# Patient Record
Sex: Female | Born: 1970 | Race: White | Hispanic: No | Marital: Single | Smoking: Current every day smoker
Health system: Southern US, Community
[De-identification: ages and names within clinical notes are randomized; demographics above are authoritative.]

## PROBLEM LIST (undated history)

## (undated) DIAGNOSIS — Z972 Presence of dental prosthetic device (complete) (partial): Secondary | ICD-10-CM

## (undated) DIAGNOSIS — Z9989 Dependence on other enabling machines and devices: Secondary | ICD-10-CM

## (undated) DIAGNOSIS — F329 Major depressive disorder, single episode, unspecified: Secondary | ICD-10-CM

## (undated) DIAGNOSIS — F419 Anxiety disorder, unspecified: Secondary | ICD-10-CM

## (undated) DIAGNOSIS — I10 Essential (primary) hypertension: Secondary | ICD-10-CM

## (undated) DIAGNOSIS — M1990 Unspecified osteoarthritis, unspecified site: Secondary | ICD-10-CM

## (undated) DIAGNOSIS — S82892A Other fracture of left lower leg, initial encounter for closed fracture: Secondary | ICD-10-CM

## (undated) DIAGNOSIS — R519 Headache, unspecified: Secondary | ICD-10-CM

## (undated) DIAGNOSIS — K219 Gastro-esophageal reflux disease without esophagitis: Secondary | ICD-10-CM

## (undated) DIAGNOSIS — J449 Chronic obstructive pulmonary disease, unspecified: Secondary | ICD-10-CM

## (undated) DIAGNOSIS — B182 Chronic viral hepatitis C: Secondary | ICD-10-CM

## (undated) DIAGNOSIS — R0600 Dyspnea, unspecified: Secondary | ICD-10-CM

## (undated) DIAGNOSIS — F32A Depression, unspecified: Secondary | ICD-10-CM

## (undated) DIAGNOSIS — F1910 Other psychoactive substance abuse, uncomplicated: Secondary | ICD-10-CM

## (undated) DIAGNOSIS — M1610 Unilateral primary osteoarthritis, unspecified hip: Secondary | ICD-10-CM

## (undated) DIAGNOSIS — N63 Unspecified lump in unspecified breast: Secondary | ICD-10-CM

## (undated) DIAGNOSIS — F319 Bipolar disorder, unspecified: Secondary | ICD-10-CM

## (undated) DIAGNOSIS — J189 Pneumonia, unspecified organism: Secondary | ICD-10-CM

## (undated) DIAGNOSIS — J45909 Unspecified asthma, uncomplicated: Secondary | ICD-10-CM

## (undated) DIAGNOSIS — H9190 Unspecified hearing loss, unspecified ear: Secondary | ICD-10-CM

## (undated) HISTORY — PX: TUBAL LIGATION: SHX77

---

## 2004-10-16 ENCOUNTER — Emergency Department: Payer: Self-pay | Admitting: Emergency Medicine

## 2005-03-18 ENCOUNTER — Emergency Department: Payer: Self-pay | Admitting: Unknown Physician Specialty

## 2005-10-24 ENCOUNTER — Emergency Department: Payer: Self-pay | Admitting: Emergency Medicine

## 2005-10-24 IMAGING — CR DG ANKLE COMPLETE 3+V*L*
1 series · 5 of 5 positions shown · non-contrast
Comparison: none

REASON FOR EXAM: INJURY
COMMENTS:  LMP: Now

[Series 1: view not recorded · 0.17mm/px · 5 of 5 slices shown]
[im 1/5]
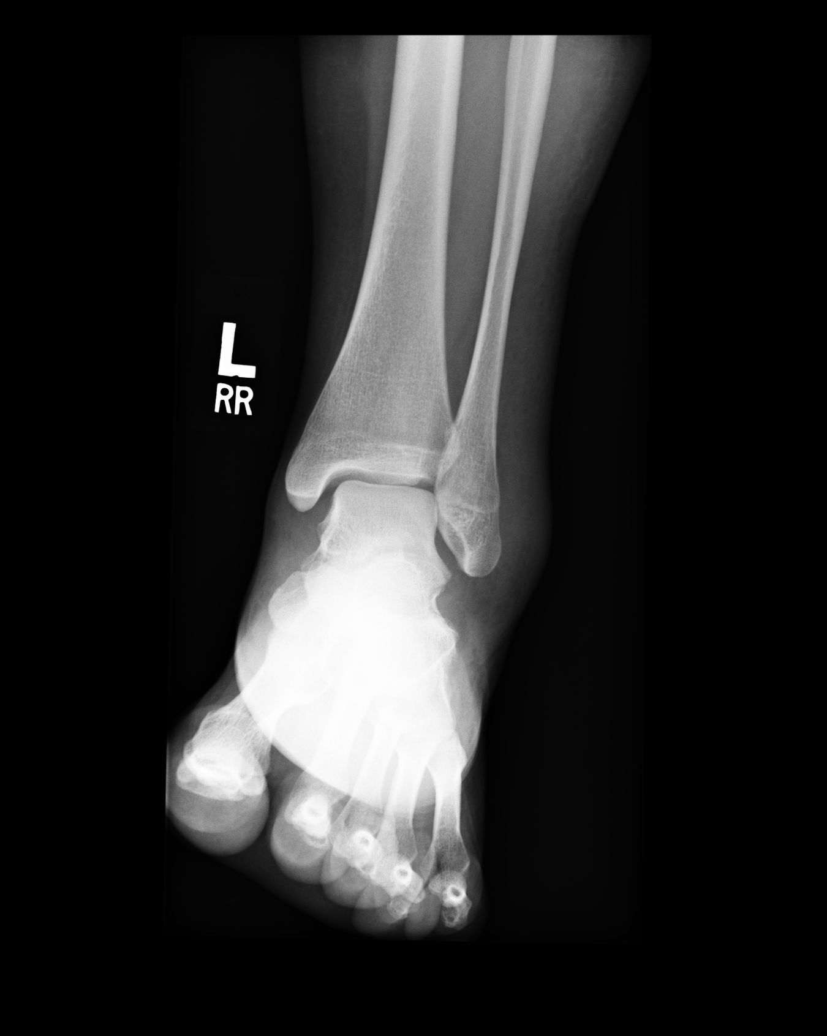
[im 2/5]
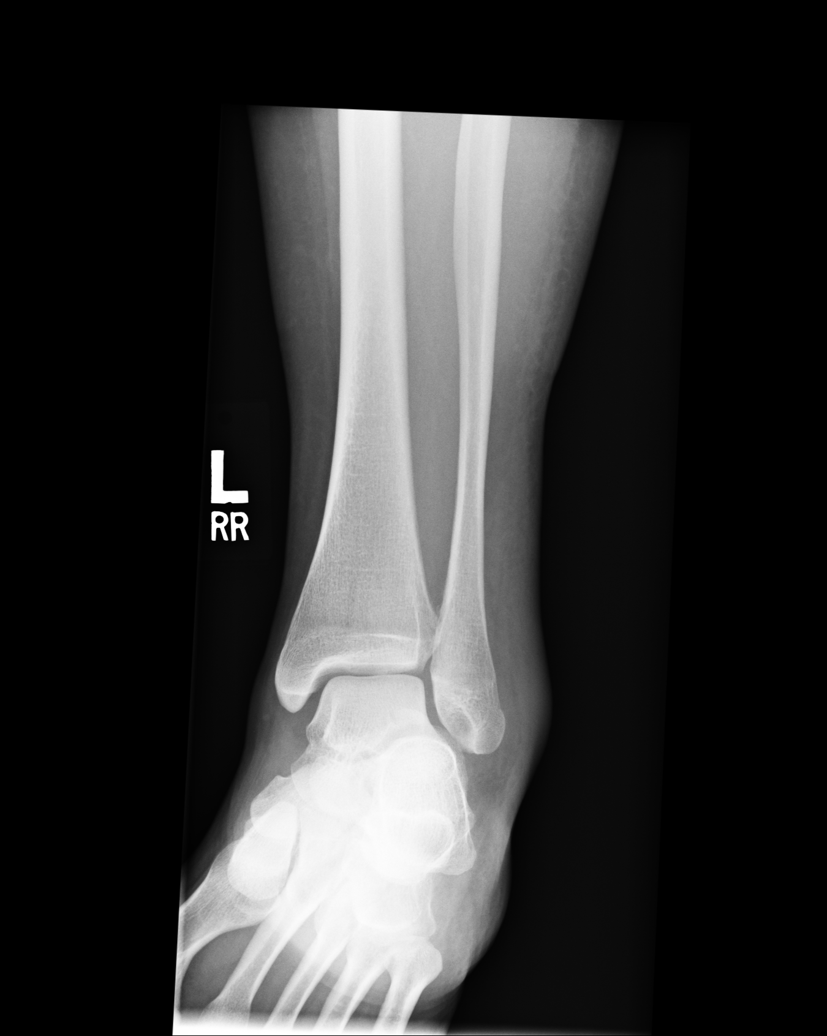
[im 3/5]
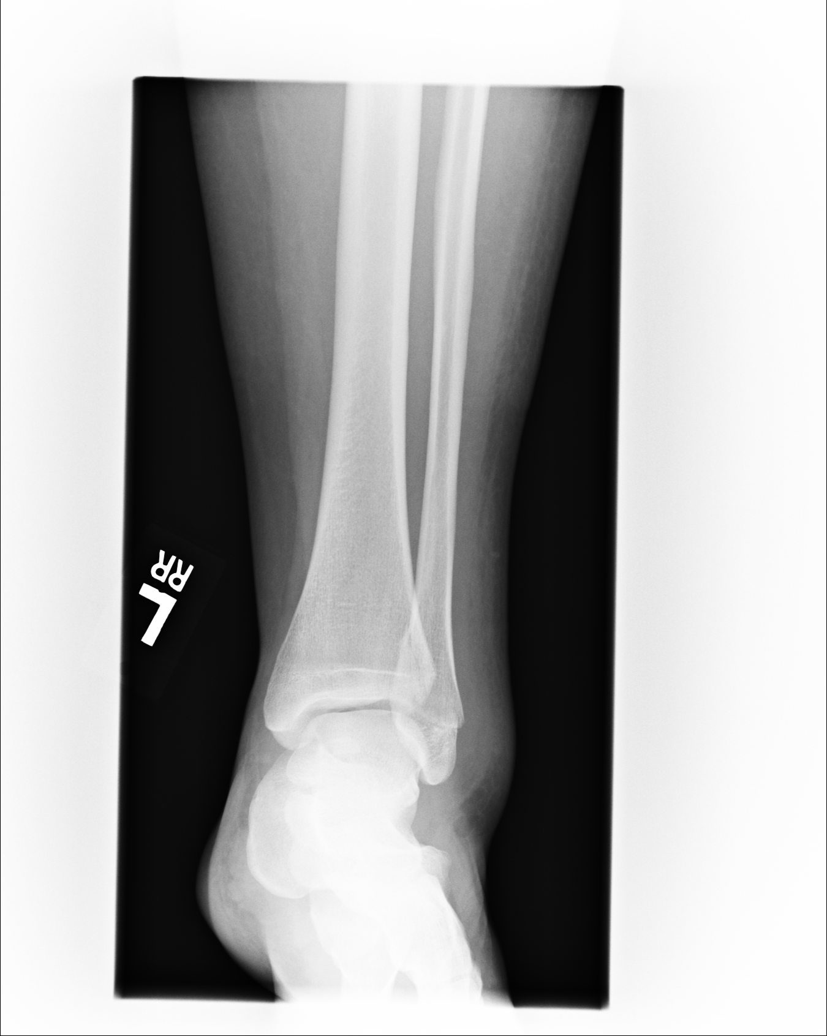
[im 4/5]
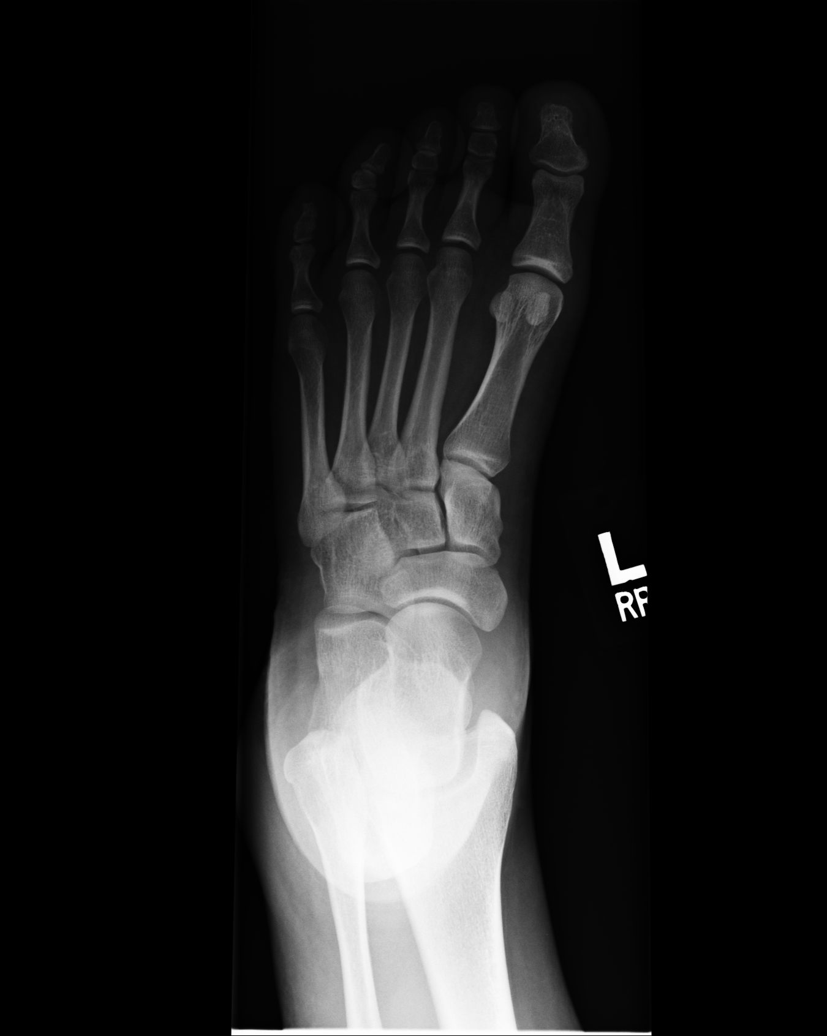
[im 5/5]
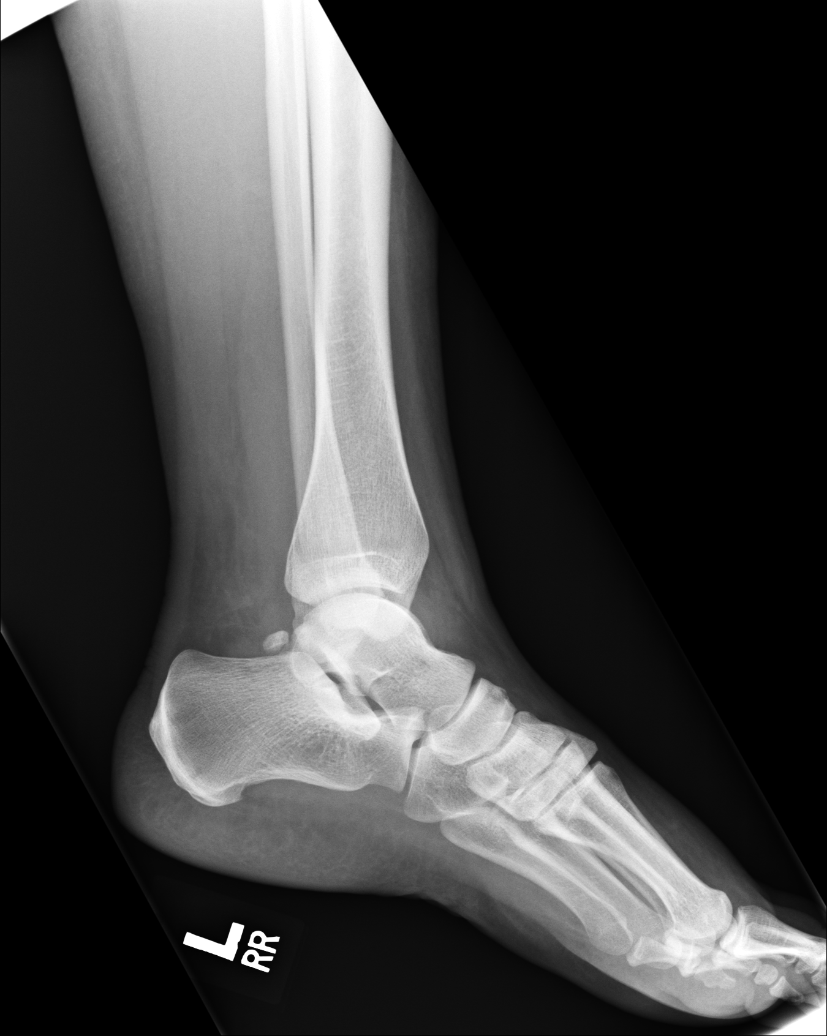

[5 of 5 positions shown; findings below may reference images not displayed]

PROCEDURE:     DXR - DXR ANKLE LEFT COMPLETE  - [DATE]  [DATE]

RESULT:          Three views of the LEFT ankle were obtained.

There is a minimally displaced oblique fracture of the distal LEFT fibula.
No other fractures are seen.  The ankle mortise is well maintained.  There
is prominent soft tissue swelling about the ankle laterally.
IMPRESSION: Fracture of the distal LEFT fibula, minimally
displaced.

## 2007-12-09 ENCOUNTER — Emergency Department: Payer: Self-pay | Admitting: Emergency Medicine

## 2011-11-25 LAB — COMPREHENSIVE METABOLIC PANEL
BUN: 6 mg/dL — ABNORMAL LOW (ref 7–18)
Chloride: 107 mmol/L (ref 98–107)
Co2: 25 mmol/L (ref 21–32)
Creatinine: 0.7 mg/dL (ref 0.60–1.30)
EGFR (African American): >60
EGFR (Non-African Amer.): >60
Potassium: 4 mmol/L (ref 3.5–5.1)
SGOT(AST): 18 U/L (ref 15–37)
SGPT (ALT): 22 U/L (ref 12–78)

## 2011-11-25 LAB — DRUG SCREEN, URINE
Barbiturates, Ur Screen: NEGATIVE (ref ?–200)
Cannabinoid 50 Ng, Ur ~~LOC~~: NEGATIVE (ref ?–50)
Cocaine Metabolite,Ur ~~LOC~~: POSITIVE (ref ?–300)
Methadone, Ur Screen: NEGATIVE (ref ?–300)
Phencyclidine (PCP) Ur S: NEGATIVE (ref ?–25)
Tricyclic, Ur Screen: NEGATIVE (ref ?–1000)

## 2011-11-25 LAB — URINALYSIS, COMPLETE
Bacteria: NONE SEEN
Bilirubin,UR: NEGATIVE
Glucose,UR: NEGATIVE mg/dL (ref 0–75)
Protein: NEGATIVE
Transitional Epi: <1
WBC UR: 32 /HPF (ref 0–5)

## 2011-11-25 LAB — PREGNANCY, URINE: Pregnancy Test, Urine: NEGATIVE m[IU]/mL

## 2011-11-25 LAB — TSH: Thyroid Stimulating Horm: 5.36 u[IU]/mL — ABNORMAL HIGH

## 2011-11-25 LAB — CBC
HCT: 40.4 % (ref 35.0–47.0)
HGB: 13.4 g/dL (ref 12.0–16.0)
MCHC: 33.2 g/dL (ref 32.0–36.0)
Platelet: 333 10*3/uL (ref 150–440)
RBC: 5.25 10*6/uL — ABNORMAL HIGH (ref 3.80–5.20)
RDW: 17 % — ABNORMAL HIGH (ref 11.5–14.5)
WBC: 10.2 10*3/uL (ref 3.6–11.0)

## 2011-11-25 LAB — ETHANOL
Ethanol %: <0.003 % (ref 0.000–0.080)
Ethanol: <3 mg/dL

## 2011-11-25 LAB — SALICYLATE LEVEL: Salicylates, Serum: 3.9 mg/dL — ABNORMAL HIGH

## 2011-11-25 LAB — ACETAMINOPHEN LEVEL: Acetaminophen: <2 ug/mL

## 2011-11-26 ENCOUNTER — Inpatient Hospital Stay: Payer: Self-pay | Admitting: Psychiatry

## 2011-12-08 IMAGING — CR CERVICAL SPINE - 2-3 VIEW
1 series · 4 of 4 positions shown · non-contrast
Comparison: None.

CLINICAL DATA: Pain

EXAM:
CERVICAL SPINE - 2-3 VIEW

[Series 1: w cervical spine lat · 0.14mm/px · 4 of 4 slices shown]
[im 1/4]
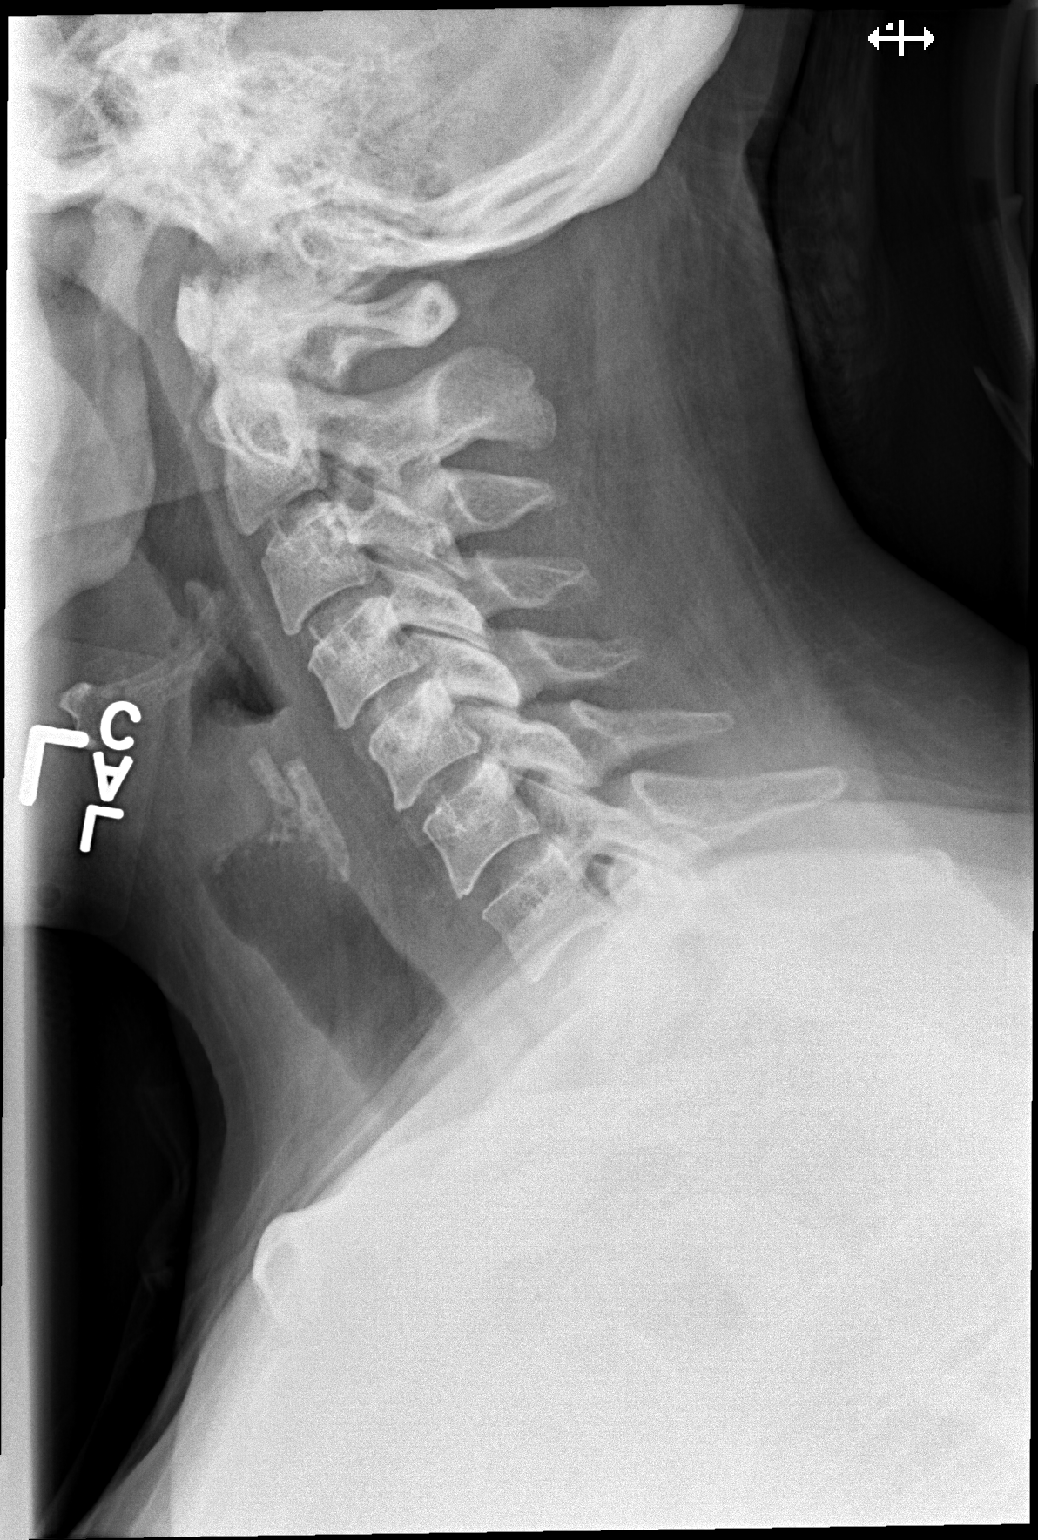
[im 2/4]
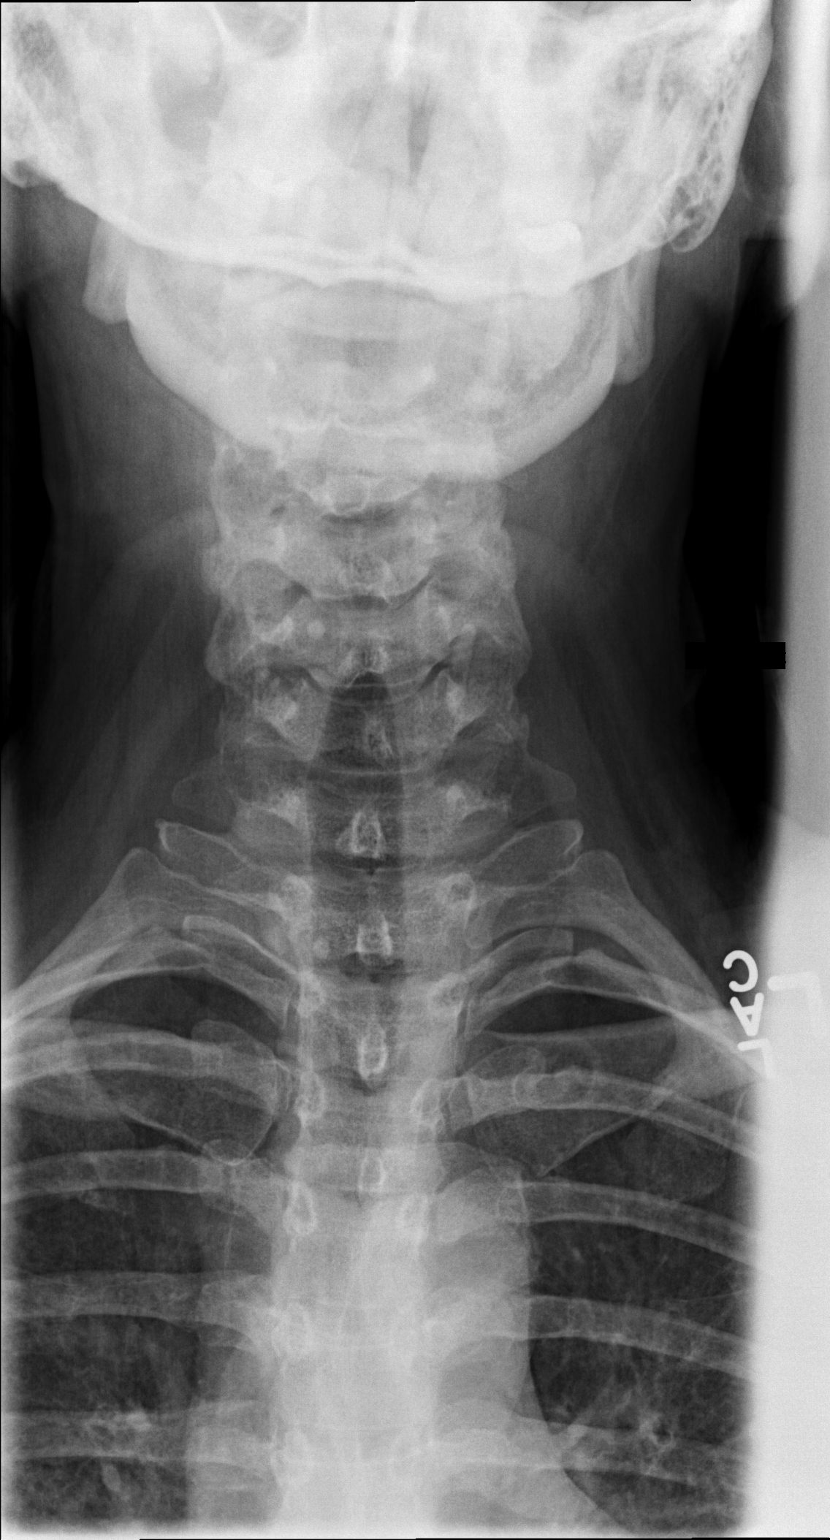
[im 3/4]
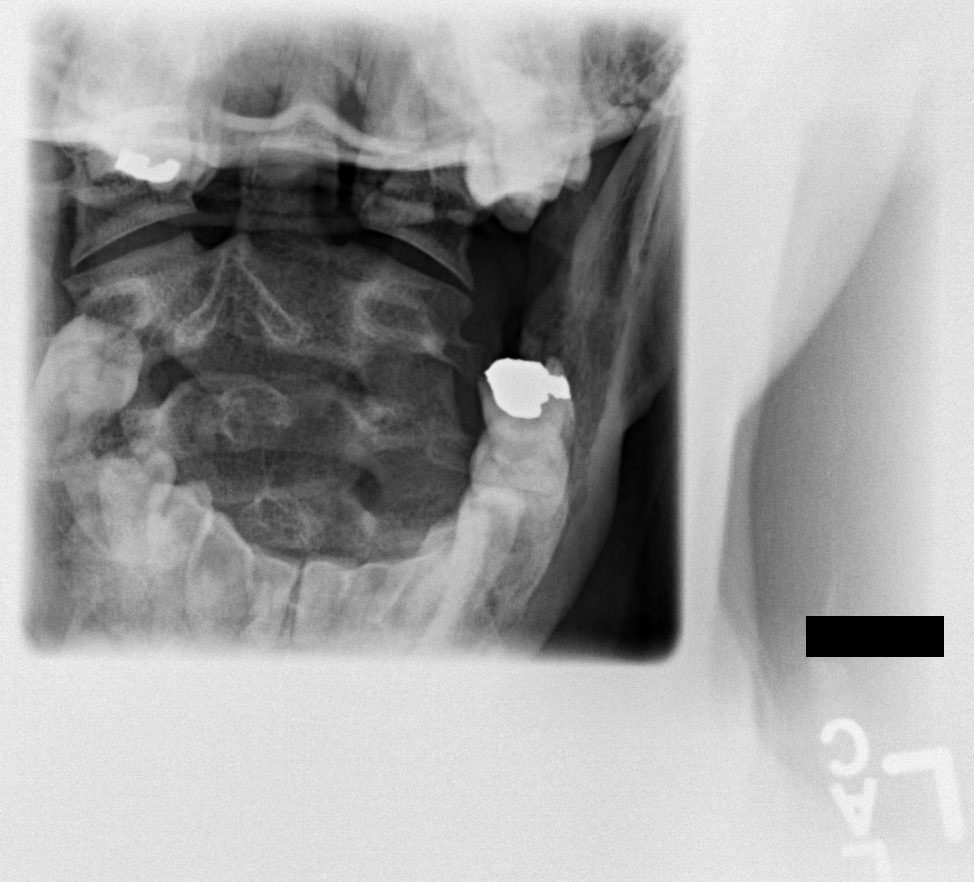
[im 4/4]
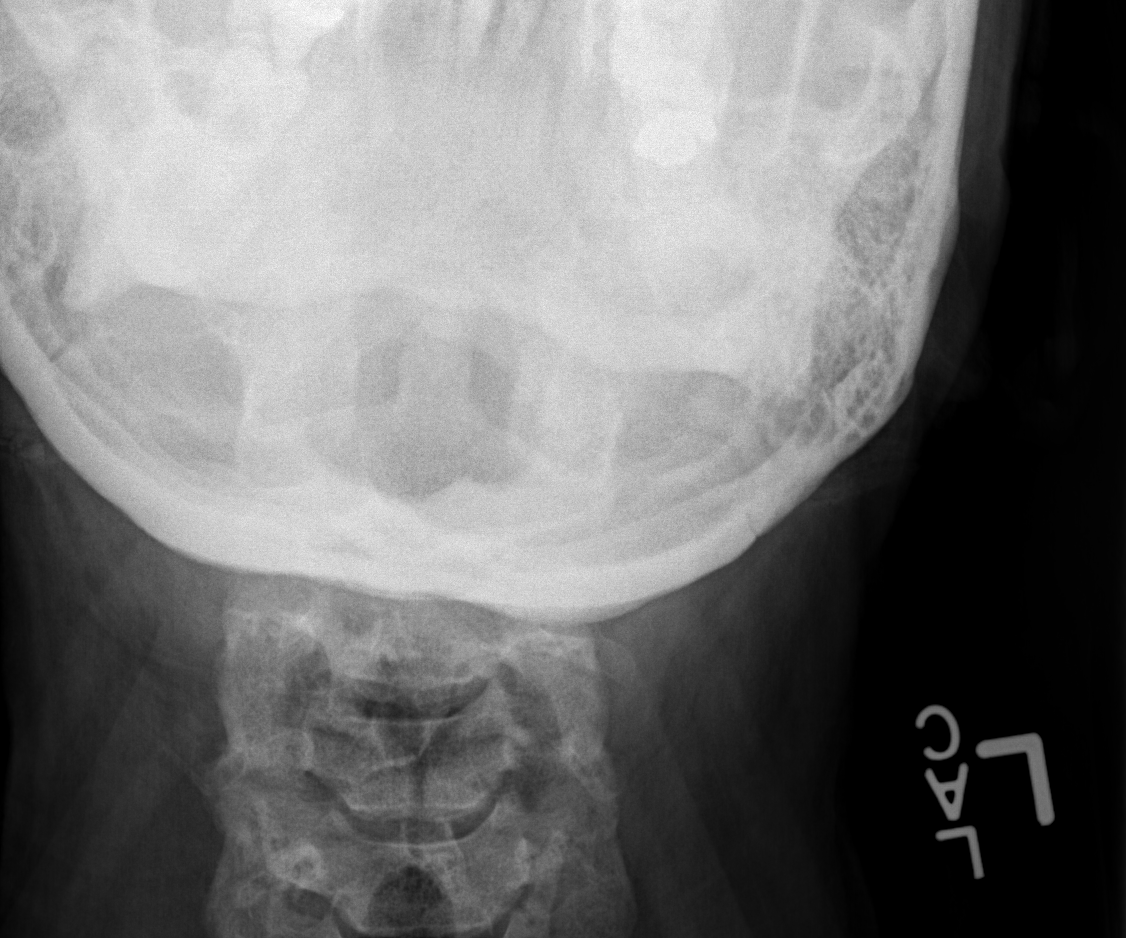

[4 of 4 positions shown; findings below may reference images not displayed]

FINDINGS: No fracture or malalignment. Prevertebral soft tissues are normal.
Disc spaces well maintained. Cervicothoracic junction normal.
IMPRESSION: No acute bony abnormality.

## 2012-10-08 ENCOUNTER — Emergency Department: Payer: Self-pay | Admitting: Emergency Medicine

## 2012-10-08 LAB — COMPREHENSIVE METABOLIC PANEL
Alkaline Phosphatase: 84 U/L (ref 50–136)
Anion Gap: 5 — ABNORMAL LOW (ref 7–16)
BUN: 7 mg/dL (ref 7–18)
Bilirubin,Total: 0.3 mg/dL (ref 0.2–1.0)
Chloride: 108 mmol/L — ABNORMAL HIGH (ref 98–107)
Creatinine: 0.59 mg/dL — ABNORMAL LOW (ref 0.60–1.30)
EGFR (African American): >60
EGFR (Non-African Amer.): >60
Osmolality: 273 (ref 275–301)
SGPT (ALT): 16 U/L (ref 12–78)
Sodium: 138 mmol/L (ref 136–145)

## 2012-10-08 LAB — CBC WITH DIFFERENTIAL/PLATELET
Basophil #: 0.1 10*3/uL (ref 0.0–0.1)
Basophil %: 0.6 %
Eosinophil #: 0.2 10*3/uL (ref 0.0–0.7)
HCT: 37.4 % (ref 35.0–47.0)
MCH: 24.9 pg — ABNORMAL LOW (ref 26.0–34.0)
MCHC: 32.7 g/dL (ref 32.0–36.0)
Monocyte #: 0.8 x10 3/mm (ref 0.2–0.9)
Monocyte %: 7.3 %
Neutrophil #: 8.2 10*3/uL — ABNORMAL HIGH (ref 1.4–6.5)
Neutrophil %: 71.8 %
RDW: 18.4 % — ABNORMAL HIGH (ref 11.5–14.5)
WBC: 11.5 10*3/uL — ABNORMAL HIGH (ref 3.6–11.0)

## 2012-10-08 IMAGING — CR DG LUMBAR SPINE AP/LAT/OBLIQUES W/ FLEX AND EXT
1 series · 5 of 5 positions shown · non-contrast
Comparison: none

REASON FOR EXAM: low back pain gradually wosening
COMMENTS:

PROCEDURE:     DXR - DXR LUMBAR SPINE WITH OBLIQUES  - [DATE]  [DATE]
RESULT:     Lumbar spine series dated [DATE]

[Series 1: t lumbar spine ap · 0.14mm/px · 5 of 5 slices shown]
[im 1/5]
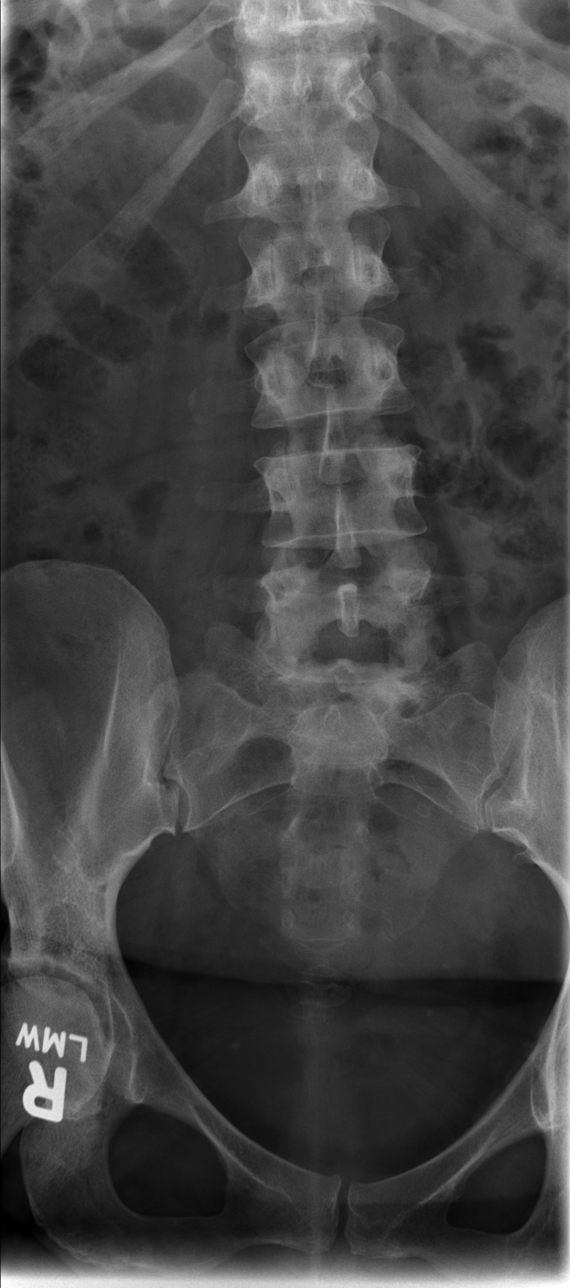
[im 2/5]
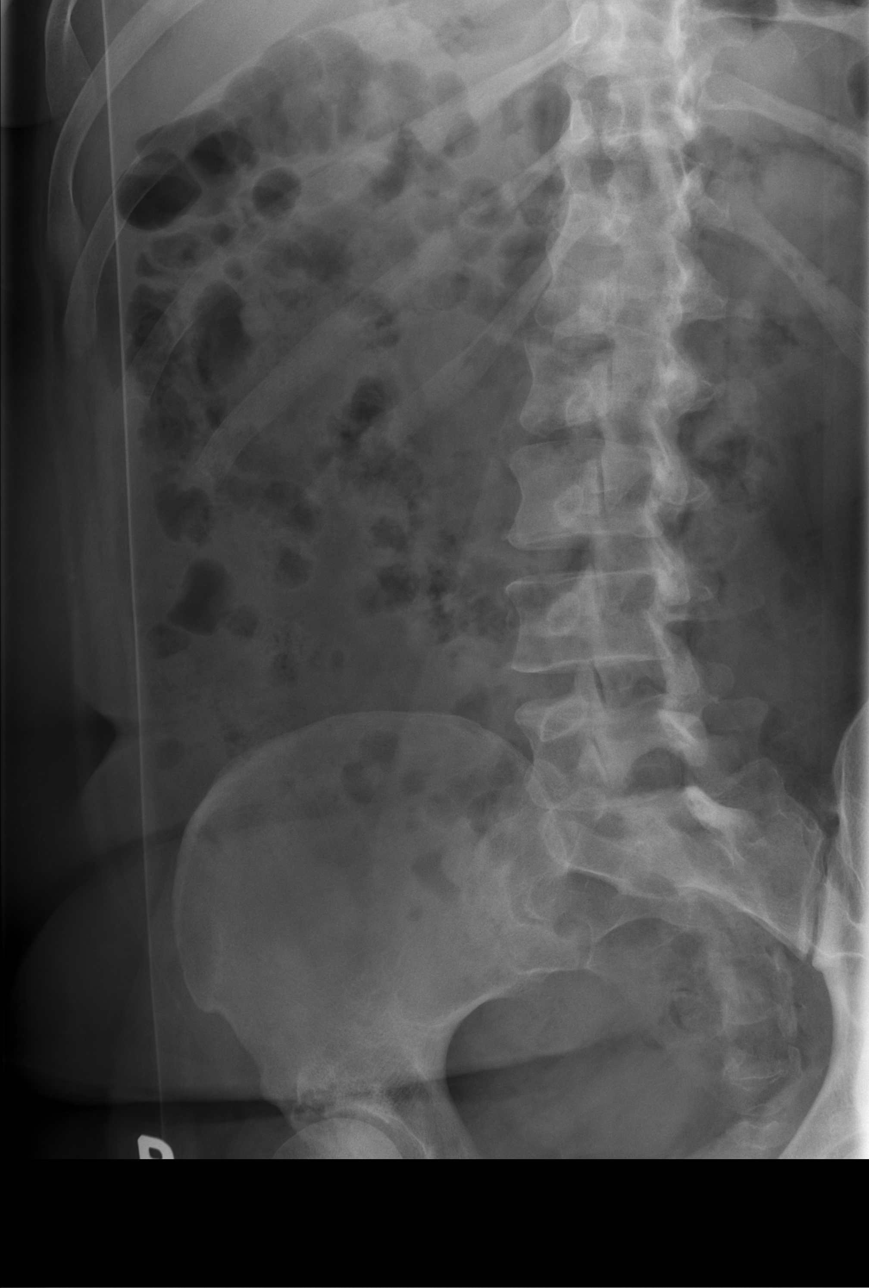
[im 3/5]
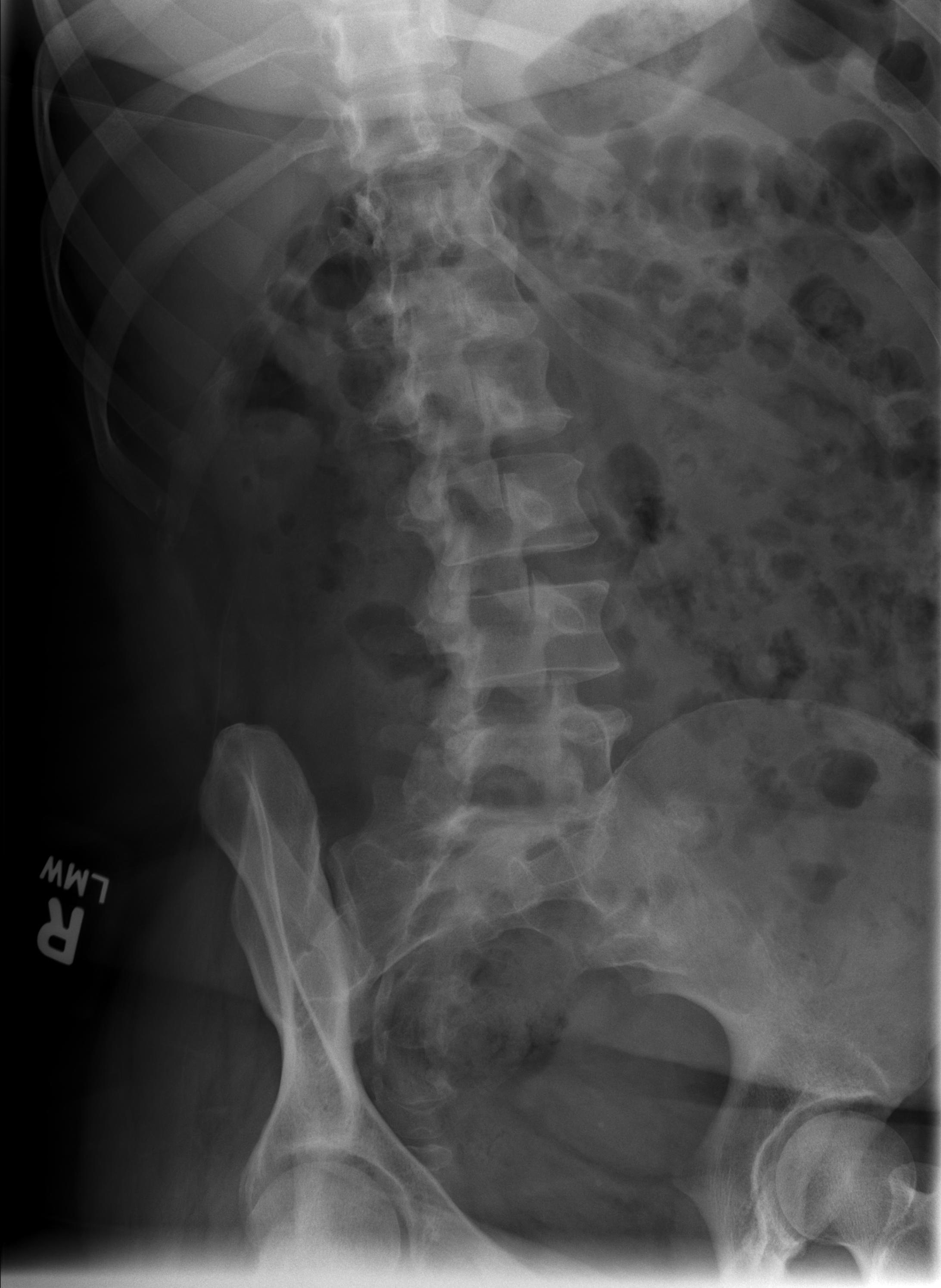
[im 4/5]
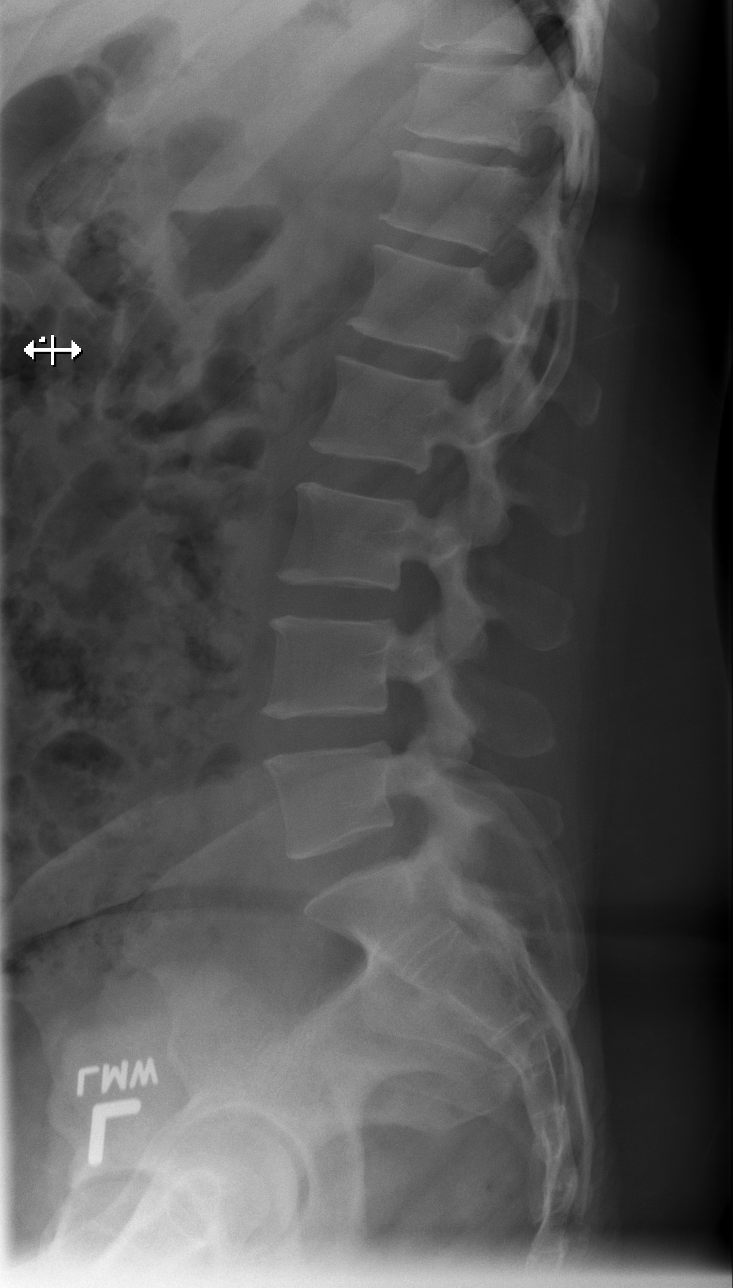
[im 5/5]
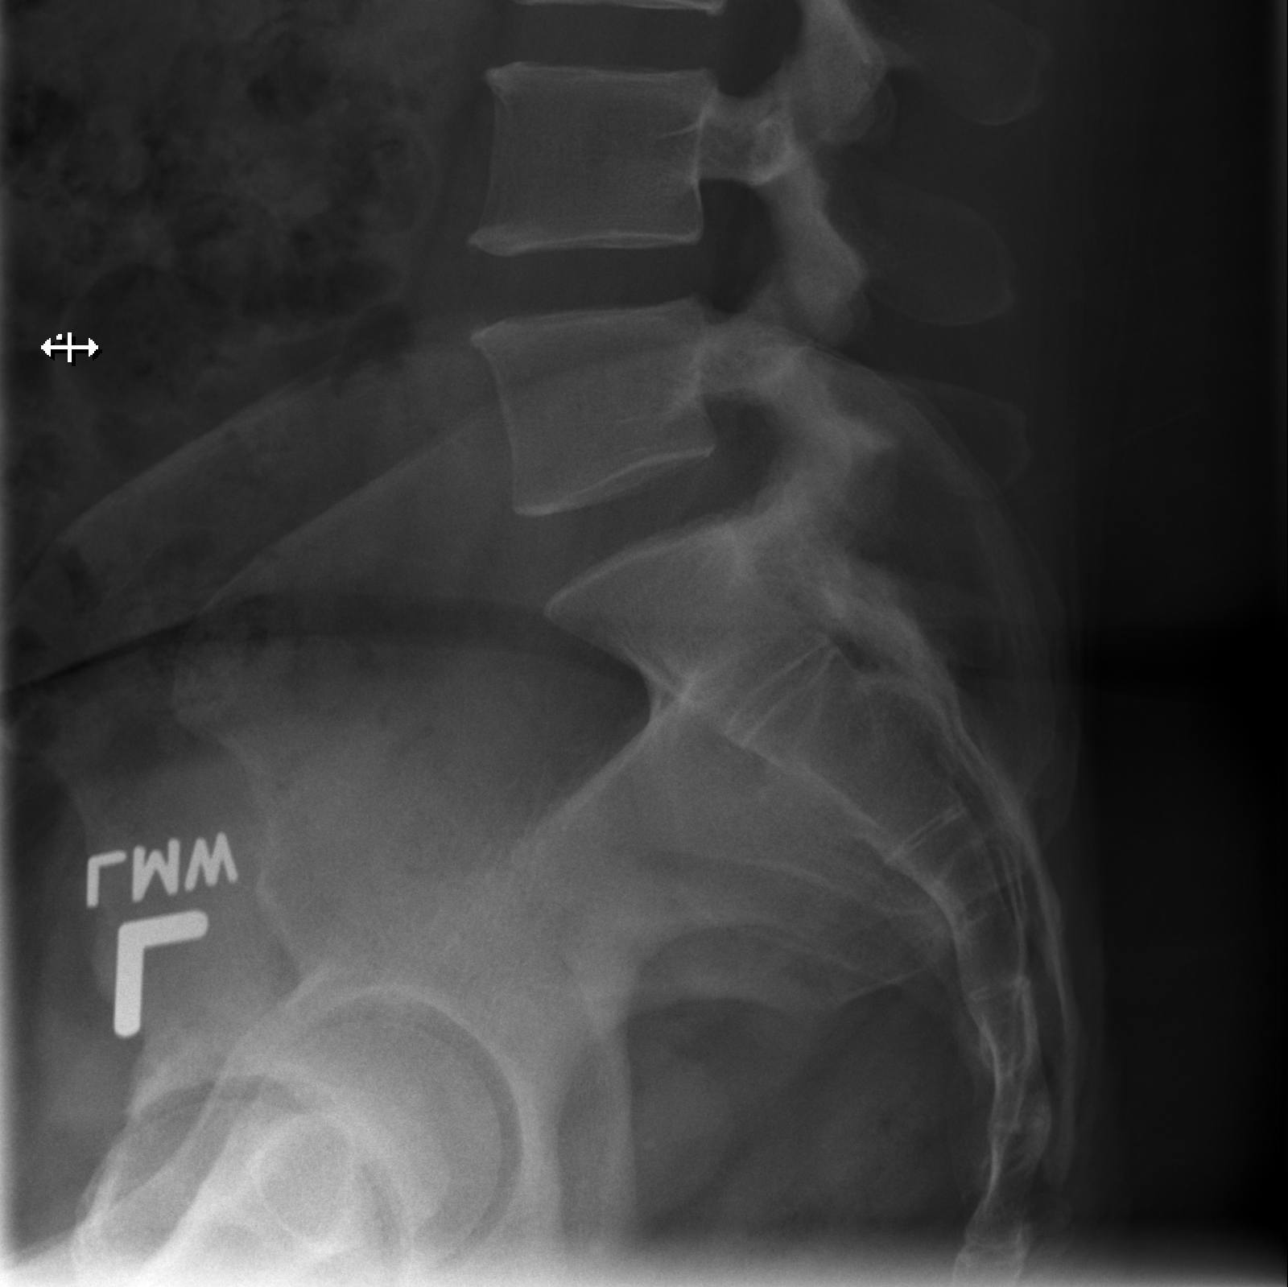

[5 of 5 positions shown; findings below may reference images not displayed]

FINDINGS: There is right lateral tilt of the lumbar spine, minimal, likely
positional. There is otherwise no evidence of acute fracture nor dislocation.
IMPRESSION: No evidence of acute osseous abnormalities.
2. If there is persistent clinical concern repeat evaluation in 7 to 10 days
is recommended.

## 2012-10-08 IMAGING — CR PELVIS - 1-2 VIEW
1 series · 1 of 1 positions shown · non-contrast
Comparison: none

REASON FOR EXAM: low back and bilat hip pain
COMMENTS:

PROCEDURE:     DXR - DXR PELVIS AP ONLY  - [DATE]  [DATE]
RESULT:     There is no evidence of fracture, dislocation, or malalignment.

[t pelvis ap]
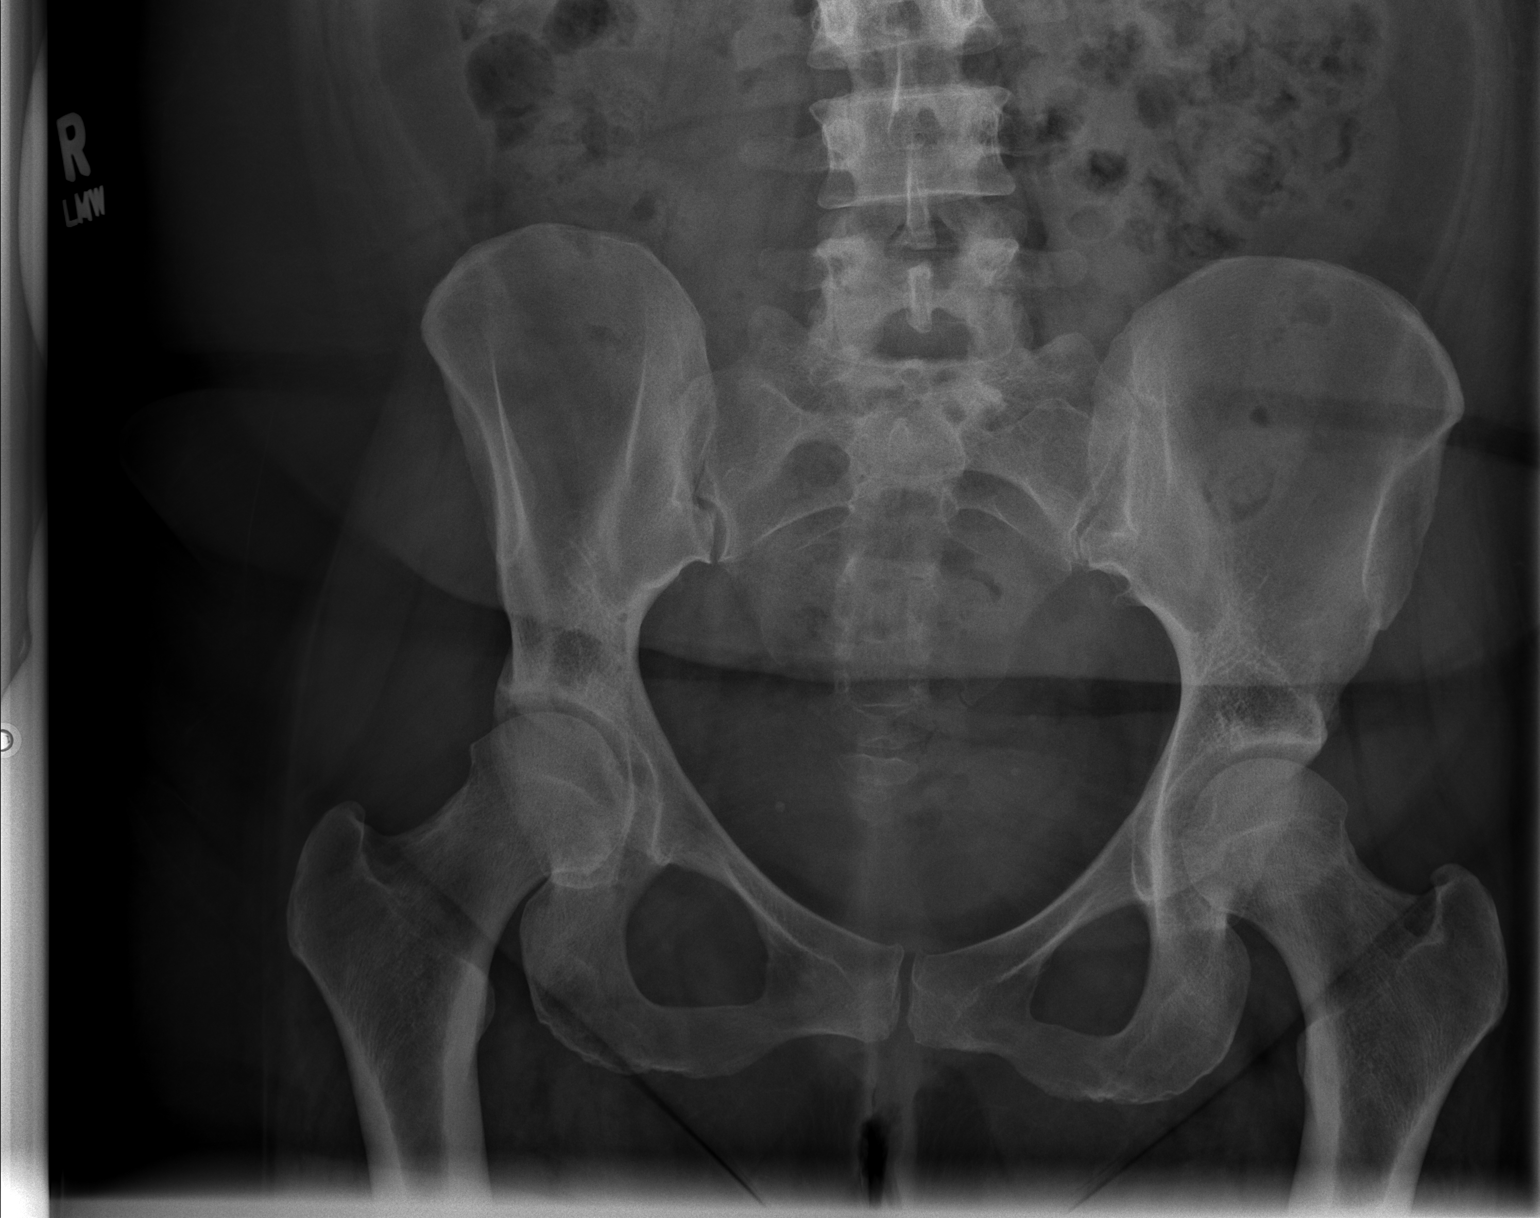

[1 of 1 positions shown; findings below may reference images not displayed]

IMPRESSION: 1. No evidence of acute abnormalities.
2. If there are persistent complaints of pain or persistent clinical
concern, a repeat evaluation in 7-10 days is recommended if clinically
warranted.

## 2013-05-24 ENCOUNTER — Emergency Department: Payer: Self-pay | Admitting: Emergency Medicine

## 2013-05-31 ENCOUNTER — Emergency Department: Payer: Self-pay | Admitting: Emergency Medicine

## 2013-05-31 IMAGING — CR DG ANKLE COMPLETE 3+V*L*
1 series · 3 of 3 positions shown · non-contrast
Comparison: [DATE]

CLINICAL DATA: Ankle twisting.

EXAM:
LEFT ANKLE COMPLETE - 3+ VIEW

[Series 1: x ankle ap left · 0.14mm/px · 3 of 3 slices shown]
[im 1/3]
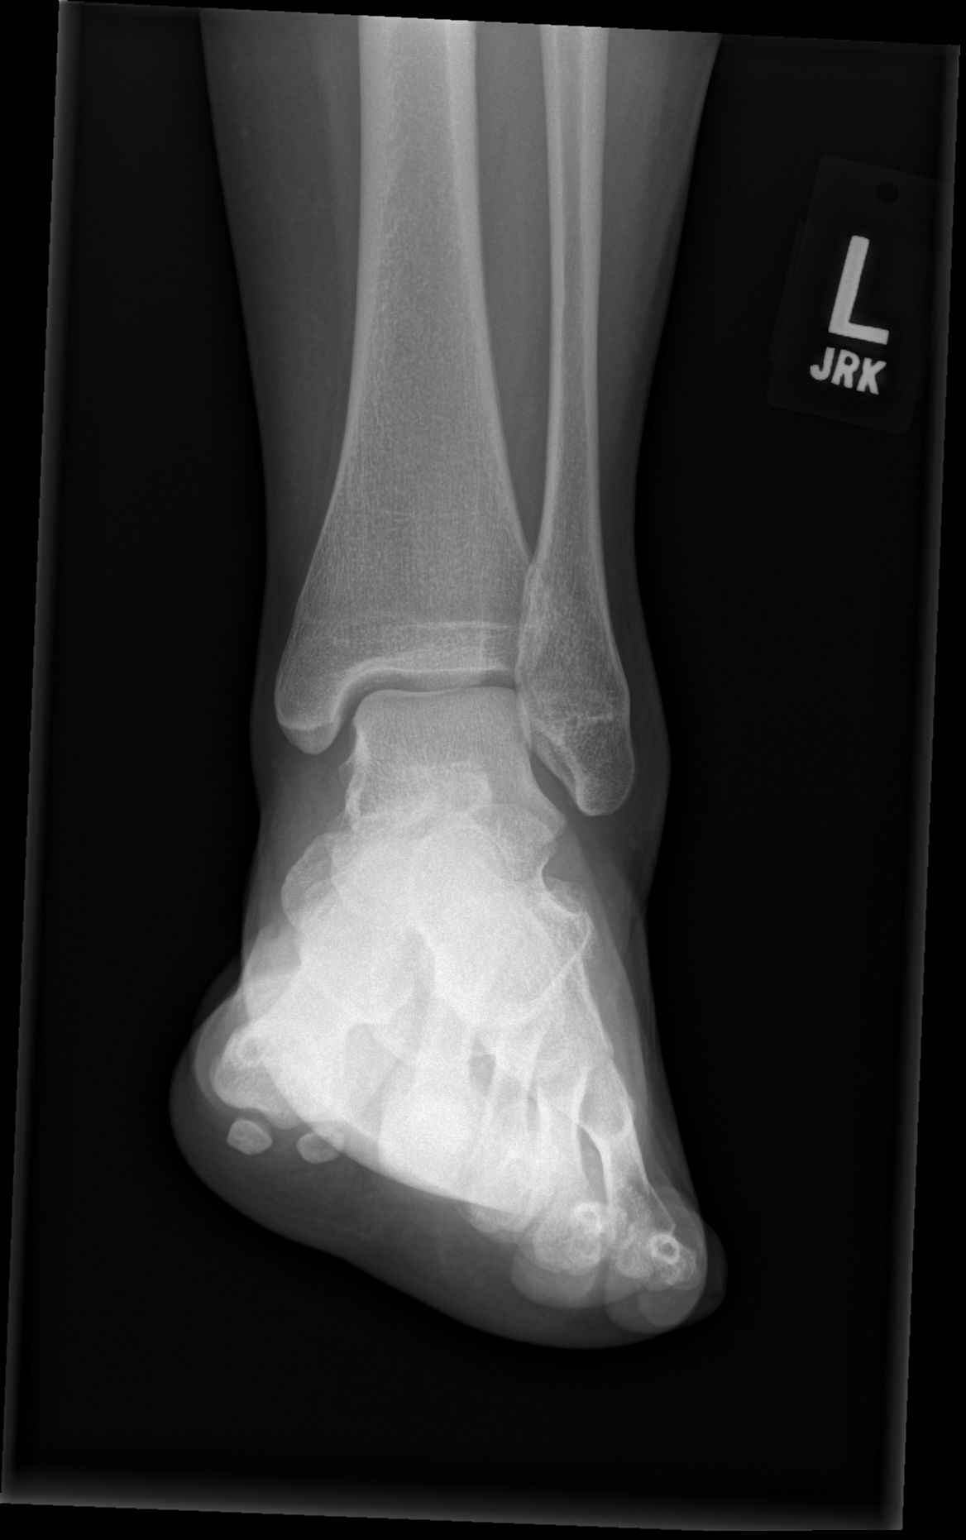
[im 2/3]
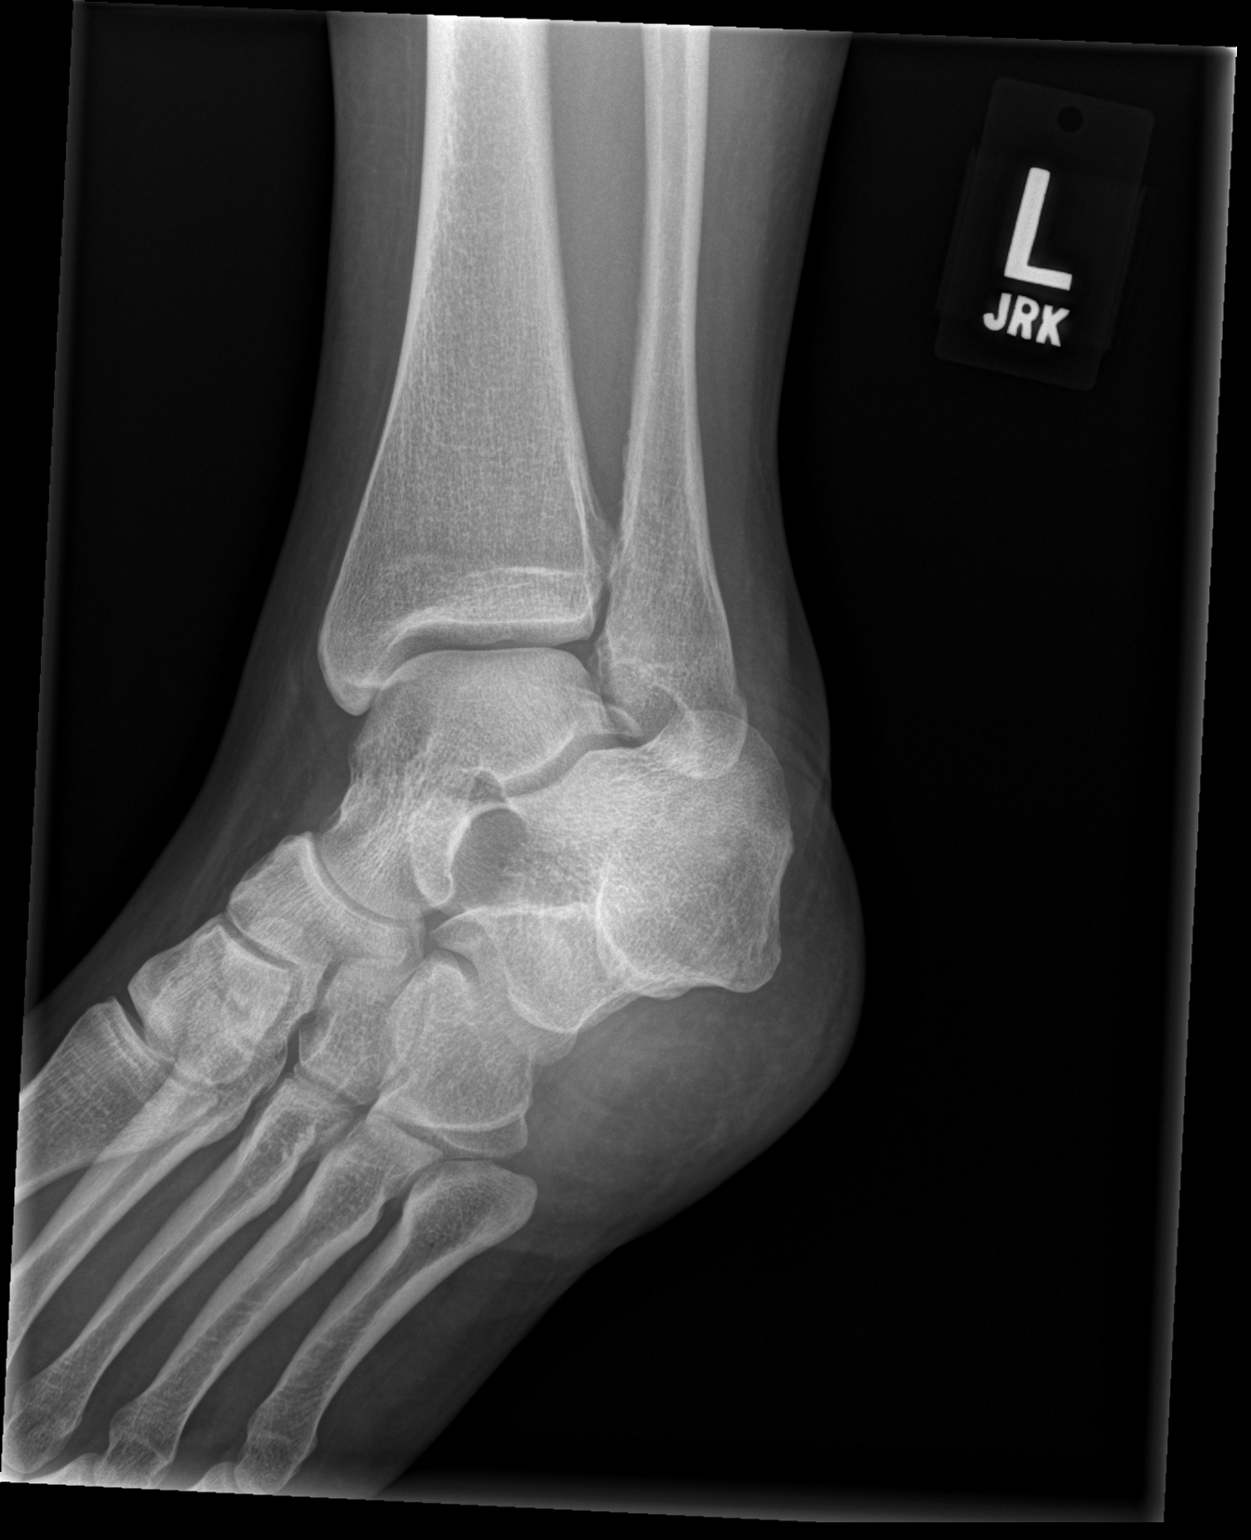
[im 3/3]
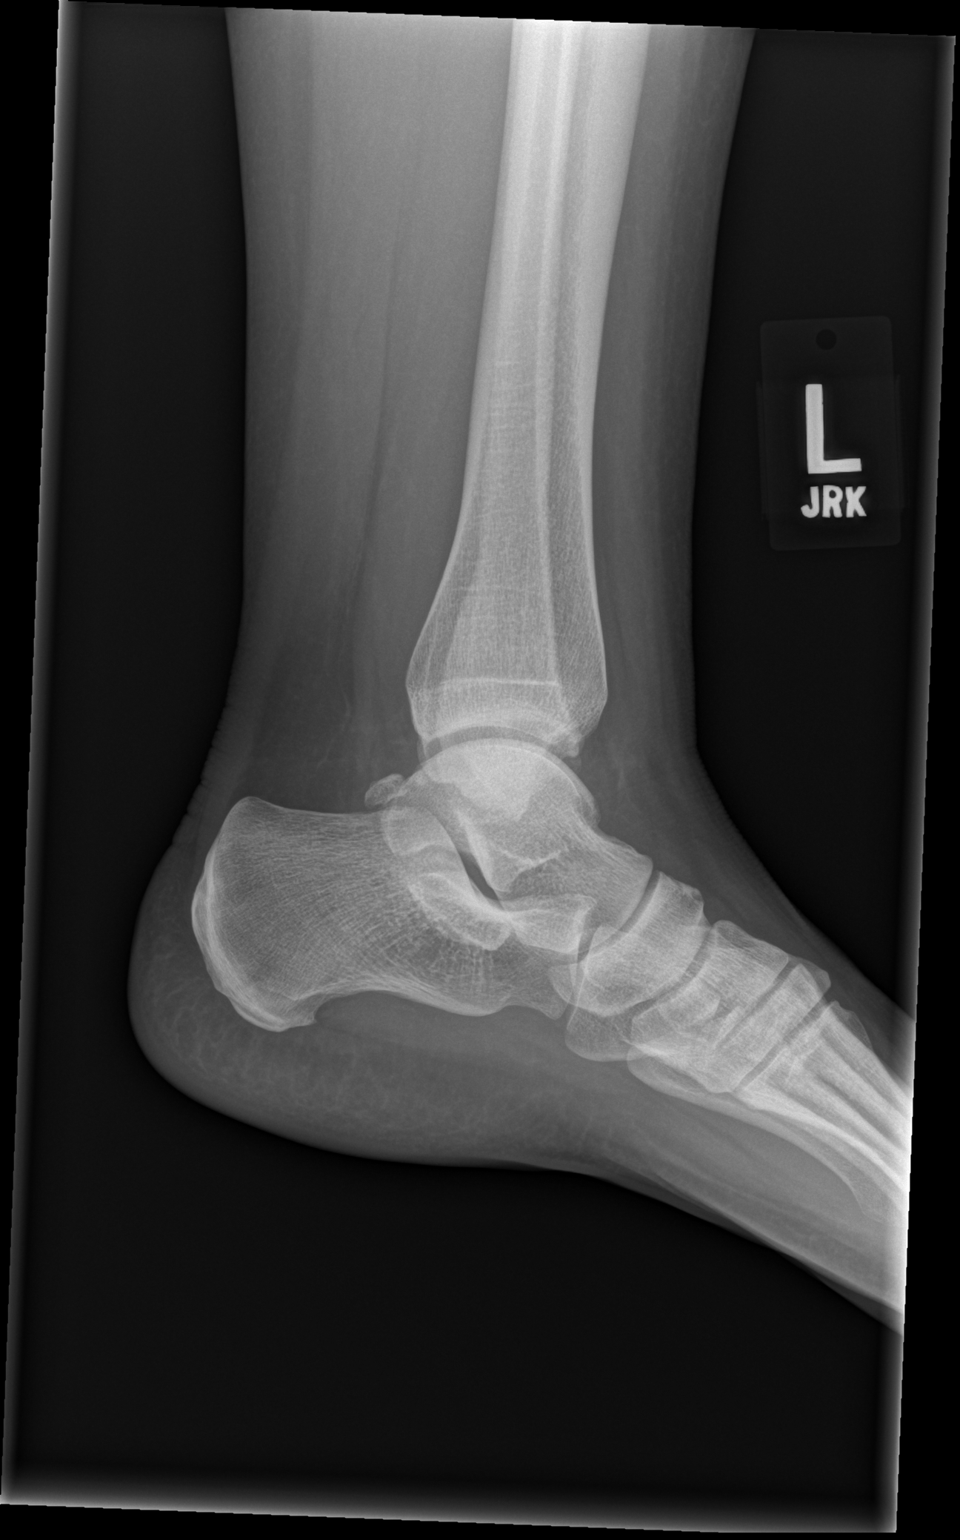

[3 of 3 positions shown; findings below may reference images not displayed]

FINDINGS: There is no evidence of acute fracture, dislocation, or joint
effusion. Mild posttraumatic deformity of the distal fibula.
IMPRESSION: Negative.

## 2013-07-19 ENCOUNTER — Emergency Department: Payer: Self-pay | Admitting: Emergency Medicine

## 2013-07-19 LAB — URINALYSIS, COMPLETE
Bilirubin,UR: NEGATIVE
GLUCOSE, UR: NEGATIVE mg/dL (ref 0–75)
Ketone: NEGATIVE
Leukocyte Esterase: NEGATIVE
Nitrite: NEGATIVE
Ph: 5 (ref 4.5–8.0)
Protein: NEGATIVE
SPECIFIC GRAVITY: 1.001 (ref 1.003–1.030)
Squamous Epithelial: NONE SEEN
WBC UR: 1 /HPF (ref 0–5)

## 2013-07-19 LAB — DRUG SCREEN, URINE
Amphetamines, Ur Screen: NEGATIVE (ref ?–1000)
Barbiturates, Ur Screen: NEGATIVE (ref ?–200)
Benzodiazepine, Ur Scrn: NEGATIVE (ref ?–200)
Cannabinoid 50 Ng, Ur ~~LOC~~: NEGATIVE (ref ?–50)
Cocaine Metabolite,Ur ~~LOC~~: POSITIVE (ref ?–300)
MDMA (Ecstasy)Ur Screen: NEGATIVE (ref ?–500)
Methadone, Ur Screen: NEGATIVE (ref ?–300)
OPIATE, UR SCREEN: NEGATIVE (ref ?–300)
PHENCYCLIDINE (PCP) UR S: NEGATIVE (ref ?–25)
Tricyclic, Ur Screen: NEGATIVE (ref ?–1000)
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
## 2014-05-11 ENCOUNTER — Emergency Department
Admit: 2014-05-11 | Disposition: A | Discharge status: Other Acute Inpatient Hospital | Payer: Self-pay | Admitting: Emergency Medicine

## 2014-05-11 LAB — RAPID HIV SCREEN (HIV 1/2 AB+AG)

## 2014-05-11 LAB — DRUG SCREEN, URINE
Amphetamines, Ur Screen: NEGATIVE
BARBITURATES, UR SCREEN: NEGATIVE
Benzodiazepine, Ur Scrn: NEGATIVE
Cannabinoid 50 Ng, Ur ~~LOC~~: NEGATIVE
Cocaine Metabolite,Ur ~~LOC~~: POSITIVE
MDMA (Ecstasy)Ur Screen: NEGATIVE
Methadone, Ur Screen: NEGATIVE
Opiate, Ur Screen: POSITIVE
Phencyclidine (PCP) Ur S: NEGATIVE
TRICYCLIC, UR SCREEN: NEGATIVE

## 2014-05-11 LAB — COMPREHENSIVE METABOLIC PANEL
AST: 22 U/L
Albumin: 3.8 g/dL
Alkaline Phosphatase: 81 U/L
Anion Gap: 7 (ref 7–16)
Bilirubin,Total: 0.3 mg/dL
CALCIUM: 8.9 mg/dL
CHLORIDE: 106 mmol/L
CREATININE: 0.6 mg/dL
Co2: 23 mmol/L
Glucose: 125 mg/dL — ABNORMAL HIGH
Potassium: 3.8 mmol/L
SGPT (ALT): 17 U/L
Sodium: 136 mmol/L
Total Protein: 8.1 g/dL

## 2014-05-11 LAB — URINALYSIS, COMPLETE
BLOOD: NEGATIVE
Bacteria: NONE SEEN
GLUCOSE, UR: NEGATIVE mg/dL (ref 0–75)
Ketone: NEGATIVE
NITRITE: NEGATIVE
PH: 9 (ref 4.5–8.0)
RBC,UR: NONE SEEN /HPF (ref 0–5)
SPECIFIC GRAVITY: 1.019 (ref 1.003–1.030)

## 2014-05-11 LAB — CBC
HCT: 37.4 % (ref 35.0–47.0)
HGB: 11.8 g/dL — ABNORMAL LOW (ref 12.0–16.0)
MCH: 20.2 pg — ABNORMAL LOW (ref 26.0–34.0)
MCHC: 31.5 g/dL — ABNORMAL LOW (ref 32.0–36.0)
MCV: 64 fL — ABNORMAL LOW (ref 80–100)
PLATELETS: 393 10*3/uL (ref 150–440)
RBC: 5.84 10*6/uL — ABNORMAL HIGH (ref 3.80–5.20)
RDW: 20.6 % — AB (ref 11.5–14.5)
WBC: 9.4 10*3/uL (ref 3.6–11.0)

## 2014-05-11 LAB — SALICYLATE LEVEL: Salicylates, Serum: <4 mg/dL

## 2014-05-11 LAB — ACETAMINOPHEN LEVEL: Acetaminophen: <10 ug/mL

## 2014-05-11 LAB — ETHANOL: Ethanol: <5 mg/dL

## 2014-05-12 LAB — PREGNANCY, URINE: Pregnancy Test, Urine: NEGATIVE m[IU]/mL
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
## 2014-05-28 NOTE — Consult Note (Addendum)
PATIENT NAME:  Brenda [name], Brenda [name] MR#:  [id number] DATE OF BIRTH:  02-02-1970  DATE OF CONSULTATION:  05/11/2014  REFERRING PHYSICIAN:   CONSULTING PHYSICIAN:  Audery Amel, MD  IDENTIFYING INFORMATION AND REASON FOR CONSULTATION:  This a 44 year old woman with a history of substance abuse who presents to the Emergency Room with the chief complaints "heroin withdrawal".    HISTORY OF PRESENT ILLNESS:  Information from the patient and the chart.  The patient says she has been using intravenous heroin for about a year.  She uses what she calls "10 bags" per day.  Last used some yesterday morning.  Also admits that she continues to occasionally use cocaine.  Denies alcohol use.  Mood has been feeling very sad and down.  Sleeping poorly.  Poor appetite.  Major stress not only from substance abuse but from being homeless.  The patient feels overwhelmed and is seeking detoxification treatment.  She said that she had made statements earlier about how she would rather be dead than to go on feeling like this.  She denied having any actual intention or plan of killing herself.   PAST PSYCHIATRIC HISTORY:  The patient was admitted here in late 2013 for cocaine abuse and depression.  She was treated with Prozac at that time.  She tells me that she has cut down on her cocaine use and has been using heroin now for a little over a year.  She is not currently taking any other psychiatric medicine.  She denies that she has ever tried to kill herself.  Denies any history of violence.   SOCIAL HISTORY:  She describes herself as being "between place to live" right now which means she is homeless.  She was thrown out of her most recent place and is trying to find various ex-husbands and another people she knows to let her stay.  Not working.  Estranged relationship with all of her family.   PAST MEDICAL HISTORY:  Says she has asthma and uses Qvar and a ProAir inhaler, also Prevacid for gastric reflux.   FAMILY  HISTORY:  Reports an extensive family history of substance abuse problems.    SUBSTANCE ABUSE HISTORY:  History of cocaine use in 2013.  She says she has cut down on that and now is just using opiates mostly.  Never been in Education officer, environmental) << MISSING TEXT>> never been to any kind of inpatient rehab program, not going to any outpatient treatment.   CURRENT MEDICATIONS:  Qvar and ProAir inhalers.   ALLERGIES:  NO KNOWN DRUG ALLERGIES.   REVIEW OF SYSTEMS:  Says she is feeling achy and pained all over.  Sick to her stomach. Cramping in the abdomen.  Feels like she is having withdrawal.  Mood is feeling bad.  No hallucinations, no suicidal ideation.   MENTAL STATUS EXAMINATION:  Disheveled woman, looks older than her stated age. Passively cooperative.  Eye contact intermittent.  Psychomotor activity fidgety.  Speech decreased in total amount.  Affect is irritable and dysphoric.  Mood is stated as being bad. Thoughts lucid.  No evidence of loosening of associations or delusions.  Denies auditory or visual hallucinations.  Registers 3 out of 3 objects immediately.  Remembers 3 out of 3 at 3 minutes.  Judgment and insight reasonably intact.  Alert and oriented x 4.   LABORATORY RESULTS:  Salicylates, acetaminophen and alcohol all negative.  Chemistry panel, elevated glucose 125.  CBC shows a low hemoglobin 11.8, elevated RBC count 5.84.  Bilirubin 2+  positive in the urine.  No sign of infection.  Drug screen positive for cocaine and opiates.   VITAL SIGNS:  Blood pressure 120/81, respirations 18, pulse 86, temperature 98.   ASSESSMENT:  A 44 year old woman reporting heroin abuse and acute withdrawal.  Feeling a lot of discomfort and distress over it.  Mood is bad.  Not psychotic.  The patient does not necessarily meet criteria for inpatient psychiatric treatment but is in quite a bit of discomfort right now.  Would benefit from some medical stabilization.   TREATMENT PLAN:  Single dose of Suboxone  given now and I will give her standing Suboxone for the next couple days at most if she is here.  I have talked with the ER psychiatry staff and suggested we try referring her to [facility] or to ([facility]) << MISSING TEXT>> Case discussed with Emergency Room physician.  Re-evaluate tomorrow.  I am also going to go ahead and order hepatitis panel and an HIV test and she says she has never had those.   DIAGNOSIS, PRINCIPAL AND PRIMARY:  AXIS I:  Opiate withdrawal.   SECONDARY DIAGNOSES:  AXIS I:  Opiate abuse, severe.  AXIS II: Deferred.  AXIS III: Asthma.      ____________________________ Audery AmelJohn T. Samadhi Mahurin, MD jtc:NT D: 05/11/2014 16:40:21 ET T: 05/11/2014 17:08:01 ET JOB#: [id number]  cc: Audery AmelJohn T. Donnarae Rae, MD, <Dictator> Audery AmelJOHN T Elsbeth Yearick MD ELECTRONICALLY SIGNED 06/02/2014 10:11

## 2015-02-02 ENCOUNTER — Other Ambulatory Visit: Payer: Self-pay | Admitting: Nurse Practitioner

## 2015-02-02 DIAGNOSIS — Z1231 Encounter for screening mammogram for malignant neoplasm of breast: Secondary | ICD-10-CM

## 2015-02-13 ENCOUNTER — Ambulatory Visit: Payer: Self-pay | Attending: Nurse Practitioner

## 2015-03-05 ENCOUNTER — Other Ambulatory Visit: Payer: Self-pay | Admitting: Nurse Practitioner

## 2015-03-05 DIAGNOSIS — N912 Amenorrhea, unspecified: Secondary | ICD-10-CM

## 2015-03-06 ENCOUNTER — Ambulatory Visit
Admission: RE | Admit: 2015-03-06 | Discharge: 2015-03-06 | Disposition: A | Discharge status: Home / Self Care | Payer: Medicaid Other | Source: Ambulatory Visit | Attending: Nurse Practitioner | Admitting: Nurse Practitioner

## 2015-03-06 DIAGNOSIS — Z1231 Encounter for screening mammogram for malignant neoplasm of breast: Secondary | ICD-10-CM | POA: Insufficient documentation

## 2015-03-06 IMAGING — MG MM DIGITAL SCREENING BILAT W/ CAD
4 series · 4 of 4 positions shown · non-contrast
Comparison: None.

CLINICAL DATA: Screening.

EXAM:
DIGITAL SCREENING BILATERAL MAMMOGRAM WITH CAD

[R MLO]
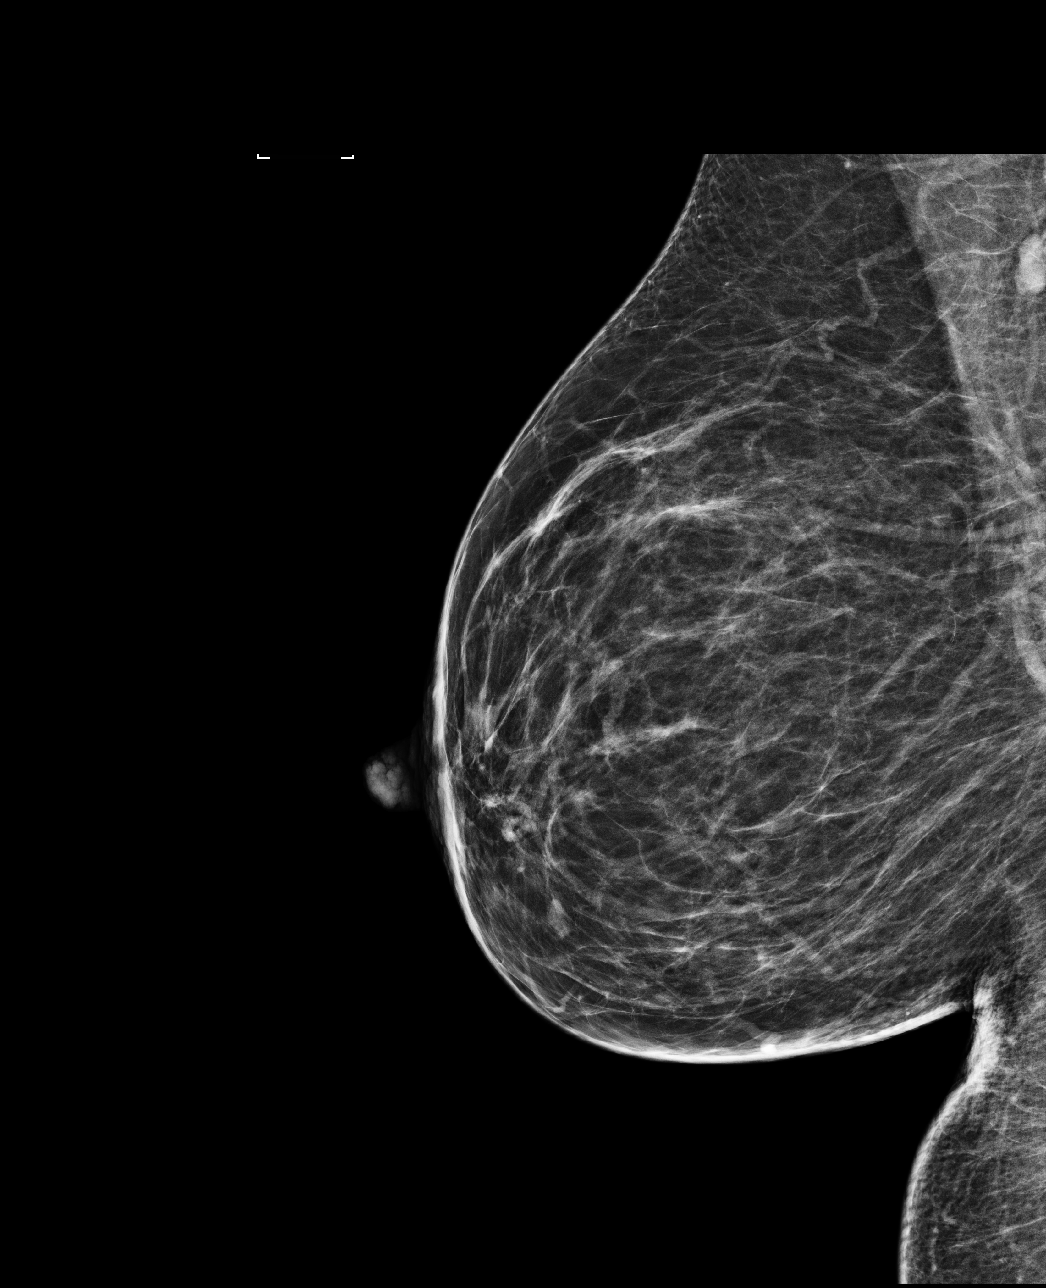

[R CC]
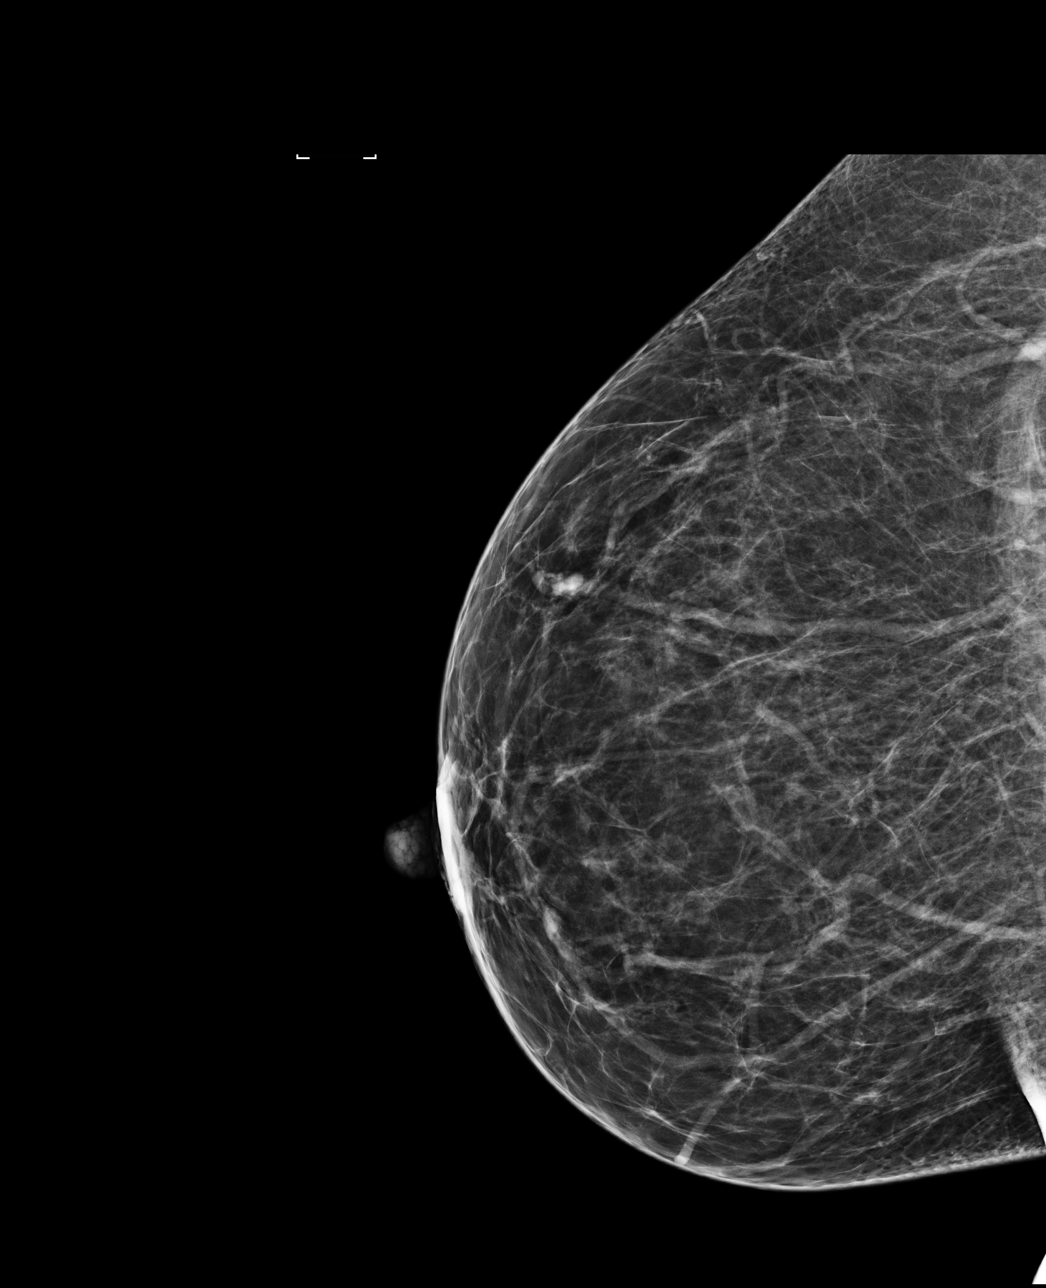

[L CC]
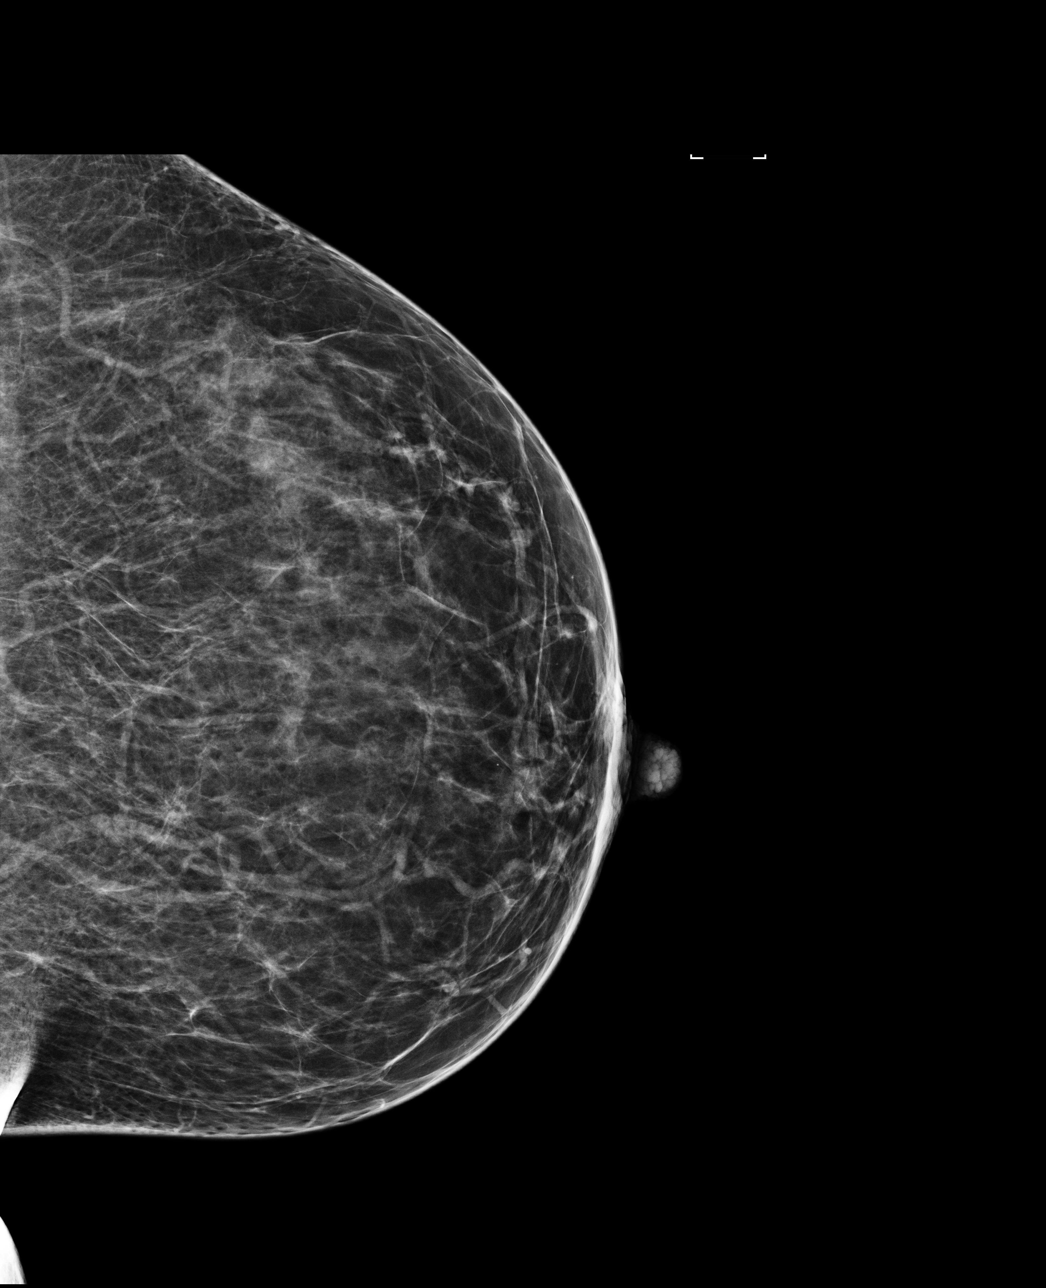

[L MLO]
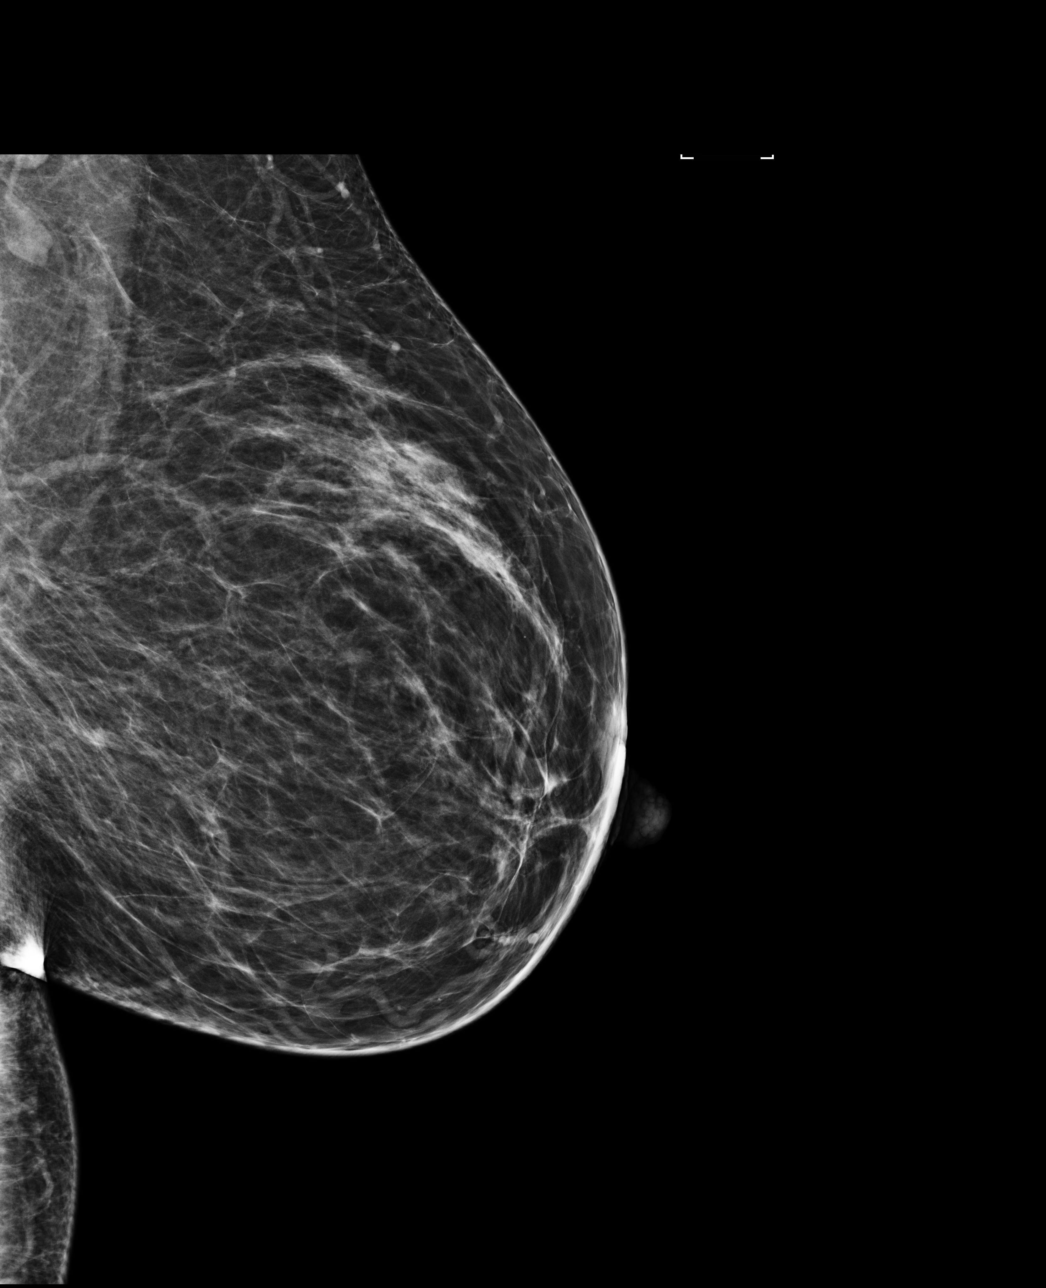

[4 of 4 positions shown; findings below may reference images not displayed]

ACR Breast Density Category b: There are scattered areas of
fibroglandular density.
FINDINGS: There are no findings suspicious for malignancy. Images were
processed with CAD.
IMPRESSION: No mammographic evidence of malignancy. A result letter of this
screening mammogram will be mailed directly to the patient.

RECOMMENDATION:
Screening mammogram in one year. (Code:[GD])

BI-RADS CATEGORY  1: Negative.

## 2015-03-12 ENCOUNTER — Ambulatory Visit: Payer: Medicaid Other | Attending: Nurse Practitioner

## 2016-02-02 ENCOUNTER — Emergency Department
Admission: EM | Admit: 2016-02-02 | Discharge: 2016-02-02 | Disposition: A | Discharge status: Home / Self Care | Payer: No Typology Code available for payment source | Attending: Emergency Medicine | Admitting: Emergency Medicine

## 2016-02-02 DIAGNOSIS — T699XXA Effect of reduced temperature, unspecified, initial encounter: Secondary | ICD-10-CM

## 2016-02-02 DIAGNOSIS — X31XXXA Exposure to excessive natural cold, initial encounter: Secondary | ICD-10-CM | POA: Diagnosis not present

## 2016-02-02 DIAGNOSIS — F17210 Nicotine dependence, cigarettes, uncomplicated: Secondary | ICD-10-CM | POA: Diagnosis not present

## 2016-02-02 DIAGNOSIS — Y92410 Unspecified street and highway as the place of occurrence of the external cause: Secondary | ICD-10-CM | POA: Diagnosis not present

## 2016-02-02 DIAGNOSIS — Y939 Activity, unspecified: Secondary | ICD-10-CM | POA: Insufficient documentation

## 2016-02-02 DIAGNOSIS — Y999 Unspecified external cause status: Secondary | ICD-10-CM | POA: Diagnosis not present

## 2016-02-02 DIAGNOSIS — S0990XA Unspecified injury of head, initial encounter: Secondary | ICD-10-CM | POA: Insufficient documentation
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
## 2016-02-02 NOTE — Discharge Instructions (Signed)
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
## 2016-02-02 NOTE — ED Notes (Signed)

## 2016-05-01 ENCOUNTER — Emergency Department: Payer: Self-pay

## 2016-05-01 ENCOUNTER — Emergency Department
Admission: EM | Admit: 2016-05-01 | Discharge: 2016-05-01 | Disposition: A | Discharge status: Home / Self Care | Payer: Self-pay | Attending: Emergency Medicine | Admitting: Emergency Medicine

## 2016-05-01 ENCOUNTER — Encounter: Payer: Self-pay | Admitting: Emergency Medicine

## 2016-05-01 ENCOUNTER — Inpatient Hospital Stay
Admission: AD | Admit: 2016-05-01 | Discharge: 2016-05-04 | DRG: 885 | Disposition: A | Discharge status: Home / Self Care | Payer: No Typology Code available for payment source | Source: Intra-hospital | Attending: Psychiatry | Admitting: Psychiatry

## 2016-05-01 DIAGNOSIS — K219 Gastro-esophageal reflux disease without esophagitis: Secondary | ICD-10-CM | POA: Diagnosis present

## 2016-05-01 DIAGNOSIS — S61512A Laceration without foreign body of left wrist, initial encounter: Secondary | ICD-10-CM | POA: Insufficient documentation

## 2016-05-01 DIAGNOSIS — R51 Headache: Secondary | ICD-10-CM | POA: Insufficient documentation

## 2016-05-01 DIAGNOSIS — Z5181 Encounter for therapeutic drug level monitoring: Secondary | ICD-10-CM | POA: Insufficient documentation

## 2016-05-01 DIAGNOSIS — R45851 Suicidal ideations: Secondary | ICD-10-CM | POA: Diagnosis present

## 2016-05-01 DIAGNOSIS — Z888 Allergy status to other drugs, medicaments and biological substances status: Secondary | ICD-10-CM

## 2016-05-01 DIAGNOSIS — F329 Major depressive disorder, single episode, unspecified: Secondary | ICD-10-CM | POA: Insufficient documentation

## 2016-05-01 DIAGNOSIS — F1420 Cocaine dependence, uncomplicated: Secondary | ICD-10-CM

## 2016-05-01 DIAGNOSIS — Z915 Personal history of self-harm: Secondary | ICD-10-CM

## 2016-05-01 DIAGNOSIS — F1120 Opioid dependence, uncomplicated: Secondary | ICD-10-CM | POA: Diagnosis present

## 2016-05-01 DIAGNOSIS — F332 Major depressive disorder, recurrent severe without psychotic features: Principal | ICD-10-CM | POA: Diagnosis present

## 2016-05-01 DIAGNOSIS — F411 Generalized anxiety disorder: Secondary | ICD-10-CM | POA: Diagnosis present

## 2016-05-01 DIAGNOSIS — Y929 Unspecified place or not applicable: Secondary | ICD-10-CM | POA: Insufficient documentation

## 2016-05-01 DIAGNOSIS — F17200 Nicotine dependence, unspecified, uncomplicated: Secondary | ICD-10-CM | POA: Diagnosis present

## 2016-05-01 DIAGNOSIS — Y999 Unspecified external cause status: Secondary | ICD-10-CM | POA: Insufficient documentation

## 2016-05-01 DIAGNOSIS — E876 Hypokalemia: Secondary | ICD-10-CM | POA: Insufficient documentation

## 2016-05-01 DIAGNOSIS — F1010 Alcohol abuse, uncomplicated: Secondary | ICD-10-CM | POA: Diagnosis present

## 2016-05-01 DIAGNOSIS — G2581 Restless legs syndrome: Secondary | ICD-10-CM | POA: Diagnosis present

## 2016-05-01 DIAGNOSIS — F32A Depression, unspecified: Secondary | ICD-10-CM

## 2016-05-01 DIAGNOSIS — F17210 Nicotine dependence, cigarettes, uncomplicated: Secondary | ICD-10-CM | POA: Diagnosis present

## 2016-05-01 DIAGNOSIS — Y939 Activity, unspecified: Secondary | ICD-10-CM | POA: Insufficient documentation

## 2016-05-01 DIAGNOSIS — X789XXA Intentional self-harm by unspecified sharp object, initial encounter: Secondary | ICD-10-CM | POA: Insufficient documentation

## 2016-05-01 DIAGNOSIS — Z23 Encounter for immunization: Secondary | ICD-10-CM | POA: Insufficient documentation

## 2016-05-01 HISTORY — DX: Major depressive disorder, single episode, unspecified: F32.9

## 2016-05-01 HISTORY — DX: Other psychoactive substance abuse, uncomplicated: F19.10

## 2016-05-01 HISTORY — DX: Depression, unspecified: F32.A

## 2016-05-01 LAB — COMPREHENSIVE METABOLIC PANEL
ALBUMIN: 4 g/dL (ref 3.5–5.0)
ALK PHOS: 74 U/L (ref 38–126)
ALT: 13 U/L — ABNORMAL LOW (ref 14–54)
ANION GAP: 9 (ref 5–15)
AST: 20 U/L (ref 15–41)
BILIRUBIN TOTAL: 0.6 mg/dL (ref 0.3–1.2)
BUN: 10 mg/dL (ref 6–20)
CALCIUM: 8.7 mg/dL — AB (ref 8.9–10.3)
CO2: 25 mmol/L (ref 22–32)
Chloride: 103 mmol/L (ref 101–111)
Creatinine, Ser: 0.72 mg/dL (ref 0.44–1.00)
GFR calc non Af Amer: >60 mL/min (ref >60)
Glucose, Bld: 164 mg/dL — ABNORMAL HIGH (ref 65–99)
POTASSIUM: 2.9 mmol/L — AB (ref 3.5–5.1)
Sodium: 137 mmol/L (ref 135–145)
TOTAL PROTEIN: 7.6 g/dL (ref 6.5–8.1)

## 2016-05-01 LAB — URINALYSIS, ROUTINE W REFLEX MICROSCOPIC
Bacteria, UA: NONE SEEN
GLUCOSE, UA: NEGATIVE mg/dL
HGB URINE DIPSTICK: NEGATIVE
KETONES UR: 5 mg/dL — AB
LEUKOCYTES UA: NEGATIVE
NITRITE: NEGATIVE
PROTEIN: 30 mg/dL — AB
RBC / HPF: NONE SEEN RBC/hpf (ref 0–5)
Specific Gravity, Urine: 1.035 — ABNORMAL HIGH (ref 1.005–1.030)
WBC UA: NONE SEEN WBC/hpf (ref 0–5)
pH: 5 (ref 5.0–8.0)

## 2016-05-01 LAB — CBC WITH DIFFERENTIAL/PLATELET
BASOS ABS: 0.1 10*3/uL (ref 0–0.1)
BASOS PCT: 1 %
EOS ABS: 0 10*3/uL (ref 0–0.7)
Eosinophils Relative: 0 %
HEMATOCRIT: 38.9 % (ref 35.0–47.0)
HEMOGLOBIN: 12.7 g/dL (ref 12.0–16.0)
Lymphocytes Relative: 12 %
Lymphs Abs: 2.4 10*3/uL (ref 1.0–3.6)
MCH: 23.6 pg — ABNORMAL LOW (ref 26.0–34.0)
MCHC: 32.6 g/dL (ref 32.0–36.0)
MCV: 72.6 fL — ABNORMAL LOW (ref 80.0–100.0)
Monocytes Absolute: 1.3 10*3/uL — ABNORMAL HIGH (ref 0.2–0.9)
Monocytes Relative: 6 %
NEUTROS ABS: 16.8 10*3/uL — AB (ref 1.4–6.5)
NEUTROS PCT: 81 %
Platelets: 332 10*3/uL (ref 150–440)
RBC: 5.37 MIL/uL — ABNORMAL HIGH (ref 3.80–5.20)
RDW: 21.2 % — ABNORMAL HIGH (ref 11.5–14.5)
WBC: 20.6 10*3/uL — AB (ref 3.6–11.0)

## 2016-05-01 LAB — ACETAMINOPHEN LEVEL: Acetaminophen (Tylenol), Serum: <10 ug/mL — ABNORMAL LOW (ref 10–30)

## 2016-05-01 LAB — ETHANOL

## 2016-05-01 LAB — URINE DRUG SCREEN, QUALITATIVE (ARMC ONLY)
AMPHETAMINES, UR SCREEN: NOT DETECTED
Barbiturates, Ur Screen: NOT DETECTED
Benzodiazepine, Ur Scrn: NOT DETECTED
COCAINE METABOLITE, UR ~~LOC~~: POSITIVE — AB
Cannabinoid 50 Ng, Ur ~~LOC~~: NOT DETECTED
MDMA (ECSTASY) UR SCREEN: NOT DETECTED
METHADONE SCREEN, URINE: NOT DETECTED
OPIATE, UR SCREEN: POSITIVE — AB
Phencyclidine (PCP) Ur S: NOT DETECTED
Tricyclic, Ur Screen: NOT DETECTED

## 2016-05-01 LAB — BASIC METABOLIC PANEL
Anion gap: 6 (ref 5–15)
BUN: 6 mg/dL (ref 6–20)
CALCIUM: 8.5 mg/dL — AB (ref 8.9–10.3)
CO2: 25 mmol/L (ref 22–32)
Chloride: 106 mmol/L (ref 101–111)
Creatinine, Ser: 0.48 mg/dL (ref 0.44–1.00)
GFR calc Af Amer: >60 mL/min (ref >60)
GLUCOSE: 119 mg/dL — AB (ref 65–99)
Potassium: 4 mmol/L (ref 3.5–5.1)
Sodium: 137 mmol/L (ref 135–145)

## 2016-05-01 LAB — SALICYLATE LEVEL

## 2016-05-01 LAB — PREGNANCY, URINE: Preg Test, Ur: NEGATIVE

## 2016-05-01 LAB — LIPASE, BLOOD: Lipase: <10 U/L — ABNORMAL LOW (ref 11–51)

## 2016-05-01 IMAGING — CT CT HEAD W/O CM
3 series · 16 of 44 positions shown, 19 images · non-contrast
Comparison: [DATE]

CLINICAL DATA: Persistent headache, anxiety.

EXAM:
CT HEAD WITHOUT CONTRAST
TECHNIQUE: Contiguous axial images were obtained from the base of the skull
through the vertex without intravenous contrast.

[Series 3: head wo · axial · 0.40mm/px · z∈[-104,+6]mm · 10 of 27 slices shown, 13 images]
[im 3/27  brain]
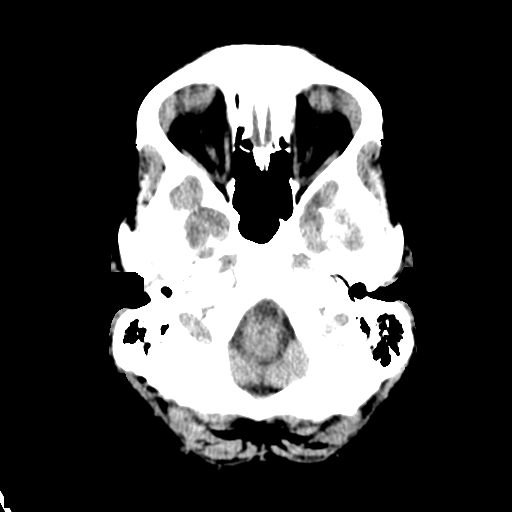
[im 3/27  bone]
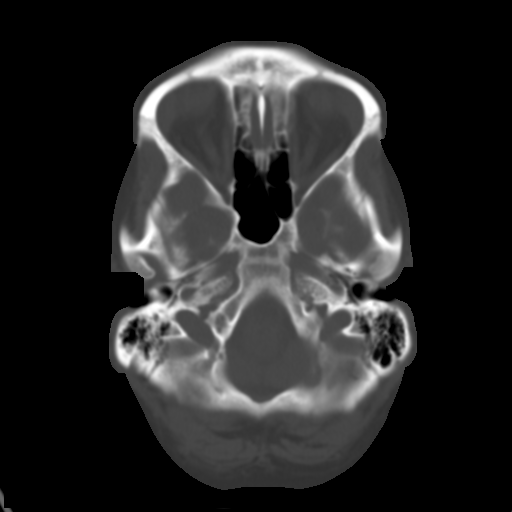
[im 5/27  brain]
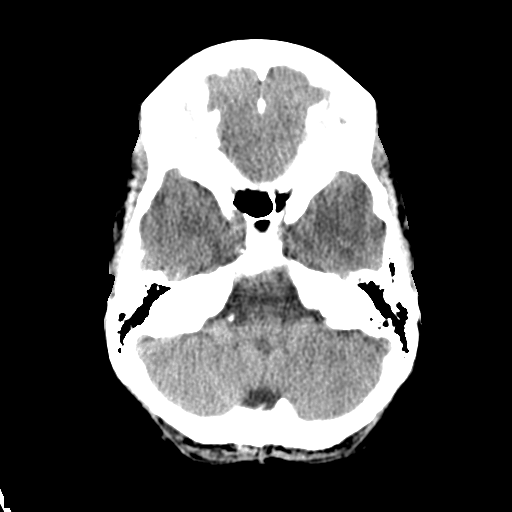
[im 8/27  brain]
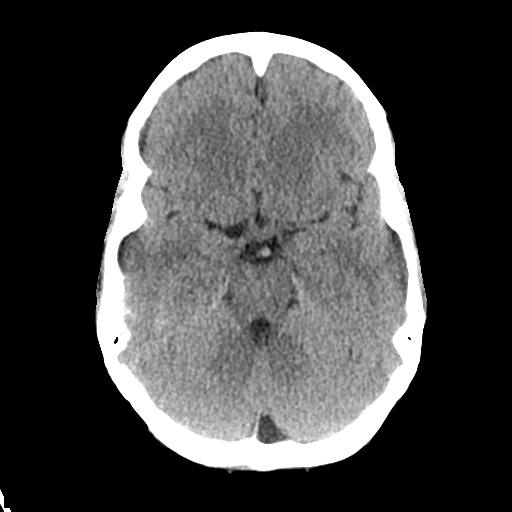
[im 10/27  brain]
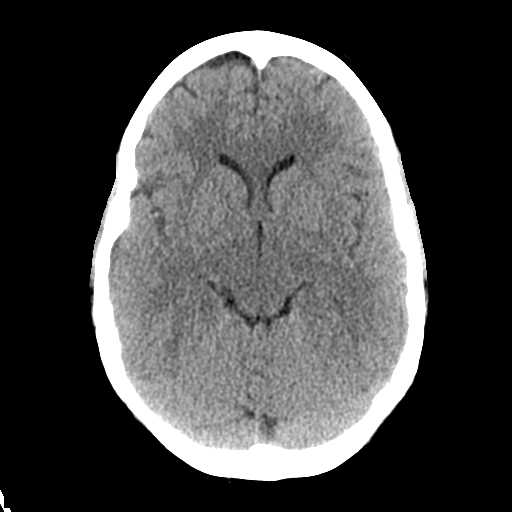
[im 13/27  brain]
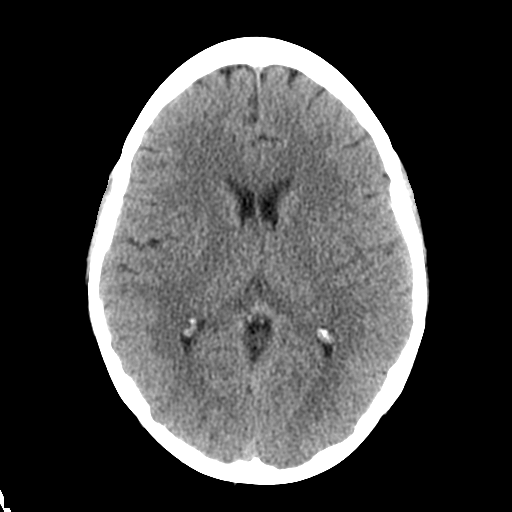
[im 13/27  bone]
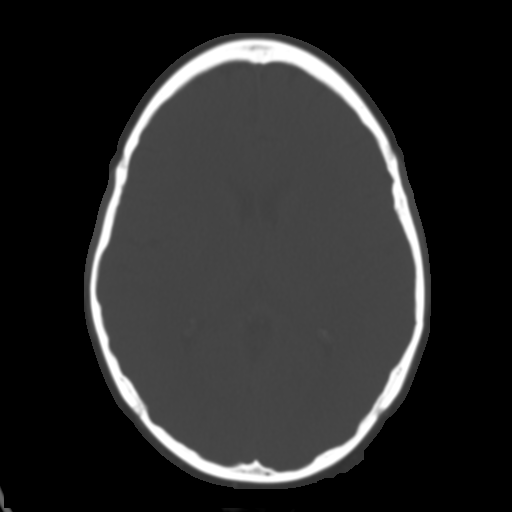
[im 15/27  brain]
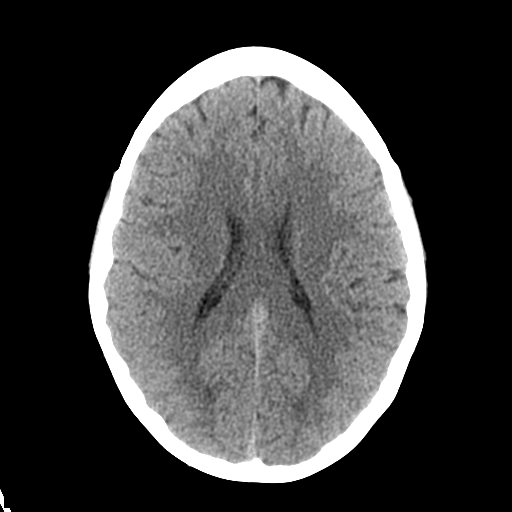
[im 18/27  brain]
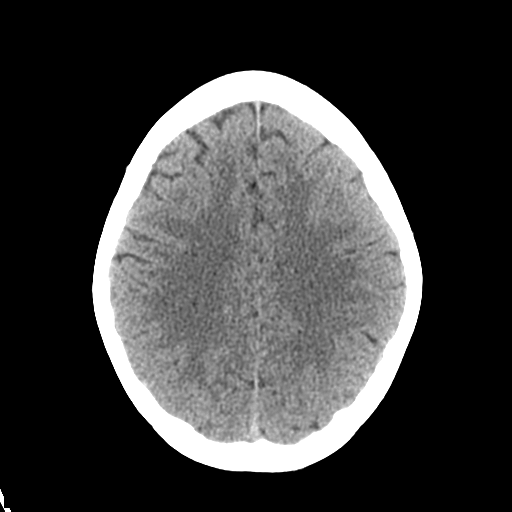
[im 20/27  brain]
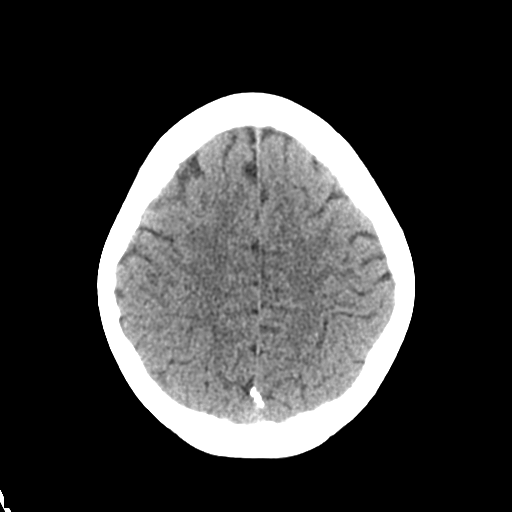
[im 23/27  brain]
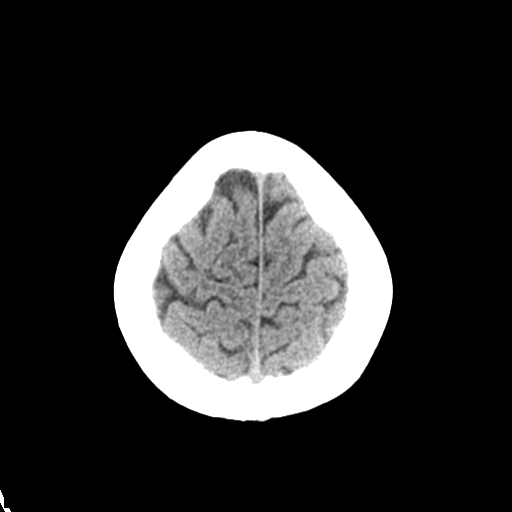
[im 23/27  bone]
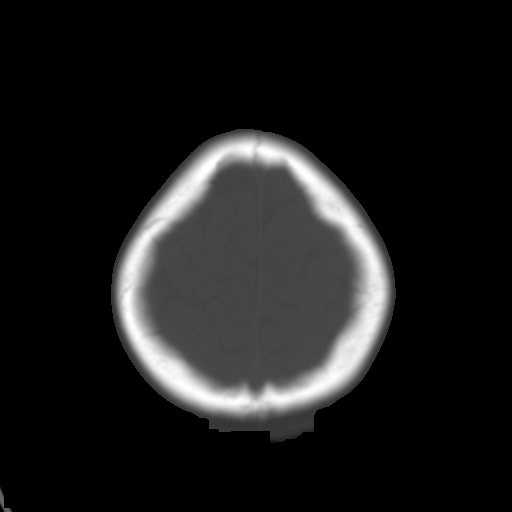
[im 25/27  brain]
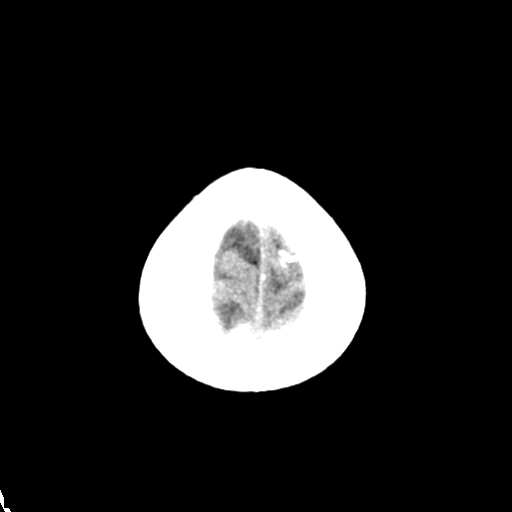

[Series 4: coronal soft tissue · coronal · 0.29mm/px · 3 of 61 slices shown]
[im 21/61  brain]
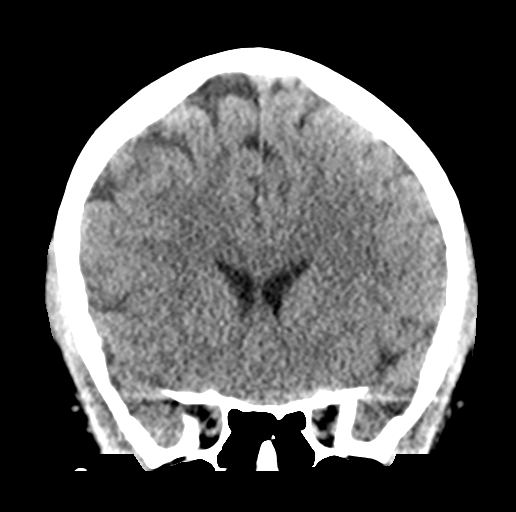
[im 27/61  brain]
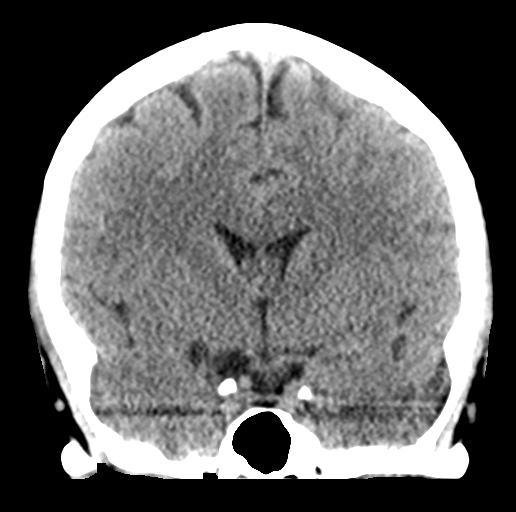
[im 34/61  brain]
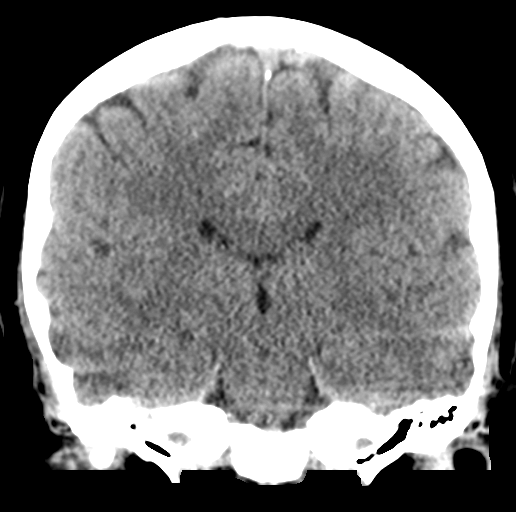

[Series 5: sagittal soft tissue · sagittal · 0.28mm/px · 3 of 50 slices shown]
[im 17/50  brain]
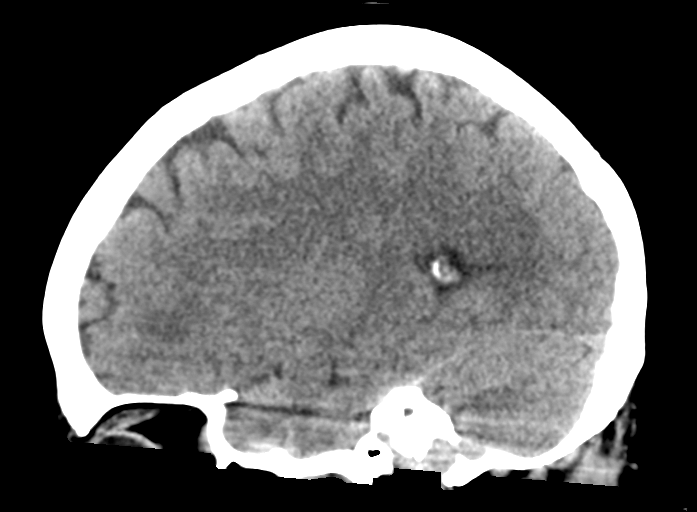
[im 25/50  brain]
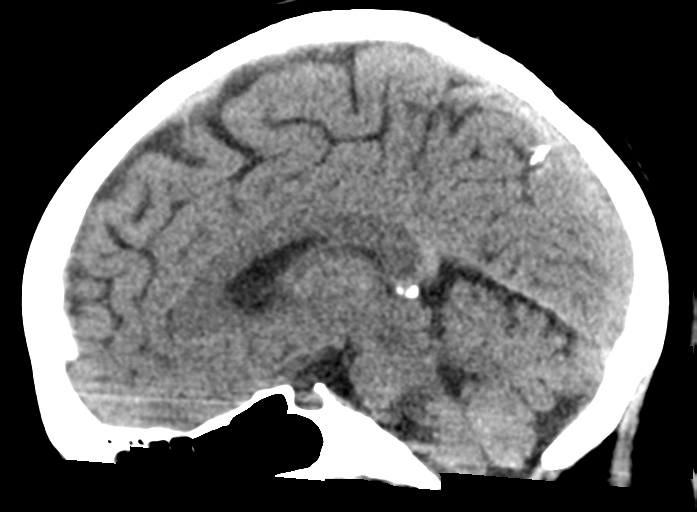
[im 33/50  brain]
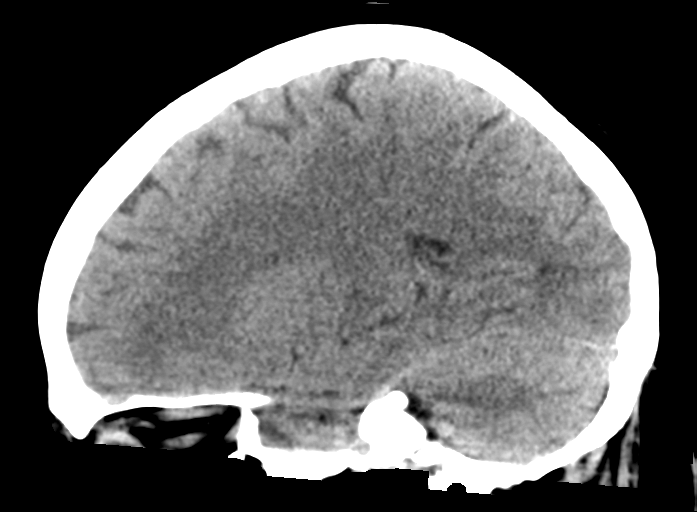

[16 of 44 positions shown; findings below may reference images not displayed]

FINDINGS: Brain: There is no intracranial hemorrhage, mass or evidence of
acute infarction. There is no extra-axial fluid collection. Gray
matter and white matter appear normal. Cerebral volume is normal for
age. Brainstem and posterior fossa are unremarkable. The CSF spaces
appear normal.

Vascular: No hyperdense vessel or unexpected calcification.

Skull: Normal. Negative for fracture or focal lesion.

Sinuses/Orbits: No acute finding.

Other: None.
IMPRESSION: Normal brain

## 2016-05-01 IMAGING — CR DG CHEST 2V
2 series · 2 of 2 positions shown · non-contrast
Comparison: None.

CLINICAL DATA: Leukocytosis.  Cough tonight.

EXAM:
CHEST  2 VIEW

[chest pa]
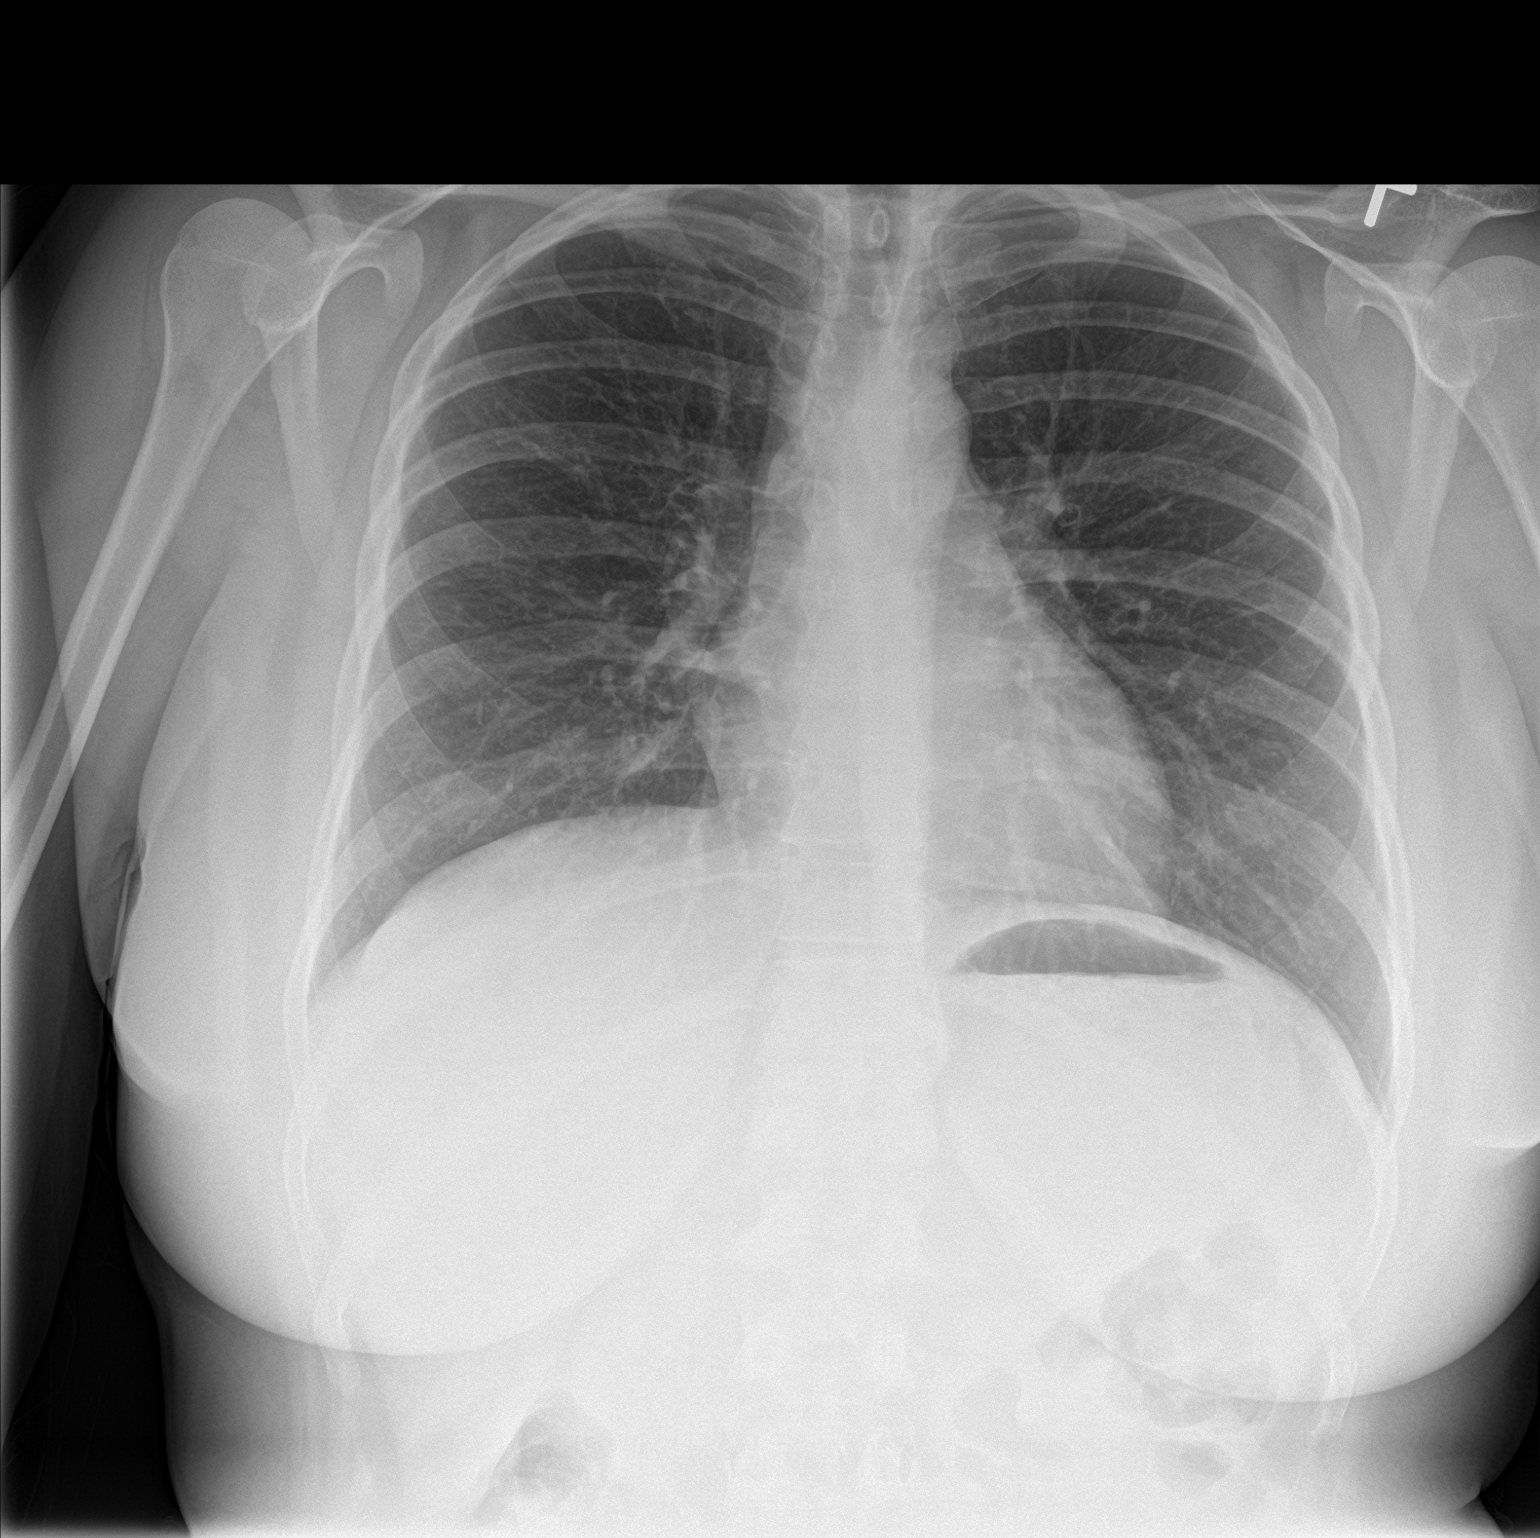

[chest lat]
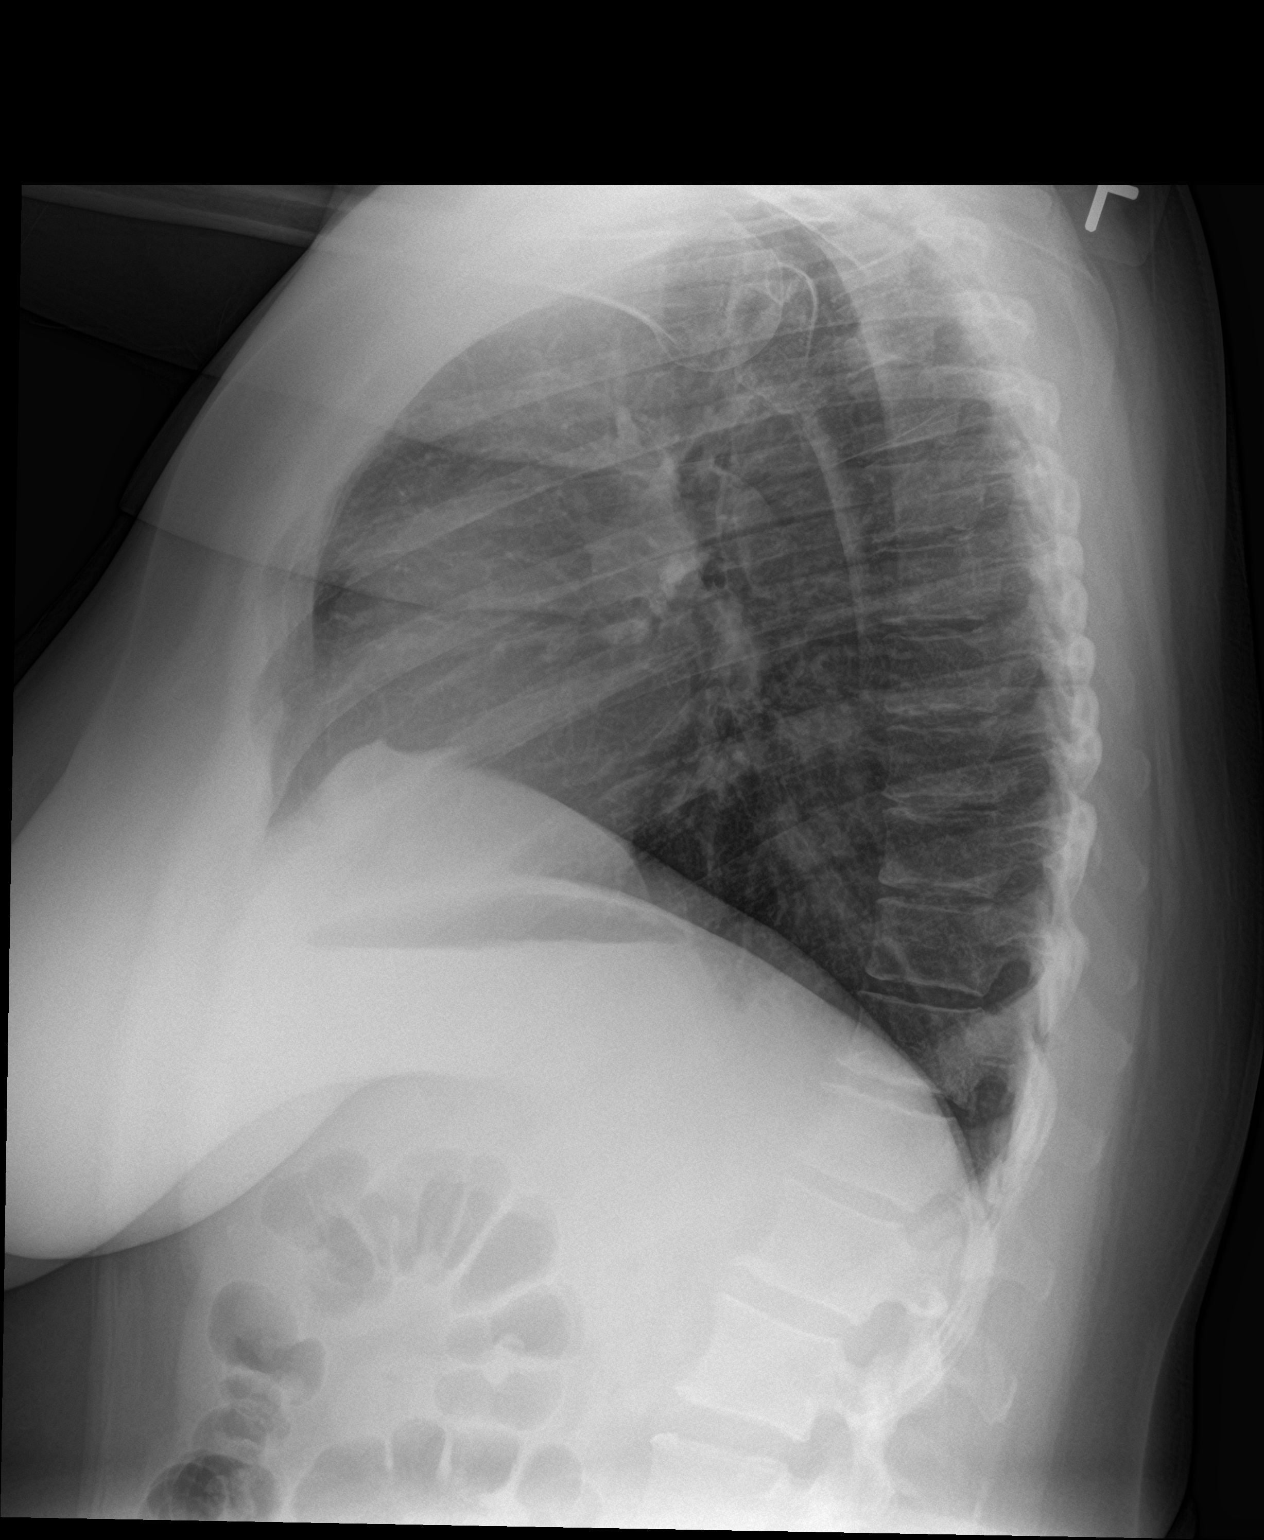

[2 of 2 positions shown; findings below may reference images not displayed]

FINDINGS: The lungs are clear. The pulmonary vasculature is normal. Heart size
is normal. Hilar and mediastinal contours are unremarkable. There is
no pleural effusion.
IMPRESSION: No active cardiopulmonary disease.

## 2016-05-01 MED ORDER — NICOTINE 21 MG/24HR TD PT24
21.0000 mg | MEDICATED_PATCH | Freq: Every day | TRANSDERMAL | Status: DC
Start: 2016-05-02 — End: 2016-05-04
  Administered 2016-05-02 – 2016-05-04 (×3): 21 mg via TRANSDERMAL
  Filled 2016-05-01 (×3): qty 1

## 2016-05-01 MED ORDER — ACETAMINOPHEN 500 MG PO TABS
1000.0000 mg | ORAL_TABLET | Freq: Once | ORAL | Status: AC
  Administered 2016-05-01: 1000 mg via ORAL

## 2016-05-01 MED ORDER — PANTOPRAZOLE SODIUM 40 MG PO TBEC
40.0000 mg | DELAYED_RELEASE_TABLET | Freq: Every day | ORAL | Status: DC
  Administered 2016-05-02 – 2016-05-04 (×3): 40 mg via ORAL
  Filled 2016-05-01 (×3): qty 1

## 2016-05-01 MED ORDER — ACETAMINOPHEN 500 MG PO TABS
ORAL_TABLET | ORAL | Status: AC
  Administered 2016-05-01: 1000 mg via ORAL
  Filled 2016-05-01: qty 2

## 2016-05-01 MED ORDER — ALUM & MAG HYDROXIDE-SIMETH 200-200-20 MG/5ML PO SUSP: 30.0000 mL | ORAL | Status: DC | PRN

## 2016-05-01 MED ORDER — TETANUS-DIPHTH-ACELL PERTUSSIS 5-2.5-18.5 LF-MCG/0.5 IM SUSP
0.5000 mL | Freq: Once | INTRAMUSCULAR | Status: AC
  Administered 2016-05-01: 0.5 mL via INTRAMUSCULAR
  Filled 2016-05-01: qty 0.5

## 2016-05-01 MED ORDER — ACETAMINOPHEN 325 MG PO TABS
650.0000 mg | ORAL_TABLET | Freq: Four times a day (QID) | ORAL | Status: DC | PRN
  Administered 2016-05-01 – 2016-05-04 (×4): 650 mg via ORAL
  Filled 2016-05-01 (×4): qty 2

## 2016-05-01 MED ORDER — POTASSIUM CHLORIDE CRYS ER 20 MEQ PO TBCR
40.0000 meq | EXTENDED_RELEASE_TABLET | Freq: Every day | ORAL | Status: AC
  Administered 2016-05-02: 40 meq via ORAL
  Filled 2016-05-01: qty 2

## 2016-05-01 MED ORDER — MAGNESIUM HYDROXIDE 400 MG/5ML PO SUSP: 30.0000 mL | Freq: Every day | ORAL | Status: DC | PRN

## 2016-05-01 MED ORDER — TRAZODONE HCL 100 MG PO TABS
100.0000 mg | ORAL_TABLET | Freq: Every evening | ORAL | Status: DC | PRN
  Administered 2016-05-01: 100 mg via ORAL
  Filled 2016-05-01: qty 1

## 2016-05-01 MED ORDER — HYDROXYZINE HCL 50 MG PO TABS
50.0000 mg | ORAL_TABLET | Freq: Three times a day (TID) | ORAL | Status: DC | PRN
  Administered 2016-05-01 – 2016-05-02 (×3): 50 mg via ORAL
  Filled 2016-05-01 (×3): qty 1

## 2016-05-01 MED ORDER — POTASSIUM CHLORIDE CRYS ER 20 MEQ PO TBCR
40.0000 meq | EXTENDED_RELEASE_TABLET | Freq: Every day | ORAL | Status: DC
  Administered 2016-05-01 (×2): 40 meq via ORAL
  Filled 2016-05-01 (×2): qty 2

## 2016-05-01 NOTE — Tx Team (Signed)
Initial Treatment Plan 05/01/2016 11:41 PM Brenda [name]:[id number]    PATIENT STRESSORS: Financial difficulties Loss of home / boyfriend 2016 Medication change or noncompliance Substance abuse   PATIENT STRENGTHS: Ability for insight General fund of knowledge Motivation for treatment/growth Supportive family/friends   PATIENT IDENTIFIED PROBLEMS: Risk for suicide  anxiety  depression  Homelessness (stays in [place])  "help with coping skills"  SA (cocaine / heroin)           DISCHARGE CRITERIA:  Ability to meet basic life and health needs Adequate post-discharge living arrangements Improved stabilization in mood, thinking, and/or behavior Verbal commitment to aftercare and medication compliance  PRELIMINARY DISCHARGE PLAN: Attend aftercare/continuing care group Outpatient therapy  PATIENT/FAMILY INVOLVEMENT: This treatment plan has been presented to and reviewed with the patient, Brenda [name].  The patient and family have been given the opportunity to ask questions and make suggestions.  Delos Haring, RN 05/01/2016, 11:41 PM

## 2016-05-01 NOTE — BH Assessment (Signed)
Assessment Note  Brenda [name] is an 46 y.o. female. Pt presents to ED with feelings of anxiety and hopelessness. Pt states she needs help with her depression and reports having thoughts of wanting to hurt herself. Pt attempted to overdose on "a bottle of nitro" approx 3 weeks ago and states.  Pt with flat affect and unable to participate in evaluation without fluctuations of consciousness.  Superficial cuts noted to left forearm. Brenda [name] questioned about suicidally pt states " I have disappointed everyone I think it would just be easier." Pt reports a history of drug use. She states that she had been sober for 10 weeks although she relapsed on last night. She reports using cocaine, heroin, and ETOH tonight. She reports staying in a hotel with a female friend who has also been abusing drugs. No current outpatient treatment reproted. A thorough psychiatric evaluation has been completed including the evaluation of the patient, reviewing available medical/clinic records, evaluating the pts unique risks and protective factors, and discussing treatment recommendations. Writer is concerned for Pts safety and the safety of others. Pt  cannot contract for safety outside of the hospital. Pt requires inpatient psychiatric hospitalization for safety common stabilization, and medication management.   Diagnosis: Major Depressive Disorder   Past Medical History:  Past Medical History:  Diagnosis Date  . Depression   . Polysubstance abuse     Past Surgical History:  Procedure Laterality Date  . TUBAL LIGATION      Family History: History reviewed. No pertinent family history.  Social History:  reports that she has been smoking Cigarettes.  She has been smoking about 1.00 pack per day. She has never used smokeless tobacco. She reports that she uses drugs, including Cocaine, about 7 times per week. She reports that she does not drink alcohol.  Additional Social History:  Alcohol / Drug Use Pain Medications:  SEE MAR  Prescriptions: SEE MAR  Over the Counter: SEE MAR  History of alcohol / drug use?: Yes Substance #1 Name of Substance 1: Cocaine 1 - Age of First Use: 20's 1 - Amount (size/oz): unknown  1 - Frequency: Relapsed on 04/30/16, was sober for 10 weeks   1 - Duration:  was sober for 10 weeks   1 - Last Use / Amount: 04/30/16, amount unknown  Substance #2 Name of Substance 2: Heroin  2 - Age of First Use: 42 2 - Amount (size/oz): n/a 2 - Frequency: Relapsed on 04/30/16, was sober for 10 weeks   2 - Duration:  was sober for 10 weeks   2 - Last Use / Amount: 04/30/16, "2 bags" Substance #3 Name of Substance 3: Alcohol  3 - Age of First Use: 46 y.o 3 - Amount (size/oz): n/a 3 - Frequency: Relapsed on 04/30/16, was sober for 10 weeks   3 - Duration: was sober for 10 week 3 - Last Use / Amount: 04/30/16, 2 beers  CIWA: CIWA-Ar BP: 131/85 Pulse Rate: (!) 112 COWS:    Allergies:  Allergies  Allergen Reactions  . Flexeril [Cyclobenzaprine] Other (See Comments)    Increased muscle spasms    Home Medications:  (Not in a hospital admission)  OB/GYN Status:  No LMP recorded. Patient is not currently having periods (Reason: Perimenopausal).  General Assessment Data Location of Assessment: [facility] ED TTS Assessment: In system Is this a Tele or Face-to-Face Assessment?: Face-to-Face Is this an Initial Assessment or a Re-assessment for this encounter?: Initial Assessment Is patient pregnant?: No Pregnancy Status: No Admission Status:  Involuntary Is patient capable of signing voluntary admission?: No Referral Source: Self/Family/Friend Insurance type: none   Medical Screening Exam [facility] Walk-in ONLY) Medical Exam completed: Yes  Crisis Care Plan Legal Guardian: Other: (none) Name of Psychiatrist: none  Name of Therapist: none  Education Status Is patient currently in school?: No Current Grade: n/a Highest grade of school patient has completed: [employer] Name of school:  n/a Contact person: n/a  Risk to self with the past 6 months Suicidal Ideation: Yes-Currently Present Has patient been a risk to self within the past 6 months prior to admission? : Yes Suicidal Intent: No-Not Currently/Within Last 6 Months Has patient had any suicidal intent within the past 6 months prior to admission? : Yes Is patient at risk for suicide?: Yes Suicidal Plan?: No-Not Currently/Within Last 6 Months Has patient had any suicidal plan within the past 6 months prior to admission? : Yes Access to Means: Yes Specify Access to Suicidal Means: Drank Nitro What has been your use of drugs/alcohol within the last 12 months?: recent relapse  Previous Attempts/Gestures: Yes How many times?: 3 Other Self Harm Risks: cutting  Triggers for Past Attempts: Unknown Intentional Self Injurious Behavior: Cutting Comment - Self Injurious Behavior: recent cutting bx Family Suicide History: No Recent stressful life event(s): Financial Problems Persecutory voices/beliefs?: No Depression: Yes Depression Symptoms: Insomnia, Despondent, Feeling worthless/self pity Substance abuse history and/or treatment for substance abuse?: Yes Suicide prevention information given to non-admitted patients: Yes  Risk to Others within the past 6 months Homicidal Ideation: No Does patient have any lifetime risk of violence toward others beyond the six months prior to admission? : No Thoughts of Harm to Others: No Current Homicidal Intent: No Current Homicidal Plan: No Access to Homicidal Means: No Identified Victim: n/a History of harm to others?: No Assessment of Violence: None Noted Violent Behavior Description: n/a Does patient have access to weapons?: No Criminal Charges Pending?: No Does patient have a court date: No Is patient on probation?: No  Psychosis Hallucinations: None noted Delusions: None noted  Mental Status Report Appearance/Hygiene: In scrubs Eye Contact: Poor Motor Activity:  Freedom of movement Speech: Slow, Slurred Level of Consciousness: Drowsy Mood: Depressed Affect: Depressed Anxiety Level: None Thought Processes: Coherent Judgement: Impaired Orientation: Person, Place, Time, Situation Obsessive Compulsive Thoughts/Behaviors: None  Cognitive Functioning Concentration: Poor Memory: Remote Intact, Recent Intact IQ: Average Insight: Poor Impulse Control: Poor Appetite: Good Weight Loss: 0 Weight Gain: 0 Sleep: Decreased Total Hours of Sleep:  (varies) Vegetative Symptoms: None  ADLScreening [facility]) Patient's cognitive ability adequate to safely complete daily activities?: Yes Patient able to express need for assistance with ADLs?: Yes Independently performs ADLs?: Yes (appropriate for developmental age)  Prior Inpatient Therapy Prior Inpatient Therapy: Yes Prior Therapy Dates: 2016, Prior Therapy Facilty/Provider(s): [facility], [facility] Reason for Treatment: MDD  Prior Outpatient Therapy Prior Outpatient Therapy: No Prior Therapy Dates: n/a Prior Therapy Facilty/Provider(s): n/a Reason for Treatment: n/a Does patient have an ACCT team?: No Does patient have Intensive In-House Services?  : No Does patient have [facility] services? : No Does patient have P4CC services?: No  ADL Screening (condition at time of admission) Patient's cognitive ability adequate to safely complete daily activities?: Yes Patient able to express need for assistance with ADLs?: Yes Independently performs ADLs?: Yes (appropriate for developmental age)             Advance Directives (For Healthcare) Does Patient Have a Medical Advance Directive?: No    Additional Information 1:1  In Past 12 Months?: No CIRT Risk: No Elopement Risk: No Does patient have medical clearance?: Yes     Disposition:  Disposition Initial Assessment Completed for this Encounter: Yes Disposition of Patient: Other dispositions Other disposition(s): Other (Comment)  (Consult with Psych MD)  On Site Evaluation by:   Reviewed with Physician:    Asa Saunas 05/01/2016 5:47 PM

## 2016-05-01 NOTE — ED Notes (Signed)
Pt tearful after making phone call. Pt returned to room and was compliant with medications. Pt is calm. Maintained on 15 minute checks and observation by security camera for safety.

## 2016-05-01 NOTE — ED Notes (Signed)
Report called to [employer]. Patient in agreement with transfer to BMU.

## 2016-05-01 NOTE — ED Triage Notes (Addendum)
Pt presents to ED with feelings of anxiety and hopelessness. Pt states she needs help with her depression and reports having thoughts of wanting to hurt herself. Attempted to overdose on "a bottle of nitro" approx 3 weeks ago and states "but it didn't work". Reports using cocaine, heroin, and ETOH tonight. Pt with flat affect. Superficial cuts noted to left forearm. Pt states "she didn't cut deep enough".

## 2016-05-01 NOTE — ED Notes (Signed)
Pt. Alert and oriented, warm and dry, in no distress. Pt. Denies SI, HI, and AVH. Pt. Encouraged to let nursing staff know of any concerns or needs. 

## 2016-05-01 NOTE — Progress Notes (Signed)
Admission Note:  5yr femal53yrwho presents IVC in no acute distress for the treatment of SI and Depression. Pt appears anxious and depressed. Pt was cooperative with admission process. Pt presents with passive SI and contracts for safety upon admission. Pt denies AVH . Pt stated she was having increase depression and anxiety for the past few months and began cutting , but wanted to come to the hospital before she started cutting deeper . Pt relapsed on Heroin after being clean for a few months, pt does cocaine off and on. Pt said her boyfriend Arlys John) died 01/06/2017and she continued to live with the dad, but started to date her current boyfriend Arlys John) whom sh knew in her past . The old BF father did not like this , so she was not allowed to stay , so pt and her current Bf have been homeless, staying at hotels from time to time with current boyfriend's grandmother's help grandmother's help.   A:Skin was assessed by female nurse found to have superficial cuts L-forearm.PT searched and no contraband found, POC and unit policies explained and understanding verbalized. Consents obtained. Food and fluids offered, and fluids accepted.  R: Pt had no additional questions or concerns.

## 2016-05-01 NOTE — ED Provider Notes (Signed)
[facility] Emergency Department Provider Note  ____________________________________________   First MD Initiated Contact with Patient 05/01/16 [phone]     (approximate)  I have reviewed the triage vital signs and the nursing notes.   HISTORY  Chief Complaint Psychiatric Evaluation    HPI Brenda [name] is a 46 y.o. female with a history of depression (untreated), cocaine abuse, and heroin abuse who presents for evaluation of suicidal ideation.  She states that she has had worsening depression for months and has not sought treatment.  She is using cocaine and heroin and also had alcohol last night which is unusual for her.  She has not sought help at [facility].  She states that she has recently tried to overdose" on a bottle of nitroglycerin" but that was 3 weeks ago.She has multiple superficial lacerations on her left forearm and wrist where she states she cut herself within the last 24 hours or so but "I did not cut deep enough".  She endorses persistent suicidal ideation and states that she wants to die.  She has had some recent vomiting episodes but says that is usual for her when she is using drugs.  She had a fall 6 days ago where she struck her head and states that she has a persistent headache that nothing makes it better or worse.  She has no focal numbness or weakness in any of her extremities.  She denies chest pain, shortness of breath, and fever/chills.   Past Medical History:  Diagnosis Date  . Depression   . Polysubstance abuse     There are no active problems to display for this patient.   Past Surgical History:  Procedure Laterality Date  . TUBAL LIGATION      Prior to Admission medications   Not on File    Allergies Flexeril [cyclobenzaprine]  History reviewed. No pertinent family history.  Social History Social History  Substance Use Topics  . Smoking status: Current Every Day Smoker    Packs/day: 1.00    Types: Cigarettes    . Smokeless tobacco: Never Used  . Alcohol use No    Review of Systems Constitutional: No fever/chills Eyes: No visual changes. ENT: No sore throat. Cardiovascular: Denies chest pain. Respiratory: Denies shortness of breath. Gastrointestinal: No abdominal pain.  Recent nausea and vomiting.  No diarrhea.  No constipation. Genitourinary: Negative for dysuria. Musculoskeletal: Negative for back pain. Neurological: Generalized headache, no focal weakness or numbness. Psych:  Depression gradually worsening for months, suicidal ideation and suicide attempt by overdose 3 weeks ago.  Self-harm with superficial lacerations to left forearm and wrist  10-point ROS otherwise negative.  ____________________________________________   PHYSICAL EXAM:  VITAL SIGNS: ED Triage Vitals [05/01/16 0440]  Enc Vitals Group     BP 131/85     Pulse Rate (!) 112     Resp 20     Temp 98.1 F (36.7 C)     Temp Source Oral     SpO2 97 %     Weight 175 lb (79.4 kg)     Height  (1.575 m)     Head Circumference      Peak Flow      Pain Score 0     Pain Loc      Pain Edu?      Excl. in GC?     Constitutional: Alert and oriented. Well appearing and in no acute distress. Eyes: Conjunctivae are normal. PERRL. EOMI. Head: Atraumatic. Nose: No congestion/rhinnorhea.  Mouth/Throat: Mucous membranes are moist. Neck: No stridor.  No meningeal signs.   Cardiovascular: Normal rate, regular rhythm. Good peripheral circulation. Grossly normal heart sounds. Respiratory: Normal respiratory effort.  No retractions. Lungs CTAB. Gastrointestinal: Soft and nontender. No distention.  Musculoskeletal: No lower extremity tenderness nor edema. No gross deformities of extremities. Neurologic:  Normal speech and language. No gross focal neurologic deficits are appreciated.  Skin:  Skin is warm, dry. Multiple superficial lacerations to the left arm with no evidence of cellulitis and no indication for  repair. Psychiatric: Mood and affect are flat and calm.  She endorses depression and suicidal ideation. ____________________________________________   LABS (all labs ordered are listed, but only abnormal results are displayed)  Labs Reviewed  COMPREHENSIVE METABOLIC PANEL - Abnormal; Notable for the following:       Result Value   Potassium 2.9 (*)    Glucose, Bld 164 (*)    Calcium 8.7 (*)    ALT 13 (*)    All other components within normal limits  CBC WITH DIFFERENTIAL/PLATELET - Abnormal; Notable for the following:    WBC 20.6 (*)    RBC 5.37 (*)    MCV 72.6 (*)    MCH 23.6 (*)    RDW 21.2 (*)    Neutro Abs 16.8 (*)    Monocytes Absolute 1.3 (*)    All other components within normal limits  URINE DRUG SCREEN, QUALITATIVE ([facility] ONLY) - Abnormal; Notable for the following:    Cocaine Metabolite,Ur [place] POSITIVE (*)    Opiate, Ur Screen POSITIVE (*)    All other components within normal limits  ACETAMINOPHEN LEVEL - Abnormal; Notable for the following:    Acetaminophen (Tylenol), Serum <10 (*)    All other components within normal limits  LIPASE, BLOOD - Abnormal; Notable for the following:    Lipase <10 (*)    All other components within normal limits  URINALYSIS, ROUTINE W REFLEX MICROSCOPIC - Abnormal; Notable for the following:    Color, Urine YELLOW (*)    APPearance TURBID (*)    Specific Gravity, Urine 1.035 (*)    Bilirubin Urine SMALL (*)    Ketones, ur 5 (*)    Protein, ur 30 (*)    Squamous Epithelial / LPF 0-5 (*)    All other components within normal limits  ETHANOL  SALICYLATE LEVEL  PREGNANCY, URINE   ____________________________________________  EKG  None - EKG not ordered by ED physician ____________________________________________  RADIOLOGY   Dg Chest 2 View  Result Date: 05/01/2016 CLINICAL DATA:  Leukocytosis.  Cough tonight. EXAM: CHEST  2 VIEW COMPARISON:  None. FINDINGS: The lungs are clear. The pulmonary vasculature is normal. Heart  size is normal. Hilar and mediastinal contours are unremarkable. There is no pleural effusion. IMPRESSION: No active cardiopulmonary disease. Electronically Signed   By: Ellery Plunk M.D.   On: 05/01/2016 05:58   Ct Head Wo Contrast  Result Date: 05/01/2016 CLINICAL DATA:  Persistent headache, anxiety. EXAM: CT HEAD WITHOUT CONTRAST TECHNIQUE: Contiguous axial images were obtained from the base of the skull through the vertex without intravenous contrast. COMPARISON:  07/19/2013 FINDINGS: Brain: There is no intracranial hemorrhage, mass or evidence of acute infarction. There is no extra-axial fluid collection. Gray matter and white matter appear normal. Cerebral volume is normal for age. Brainstem and posterior fossa are unremarkable. The CSF spaces appear normal. Vascular: No hyperdense vessel or unexpected calcification. Skull: Normal. Negative for fracture or focal lesion. Sinuses/Orbits: No acute finding. Other: None.  IMPRESSION: Normal brain Electronically Signed   By: Ellery Plunk M.D.   On: 05/01/2016 06:01    ____________________________________________   PROCEDURES  Critical Care performed: No   Procedure(s) performed:   Procedures   ____________________________________________   INITIAL IMPRESSION / ASSESSMENT AND PLAN / ED COURSE  Pertinent labs & imaging results that were available during my care of the patient were reviewed by me and considered in my medical decision making (see chart for details).     Clinical Course as of May 02 730  Thu May 01, 2016  0533 Clear suicidality and reportedly with recent attempts at overdose.  Self-harm (superficial abrasions to left arm).  Will update tetanus vaccination.  Leukocytosis, possibly due to recent vomiting, but will evaluate with UA and CXR (no current medical symptoms).  Persistent headache 6 days after a fall, will evaluate with head CT.  IVC, psych evaluation.  No evidence of acute medical issue at this time but  will investigate further.  [CF]  0621 Normal CXR and head CT.  Still NAD.  Leukocytosis without evidence of acute infection.  Stable for transfer to [facility].  [CF]  [phone] Mildly hypokalemic, likely due to recent vomiting.  Providing oral supplement for three days.  [CF]    Clinical Course User Index [CF] Loleta Rose, MD    ____________________________________________  FINAL CLINICAL IMPRESSION(S) / ED DIAGNOSES  Final diagnoses:  Suicidal ideation  Self-inflicted laceration of wrist, left, initial encounter  Depression, unspecified depression type  Hypokalemia     MEDICATIONS GIVEN DURING THIS VISIT:  Medications  potassium chloride SA (K-DUR,KLOR-CON) CR tablet 40 mEq (40 mEq Oral Given 05/01/16 0646)  acetaminophen (TYLENOL) tablet 1,000 mg (1,000 mg Oral Given 05/01/16 0642)  Tdap (BOOSTRIX) injection 0.5 mL (0.5 mLs Intramuscular Given 05/01/16 0647)     NEW OUTPATIENT MEDICATIONS STARTED DURING THIS VISIT:  New Prescriptions   No medications on file    Modified Medications   No medications on file    Discontinued Medications   No medications on file     Note:  This document was prepared using Dragon voice recognition software and may include unintentional dictation errors.    Loleta Rose, MD 05/01/16 [phone]

## 2016-05-01 NOTE — ED Notes (Signed)
Pt calm and cooperative. Pt endorses SI, contracts for safety on the unit. Denies HI / AVH.  Pt stated she has had a headache for days and was given tylenol prior to coming to [facility]. No further needs or concerns at this time. Maintained on 15 minute checks and observation by security camera for safety.

## 2016-05-01 NOTE — ED Notes (Signed)

## 2016-05-01 NOTE — ED Notes (Signed)
Patient is to be admitted to [facility] by Dr. Toni Amend.  Attending Physician will be Dr. Jennet Maduro.   Patient has been assigned to room 319B, by [facility] Charge Nurse Monson.   Intake Paper Work has been signed and placed on patient chart.  ER staff is aware of the admission ( Dr.Glenda ER Sect.;  ER MD; Clydie Braun Patient's Nurse & Lauren Patient Access).

## 2016-05-01 NOTE — ED Notes (Signed)
Dinner tray given w/[employer].

## 2016-05-01 NOTE — ED Notes (Signed)
IVC/Consult completed/Pending Admission to [facility] BMU 

## 2016-05-01 NOTE — ED Notes (Signed)
Lunch tray given. Pt appears flat, denies current SI/AVH and contracts for safety. Will continue to monitor for needs/safety.

## 2016-05-01 NOTE — Consult Note (Signed)
[facility] Consult   Reason for Consult:  Consult for 46 year old woman with a history of depression and substance abuse presented to the emergency room with suicidal ideation Referring Physician:  Rifenbark Patient Identification: Brenda [name] MRN:  [id number] Principal Diagnosis: Severe recurrent major depression without psychotic features [facility]) Diagnosis:   Patient Active Problem List   Diagnosis Date Noted  . Severe recurrent major depression without psychotic features ([place]) [F33.2] 05/01/2016  . Cocaine abuse [F14.10] 05/01/2016  . Alcohol abuse [F10.10] 05/01/2016  . GERD (gastroesophageal reflux disease) [K21.9] 05/01/2016    Total Time spent with patient: 1 hour  Subjective:   Brenda [name] is a 46 y.o. female patient admitted with "depression and anxiety. It's too much".  HPI:  Patient interviewed. Chart reviewed. This a 46 year old woman with a history of depression and PTSD and anxiety and substance abuse. Came into the hospital with suicidal ideation. She cut her left forearm up several times over the last couple days. Feeling hopeless and overwhelmed. Sleep is poor. By mouth intake is poor. Patient has been living in a motel with a "boyfriend" and the 2 of them of been using drugs and alcohol. Last night she used heroin for the first time in a couple months. Has been using cocaine a couple of times a week and has been drinking as well. Patient is having active thoughts of wishing that she were dead. No active psychotic symptoms.  Medical history: Patient has a history of gastroesophageal reflux. She tells me there was a time in the past when she was evaluated for possibly having a heart attack but she doesn't know of any outcome to that except that she was supposed to be on "2 heart medicines"  Substance abuse history: Long-standing problem with drug abuse including opiate dependence cocaine and alcohol. Not currently involved in any active substance  abuse treatment although she has been in the past.  Social history: Patient is living with a boyfriend. Not working. She just got out of prison in October soon afterwards her father died and she has been sort of spiraling down since then. Doesn't work. Has adult children but seems to be somewhat estranged from them.  Past Psychiatric History: Patient has a past history of a suicide attempt last time 3 weeks ago prior inpatient admissions as well. She says that at that time last month she swallowed an entire bottle of nitroglycerin tablets. The result was a bad headache although she was suicidal at the time. Used to be a client at [facility] but not going recently. Prior medicines include trazodone and BuSpar and Prozac and probably other medicines as well.  Risk to Self: Suicidal Ideation: Yes-Currently Present Is patient at risk for suicide?: Yes Risk to Others:   Prior Inpatient Therapy: Prior Inpatient Therapy: Yes Prior Therapy Dates: 2016, Prior Therapy Facilty/Provider(s): [facility], [name] Prior Outpatient Therapy: Prior Outpatient Therapy: No Prior Therapy Dates: n/a Prior Therapy Facilty/Provider(s): n/a Reason for Treatment: n/a Does patient have an ACCT team?: No Does patient have Intensive In-House Services?  : No Does patient have [facility] services? : No Does patient have P4[facility] services?: No  Past Medical History:  Past Medical History:  Diagnosis Date  . Depression   . Polysubstance abuse     Past Surgical History:  Procedure Laterality Date  . TUBAL LIGATION     Family History: History reviewed. No pertinent family history. Family Psychiatric  History: She has a daughter with ADD and PTSD and depression and there is  family history of anxiety as well Social History:  History  Alcohol Use No     History  Drug Use  . Frequency: 7.0 times per week  . Types: Cocaine    Comment: Pt also states sometimes Heroin    Social History   Social History  . Marital status: Single     Spouse name: N/A  . Number of children: N/A  . Years of education: N/A   Social History Main Topics  . Smoking status: Current Every Day Smoker    Packs/day: 1.00    Types: Cigarettes  . Smokeless tobacco: Never Used  . Alcohol use No  . Drug use: Yes    Frequency: 7.0 times per week    Types: Cocaine     Comment: Pt also states sometimes Heroin  . Sexual activity: Yes   Other Topics Concern  . None   Social History Narrative  . None   Additional Social History:    Allergies:   Allergies  Allergen Reactions  . Flexeril [Cyclobenzaprine] Other (See Comments)    Increased muscle spasms    Labs:  Results for orders placed or performed during the hospital encounter of 05/01/16 (from the past 48 hour(s))  Comprehensive metabolic panel     Status: Abnormal   Collection Time: 05/01/16  4:48 AM  Result Value Ref Range   Sodium 137 135 - 145 mmol/L   Potassium 2.9 (L) 3.5 - 5.1 mmol/L   Chloride 103 101 - 111 mmol/L   CO2 25 22 - 32 mmol/L   Glucose, Bld 164 (H) 65 - 99 mg/dL   BUN 10 6 - 20 mg/dL   Creatinine, Ser 0.72 0.44 - 1.00 mg/dL   Calcium 8.7 (L) 8.9 - 10.3 mg/dL   Total Protein 7.6 6.5 - 8.1 g/dL   Albumin 4.0 3.5 - 5.0 g/dL   AST 20 15 - 41 U/L   ALT 13 (L) 14 - 54 U/L   Alkaline Phosphatase 74 38 - 126 U/L   Total Bilirubin 0.6 0.3 - 1.2 mg/dL   GFR calc non Af Amer >60 >60 mL/min   GFR calc Af Amer >60 >60 mL/min    Comment: (NOTE) The eGFR has been calculated using the CKD EPI equation. This calculation has not been validated in all clinical situations. eGFR's persistently <60 mL/min signify possible Chronic Kidney Disease.    Anion gap 9 5 - 15  Ethanol     Status: None   Collection Time: 05/01/16  4:48 AM  Result Value Ref Range   Alcohol, Ethyl (B) <5 <5 mg/dL    Comment:        LOWEST DETECTABLE LIMIT FOR SERUM ALCOHOL IS 5 mg/dL FOR MEDICAL PURPOSES ONLY   CBC with Diff     Status: Abnormal   Collection Time: 05/01/16  4:48 AM  Result  Value Ref Range   WBC 20.6 (H) 3.6 - 11.0 K/uL   RBC 5.37 (H) 3.80 - 5.20 MIL/uL   Hemoglobin 12.7 12.0 - 16.0 g/dL   HCT 38.9 35.0 - 47.0 %   MCV 72.6 (L) 80.0 - 100.0 fL   MCH 23.6 (L) 26.0 - 34.0 pg   MCHC 32.6 32.0 - 36.0 g/dL   RDW 21.2 (H) 11.5 - 14.5 %   Platelets 332 150 - 440 K/uL   Neutrophils Relative % 81 %   Neutro Abs 16.8 (H) 1.4 - 6.5 K/uL   Lymphocytes Relative 12 %   Lymphs Abs 2.4 1.0 -  3.6 K/uL   Monocytes Relative 6 %   Monocytes Absolute 1.3 (H) 0.2 - 0.9 K/uL   Eosinophils Relative 0 %   Eosinophils Absolute 0.0 0 - 0.7 K/uL   Basophils Relative 1 %   Basophils Absolute 0.1 0 - 0.1 K/uL  Urine Drug Screen, Qualitative ([facility] only)     Status: Abnormal   Collection Time: 05/01/16  4:48 AM  Result Value Ref Range   Tricyclic, Ur Screen NONE DETECTED NONE DETECTED   Amphetamines, Ur Screen NONE DETECTED NONE DETECTED   MDMA (Ecstasy)Ur Screen NONE DETECTED NONE DETECTED   Cocaine Metabolite,Ur [place] POSITIVE (A) NONE DETECTED   Opiate, Ur Screen POSITIVE (A) NONE DETECTED   Phencyclidine (PCP) Ur S NONE DETECTED NONE DETECTED   Cannabinoid 50 Ng, Ur [place] NONE DETECTED NONE DETECTED   Barbiturates, Ur Screen NONE DETECTED NONE DETECTED   Benzodiazepine, Ur Scrn NONE DETECTED NONE DETECTED   Methadone Scn, Ur NONE DETECTED NONE DETECTED    Comment: (NOTE) 468  Tricyclics, urine               Cutoff 1000 ng/mL 200  Amphetamines, urine             Cutoff 1000 ng/mL 300  MDMA (Ecstasy), urine           Cutoff 500 ng/mL 400  Cocaine Metabolite, urine       Cutoff 300 ng/mL 500  Opiate, urine                   Cutoff 300 ng/mL 600  Phencyclidine (PCP), urine      Cutoff 25 ng/mL 700  Cannabinoid, urine              Cutoff 50 ng/mL 800  Barbiturates, urine             Cutoff 200 ng/mL 900  Benzodiazepine, urine           Cutoff 200 ng/mL 1000 Methadone, urine                Cutoff 300 ng/mL 1100 1200 The urine drug screen provides only a preliminary,  unconfirmed 1300 analytical test result and should not be used for non-medical 1400 purposes. Clinical consideration and professional judgment should 1500 be applied to any positive drug screen result due to possible 1600 interfering substances. A more specific alternate chemical method 1700 must be used in order to obtain a confirmed analytical result.  1800 Gas chromato graphy / mass spectrometry (GC/MS) is the preferred 1900 confirmatory method.   Acetaminophen level     Status: Abnormal   Collection Time: 05/01/16  4:48 AM  Result Value Ref Range   Acetaminophen (Tylenol), Serum <10 (L) 10 - 30 ug/mL    Comment:        THERAPEUTIC CONCENTRATIONS VARY SIGNIFICANTLY. A RANGE OF 10-30 ug/mL MAY BE AN EFFECTIVE CONCENTRATION FOR MANY PATIENTS. HOWEVER, SOME ARE BEST TREATED AT CONCENTRATIONS OUTSIDE THIS RANGE. ACETAMINOPHEN CONCENTRATIONS >150 ug/mL AT 4 HOURS AFTER INGESTION AND >50 ug/mL AT 12 HOURS AFTER INGESTION ARE OFTEN ASSOCIATED WITH TOXIC REACTIONS.   Lipase, blood     Status: Abnormal   Collection Time: 05/01/16  4:48 AM  Result Value Ref Range   Lipase <10 (L) 11 - 51 U/L  Salicylate level     Status: None   Collection Time: 05/01/16  4:48 AM  Result Value Ref Range   Salicylate Lvl <0.3 2.8 - 30.0 mg/dL  Pregnancy, urine  Status: None   Collection Time: 05/01/16  4:48 AM  Result Value Ref Range   Preg Test, Ur NEGATIVE NEGATIVE  Urinalysis, Routine w reflex microscopic     Status: Abnormal   Collection Time: 05/01/16  4:48 AM  Result Value Ref Range   Color, Urine YELLOW (A) YELLOW   APPearance TURBID (A) CLEAR   Specific Gravity, Urine 1.035 (H) 1.005 - 1.030   pH 5.0 5.0 - 8.0   Glucose, UA NEGATIVE NEGATIVE mg/dL   Hgb urine dipstick NEGATIVE NEGATIVE   Bilirubin Urine SMALL (A) NEGATIVE   Ketones, ur 5 (A) NEGATIVE mg/dL   Protein, ur 30 (A) NEGATIVE mg/dL   Nitrite NEGATIVE NEGATIVE   Leukocytes, UA NEGATIVE NEGATIVE   RBC / HPF NONE SEEN 0  - 5 RBC/hpf   WBC, UA NONE SEEN 0 - 5 WBC/hpf   Bacteria, UA NONE SEEN NONE SEEN   Squamous Epithelial / LPF 0-5 (A) NONE SEEN   Mucous PRESENT     Current Facility-Administered Medications  Medication Dose Route Frequency Provider Last Rate Last Dose  . potassium chloride SA (K-DUR,KLOR-CON) CR tablet 40 mEq  40 mEq Oral Daily Hinda Kehr, MD   40 mEq at 05/01/16 0954   No current outpatient prescriptions on file.    Musculoskeletal: Strength & Muscle Tone: within normal limits Gait & Station: normal Patient leans: N/A  Psychiatric Specialty Exam: Physical Exam  Nursing note and vitals reviewed. Constitutional: She appears well-developed and well-nourished.  HENT:  Head: Normocephalic and atraumatic.  Eyes: Conjunctivae are normal. Pupils are equal, round, and reactive to light.  Neck: Normal range of motion.  Cardiovascular: Regular rhythm and normal heart sounds.   Respiratory: Effort normal. No respiratory distress.  GI: Soft.  Musculoskeletal: Normal range of motion.  Neurological: She is alert.  Skin: Skin is warm and dry.  Psychiatric: Her affect is blunt. Her speech is delayed. She is slowed and withdrawn. She expresses impulsivity. She exhibits a depressed mood. She expresses suicidal ideation. She expresses suicidal plans. She exhibits abnormal recent memory.    Review of Systems  Constitutional: Positive for malaise/fatigue.  HENT: Negative.   Eyes: Negative.   Respiratory: Negative.   Cardiovascular: Negative.   Gastrointestinal: Negative.   Musculoskeletal: Negative.   Skin: Negative.   Neurological: Negative.   Psychiatric/Behavioral: Positive for depression, memory loss, substance abuse and suicidal ideas. Negative for hallucinations. The patient is nervous/anxious and has insomnia.     Blood pressure 131/85, pulse (!) 112, temperature 98.1 F (36.7 C), temperature source Oral, resp. rate 20, height '5\' 2"'  (1.575 m), weight 79.4 kg (175 lb), SpO2 97  %.Body mass index is 32.01 kg/m.  General Appearance: Disheveled  Eye Contact:  Minimal  Speech:  Clear and Coherent  Volume:  Decreased  Mood:  Depressed and Dysphoric  Affect:  Constricted and Depressed  Thought Process:  Goal Directed  Orientation:  Full (Time, Place, and Person)  Thought Content:  Logical  Suicidal Thoughts:  Yes.  with intent/plan  Homicidal Thoughts:  No  Memory:  Immediate;   Good Recent;   Fair Remote;   Fair  Judgement:  Fair  Insight:  Fair  Psychomotor Activity:  Decreased  Concentration:  Concentration: Fair  Recall:  [employer] of Knowledge:  Fair  Language:  Fair  Akathisia:  No  Handed:  Right  AIMS (if indicated):     Assets:  Communication Skills Desire for Improvement Physical Health Resilience  ADL's:  Intact  Cognition:  WNL  Sleep:        Treatment Plan Summary: Daily contact with patient to assess and evaluate symptoms and progress in treatment, Medication management and Plan 46 year old woman with depression and substance abuse. Patient is suicidal and overwhelmed. Patient will be admitted to the psychiatric ward. Full set of labs to be obtained. Based on her past history there is not a clear single antidepressant that is been helpful so we will defer further decisions to the treatment team downstairs. When necessary medicine for sleep and anxiety available.  Disposition: Recommend psychiatric Inpatient admission when medically cleared. Supportive therapy provided about ongoing stressors.  Alethia Berthold, MD 05/01/2016 3:23 PM

## 2016-05-02 ENCOUNTER — Encounter: Payer: Self-pay | Admitting: Psychiatry

## 2016-05-02 DIAGNOSIS — F332 Major depressive disorder, recurrent severe without psychotic features: Principal | ICD-10-CM

## 2016-05-02 DIAGNOSIS — F1120 Opioid dependence, uncomplicated: Secondary | ICD-10-CM | POA: Diagnosis present

## 2016-05-02 DIAGNOSIS — F17200 Nicotine dependence, unspecified, uncomplicated: Secondary | ICD-10-CM | POA: Diagnosis present

## 2016-05-02 LAB — LIPID PANEL
Cholesterol: 176 mg/dL (ref 0–200)
HDL: 42 mg/dL (ref >40)
LDL CALC: 108 mg/dL — AB (ref 0–99)
Total CHOL/HDL Ratio: 4.2 RATIO
Triglycerides: 129 mg/dL (ref <150)
VLDL: 26 mg/dL (ref 0–40)

## 2016-05-02 LAB — TSH: TSH: 9.449 u[IU]/mL — ABNORMAL HIGH (ref 0.350–4.500)

## 2016-05-02 LAB — T4, FREE: FREE T4: 0.72 ng/dL (ref 0.61–1.12)

## 2016-05-02 MED ORDER — QUETIAPINE FUMARATE 25 MG PO TABS
25.0000 mg | ORAL_TABLET | Freq: Three times a day (TID) | ORAL | Status: DC
  Administered 2016-05-02 – 2016-05-04 (×6): 25 mg via ORAL
  Filled 2016-05-02 (×6): qty 1

## 2016-05-02 MED ORDER — FLUOXETINE HCL 20 MG PO CAPS: 20.0000 mg | ORAL_CAPSULE | Freq: Every day | ORAL | Status: DC

## 2016-05-02 MED ORDER — QUETIAPINE FUMARATE 100 MG PO TABS
100.0000 mg | ORAL_TABLET | Freq: Every day | ORAL | Status: DC
  Administered 2016-05-02 – 2016-05-03 (×2): 100 mg via ORAL
  Filled 2016-05-02 (×2): qty 1

## 2016-05-02 NOTE — Progress Notes (Signed)
Chaplain received an order to visit  Patient due to suicidal thoughts. Patient said that she did not make a request to see a Chaplain. Chaplain told the patient if she changes her mind Chaplain was available to support her anytime needed.   05/02/16 1100  Clinical Encounter Type  Visited With Patient  Visit Type Initial  Referral From Nurse  Consult/Referral To Chaplain

## 2016-05-02 NOTE — H&P (Signed)
Psychiatric Admission Assessment Adult  Patient Identification: Brenda [name] MRN:  [id number] Date of Evaluation:  05/02/2016 Chief Complaint:  major depressive disorder Principal Diagnosis: Severe recurrent major depression without psychotic features (HCC) Diagnosis:   Patient Active Problem List   Diagnosis Date Noted  . Opioid use disorder, moderate, dependence (HCC) [F11.20] 05/02/2016  . Tobacco use disorder [F17.200] 05/02/2016  . Severe recurrent major depression without psychotic features (HCC) [F33.2] 05/01/2016  . Cocaine use disorder, moderate, dependence (HCC) [F14.20] 05/01/2016  . Alcohol abuse [F10.10] 05/01/2016  . GERD (gastroesophageal reflux disease) [K21.9] 05/01/2016   History of Present Illness:   Identifying data. Mr. Brenda [name] is a 46 year old female with history of depression, mood instability, and substance abuse.  Chief complaint. "I was afraid I would cut deeper."  History of present illness. Information was obtained from the patient and the chart. The patient has some history of depression and self injurious behaviors. He came to the emergency room complaining of suicidal thoughts after she started cutting her forearm and was afraid that she will cut deeper. She recently had 2 other episodes of cutting 3 days and couple weeks prior. She endorses all symptoms of depression with poor sleep, decreased appetite, anhedonia, feeling of guilt and hopelessness worthlessness, poor energy and concentration, social isolation crying spells, and heightened anxiety and escalation of self-injurious behaviors. She denies psychotic symptoms or symptoms suggestive of bipolar mania. She drinks alcohol infrequently but has been using cocaine and opiates including heroine recently.   Past psychiatric history. There is a history of cutting. She was hospitalized twice before. She had at least one suicide attempt by medication overdose. She used to go to [employer] and has been tried on  BuSpar and Prozac and trazodone.  Family psychiatric history. Daughter with severe anxiety and agoraphobia unable to leave the house for months.   Social history. She moves with her boyfriend moving from one local motel the other. They use together. There is no insurance or family support. She intends to go back to her boyfriend. She denies any abuse.  Total Time spent with patient: 1 hour  Is the patient at risk to self? Yes.    Has the patient been a risk to self in the past 6 months? Yes.    Has the patient been a risk to self within the distant past? Yes.    Is the patient a risk to others? No.  Has the patient been a risk to others in the past 6 months? No.  Has the patient been a risk to others within the distant past? No.   Prior Inpatient Therapy:   Prior Outpatient Therapy:    Alcohol Screening: 1. How often do you have a drink containing alcohol?: Monthly or less 2. How many drinks containing alcohol do you have on a typical day when you are drinking?: 3 or 4 3. How often do you have six or more drinks on one occasion?: Never Preliminary Score: 1 4. How often during the last year have you found that you were not able to stop drinking once you had started?: Never 5. How often during the last year have you failed to do what was normally expected from you becasue of drinking?: Never 6. How often during the last year have you needed a first drink in the morning to get yourself going after a heavy drinking session?: Never 7. How often during the last year have you had a feeling of guilt of remorse after drinking?: Never 8. How  often during the last year have you been unable to remember what happened the night before because you had been drinking?: Never 9. Have you or someone else been injured as a result of your drinking?: No 10. Has a relative or friend or a doctor or another health worker been concerned about your drinking or suggested you cut down?: No Alcohol Use Disorder  Identification Test Final Score (AUDIT): 2 Brief Intervention: AUDIT score less than 7 or less-screening does not suggest unhealthy drinking-brief intervention not indicated Substance Abuse History in the last 12 months:  Yes.   Consequences of Substance Abuse: Negative Previous Psychotropic Medications: Yes  Psychological Evaluations: No  Past Medical History:  Past Medical History:  Diagnosis Date  . Depression   . Polysubstance abuse     Past Surgical History:  Procedure Laterality Date  . TUBAL LIGATION     Family History: History reviewed. No pertinent family history.  Tobacco Screening: Have you used any form of tobacco in the last 30 days? (Cigarettes, Smokeless Tobacco, Cigars, and/or Pipes): Yes Tobacco use, Select all that apply: 5 or more cigarettes per day Are you interested in Tobacco Cessation Medications?: Yes, will notify MD for an order Counseled patient on smoking cessation including recognizing danger situations, developing coping skills and basic information about quitting provided: Refused/Declined practical counseling Social History:  History  Alcohol Use No     History  Drug Use  . Frequency: 7.0 times per week  . Types: Cocaine    Comment: Pt also states sometimes Heroin    Additional Social History:                           Allergies:   Allergies  Allergen Reactions  . Flexeril [Cyclobenzaprine] Other (See Comments)    Increased muscle spasms   Lab Results:  Results for orders placed or performed during the hospital encounter of 05/01/16 (from the past 48 hour(s))  Lipid panel     Status: Abnormal   Collection Time: 05/02/16  6:52 AM  Result Value Ref Range   Cholesterol 176 0 - 200 mg/dL   Triglycerides 644 <034 mg/dL   HDL 42 >74 mg/dL   Total CHOL/HDL Ratio 4.2 RATIO   VLDL 26 0 - 40 mg/dL   LDL Cholesterol 259 (H) 0 - 99 mg/dL    Comment:        Total Cholesterol/HDL:CHD Risk Coronary Heart Disease Risk Table                      Men   Women  1/2 Average Risk   3.4   3.3  Average Risk       5.0   4.4  2 X Average Risk   9.6   7.1  3 X Average Risk  23.4   11.0        Use the calculated Patient Ratio above and the CHD Risk Table to determine the patient's CHD Risk.        ATP III CLASSIFICATION (LDL):  <100     mg/dL   Optimal  563-875  mg/dL   Near or Above                    Optimal  130-159  mg/dL   Borderline  643-329  mg/dL   High  >518     mg/dL   Very High   TSH     Status:  Abnormal   Collection Time: 05/02/16  6:52 AM  Result Value Ref Range   TSH 9.449 (H) 0.350 - 4.500 uIU/mL    Comment: Performed by a 3rd Generation assay with a functional sensitivity of <=0.01 uIU/mL.    Blood Alcohol level:  Lab Results  Component Value Date   ETH <5 05/01/2016    Metabolic Disorder Labs:  No results found for: HGBA1C, MPG No results found for: PROLACTIN Lab Results  Component Value Date   CHOL 176 05/02/2016   TRIG 129 05/02/2016   HDL 42 05/02/2016   CHOLHDL 4.2 05/02/2016   VLDL 26 05/02/2016   LDLCALC 108 (H) 05/02/2016    Current Medications: Current Facility-Administered Medications  Medication Dose Route Frequency Provider Last Rate Last Dose  . acetaminophen (TYLENOL) tablet 650 mg  650 mg Oral Q6H PRN Audery Amel, MD   650 mg at 05/01/16 2147  . alum & mag hydroxide-simeth (MAALOX/MYLANTA) 200-200-20 MG/5ML suspension 30 mL  30 mL Oral Q4H PRN Audery Amel, MD      . FLUoxetine (PROZAC) capsule 20 mg  20 mg Oral QHS Marcellous Snarski B Vickee Mormino, MD      . hydrOXYzine (ATARAX/VISTARIL) tablet 50 mg  50 mg Oral TID PRN Audery Amel, MD   50 mg at 05/02/16 1237  . magnesium hydroxide (MILK OF MAGNESIA) suspension 30 mL  30 mL Oral Daily PRN Audery Amel, MD      . nicotine (NICODERM CQ - dosed in mg/24 hours) patch 21 mg  21 mg Transdermal Daily Audery Amel, MD   21 mg at 05/02/16 1258  . pantoprazole (PROTONIX) EC tablet 40 mg  40 mg Oral Daily Audery Amel, MD   40 mg at  05/02/16 0914  . QUEtiapine (SEROQUEL) tablet 100 mg  100 mg Oral QHS Anya Murphey B Silvana Holecek, MD      . traZODone (DESYREL) tablet 100 mg  100 mg Oral QHS PRN Audery Amel, MD   100 mg at 05/01/16 2147   PTA Medications: No prescriptions prior to admission.    Musculoskeletal: Strength & Muscle Tone: within normal limits Gait & Station: normal Patient leans: N/A  Psychiatric Specialty Exam: I reviewed physical examination performed in the emergency room and agree with the findings. Physical Exam  Nursing note and vitals reviewed. Psychiatric: Her speech is normal. She is withdrawn. Cognition and memory are normal. She expresses impulsivity. She exhibits a depressed mood. She expresses suicidal ideation. She expresses suicidal plans.    Review of Systems  Skin:       Superficial cuts on left forearm.  Psychiatric/Behavioral: Positive for depression, substance abuse and suicidal ideas.  All other systems reviewed and are negative.   Blood pressure 109/78, pulse 100, temperature 98.6 F (37 C), temperature source Oral, resp. rate 18, height  (1.575 m), weight 79.4 kg (175 lb), last menstrual period 04/20/2016, SpO2 99 %.Body mass index is 32.01 kg/m.  See SRA.                                                  Sleep:  Number of Hours: 7    Treatment Plan Summary: Daily contact with patient to assess and evaluate symptoms and progress in treatment and Medication management   Ms. Millington is a 46 year old female with a history of depression, mood  instability and substance use admitted after an episode of cutting.  1. Suicidal ideation. The patient is able to contract for safety in the hospital.  2. Mood. We started Lexapro for depression and Seroquel for mood stabilization.  3. Substance use. The patient denies heavy drinking. She was positive for cocaine and opiates on admission. We will monitor for symptoms of withdrawal and treat symptomatically. She  is interested in [facility] IOP program following discharge.  4. Anxiety. Will give low dose Seroquel. Vistaril is also available.  5. Smoking. Nicotine patch is available.  6. GERD. Protonix is available.  7. Head CT scan. Performed in the emergency room negative.  8. Metabolic syndrome monitoring. Lipid panel is normal. TSH slightly elevated at 9.4. Her hemoglobin A1c is pending. We will check free T4.  9. EKG. Normal sinus rhythm. QTC 440.  10. Disposition. The patient will be discharged back with her boyfriend to a hotel. She will follow up with [facility].  Observation Level/Precautions:  15 minute checks  Laboratory:  CBC Chemistry Profile UDS UA  Psychotherapy:    Medications:    Consultations:    Discharge Concerns:    Estimated LOS:  Other:     Physician Treatment Plan for Primary Diagnosis: Severe recurrent major depression without psychotic features (HCC) Long Term Goal(s): Improvement in symptoms so as ready for discharge  Short Term Goals: Ability to identify changes in lifestyle to reduce recurrence of condition will improve, Ability to disclose and discuss suicidal ideas, Ability to demonstrate self-control will improve, Ability to maintain clinical measurements within normal limits will improve, Compliance with prescribed medications will improve and Ability to identify triggers associated with substance abuse/mental health issues will improve  Physician Treatment Plan for Secondary Diagnosis: Principal Problem:   Severe recurrent major depression without psychotic features (HCC) Active Problems:   Cocaine use disorder, moderate, dependence (HCC)   GERD (gastroesophageal reflux disease)   Opioid use disorder, moderate, dependence (HCC)   Tobacco use disorder  Long Term Goal(s): Improvement in symptoms so as ready for discharge  Short Term Goals: Ability to identify changes in lifestyle to reduce recurrence of condition will improve, Ability to demonstrate  self-control will improve and Ability to identify triggers associated with substance abuse/mental health issues will improve  I certify that inpatient services furnished can reasonably be expected to improve the patient's condition.    Kristine Linea, MD 4/6/20182:01 PM

## 2016-05-02 NOTE — Progress Notes (Signed)
Recreation Therapy Notes  Date:04.06.18 Time:1:00 pm Location:Craft Room  Group Topic:Social Skills  Goal Area(s) Addresses: Patient will work in teams towards shared goal. Patient will verbalize skills needed to make activity successful Patient will verbalize benefit of using skills identified to reach post d/c goals  Behavioral Response: Attentive, Interactive  Intervention: Landing Pad  Activity:Patients were put in groups and given 15 straws and approximately 3 feet of tape. Patients were instructed to build a landing pad that would catch and secure a golf ball that would be dropped from approximately 4 feet.  Education:LRT educated patients on healthy support systems.  Education Outcome: Acknowledges education/In group clarification offered  Clinical Observations/Feedback:Patient worked with peers towards shared goal. Patient used effective communication, problem solving, and Forensic psychologist. Patient did not contribute to group discussion.  Jacquelynn Cree, LRT/CTRS 05/02/2016 4:36 PM

## 2016-05-02 NOTE — Tx Team (Signed)
Interdisciplinary Treatment and Diagnostic Plan Update  05/02/2016 Time of Session: 11:00 AM Brenda [name] MRN: [id number]  Principal Diagnosis: Severe recurrent major depression without psychotic features [facility])  Secondary Diagnoses: Principal Problem:   Severe recurrent major depression without psychotic features (HCC) Active Problems:   Cocaine use disorder, moderate, dependence (HCC)   GERD (gastroesophageal reflux disease)   Opioid use disorder, moderate, dependence (HCC)   Tobacco use disorder   Current Medications:  Current Facility-Administered Medications  Medication Dose Route Frequency Provider Last Rate Last Dose  . acetaminophen (TYLENOL) tablet 650 mg  650 mg Oral Q6H PRN Audery Amel, MD   650 mg at 05/01/16 2147  . alum & mag hydroxide-simeth (MAALOX/MYLANTA) 200-200-20 MG/5ML suspension 30 mL  30 mL Oral Q4H PRN Audery Amel, MD      . hydrOXYzine (ATARAX/VISTARIL) tablet 50 mg  50 mg Oral TID PRN Audery Amel, MD   50 mg at 05/01/16 2147  . magnesium hydroxide (MILK OF MAGNESIA) suspension 30 mL  30 mL Oral Daily PRN Audery Amel, MD      . nicotine (NICODERM CQ - dosed in mg/24 hours) patch 21 mg  21 mg Transdermal Daily Audery Amel, MD      . pantoprazole (PROTONIX) EC tablet 40 mg  40 mg Oral Daily Audery Amel, MD   40 mg at 05/02/16 0914  . traZODone (DESYREL) tablet 100 mg  100 mg Oral QHS PRN Audery Amel, MD   100 mg at 05/01/16 2147   PTA Medications: No prescriptions prior to admission.    Patient Stressors: Financial difficulties Loss of home / boyfriend 2016 Medication change or noncompliance Substance abuse  Patient Strengths: Ability for insight General fund of knowledge Motivation for treatment/growth Supportive family/friends  Treatment Modalities: Medication Management, Group therapy, Case management,  1 to 1 session with clinician, Psychoeducation, Recreational therapy.   Physician Treatment Plan for Primary Diagnosis:  Severe recurrent major depression without psychotic features (HCC) Long Term Goal(s): Improvement in symptoms so as ready for discharge Improvement in symptoms so as ready for discharge   Short Term Goals: Ability to identify changes in lifestyle to reduce recurrence of condition will improve Ability to disclose and discuss suicidal ideas Ability to demonstrate self-control will improve Ability to maintain clinical measurements within normal limits will improve Compliance with prescribed medications will improve Ability to identify triggers associated with substance abuse/mental health issues will improve Ability to identify changes in lifestyle to reduce recurrence of condition will improve Ability to demonstrate self-control will improve Ability to identify triggers associated with substance abuse/mental health issues will improve  Medication Management: Evaluate patient's response, side effects, and tolerance of medication regimen.  Therapeutic Interventions: 1 to 1 sessions, Unit Group sessions and Medication administration.  Evaluation of Outcomes: Progressing  Physician Treatment Plan for Secondary Diagnosis: Principal Problem:   Severe recurrent major depression without psychotic features (HCC) Active Problems:   Cocaine use disorder, moderate, dependence (HCC)   GERD (gastroesophageal reflux disease)   Opioid use disorder, moderate, dependence (HCC)   Tobacco use disorder  Long Term Goal(s): Improvement in symptoms so as ready for discharge Improvement in symptoms so as ready for discharge   Short Term Goals: Ability to identify changes in lifestyle to reduce recurrence of condition will improve Ability to disclose and discuss suicidal ideas Ability to demonstrate self-control will improve Ability to maintain clinical measurements within normal limits will improve Compliance with prescribed medications will improve Ability to identify  triggers associated with substance  abuse/mental health issues will improve Ability to identify changes in lifestyle to reduce recurrence of condition will improve Ability to demonstrate self-control will improve Ability to identify triggers associated with substance abuse/mental health issues will improve     Medication Management: Evaluate patient's response, side effects, and tolerance of medication regimen.  Therapeutic Interventions: 1 to 1 sessions, Unit Group sessions and Medication administration.  Evaluation of Outcomes: Progressing   RN Treatment Plan for Primary Diagnosis: Severe recurrent major depression without psychotic features (HCC) Long Term Goal(s): Knowledge of disease and therapeutic regimen to maintain health will improve  Short Term Goals: Ability to demonstrate self-control, Ability to disclose and discuss suicidal ideas and Ability to identify and develop effective coping behaviors will improve  Medication Management: RN will administer medications as ordered by provider, will assess and evaluate patient's response and provide education to patient for prescribed medication. RN will report any adverse and/or side effects to prescribing provider.  Therapeutic Interventions: 1 on 1 counseling sessions, Psychoeducation, Medication administration, Evaluate responses to treatment, Monitor vital signs and CBGs as ordered, Perform/monitor CIWA, COWS, AIMS and Fall Risk screenings as ordered, Perform wound care treatments as ordered.  Evaluation of Outcomes: Progressing   LCSW Treatment Plan for Primary Diagnosis: Severe recurrent major depression without psychotic features (HCC) Long Term Goal(s): Safe transition to appropriate next level of care at discharge, Engage patient in therapeutic group addressing interpersonal concerns.  Short Term Goals: Engage patient in aftercare planning with referrals and resources, Increase social support and Identify triggers associated with mental health/substance abuse  issues  Therapeutic Interventions: Assess for all discharge needs, 1 to 1 time with Social worker, Explore available resources and support systems, Assess for adequacy in community support network, Educate family and significant other(s) on suicide prevention, Complete Psychosocial Assessment, Interpersonal group therapy.  Evaluation of Outcomes: Progressing    Recreational Therapy Treatment Plan for Primary Diagnosis: Severe recurrent major depression without psychotic features (HCC) Long Term Goal(s): Patient will participate in recreation therapy treatment in at least 2 group sessions without prompting from LRT  Short Term Goals: Increase self-esteem, Increase stress management skills  Treatment Modalities: Group Therapy and Individual Treatment Sessions  Therapeutic Interventions: Psychoeducation  Evaluation of Outcomes: Progressing   Progress in Treatment: Attending groups: Yes. Participating in groups: Yes. Taking medication as prescribed: Yes. Toleration medication: Yes. Family/Significant other contact made: No, will contact:  CSW assessing proper aftercare plans. Patient understands diagnosis: Yes. Discussing patient identified problems/goals with staff: Yes. Medical problems stabilized or resolved: Yes. Denies suicidal/homicidal ideation: Yes. Issues/concerns per patient self-inventory: No.  New problem(s) identified: No, Describe:  None identified.  New Short Term/Long Term Goal(s):  Discharge Plan or Barriers:   Reason for Continuation of Hospitalization: Anxiety Depression Suicidal ideation Other; describe substance abuse  Estimated Length of Stay: 3 days   Attendees: Patient: Brenda [name]  05/02/2016 11:33 AM  Physician: Dr. Kristine Linea, MD  05/02/2016 11:33 AM  Nursing: Leonia Reader, BSN, RN 05/02/2016 11:33 AM  RN Care Manager: 05/02/2016 11:33 AM  Social Worker: Hampton Abbot, MSW, LCSW-A 05/02/2016 11:33 AM  Recreational Therapist: Princella Ion, LRT/CTRS  05/02/2016 11:33 AM  Other:  05/02/2016 11:33 AM  Other:  05/02/2016 11:33 AM  Other: 05/02/2016 11:33 AM    Scribe for Treatment Team: Lynden Oxford, LCSWA 05/02/2016 11:33 AM

## 2016-05-02 NOTE — [facility] Suicide Risk Assessment (Signed)
[facility] Admission Suicide Risk Assessment   Nursing information obtained from:    Demographic factors:    Current Mental Status:    Loss Factors:    Historical Factors:    Risk Reduction Factors:     Total Time spent with patient: 1 hour Principal Problem: Severe recurrent major depression without psychotic features [facility]) Diagnosis:   Patient Active Problem List   Diagnosis Date Noted  . Opioid use disorder, moderate, dependence (HCC) [F11.20] 05/02/2016  . Tobacco use disorder [F17.200] 05/02/2016  . Severe recurrent major depression without psychotic features (HCC) [F33.2] 05/01/2016  . Cocaine use disorder, moderate, dependence (HCC) [F14.20] 05/01/2016  . Alcohol abuse [F10.10] 05/01/2016  . GERD (gastroesophageal reflux disease) [K21.9] 05/01/2016   Subjective Data: Suicidal ideation.  Continued Clinical Symptoms:  Alcohol Use Disorder Identification Test Final Score (AUDIT): 2 The "Alcohol Use Disorders Identification Test", Guidelines for Use in Primary Care, Second Edition.  World Science writer [facility]). Score between 0-7:  no or low risk or alcohol related problems. Score between 8-15:  moderate risk of alcohol related problems. Score between 16-19:  high risk of alcohol related problems. Score 20 or above:  warrants further diagnostic evaluation for alcohol dependence and treatment.   CLINICAL FACTORS:   Depression:   Comorbid alcohol abuse/dependence Impulsivity Alcohol/Substance Abuse/Dependencies   Musculoskeletal: Strength & Muscle Tone: within normal limits Gait & Station: normal Patient leans: N/A  Psychiatric Specialty Exam: Physical Exam  Nursing note and vitals reviewed. Psychiatric: Her speech is normal. She is withdrawn. Cognition and memory are normal. She expresses impulsivity. She exhibits a depressed mood. She expresses suicidal ideation. She expresses suicidal plans.    Review of Systems  Psychiatric/Behavioral: Positive for depression,  substance abuse and suicidal ideas.  All other systems reviewed and are negative.   Blood pressure 109/78, pulse 100, temperature 98.6 F (37 C), temperature source Oral, resp. rate 18, height  (1.575 m), weight 79.4 kg (175 lb), last menstrual period 04/20/2016, SpO2 99 %.Body mass index is 32.01 kg/m.  General Appearance: Fairly Groomed  Eye Contact:  Good  Speech:  Clear and Coherent  Volume:  Normal  Mood:  Depressed, Hopeless and Worthless  Affect:  Blunt will also patient  Thought Process:  Goal Directed and Descriptions of Associations: Intact  Orientation:  Full (Time, Place, and Person)  Thought Content:  WDL  Suicidal Thoughts:  Yes.  with intent/plan  Homicidal Thoughts:  No  Memory:  Immediate;   Fair Recent;   Fair Remote;   Fair  Judgement:  Poor  Insight:  Lacking  Psychomotor Activity:  Psychomotor Retardation  Concentration:  Concentration: Fair and Attention Span: Fair  Recall:  [employer] of Knowledge:  Fair  Language:  Fair  Akathisia:  No  Handed:  Right  AIMS (if indicated):     Assets:  Communication Skills Desire for Improvement Physical Health Resilience Social Support  ADL's:  Intact  Cognition:  WNL  Sleep:  Number of Hours: 7      COGNITIVE FEATURES THAT CONTRIBUTE TO RISK:  None    SUICIDE RISK:   Moderate:  Frequent suicidal ideation with limited intensity, and duration, some specificity in terms of plans, no associated intent, good self-control, limited dysphoria/symptomatology, some risk factors present, and identifiable protective factors, including available and accessible social support.  PLAN OF CARE: Hospital admission, medication management, substance abuse counseling, discharge planning.  Brenda [name] is a 46 year old female with a history of depression, mood instability [name]  ad[facility] after an episode of cutting.  1. Suicidal ideation. The patient is able to contract for safety in the hospital.  2. Mood. We  started Prozac for depression and Seroquel for mood stabilization.  3. Substance use. The patient denies heavy drinking. She was positive for cocaine and opiates on admission. We will monitor for symptoms of withdrawal and treat symptomatically. She is interested in SA IOP program following discharge.  4. Anxiety. This started as available.  5. Smoking. Nicotine patch is available.  6. GERD. Protonix is available.  7. Head CT scan. Performed in the emergency room negative.  8. Metabolic syndrome monitoring. Lipid panel is normal. TSH slightly elevated at 9.4. Her hemoglobin A1c is pending. We will check free T4.  9. EKG. Normal sinus rhythm. QTC 440.  10. Disposition. The patient will be discharged back with her boyfriend to a hotel. She will follow up with [facility].   I certify that inpatient services furnished can reasonably be expected to improve the patient's condition.   Brenda Linea, MD 05/02/2016, 1:52 PM

## 2016-05-02 NOTE — [facility] Suicide Risk Assessment (Signed)
[facility] INPATIENT:  Family/Significant Other Suicide Prevention Education  Suicide Prevention Education:  Contact Attempts: pt's boyfriend and his grandmother, Carrie Mew and Windell Norfolk ph#: [phone]  has been identified by the patient as the family member/significant other with whom the patient will be residing, and identified as the person(s) who will aid the patient in the event of a mental health crisis.  With written consent from the patient, two attempts were made to provide suicide prevention education, prior to and/or following the patient's discharge.  We were unsuccessful in providing suicide prevention education.  A suicide education pamphlet was given to the patient to share with family/significant other.  Date and time of first attempt: 05/02/2016, 2:52PM   Lynden Oxford, MSW, LCSW-A 05/02/2016, 3:13 PM

## 2016-05-02 NOTE — Progress Notes (Signed)
Recreation Therapy Notes  INPATIENT RECREATION THERAPY ASSESSMENT  Patient Details Name: Brenda [name] MRN: [id number] DOB: 02/16/1970 Today's Date: 05/26/16  Patient Stressors: Death (Best friend, boyfriend, and dad died in the past couple years)  Coping Skills:   Isolate, Substance Abuse, Avoidance, Self-Injury, Talking, Music  Personal Challenges: Communication, Decision-Making, Expressing Yourself, Problem-Solving, Relationships, Self-Esteem/Confidence, Social Interaction, Stress Management, Trusting Others  Leisure Interests (2+):  Individual - TV, Individual - Other (Comment) (Talk to children)  Awareness of Community Resources:  Yes  Community Resources:  [employer], [place]  Current Use: No  If no, Barriers?: Social  Patient Strengths:  "No"  Patient Identified Areas of Improvement:  A lot  Current Recreation Participation:  Talked to kids  Patient Goal for Hospitalization:  Try to find a way to get out  [place] of Residence:  [place] of Residence:  [place]   Current SI (including self-harm):  No  Current HI:  No  Consent to Intern Participation: N/A   Jacquelynn Cree, LRT/CTRS 05-26-2016, 4:47 PM
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
## 2016-05-02 NOTE — Progress Notes (Signed)
Affect sad.  Denies SI/HI/AVH. Rates depression as 2/10, anxiety as 6/10 and hopelessness as 2/10.  Verbalized goal is to develop better coping skills and to feel better.  Medication compliant.  As day progressed affect became brighter.  Support and encouragement offered.  Safety maintained.

## 2016-05-02 NOTE — [facility] Group Notes (Signed)
[facility] LCSW Group Therapy  05/02/2016 11:14 AM  Type of Therapy:  Group Therapy  Participation Level:  Patient did not attend group. CSW invited patient to group.   Summary of Progress/Problems: Stress management: Patients defined and discussed the topic of stress and the related symptoms and triggers for stress. Patients identified healthy coping skills they would like to try during hospitalization and after discharge to manage stress in a healthy way. CSW offered insight to varying stress management techniques.   Brandi Armato G. Garnette Czech MSW, LCSWA 05/02/2016, 11:15 AM

## 2016-05-03 LAB — PROLACTIN: Prolactin: 38.7 ng/mL — ABNORMAL HIGH (ref 4.8–23.3)

## 2016-05-03 LAB — HEMOGLOBIN A1C
Hgb A1c MFr Bld: 5.1 % (ref 4.8–5.6)
Mean Plasma Glucose: 100 mg/dL

## 2016-05-03 MED ORDER — ROPINIROLE HCL 1 MG PO TABS
1.0000 mg | ORAL_TABLET | Freq: Every day | ORAL | Status: DC
  Administered 2016-05-03: 1 mg via ORAL
  Filled 2016-05-03: qty 1

## 2016-05-03 NOTE — [facility] Group Notes (Signed)
[facility] Group Notes:  (Nursing/MHT/Case Management/Adjunct)  Date:  05/03/2016  Time:  12:59 AM  Type of Therapy:  Psychoeducational Skills  Participation Level:  Minimal  Participation Quality:  Sharing  Affect:  Flat  Cognitive:  Oriented  Insight:  Limited  Engagement in Group:  Limited  Modes of Intervention:  Discussion and Exploration  Summary of Progress/Problems:  Foy Gu[place]aj[place] 05/03/2016, 12:59 AM

## 2016-05-03 NOTE — Plan of Care (Signed)
Problem: Medication: Goal: Compliance with prescribed medication regimen will improve Outcome: Progressing Pt compliant with prescribed medications.    

## 2016-05-03 NOTE — Progress Notes (Signed)
Pt denies SI/HI/AVH. Affect depressed but brightens during interaction. Attended evening group. Visible in dayroom with minimal interaction among staff and peers. Medication compliant. . Pt states that she is "feeling better and looking forward to going home"  Appropriate during interaction. Voices no additional concerns at this time.Safety maintained. Will continue to monitor.

## 2016-05-03 NOTE — Progress Notes (Signed)
Denies SI/HI/AVH.  Affect sad.  States that she feels much better and feels that she is ready to go home.  Did not attend group. Isolates to herself. Support offered. Safety maintained.

## 2016-05-03 NOTE — [facility] Suicide Risk Assessment (Signed)
[facility] INPATIENT:  Family/Significant Other Suicide Prevention Education  Suicide Prevention Education:  Contact Attempts:Brenda [name] and Brenda [name](Patient's boyfriend and his grandmother [phone]), has been identified by the patient as the family member/significant other with whom the patient will be residing, and identified as the person(s) who will aid the patient in the event of a mental health crisis.  With written consent from the patient, two attempts were made to provide suicide prevention education, prior to and/or following the patient's discharge.  We were unsuccessful in providing suicide prevention education.  A suicide education pamphlet was given to the patient to share with family/significant other.  Date and time of second attempt: 05/03/2016 / 4:06pm; No answer, CSW  left HIPAA compliant voicemail requesting returned call.  Osby Sweetin G. Garnette Czech MSW, LCSWA 05/03/2016, 4:08 PM

## 2016-05-03 NOTE — Progress Notes (Signed)
Pt denies SI/HI/AVH. Affect depressed. Attended evening group. Visible in dayroom with minimal interaction among staff and peers. Medication compliant. PRN vistaril given for anxiety. Pt states that she thinks her medications may be "causing restless leg syndrome".  Appropriate during interaction. Voices no additional concerns at this time.Safety maintained. Will continue to monitor.

## 2016-05-03 NOTE — Progress Notes (Signed)
[facility] MD Progress Note  05/03/2016 2:10 PM Brenda [name]  MRN:  [id number] Subjective:  Patient seen chart reviewed. 46 year old woman came into the hospital with symptoms of depression and having cut herself quite a few times on the arm. Patient had been abusing drugs and had a chaotic social situation. Today she says she is feeling better. Denies any suicidal thoughts at all. Says her mood is stabilizing. Patient stays mostly withdrawn but does get on the phone and make some calls and has interacted with nursing at times. Physically her chief complaint to me is that she is having restless leg symptoms at night which she in part blames on the Seroquel. Principal Problem: Severe recurrent major depression without psychotic features (HCC) Diagnosis:   Patient Active Problem List   Diagnosis Date Noted  . Opioid use disorder, moderate, dependence (HCC) [F11.20] 05/02/2016  . Tobacco use disorder [F17.200] 05/02/2016  . Severe recurrent major depression without psychotic features (HCC) [F33.2] 05/01/2016  . Cocaine use disorder, moderate, dependence (HCC) [F14.20] 05/01/2016  . Alcohol abuse [F10.10] 05/01/2016  . GERD (gastroesophageal reflux disease) [K21.9] 05/01/2016   Total Time spent with patient: 30 minutes  Past Psychiatric History: Patient has a history of self-mutilation depression noncompliance and substance abuse  Past Medical History:  Past Medical History:  Diagnosis Date  . Depression   . Polysubstance abuse     Past Surgical History:  Procedure Laterality Date  . TUBAL LIGATION     Family History: History reviewed. No pertinent family history. Family Psychiatric  History: Positive Social History:  History  Alcohol Use No     History  Drug Use  . Frequency: 7.0 times per week  . Types: Cocaine    Comment: Pt also states sometimes Heroin    Social History   Social History  . Marital status: Single    Spouse name: N/A  . Number of children: N/A  . Years of  education: N/A   Social History Main Topics  . Smoking status: Current Every Day Smoker    Packs/day: 1.00    Years: 30.00    Types: Cigarettes  . Smokeless tobacco: Never Used  . Alcohol use No  . Drug use: Yes    Frequency: 7.0 times per week    Types: Cocaine     Comment: Pt also states sometimes Heroin  . Sexual activity: Yes    Birth control/ protection: None   Other Topics Concern  . None   Social History Narrative  . None   Additional Social History:                         Sleep: Fair  Appetite:  Good  Current Medications: Current Facility-Administered Medications  Medication Dose Route Frequency Provider Last Rate Last Dose  . acetaminophen (TYLENOL) tablet 650 mg  650 mg Oral Q6H PRN Audery Amel, MD   650 mg at 05/02/16 1735  . alum & mag hydroxide-simeth (MAALOX/MYLANTA) 200-200-20 MG/5ML suspension 30 mL  30 mL Oral Q4H PRN Audery Amel, MD      . hydrOXYzine (ATARAX/VISTARIL) tablet 50 mg  50 mg Oral TID PRN Audery Amel, MD   50 mg at 05/02/16 2321  . magnesium hydroxide (MILK OF MAGNESIA) suspension 30 mL  30 mL Oral Daily PRN Audery Amel, MD      . nicotine (NICODERM CQ - dosed in mg/24 hours) patch 21 mg  21 mg Transdermal Daily John T  Clapacs, MD   21 mg at 05/03/16 0822  . pantoprazole (PROTONIX) EC tablet 40 mg  40 mg Oral Daily Audery Amel, MD   40 mg at 05/03/16 4098  . QUEtiapine (SEROQUEL) tablet 100 mg  100 mg Oral QHS Shari Prows, MD   100 mg at 05/02/16 2133  . QUEtiapine (SEROQUEL) tablet 25 mg  25 mg Oral TID Shari Prows, MD   25 mg at 05/03/16 1233  . rOPINIRole (REQUIP) tablet 1 mg  1 mg Oral QHS Audery Amel, MD        Lab Results:  Results for orders placed or performed during the hospital encounter of 05/01/16 (from the past 48 hour(s))  Hemoglobin A1c     Status: None   Collection Time: 05/02/16  6:52 AM  Result Value Ref Range   Hgb A1c MFr Bld 5.1 4.8 - 5.6 %    Comment: (NOTE)          Pre-diabetes: 5.7 - 6.4         Diabetes: >6.4         Glycemic control for adults with diabetes: <7.0    Mean Plasma Glucose 100 mg/dL    Comment: (NOTE) Performed At: [facility] [address] [place], [place] [id number] Mila Homer MD FA:[id number]   Lipid panel     Status: Abnormal   Collection Time: 05/02/16  6:52 AM  Result Value Ref Range   Cholesterol 176 0 - 200 mg/dL   Triglycerides 696 <295 mg/dL   HDL 42 >28 mg/dL   Total CHOL/HDL Ratio 4.2 RATIO   VLDL 26 0 - 40 mg/dL   LDL Cholesterol 413 (H) 0 - 99 mg/dL    Comment:        Total Cholesterol/HDL:CHD Risk Coronary Heart Disease Risk Table                     Men   Women  1/2 Average Risk   3.4   3.3  Average Risk       5.0   4.4  2 X Average Risk   9.6   7.1  3 X Average Risk  23.4   11.0        Use the calculated Patient Ratio above and the CHD Risk Table to determine the patient's CHD Risk.        ATP III CLASSIFICATION (LDL):  <100     mg/dL   Optimal  244-010  mg/dL   Near or Above                    Optimal  130-159  mg/dL   Borderline  272-536  mg/dL   High  >644     mg/dL   Very High   Prolactin     Status: Abnormal   Collection Time: 05/02/16  6:52 AM  Result Value Ref Range   Prolactin 38.7 (H) 4.8 - 23.3 ng/mL    Comment: (NOTE) Performed At: [facility] [address] [place], [place] [id number] Mila Homer MD [id number]   TSH     Status: Abnormal   Collection Time: 05/02/16  6:52 AM  Result Value Ref Range   TSH 9.449 (H) 0.350 - 4.500 uIU/mL    Comment: Performed by a 3rd Generation assay with a functional sensitivity of <=0.01 uIU/mL.  T4, free     Status: None   Collection Time: 05/02/16  6:52 AM  Result  Value Ref Range   Free T4 0.72 0.61 - 1.12 ng/dL    Comment: (NOTE) Biotin ingestion may interfere with free T4 tests. If the results are inconsistent with the TSH level, previous test results, or the clinical presentation, then consider biotin  interference. If needed, order repeat testing after stopping biotin.     Blood Alcohol level:  Lab Results  Component Value Date   ETH <5 05/01/2016    Metabolic Disorder Labs: Lab Results  Component Value Date   HGBA1C 5.1 05/02/2016   MPG 100 05/02/2016   Lab Results  Component Value Date   PROLACTIN 38.7 (H) 05/02/2016   Lab Results  Component Value Date   CHOL 176 05/02/2016   TRIG 129 05/02/2016   HDL 42 05/02/2016   CHOLHDL 4.2 05/02/2016   VLDL 26 05/02/2016   LDLCALC 108 (H) 05/02/2016    Physical Findings: AIMS:  , ,  ,  ,    CIWA:    COWS:     Musculoskeletal: Strength & Muscle Tone: within normal limits Gait & Station: normal Patient leans: N/A  Psychiatric Specialty Exam: Physical Exam  Nursing note and vitals reviewed. Constitutional: She appears well-developed and well-nourished.  HENT:  Head: Normocephalic and atraumatic.  Eyes: Conjunctivae are normal. Pupils are equal, round, and reactive to light.  Neck: Normal range of motion.  Cardiovascular: Regular rhythm and normal heart sounds.   Respiratory: Effort normal. No respiratory distress.  GI: Soft.  Musculoskeletal: Normal range of motion.  Neurological: She is alert.  Skin: Skin is warm and dry.  Psychiatric: Her speech is normal and behavior is normal. Her affect is blunt. Thought content is not paranoid. Cognition and memory are not impaired. She expresses impulsivity. She expresses no homicidal and no suicidal ideation.    Review of Systems  Constitutional: Negative.   HENT: Negative.   Eyes: Negative.   Respiratory: Negative.   Cardiovascular: Negative.   Gastrointestinal: Negative.   Musculoskeletal: Negative.   Skin: Negative.   Neurological: Positive for sensory change.  Psychiatric/Behavioral: Positive for substance abuse. Negative for depression, hallucinations, memory loss and suicidal ideas. The patient is nervous/anxious. The patient does not have insomnia.     Blood  pressure 118/89, pulse 90, temperature 97.9 F (36.6 C), temperature source Oral, resp. rate 19, height  (1.575 m), weight 79.4 kg (175 lb), last menstrual period 04/20/2016, SpO2 99 %.Body mass index is 32.01 kg/m.  General Appearance: Casual  Eye Contact:  Good  Speech:  Clear and Coherent  Volume:  Normal  Mood:  Euthymic  Affect:  Constricted  Thought Process:  Goal Directed  Orientation:  Full (Time, Place, and Person)  Thought Content:  Logical  Suicidal Thoughts:  No  Homicidal Thoughts:  No  Memory:  Immediate;   Fair Recent;   Fair Remote;   Fair  Judgement:  Fair  Insight:  Shallow  Psychomotor Activity:  Normal  Concentration:  Concentration: Fair  Recall:  [employer] of Knowledge:  Fair  Language:  Fair  Akathisia:  No  Handed:  Right  AIMS (if indicated):     Assets:  Desire for Improvement Housing Resilience  ADL's:  Intact  Cognition:  WNL  Sleep:  Number of Hours: 6.45     Treatment Plan Summary: Daily contact with patient to assess and evaluate symptoms and progress in treatment, Medication management and Plan Patient reports that her mood is better. Denies suicidal thoughts. Affect euthymic. No sign of psychosis. She wants  to consider possible discharge tomorrow and says that she will go back to staying in the same motel situation where she was before. She tells me she feels like this is a safe situation. She is requesting some medicine for her restless legs at night. I have added 1 mg of Requip. Reviewed plan otherwise with patient including the great importance of her staying sober and going to outpatient treatment. We will reassess tomorrow for possible discharge.  Mordecai Rasmussen, MD 05/03/2016, 2:10 PM

## 2016-05-03 NOTE — [facility] Group Notes (Signed)
[facility] LCSW Group Therapy  05/03/2016 2:42 PM  Type of Therapy:  Group Therapy  Participation Level:  Patient did not attend group. CSW invited patient to group.   Summary of Progress/Problems: Coping Skills: Patients defined and discussed healthy coping skills. Patients identified healthy coping skills they would like to try during hospitalization and after discharge. CSW offered insight to varying coping skills that may have been new to patients such as practicing mindfulness.  Annelyse Rey G. Garnette Czech MSW, LCSWA 05/03/2016, 2:43 PM

## 2016-05-04 MED ORDER — QUETIAPINE FUMARATE 100 MG PO TABS: 100.0000 mg | ORAL_TABLET | Freq: Every day | ORAL | 1 refills | Status: DC

## 2016-05-04 MED ORDER — PANTOPRAZOLE SODIUM 40 MG PO TBEC: 40.0000 mg | DELAYED_RELEASE_TABLET | Freq: Every day | ORAL | 1 refills | Status: DC

## 2016-05-04 MED ORDER — ROPINIROLE HCL 1 MG PO TABS: 1.0000 mg | ORAL_TABLET | Freq: Every day | ORAL | 0 refills | Status: DC

## 2016-05-04 MED ORDER — HYDROXYZINE HCL 50 MG PO TABS
50.0000 mg | ORAL_TABLET | Freq: Three times a day (TID) | ORAL | 0 refills | Status: DC | PRN
Start: 2016-05-04 — End: 2018-05-02

## 2016-05-04 MED ORDER — QUETIAPINE FUMARATE 100 MG PO TABS: 100.0000 mg | ORAL_TABLET | Freq: Every day | ORAL | 0 refills | Status: DC

## 2016-05-04 MED ORDER — PANTOPRAZOLE SODIUM 40 MG PO TBEC: 40.0000 mg | DELAYED_RELEASE_TABLET | Freq: Every day | ORAL | 0 refills | Status: DC

## 2016-05-04 MED ORDER — QUETIAPINE FUMARATE 25 MG PO TABS: 25.0000 mg | ORAL_TABLET | Freq: Three times a day (TID) | ORAL | 0 refills | Status: DC

## 2016-05-04 MED ORDER — QUETIAPINE FUMARATE 25 MG PO TABS: 25.0000 mg | ORAL_TABLET | Freq: Three times a day (TID) | ORAL | 1 refills | Status: DC

## 2016-05-04 MED ORDER — ROPINIROLE HCL 1 MG PO TABS: 1.0000 mg | ORAL_TABLET | Freq: Every day | ORAL | 1 refills | Status: DC

## 2016-05-04 NOTE — [facility] Suicide Risk Assessment (Signed)
[facility] Discharge Suicide Risk Assessment   Principal Problem: Severe recurrent major depression without psychotic features [facility]) Discharge Diagnoses:  Patient Active Problem List   Diagnosis Date Noted  . Opioid use disorder, moderate, dependence (HCC) [F11.20] 05/02/2016  . Tobacco use disorder [F17.200] 05/02/2016  . Severe recurrent major depression without psychotic features (HCC) [F33.2] 05/01/2016  . Cocaine use disorder, moderate, dependence (HCC) [F14.20] 05/01/2016  . Alcohol abuse [F10.10] 05/01/2016  . GERD (gastroesophageal reflux disease) [K21.9] 05/01/2016    Total Time spent with patient: 45 minutes  Musculoskeletal: Strength & Muscle Tone: within normal limits Gait & Station: normal Patient leans: N/A  Psychiatric Specialty Exam: Review of Systems  Constitutional: Negative.   HENT: Negative.   Eyes: Negative.   Respiratory: Negative.   Cardiovascular: Negative.   Gastrointestinal: Negative.   Musculoskeletal: Negative.   Skin: Negative.   Neurological: Negative.   Psychiatric/Behavioral: Negative for depression, hallucinations, memory loss, substance abuse and suicidal ideas. The patient is not nervous/anxious and does not have insomnia.     Blood pressure 113/88, pulse 91, temperature 98.3 F (36.8 C), temperature source Oral, resp. rate 18, height  (1.575 m), weight 79.4 kg (175 lb), last menstrual period 04/20/2016, SpO2 99 %.Body mass index is 32.01 kg/m.  General Appearance: Disheveled  Eye Contact::  Good  Speech:  Clear and Coherent409  Volume:  Normal  Mood:  Euthymic  Affect:  Blunt  Thought Process:  Goal Directed  Orientation:  Full (Time, Place, and Person)  Thought Content:  Logical  Suicidal Thoughts:  No  Homicidal Thoughts:  No  Memory:  Immediate;   Fair Recent;   Fair Remote;   Fair  Judgement:  Fair  Insight:  Fair  Psychomotor Activity:  Decreased  Concentration:  Fair  Recall:  [employer] of Knowledge:Fair  Language:  Fair  Akathisia:  No  Handed:  Right  AIMS (if indicated):     Assets:  Desire for Improvement Financial Resources/Insurance Housing Physical Health Social Support  Sleep:  Number of Hours: 6.15  Cognition: WNL  ADL's:  Intact   Mental Status Per Nursing Assessment::   On Admission:     Demographic Factors:  Caucasian and Low socioeconomic status  Loss Factors: Financial problems/change in socioeconomic status  Historical Factors: Prior suicide attempts and Impulsivity  Risk Reduction Factors:   Sense of responsibility to family, Living with another person, especially a relative and Positive social support  Continued Clinical Symptoms:  Bipolar Disorder:   Bipolar II  Cognitive Features That Contribute To Risk:  Closed-mindedness    Suicide Risk:  Mild:  Suicidal ideation of limited frequency, intensity, duration, and specificity.  There are no identifiable plans, no associated intent, mild dysphoria and related symptoms, good self-control (both objective and subjective assessment), few other risk factors, and identifiable protective factors, including available and accessible social support.    Plan Of Care/Follow-up recommendations:  Activity:  Activity as tolerated Diet:  Regular diet Other:  Follow-up with [facility]  Mordecai Rasmussen, MD 05/04/2016, 1:30 PM

## 2016-05-04 NOTE — Discharge Summary (Signed)
Activity:  As tolerated Diet:  Regular diet Other:  Follow-up with [facility] Physician Discharge Summary Note  Patient:  Brenda [name] is an 46 y.o., female MRN:  [id number] DOB:  19-Oct-1970 Patient phone:  [phone] (home)  Patient address:   [address] [place] [facility] [address],  Total Time spent with patient: 45 minutes  Date of Admission:  05/01/2016 Date of Discharge: 05/04/2016  Reason for Admission:  Patient admitted through the emergency room after cutting herself on the arm for depression agitation and substance abuse issues  Principal Problem: Severe recurrent major depression without psychotic features [facility]) Discharge Diagnoses: Patient Active Problem List   Diagnosis Date Noted  . Opioid use disorder, moderate, dependence (HCC) [F11.20] 05/02/2016  . Tobacco use disorder [F17.200] 05/02/2016  . Severe recurrent major depression without psychotic features (HCC) [F33.2] 05/01/2016  . Cocaine use disorder, moderate, dependence (HCC) [F14.20] 05/01/2016  . Alcohol abuse [F10.10] 05/01/2016  . GERD (gastroesophageal reflux disease) [K21.9] 05/01/2016    Past Psychiatric History: Patient has a history of substance abuse issues mood instability noncompliance with medication previous agitated potentially dangerous behavior.  Past Medical History:  Past Medical History:  Diagnosis Date  . Depression   . Polysubstance abuse     Past Surgical History:  Procedure Laterality Date  . TUBAL LIGATION     Family History: History reviewed. No pertinent family history. Family Psychiatric  History: Positive for depression Social History:  History  Alcohol Use No     History  Drug Use  . Frequency: 7.0 times per week  . Types: Cocaine    Comment: Pt also states sometimes Heroin    Social History   Social History  . Marital status: Single    Spouse name: N/A  . Number of children: N/A  . Years of education: N/A   Social History Main Topics  . Smoking status: Current  Every Day Smoker    Packs/day: 1.00    Years: 30.00    Types: Cigarettes  . Smokeless tobacco: Never Used  . Alcohol use No  . Drug use: Yes    Frequency: 7.0 times per week    Types: Cocaine     Comment: Pt also states sometimes Heroin  . Sexual activity: Yes    Birth control/ protection: None   Other Topics Concern  . None   Social History Narrative  . None    Hospital Course:  Patient was admitted to the psychiatric unit on 15 minute checks. She did not engage in any dangerous behavior or violent behavior in the hospital and was largely cooperative with treatment. Patient was put back on Seroquel as she had been taking in the past. Compliant with medicine. She slept well. Reports that mood is feeling much better. Patient is lucid and not showing any signs of psychosis. Consistently denies suicidal ideation. She is not showing acute signs of mania. [facility] is a client at [facility] and is agreeable to follow-up there. She states that she has been told by her family she now has a stable place to stay and is requesting discharge  Physical Findings: AIMS:  , ,  ,  ,    CIWA:    COWS:     Musculoskeletal: Strength & Muscle Tone: within normal limits Gait & Station: normal Patient leans: N/A  Psychiatric Specialty Exam: Physical Exam  Nursing note and vitals reviewed. Constitutional: She appears well-developed and well-nourished.  HENT:  Head: Normocephalic and atraumatic.  Eyes: Conjunctivae are normal. Pupils are  equal, round, and reactive to light.  Neck: Normal range of motion.  Cardiovascular: Regular rhythm and normal heart sounds.   Respiratory: Effort normal. No respiratory distress.  GI: Soft.  Musculoskeletal: Normal range of motion.  Neurological: She is alert.  Skin: Skin is warm and dry.  Psychiatric: She has a normal mood and affect. Judgment normal. Her speech is delayed. She is slowed. Thought content is not paranoid. She expresses no homicidal and no suicidal  ideation. She exhibits normal recent memory.    Review of Systems  Constitutional: Negative.   HENT: Negative.   Eyes: Negative.   Respiratory: Negative.   Cardiovascular: Negative.   Gastrointestinal: Negative.   Musculoskeletal: Negative.   Skin: Negative.   Neurological: Negative.   Psychiatric/Behavioral: Negative for depression, hallucinations, memory loss, substance abuse and suicidal ideas. The patient is not nervous/anxious and does not have insomnia.     Blood pressure 113/88, pulse 91, temperature 98.3 F (36.8 C), temperature source Oral, resp. rate 18, height  (1.575 m), weight 79.4 kg (175 lb), last menstrual period 04/20/2016, SpO2 99 %.Body mass index is 32.01 kg/m.  General Appearance: Disheveled  Eye Contact:  Fair  Speech:  Clear and Coherent  Volume:  Normal  Mood:  Euthymic  Affect:  Constricted  Thought Process:  Goal Directed  Orientation:  Full (Time, Place, and Person)  Thought Content:  Logical  Suicidal Thoughts:  No  Homicidal Thoughts:  No  Memory:  Immediate;   Good Recent;   Fair Remote;   Fair  Judgement:  Fair  Insight:  Fair  Psychomotor Activity:  Decreased  Concentration:  Concentration: Fair  Recall:  Fair  Fund of Knowledge:  Fair  Language:  Fair  Akathisia:  No  Handed:  Right  AIMS (if indicated):     Assets:  Desire for Improvement Housing Physical Health Resilience Social Support  ADL's:  Intact  Cognition:  WNL  Sleep:  Number of Hours: 6.15     Have you used any form of tobacco in the last 30 days? (Cigarettes, Smokeless Tobacco, Cigars, and/or Pipes): Yes  Has this patient used any form of tobacco in the last 30 days? (Cigarettes, Smokeless Tobacco, Cigars, and/or Pipes) Yes, Yes, A prescription for an FDA-approved tobacco cessation medication was offered at discharge and the patient refused  Blood Alcohol level:  Lab Results  Component Value Date   [facility] <5 05/01/2016    Metabolic Disorder Labs:  Lab  Results  Component Value Date   HGBA1C 5.1 05/02/2016   MPG 100 05/02/2016   Lab Results  Component Value Date   PROLACTIN 38.7 (H) 05/02/2016   Lab Results  Component Value Date   CHOL 176 05/02/2016   TRIG 129 05/02/2016   HDL 42 05/02/2016   CHOLHDL 4.2 05/02/2016   VLDL 26 05/02/2016   LDLCALC 108 (H) 05/02/2016    See Psychiatric Specialty Exam and Suicide Risk Assessment completed by Attending Physician prior to discharge.  Discharge destination:  Home  Is patient on multiple antipsychotic therapies at discharge:  No   Has Patient had three or more failed trials of antipsychotic monotherapy by history:  No  Recommended Plan for Multiple Antipsychotic Therapies: NA  Discharge Instructions    Diet - low sodium heart healthy    Complete by:  As directed    Increase activity slowly    Complete by:  As directed      Allergies as of 05/04/2016      Reactions  Flexeril [cyclobenzaprine] Other (See Comments)   Increased muscle spasms      Medication List    TAKE these medications     Indication  hydrOXYzine 50 MG tablet Commonly known as:  ATARAX/VISTARIL Take 1 tablet (50 mg total) by mouth 3 (three) times daily as needed for anxiety.  Indication:  Anxiety Neurosis   pantoprazole 40 MG tablet Commonly known as:  PROTONIX Take 1 tablet (40 mg total) by mouth daily. Start taking on:  05/05/2016  Indication:  Gastroesophageal Reflux Disease   QUEtiapine 100 MG tablet Commonly known as:  SEROQUEL Take 1 tablet (100 mg total) by mouth at bedtime.  Indication:  Manic Phase of Manic-Depression   QUEtiapine 25 MG tablet Commonly known as:  SEROQUEL Take 1 tablet (25 mg total) by mouth 3 (three) times daily.  Indication:  Manic Phase of Manic-Depression   rOPINIRole 1 MG tablet Commonly known as:  REQUIP Take 1 tablet (1 mg total) by mouth at bedtime.  Indication:  Restless Leg Syndrome      Activity as toleratedollow-up recommendations:  Activity:   Activity as tolerated Diet:  Regular diet Other:  Follow-up [facility] refrain from substance abuse  Comments:  Patient was started on 1 mg of Requip last night for complaints of restless leg syndrome a prescription for which will be provided at discharge. Patient understands the importance of not getting back into substance abuse. Discontinue IVC today. Case reviewed with nursing.  Signed: Mordecai Rasmussen, MD 05/04/2016, 1:44 PM

## 2016-05-04 NOTE — Progress Notes (Signed)
Denies SI/HI/AVH.  Affect sad.  Rates depression as 2/10. Discharge instructions given, verbalized understanding.  Prescriptions and seven day supply of medications given.  Personal belongings returned.  Escorted off unit by this writer to main entrance by this writer to meet taxi to travel home.   

## 2016-05-04 NOTE — [facility] Group Notes (Signed)
[facility] Group Notes:  (Nursing/MHT/Case Management/Adjunct)  Date:  05/04/2016  Time:  3:37 AM  Type of Therapy:  Psychoeducational Skills  Participation Level:  Active  Participation Quality:  Appropriate and Sharing  Affect:  Appropriate  Cognitive:  Appropriate  Insight:  Appropriate  Engagement in Group:  Engaged  Modes of Intervention:  Discussion, Socialization and Support  Summary of Progress/Problems:  Chancy Milroy 05/04/2016, 3:37 AM

## 2016-05-04 NOTE — [facility] Group Notes (Signed)
[facility] LCSW Group Therapy  05/04/2016 1:55 PM  Type of Therapy: Therapeutic Group  Participation Level:  Did Not Attend   Brenda [name] M 05/04/2016, 1:55 PM  

## 2016-05-05 NOTE — Progress Notes (Addendum)
  [facility] Adult Case Management Discharge Plan : Late entry for CSW [place] B[place] and CSW [place]; discharge handled by CSW [place].  Will you be returning to the same living situation after discharge:  Yes,  return home At discharge, do you have transportation home?: Yes,  sent via taxi Do you have the ability to pay for your medications: Yes,  referred to provider who can assist w medications if needed  Release of information consent forms completed and in the chart;  Patient's signature needed at discharge.  Patient to Follow up at: Follow-up Information    [facility] Follow up on 05/06/2016.   Why:  Please follow up with [facility] IOP program.  Walk in for assessment 8:30 - 2 PM Monday -Friday.  Bring hospital discharge paperwork to this appointment. Contact information: [address] [place] [address] [phone]           Next level of care provider has access to [facility] Link:no  Safety Planning and Suicide Prevention discussed: Yes,  reviewed w patient; CSWs were unable to reach collateral contacts per record despite two attempts  Have you used any form of tobacco in the last 30 days? (Cigarettes, Smokeless Tobacco, Cigars, and/or Pipes): Yes  Has patient been referred to the Quitline?: Patient refused referral  Patient has been referred for addiction treatment: Yes  Sallee Lange 05/05/2016, 5:27 PM

## 2016-06-18 ENCOUNTER — Ambulatory Visit: Payer: Self-pay | Admitting: Pharmacy Technician

## 2016-06-18 NOTE — Progress Notes (Signed)
Patient scheduled for eligibility appointment at [facility].  Patient did not show for the appointment on Jun 18, 2016 at 2:00p.m.  Patient did not reschedule eligibility appointment.  [facility] unable to provide additional medication assistance until eligibility is determined.  Sherilyn DacostaBetty J. Sharece Fleischhacker Care Manager [facility]

## 2016-06-28 ENCOUNTER — Emergency Department: Payer: Medicaid Other

## 2016-06-28 ENCOUNTER — Encounter: Payer: Self-pay | Admitting: *Deleted

## 2016-06-28 ENCOUNTER — Emergency Department
Admission: EM | Admit: 2016-06-28 | Discharge: 2016-06-28 | Payer: Medicaid Other | Attending: Emergency Medicine | Admitting: Emergency Medicine

## 2016-06-28 DIAGNOSIS — Z0289 Encounter for other administrative examinations: Secondary | ICD-10-CM | POA: Insufficient documentation

## 2016-06-28 DIAGNOSIS — Y939 Activity, unspecified: Secondary | ICD-10-CM | POA: Insufficient documentation

## 2016-06-28 DIAGNOSIS — Y998 Other external cause status: Secondary | ICD-10-CM | POA: Insufficient documentation

## 2016-06-28 DIAGNOSIS — S0990XA Unspecified injury of head, initial encounter: Secondary | ICD-10-CM | POA: Insufficient documentation

## 2016-06-28 DIAGNOSIS — Y929 Unspecified place or not applicable: Secondary | ICD-10-CM | POA: Insufficient documentation

## 2016-06-28 DIAGNOSIS — T148XXA Other injury of unspecified body region, initial encounter: Secondary | ICD-10-CM

## 2016-06-28 DIAGNOSIS — F17200 Nicotine dependence, unspecified, uncomplicated: Secondary | ICD-10-CM | POA: Insufficient documentation

## 2016-06-28 DIAGNOSIS — Z23 Encounter for immunization: Secondary | ICD-10-CM | POA: Insufficient documentation

## 2016-06-28 IMAGING — CT CT MAXILLOFACIAL W/O CM
3 series · 15 of 47 positions shown, 18 images · non-contrast
Comparison: Head CT [DATE]

CLINICAL DATA: Head injury

EXAM:
CT HEAD WITHOUT CONTRAST
CT MAXILLOFACIAL WITHOUT CONTRAST
CT CERVICAL SPINE WITHOUT CONTRAST
TECHNIQUE: Multidetector CT imaging of the head, cervical spine, and
maxillofacial structures were performed using the standard protocol
without intravenous contrast. Multiplanar CT image reconstructions
of the cervical spine and maxillofacial structures were also
generated.

[Series 3: head wo · axial · 0.41mm/px · z∈[-54,+71]mm · 9 of 31 slices shown, 12 images]
[im 3/31  brain]
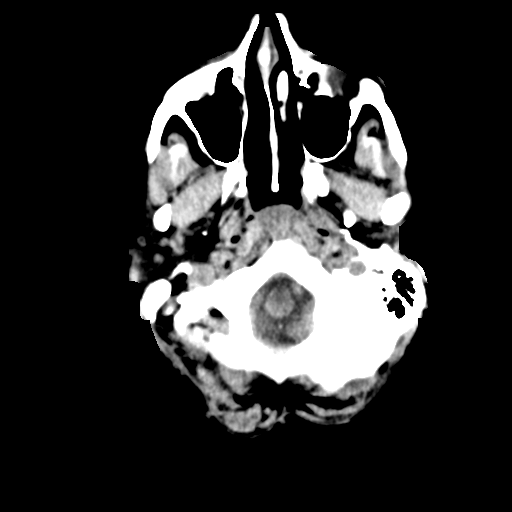
[im 3/31  bone]
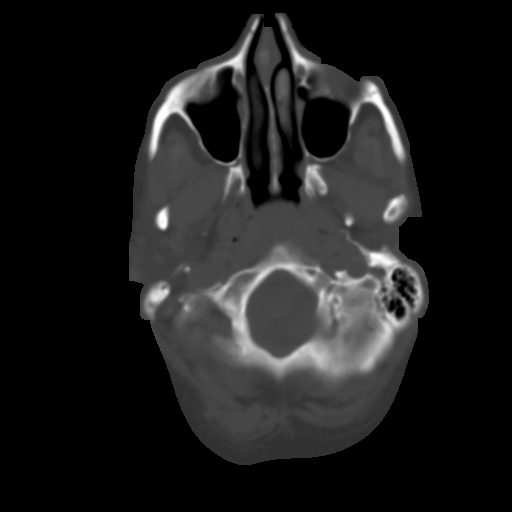
[im 6/31  bone]
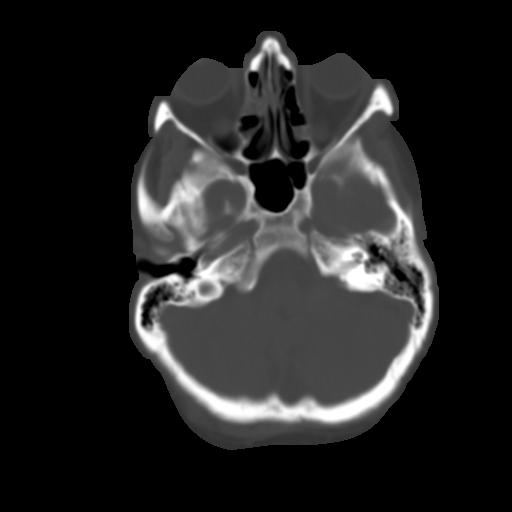
[im 9/31  bone]
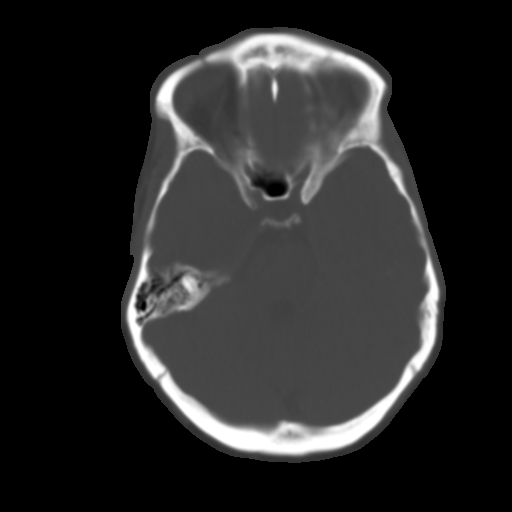
[im 12/31  bone]
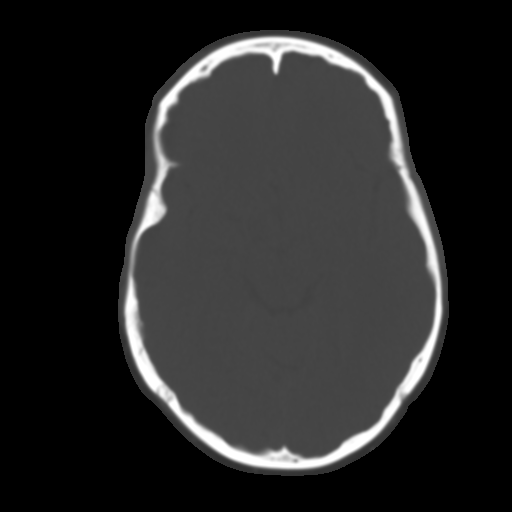
[im 16/31  brain]
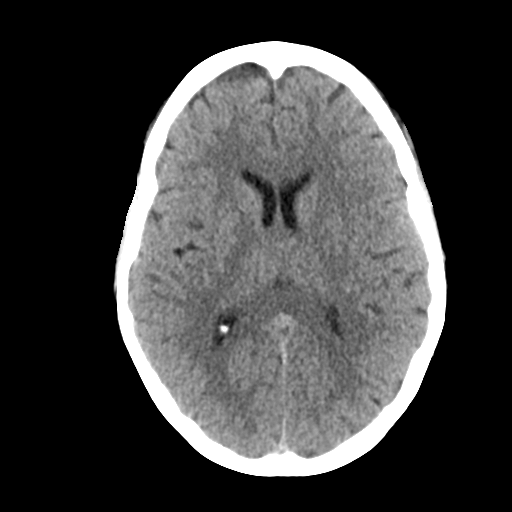
[im 16/31  bone]
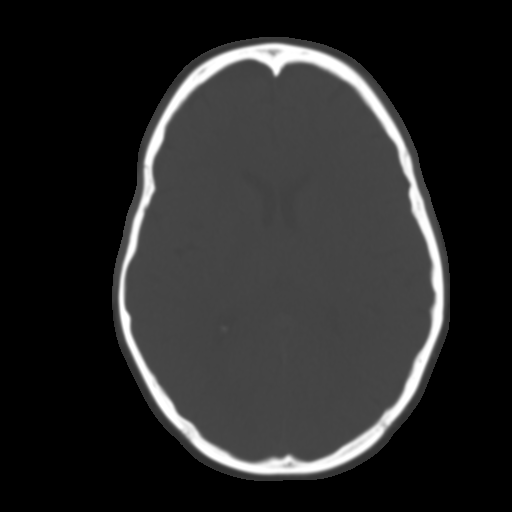
[im 19/31  bone]
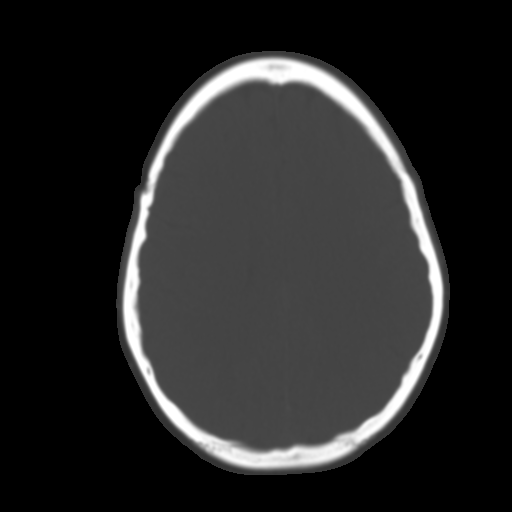
[im 22/31  bone]
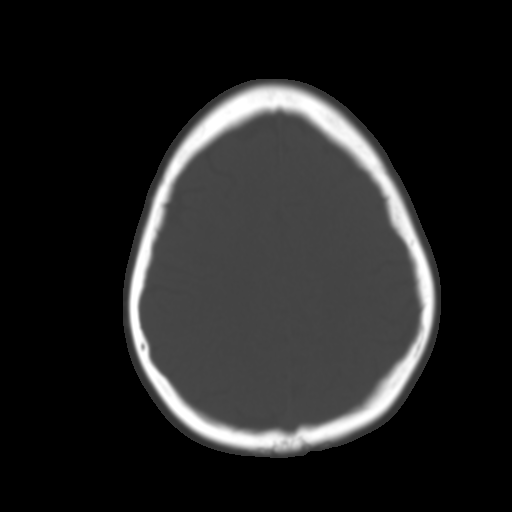
[im 25/31  bone]
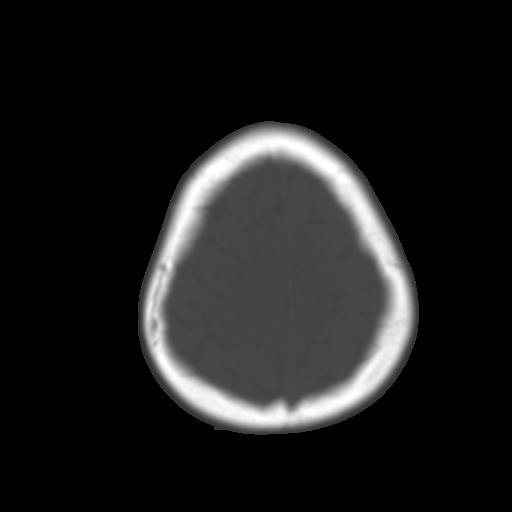
[im 28/31  brain]
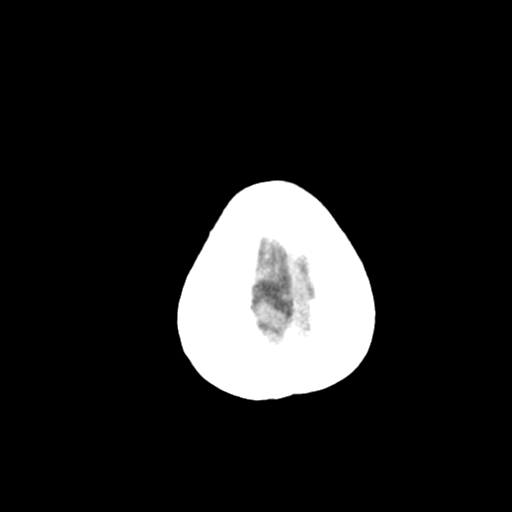
[im 28/31  bone]
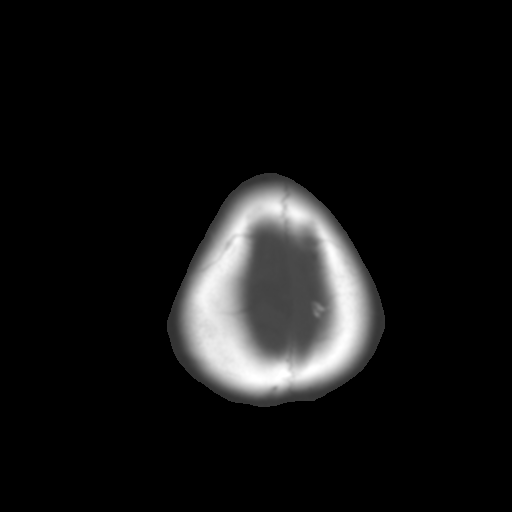

[Series 4: coronal soft tissue · coronal · 0.31mm/px · 3 of 61 slices shown]
[im 21/61  bone]
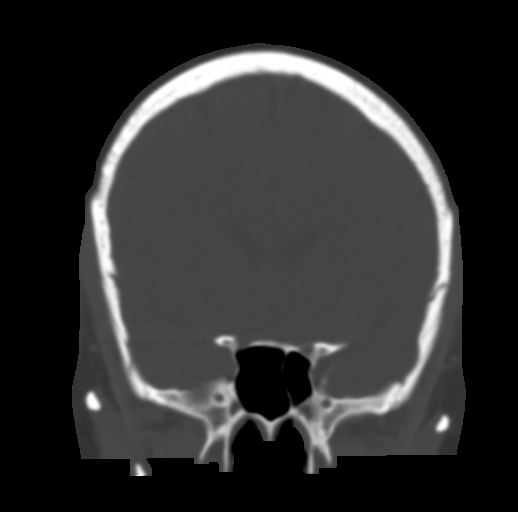
[im 27/61  bone]
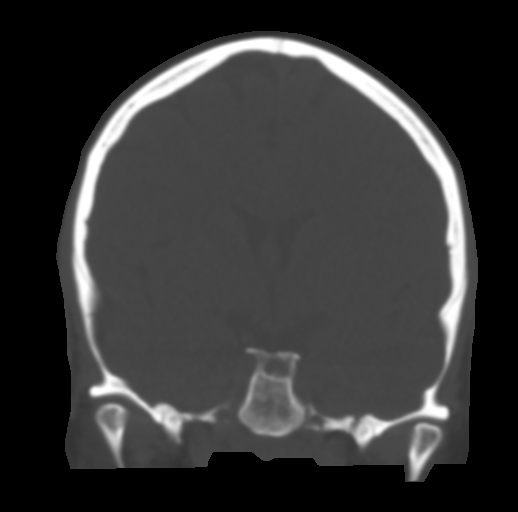
[im 34/61  bone]
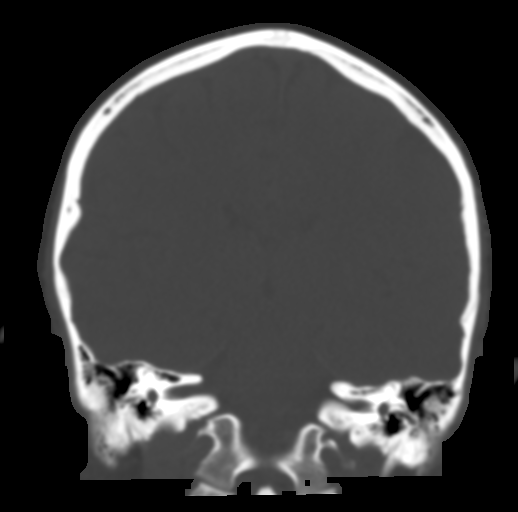

[Series 5: sagittal soft tissue · sagittal · 0.31mm/px · 3 of 49 slices shown]
[im 17/49  bone]
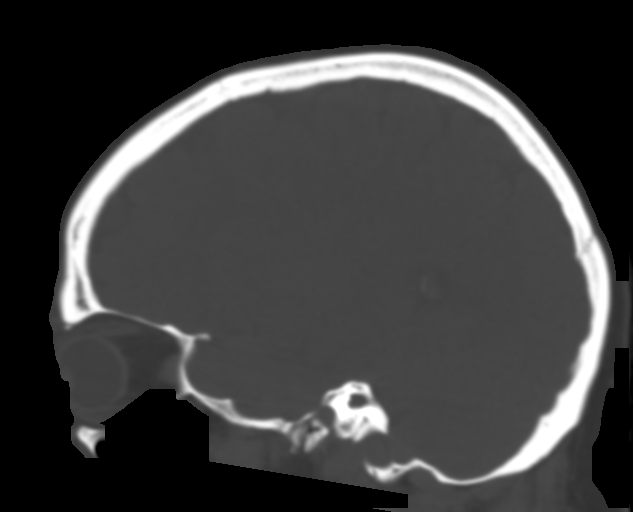
[im 25/49  bone]
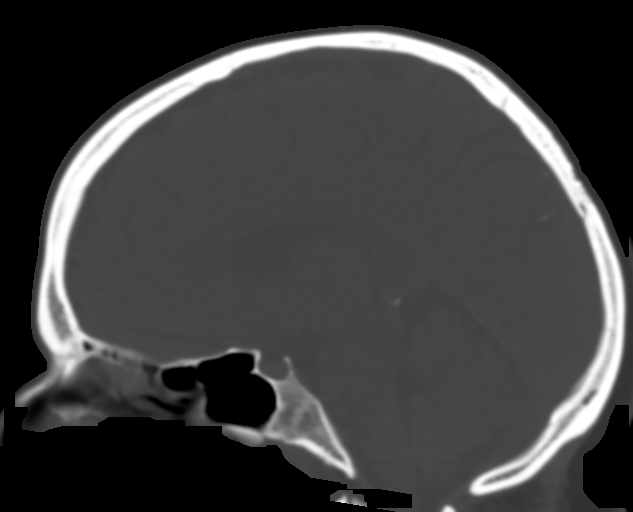
[im 33/49  bone]
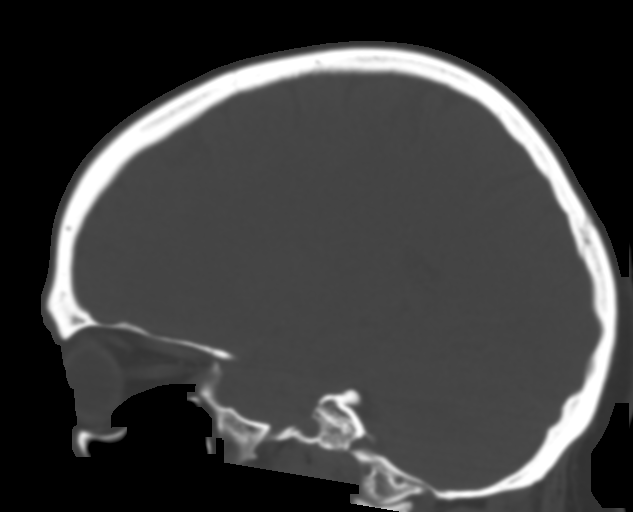

[15 of 47 positions shown; findings below may reference images not displayed]

FINDINGS: CT HEAD FINDINGS

Brain: No mass lesion, intraparenchymal hemorrhage or extra-axial
collection. No evidence of acute cortical infarct. Brain parenchyma
and CSF-containing spaces are normal for age.

Vascular: No hyperdense vessel or atherosclerotic calcification.

CT MAXILLOFACIAL FINDINGS

Osseous:

--Complex facial fracture types: No LeFort, zygomaticomaxillary
complex or nasoorbitoethmoidal fracture.

--Simple fracture types: None.

--Mandible: No fracture or dislocation.

Orbits: The globes appear intact. Normal appearance of the intra-
and extraconal fat. Symmetric extraocular muscles.

Sinuses: No fluid levels or advanced mucosal thickening.

Soft tissues: There is a right supraorbital small
hematoma/laceration.

CT CERVICAL SPINE FINDINGS

Alignment: No static subluxation. Facets are aligned. Occipital
condyles are normally positioned.

Skull base and vertebrae: No acute fracture.

Soft tissues and spinal canal: No prevertebral fluid or swelling. No
visible canal hematoma.

Disc levels: No advanced spinal canal or neural foraminal stenosis.

Upper chest: No pneumothorax, pulmonary nodule or pleural effusion.

Other: Normal visualized paraspinal cervical soft tissues.
IMPRESSION: 1. No acute intracranial abnormality.
2. No acute fracture or static subluxation of the cervical spine.
3. Small right periorbital laceration and hematoma without
underlying osseous or orbital injury.

## 2016-06-28 IMAGING — CT CT MAXILLOFACIAL W/O CM
3 series · 14 of 47 positions shown, 16 images · non-contrast
Comparison: Head CT [DATE]

CLINICAL DATA: Head injury

EXAM:
CT HEAD WITHOUT CONTRAST
CT MAXILLOFACIAL WITHOUT CONTRAST
CT CERVICAL SPINE WITHOUT CONTRAST
TECHNIQUE: Multidetector CT imaging of the head, cervical spine, and
maxillofacial structures were performed using the standard protocol
without intravenous contrast. Multiplanar CT image reconstructions
of the cervical spine and maxillofacial structures were also
generated.

[Series 2: max soft · axial · 0.33mm/px · z∈[-148,-20]mm · 8 of 76 slices shown, 10 images]
[im 6/76  brain]
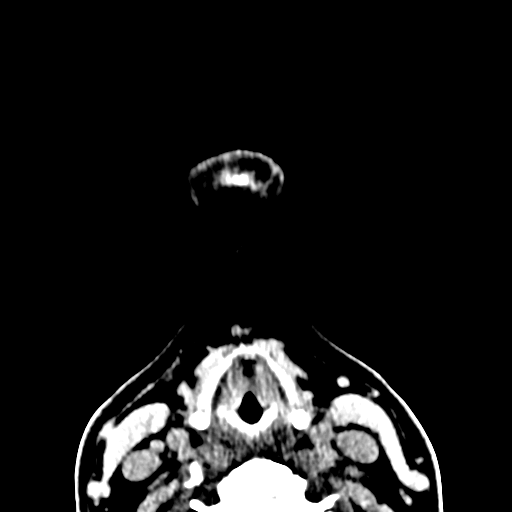
[im 6/76  bone]
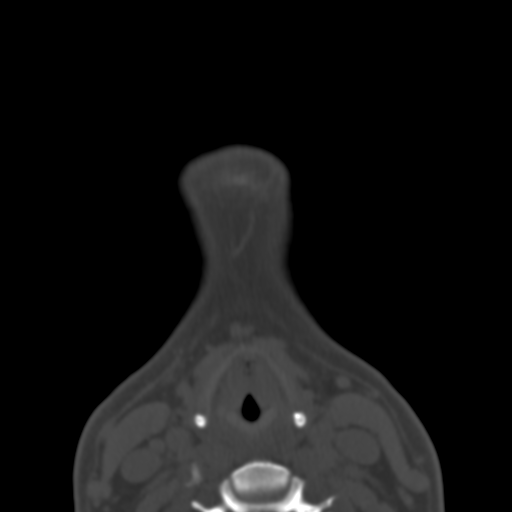
[im 16/76  bone]
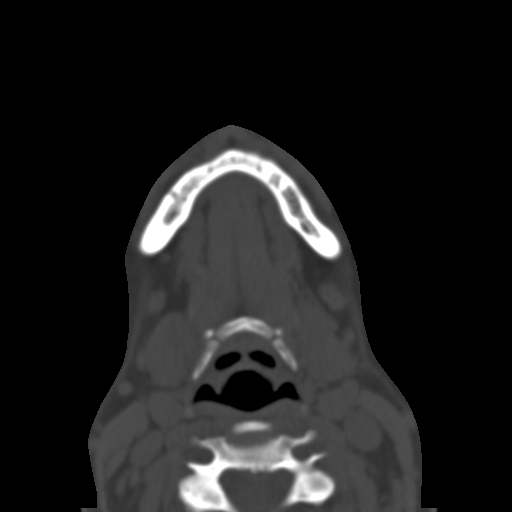
[im 24/76  bone]
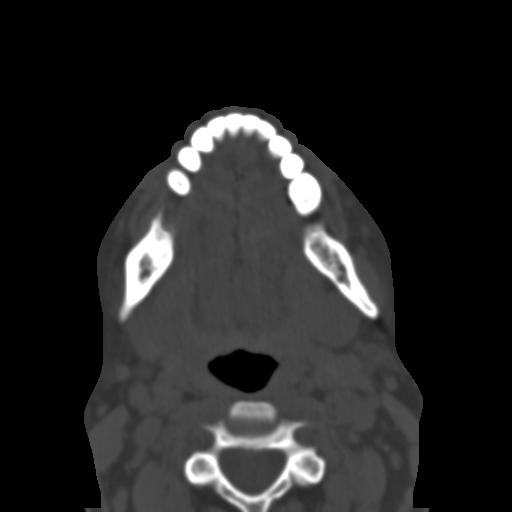
[im 34/76  bone]
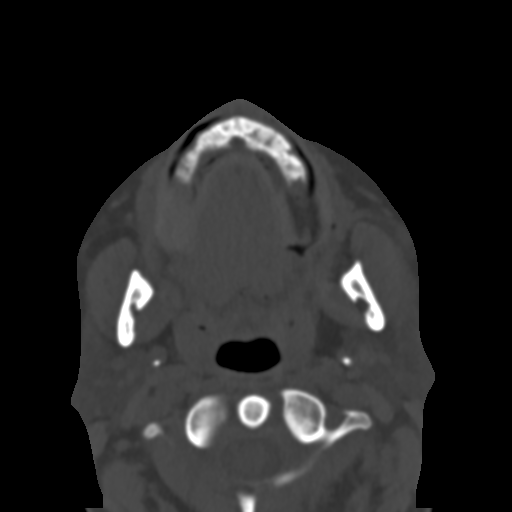
[im 42/76  brain]
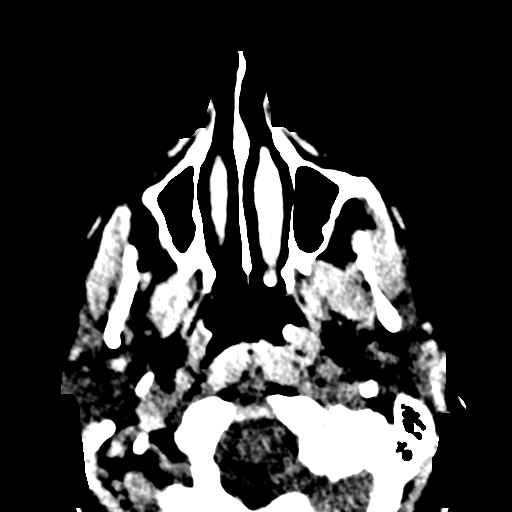
[im 42/76  bone]
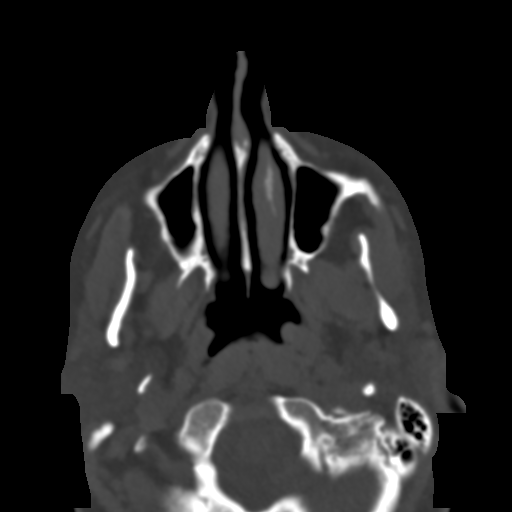
[im 52/76  bone]
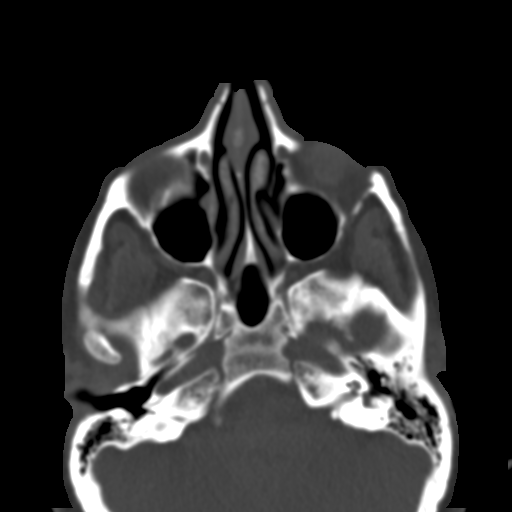
[im 60/76  bone]
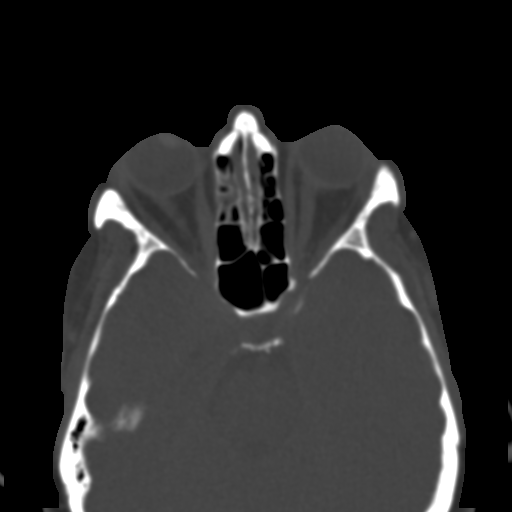
[im 70/76  bone]
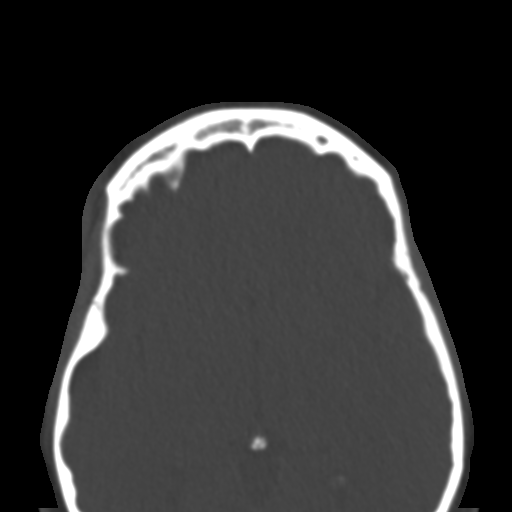

[Series 6: coronal soft · coronal · 0.31mm/px · 3 of 57 slices shown]
[im 25/57  bone]
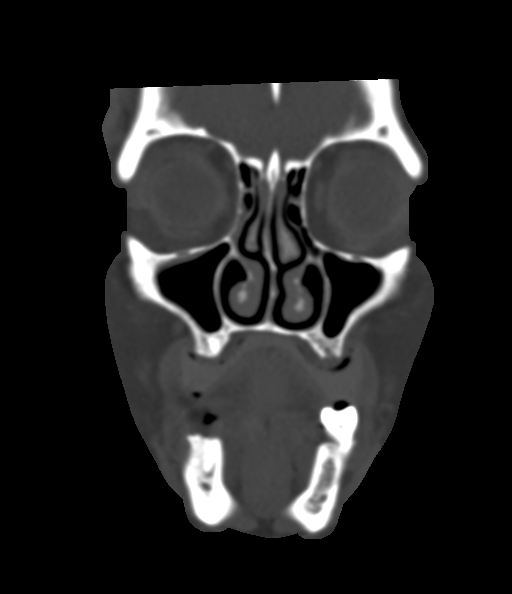
[im 32/57  bone]
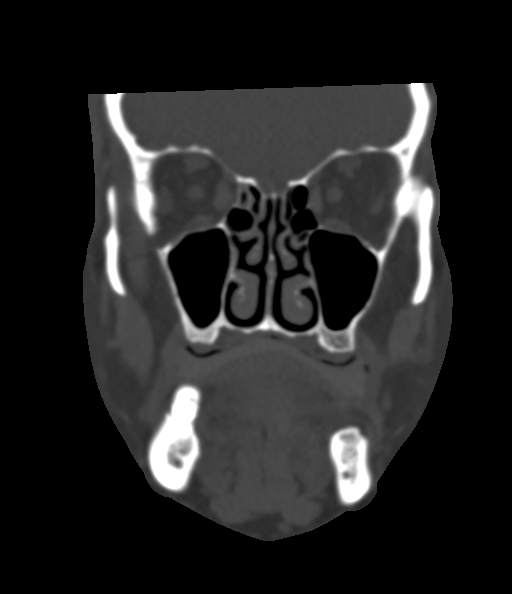
[im 38/57  bone]
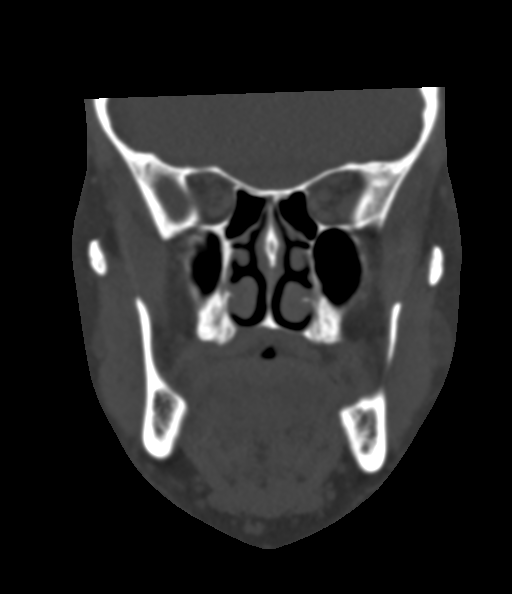

[Series 7: sagittal soft · sagittal · 0.29mm/px · 3 of 73 slices shown]
[im 25/73  bone]
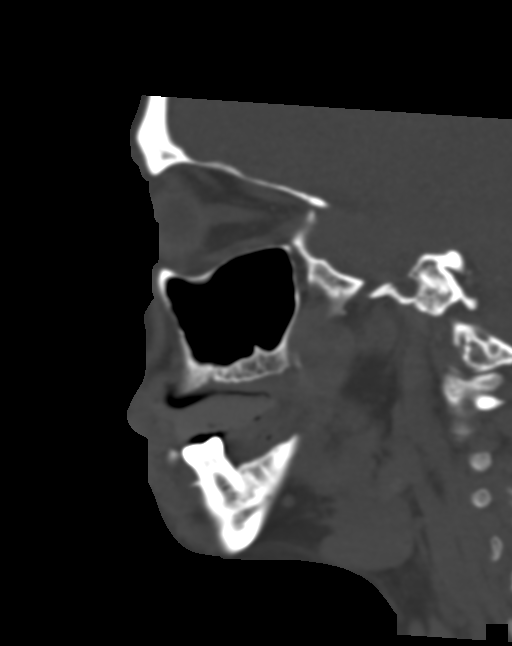
[im 37/73  bone]
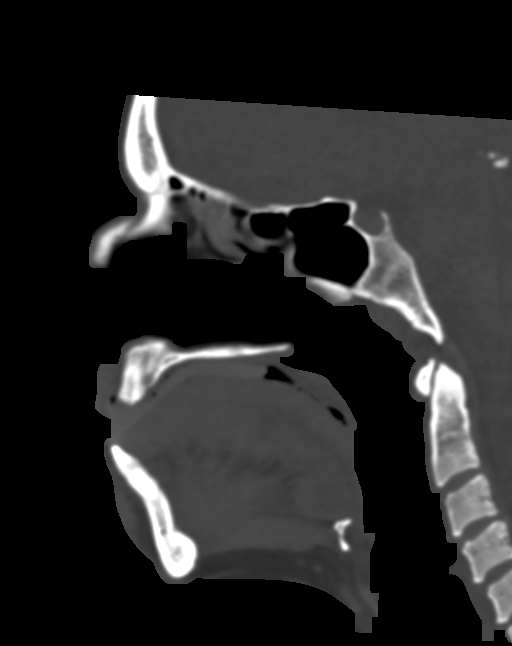
[im 49/73  bone]
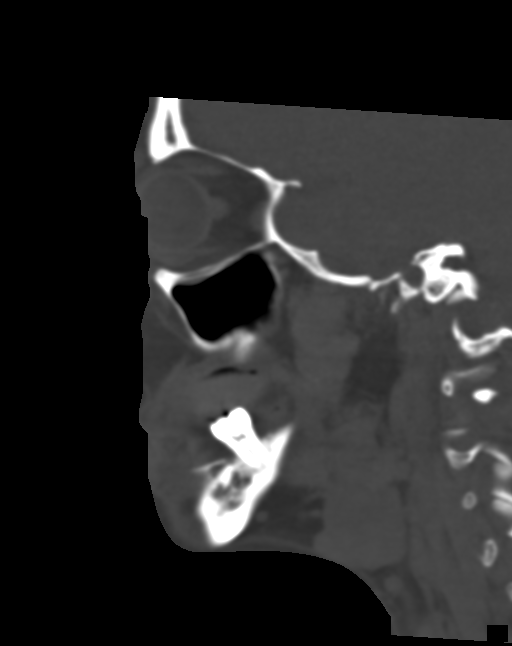

[14 of 47 positions shown; findings below may reference images not displayed]

FINDINGS: CT HEAD FINDINGS

Brain: No mass lesion, intraparenchymal hemorrhage or extra-axial
collection. No evidence of acute cortical infarct. Brain parenchyma
and CSF-containing spaces are normal for age.

Vascular: No hyperdense vessel or atherosclerotic calcification.

CT MAXILLOFACIAL FINDINGS

Osseous:

--Complex facial fracture types: No LeFort, zygomaticomaxillary
complex or nasoorbitoethmoidal fracture.

--Simple fracture types: None.

--Mandible: No fracture or dislocation.

Orbits: The globes appear intact. Normal appearance of the intra-
and extraconal fat. Symmetric extraocular muscles.

Sinuses: No fluid levels or advanced mucosal thickening.

Soft tissues: There is a right supraorbital small
hematoma/laceration.

CT CERVICAL SPINE FINDINGS

Alignment: No static subluxation. Facets are aligned. Occipital
condyles are normally positioned.

Skull base and vertebrae: No acute fracture.

Soft tissues and spinal canal: No prevertebral fluid or swelling. No
visible canal hematoma.

Disc levels: No advanced spinal canal or neural foraminal stenosis.

Upper chest: No pneumothorax, pulmonary nodule or pleural effusion.

Other: Normal visualized paraspinal cervical soft tissues.
IMPRESSION: 1. No acute intracranial abnormality.
2. No acute fracture or static subluxation of the cervical spine.
3. Small right periorbital laceration and hematoma without
underlying osseous or orbital injury.

## 2016-06-28 IMAGING — CT CT MAXILLOFACIAL W/O CM
4 series · 8 of 16 positions shown, 9 images · non-contrast
Comparison: Head CT [DATE]

CLINICAL DATA: Head injury

EXAM:
CT HEAD WITHOUT CONTRAST
CT MAXILLOFACIAL WITHOUT CONTRAST
CT CERVICAL SPINE WITHOUT CONTRAST
TECHNIQUE: Multidetector CT imaging of the head, cervical spine, and
maxillofacial structures were performed using the standard protocol
without intravenous contrast. Multiplanar CT image reconstructions
of the cervical spine and maxillofacial structures were also
generated.

[Series 3: c spine soft · axial · 0.29mm/px · z∈[-186,-98]mm · 3 of 88 slices shown]
[im 22/88  soft-tissue]
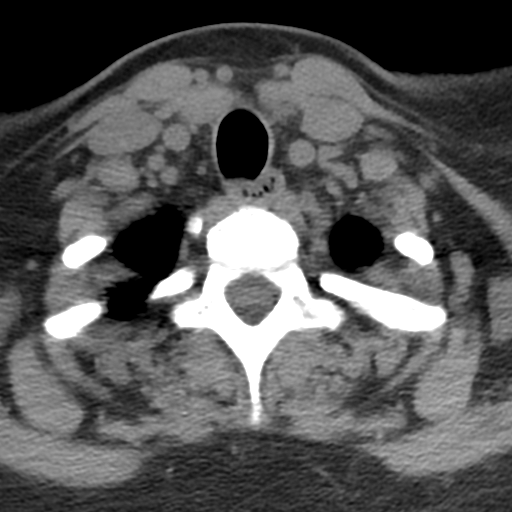
[im 44/88  soft-tissue]
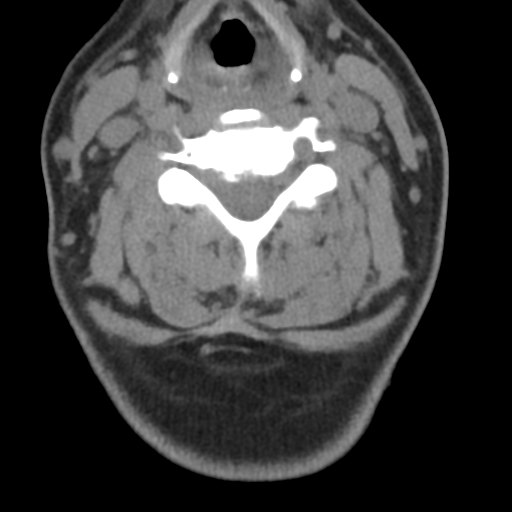
[im 66/88  soft-tissue]
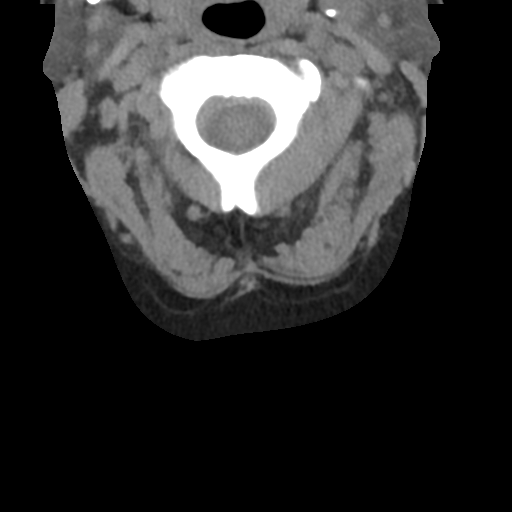

[Series 4: sagittal bone · sagittal · 0.26mm/px · 1 of 44 slices shown]
[im 22/44  bone]
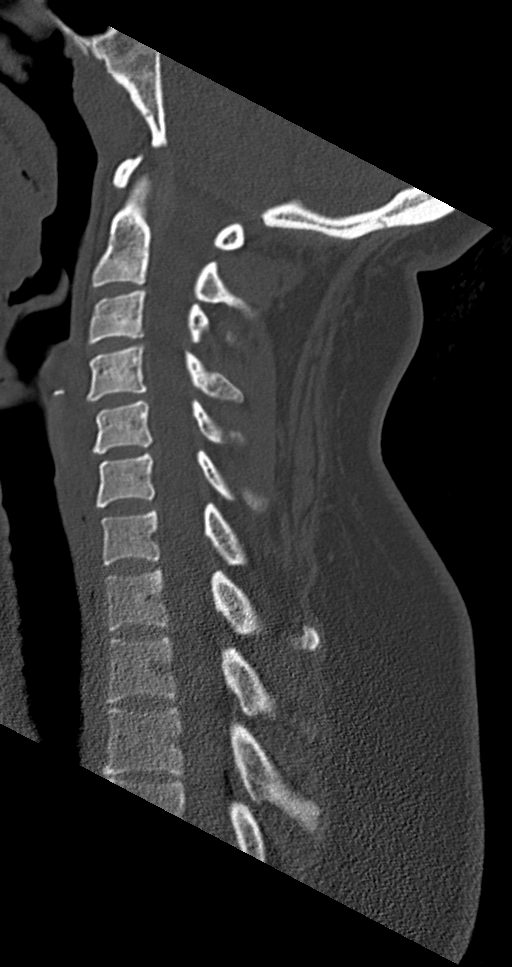

[Series 5: coronal bone · coronal · 0.27mm/px · 1 of 42 slices shown]
[im 21/42  bone]
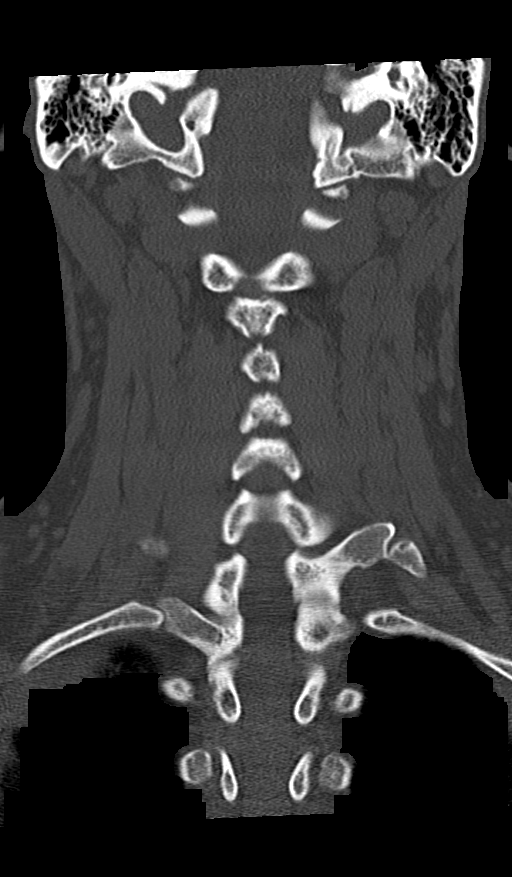

[Series 6: orthogonal axials · axial · 0.23mm/px · z∈[-206,-130]mm · 3 of 90 slices shown, 4 images]
[im 23/90  soft-tissue]
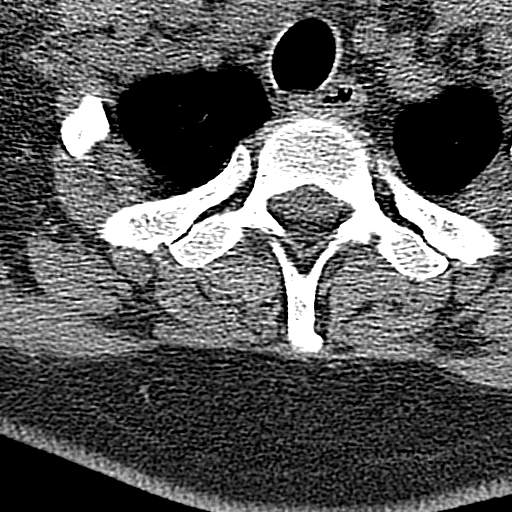
[im 23/90  bone]
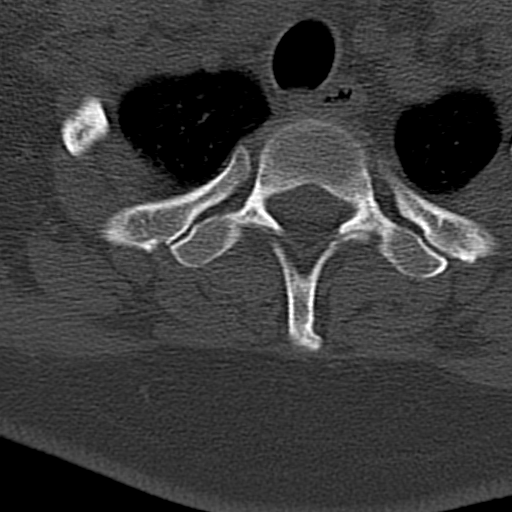
[im 45/90  bone]
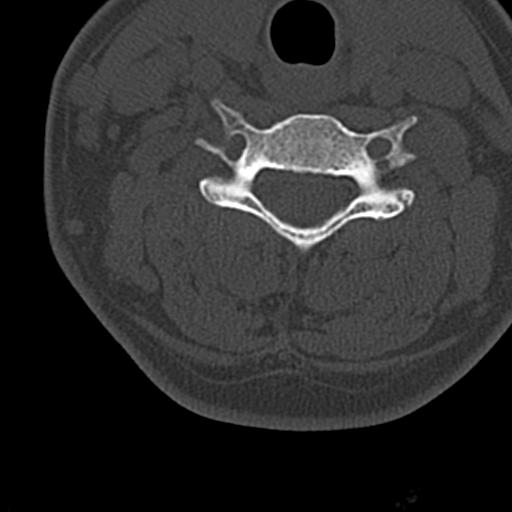
[im 67/90  bone]
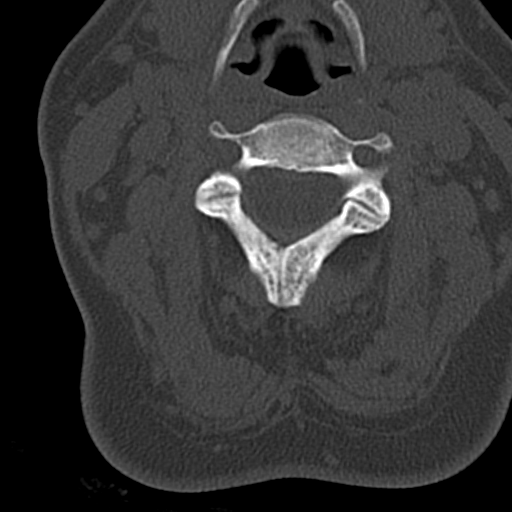

[8 of 16 positions shown; findings below may reference images not displayed]

FINDINGS: CT HEAD FINDINGS

Brain: No mass lesion, intraparenchymal hemorrhage or extra-axial
collection. No evidence of acute cortical infarct. Brain parenchyma
and CSF-containing spaces are normal for age.

Vascular: No hyperdense vessel or atherosclerotic calcification.

CT MAXILLOFACIAL FINDINGS

Osseous:

--Complex facial fracture types: No LeFort, zygomaticomaxillary
complex or nasoorbitoethmoidal fracture.

--Simple fracture types: None.

--Mandible: No fracture or dislocation.

Orbits: The globes appear intact. Normal appearance of the intra-
and extraconal fat. Symmetric extraocular muscles.

Sinuses: No fluid levels or advanced mucosal thickening.

Soft tissues: There is a right supraorbital small
hematoma/laceration.

CT CERVICAL SPINE FINDINGS

Alignment: No static subluxation. Facets are aligned. Occipital
condyles are normally positioned.

Skull base and vertebrae: No acute fracture.

Soft tissues and spinal canal: No prevertebral fluid or swelling. No
visible canal hematoma.

Disc levels: No advanced spinal canal or neural foraminal stenosis.

Upper chest: No pneumothorax, pulmonary nodule or pleural effusion.

Other: Normal visualized paraspinal cervical soft tissues.
IMPRESSION: 1. No acute intracranial abnormality.
2. No acute fracture or static subluxation of the cervical spine.
3. Small right periorbital laceration and hematoma without
underlying osseous or orbital injury.

## 2016-06-28 MED ORDER — TETANUS-DIPHTH-ACELL PERTUSSIS 5-2.5-18.5 LF-MCG/0.5 IM SUSP
0.5000 mL | Freq: Once | INTRAMUSCULAR | Status: AC
  Administered 2016-06-28: 0.5 mL via INTRAMUSCULAR
  Filled 2016-06-28: qty 0.5

## 2016-06-28 MED ORDER — AMOXICILLIN-POT CLAVULANATE 875-125 MG PO TABS: 1.0000 | ORAL_TABLET | Freq: Two times a day (BID) | ORAL | 0 refills | Status: AC

## 2016-06-28 MED ORDER — ACETAMINOPHEN 325 MG PO TABS
ORAL_TABLET | ORAL | Status: AC
  Filled 2016-06-28: qty 2

## 2016-06-28 MED ORDER — ACETAMINOPHEN 325 MG PO TABS
650.0000 mg | ORAL_TABLET | Freq: Once | ORAL | Status: AC
  Administered 2016-06-28: 650 mg via ORAL

## 2016-06-28 MED ORDER — AMOXICILLIN-POT CLAVULANATE 875-125 MG PO TABS
1.0000 | ORAL_TABLET | Freq: Once | ORAL | Status: AC
  Administered 2016-06-28: 1 via ORAL
  Filled 2016-06-28: qty 1
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
## 2016-06-28 NOTE — Discharge Instructions (Signed)
Take the Augmentin 1 pill twice a day with food. That should help any infection may develop in the right arm bite. Use Tylenol as needed for fever. Please return for any increased pain swelling redness or any other signs of infection in any of her injuries.

## 2018-04-30 ENCOUNTER — Inpatient Hospital Stay
Admission: EM | Admit: 2018-04-30 | Discharge: 2018-05-02 | DRG: 871 | Disposition: A | Discharge status: Home / Self Care | Payer: Self-pay | Attending: Internal Medicine | Admitting: Internal Medicine

## 2018-04-30 ENCOUNTER — Other Ambulatory Visit: Payer: Self-pay

## 2018-04-30 ENCOUNTER — Emergency Department: Payer: Self-pay

## 2018-04-30 DIAGNOSIS — A419 Sepsis, unspecified organism: Principal | ICD-10-CM | POA: Diagnosis present

## 2018-04-30 DIAGNOSIS — Z79899 Other long term (current) drug therapy: Secondary | ICD-10-CM

## 2018-04-30 DIAGNOSIS — J189 Pneumonia, unspecified organism: Secondary | ICD-10-CM | POA: Diagnosis present

## 2018-04-30 DIAGNOSIS — Z716 Tobacco abuse counseling: Secondary | ICD-10-CM

## 2018-04-30 DIAGNOSIS — Z20828 Contact with and (suspected) exposure to other viral communicable diseases: Secondary | ICD-10-CM

## 2018-04-30 DIAGNOSIS — J9601 Acute respiratory failure with hypoxia: Secondary | ICD-10-CM

## 2018-04-30 DIAGNOSIS — Z03818 Encounter for observation for suspected exposure to other biological agents ruled out: Secondary | ICD-10-CM

## 2018-04-30 DIAGNOSIS — F1910 Other psychoactive substance abuse, uncomplicated: Secondary | ICD-10-CM | POA: Diagnosis present

## 2018-04-30 DIAGNOSIS — J45901 Unspecified asthma with (acute) exacerbation: Secondary | ICD-10-CM | POA: Diagnosis present

## 2018-04-30 DIAGNOSIS — Z888 Allergy status to other drugs, medicaments and biological substances status: Secondary | ICD-10-CM

## 2018-04-30 DIAGNOSIS — K219 Gastro-esophageal reflux disease without esophagitis: Secondary | ICD-10-CM | POA: Diagnosis present

## 2018-04-30 DIAGNOSIS — F17210 Nicotine dependence, cigarettes, uncomplicated: Secondary | ICD-10-CM | POA: Diagnosis present

## 2018-04-30 DIAGNOSIS — Z23 Encounter for immunization: Secondary | ICD-10-CM

## 2018-04-30 DIAGNOSIS — J22 Unspecified acute lower respiratory infection: Secondary | ICD-10-CM

## 2018-04-30 LAB — CBC WITH DIFFERENTIAL/PLATELET
Abs Immature Granulocytes: 0.16 10*3/uL — ABNORMAL HIGH (ref 0.00–0.07)
Basophils Absolute: 0.1 10*3/uL (ref 0.0–0.1)
Basophils Relative: 0 %
Eosinophils Absolute: 0.2 10*3/uL (ref 0.0–0.5)
Eosinophils Relative: 1 %
HCT: 37.2 % (ref 36.0–46.0)
Hemoglobin: 11.2 g/dL — ABNORMAL LOW (ref 12.0–15.0)
Immature Granulocytes: 1 %
Lymphocytes Relative: 8 %
Lymphs Abs: 1.5 10*3/uL (ref 0.7–4.0)
MCH: 20.6 pg — ABNORMAL LOW (ref 26.0–34.0)
MCHC: 30.1 g/dL (ref 30.0–36.0)
MCV: 68.3 fL — ABNORMAL LOW (ref 80.0–100.0)
Monocytes Absolute: 0.7 10*3/uL (ref 0.1–1.0)
Monocytes Relative: 4 %
Neutro Abs: 17 10*3/uL — ABNORMAL HIGH (ref 1.7–7.7)
Neutrophils Relative %: 86 %
Platelets: 309 10*3/uL (ref 150–400)
RBC: 5.45 MIL/uL — ABNORMAL HIGH (ref 3.87–5.11)
RDW: 20.2 % — ABNORMAL HIGH (ref 11.5–15.5)
WBC: 19.9 10*3/uL — ABNORMAL HIGH (ref 4.0–10.5)
nRBC: 0 % (ref 0.0–0.2)

## 2018-04-30 LAB — BASIC METABOLIC PANEL
Anion gap: 6 (ref 5–15)
BUN: 5 mg/dL — ABNORMAL LOW (ref 6–20)
CO2: 26 mmol/L (ref 22–32)
Calcium: 8.8 mg/dL — ABNORMAL LOW (ref 8.9–10.3)
Chloride: 100 mmol/L (ref 98–111)
Creatinine, Ser: 0.55 mg/dL (ref 0.44–1.00)
GFR calc Af Amer: >60 mL/min (ref >60)
GFR calc non Af Amer: >60 mL/min (ref >60)
Glucose, Bld: 111 mg/dL — ABNORMAL HIGH (ref 70–99)
Potassium: 4.2 mmol/L (ref 3.5–5.1)
Sodium: 132 mmol/L — ABNORMAL LOW (ref 135–145)

## 2018-04-30 LAB — TROPONIN I: Troponin I: <0.03 ng/mL (ref <0.03)

## 2018-04-30 LAB — PROCALCITONIN: Procalcitonin: 0.53 ng/mL

## 2018-04-30 LAB — LACTIC ACID, PLASMA: Lactic Acid, Venous: 1.5 mmol/L (ref 0.5–1.9)

## 2018-04-30 IMAGING — DX PORTABLE CHEST - 1 VIEW
1 series · 1 of 1 positions shown · non-contrast
Comparison: Chest radiograph dated [DATE]

CLINICAL DATA: 48-year-old female with cough and shortness of
breath.

EXAM:
PORTABLE CHEST 1 VIEW

[chest ap]
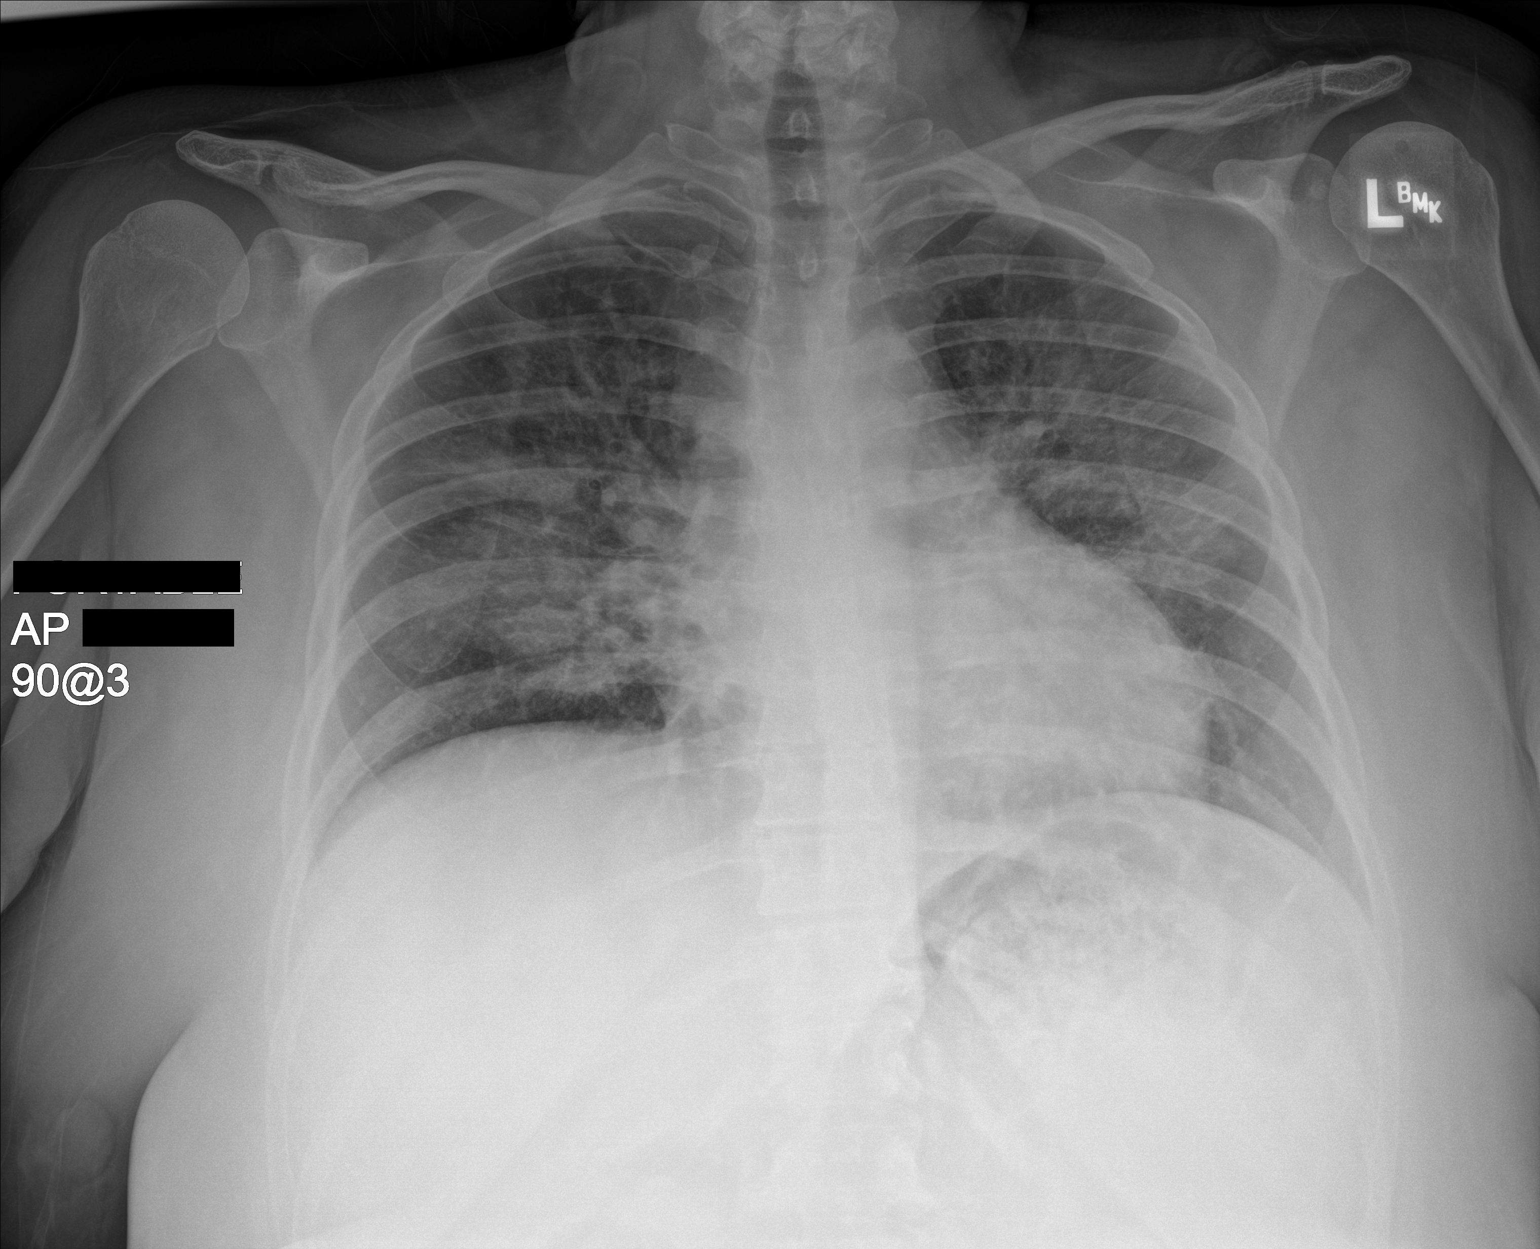

[1 of 1 positions shown; findings below may reference images not displayed]

FINDINGS: Bilateral perihilar patchy areas of hazy airspace density noted
which may represent edema versus pneumonia. Clinical correlation is
recommended. There is no pleural effusion or pneumothorax. The
cardiac silhouette is within normal limits. No acute osseous
pathology.
IMPRESSION: Findings likely represent edema although pneumonia is not excluded.
Clinical correlation is recommended.

## 2018-04-30 MED ORDER — ACETAMINOPHEN 500 MG PO TABS
1000.0000 mg | ORAL_TABLET | Freq: Once | ORAL | Status: AC
  Administered 2018-04-30: 1000 mg via ORAL
  Filled 2018-04-30: qty 2

## 2018-04-30 MED ORDER — SODIUM CHLORIDE 0.9 % IV SOLN
500.0000 mg | INTRAVENOUS | Status: DC
  Administered 2018-04-30: 500 mg via INTRAVENOUS
  Filled 2018-04-30 (×2): qty 500

## 2018-04-30 MED ORDER — SODIUM CHLORIDE 0.9 % IV BOLUS
1000.0000 mL | Freq: Once | INTRAVENOUS | Status: AC
  Administered 2018-04-30: 21:00:00 1000 mL via INTRAVENOUS

## 2018-04-30 MED ORDER — AZITHROMYCIN 250 MG PO TABS: ORAL_TABLET | ORAL | 0 refills | Status: DC

## 2018-04-30 MED ORDER — ALBUTEROL SULFATE HFA 108 (90 BASE) MCG/ACT IN AERS: 2.0000 | INHALATION_SPRAY | RESPIRATORY_TRACT | 1 refills | Status: DC | PRN

## 2018-04-30 MED ORDER — SODIUM CHLORIDE 0.9 % IV SOLN
2.0000 g | INTRAVENOUS | Status: DC
  Administered 2018-04-30 – 2018-05-01 (×3): 2 g via INTRAVENOUS
  Filled 2018-04-30 (×2): qty 20
  Filled 2018-04-30: qty 2
  Filled 2018-04-30: qty 20

## 2018-04-30 MED ORDER — PREDNISONE 20 MG PO TABS
60.0000 mg | ORAL_TABLET | Freq: Once | ORAL | Status: AC
  Administered 2018-04-30: 21:00:00 60 mg via ORAL
  Filled 2018-04-30: qty 3

## 2018-04-30 MED ORDER — PREDNISONE 20 MG PO TABS: 60.0000 mg | ORAL_TABLET | Freq: Every day | ORAL | 0 refills | Status: AC

## 2018-04-30 NOTE — Progress Notes (Signed)
CODE SEPSIS - PHARMACY COMMUNICATION  **Broad Spectrum Antibiotics should be administered within 1 hour of Sepsis diagnosis**  Time Code Sepsis Called/Page Received: @ 2241  Antibiotics Ordered: Ceftriaxone and Azithromycin   Time of 1st antibiotic administration: @ 2321  Additional action taken by pharmacy: Spoke with RN @ 2300 about time left to start abx for code sepsis  Gardner Candle, PharmD, BCPS Clinical Pharmacist 04/30/2018 11:37 PM

## 2018-04-30 NOTE — ED Notes (Signed)
Patient's oxygen saturation with ambulation dropped to 80% on room air

## 2018-04-30 NOTE — ED Notes (Signed)
ED TO INPATIENT HANDOFF REPORT  ED Nurse Name and Phone #: Jae Dire [phone]  S Name/Age/Gender Brenda [name] 48 y.o. female Room/Bed: ED34A/ED34A  Code Status   Code Status: Prior  Home/SNF/Other Home Patient oriented to: self, place, time and situation Is this baseline? Yes   Triage Complete: Triage complete  Chief Complaint Sob/cough/fever  Triage Note Pt states that she has had a cough for the past couple days, reports hx of asthma and allergies, states this am woke up with fever and feeling worse with some sob   Allergies Allergies  Allergen Reactions  . Flexeril [Cyclobenzaprine] Other (See Comments)    Increased muscle spasms    Level of Care/Admitting Diagnosis ED Disposition    ED Disposition Condition Comment   Admit  Hospital Area: [facility] [[id number]]  Level of Care: Med-Surg [16]  Diagnosis: Sepsis [facility]) [[id number]]  Admitting Physician: Arnaldo Natal [[id number]]  Attending Physician: Arnaldo Natal [[id number]]  Estimated length of stay: past midnight tomorrow  Certification:: I certify this patient will need inpatient services for at least 2 midnights  PT Class (Do Not Modify): Inpatient [101]  PT Acc Code (Do Not Modify): Private [1]       B Medical/Surgery History Past Medical History:  Diagnosis Date  . Depression   . Polysubstance abuse    Past Surgical History:  Procedure Laterality Date  . TUBAL LIGATION       A IV Location/Drains/Wounds Patient Lines/Drains/Airways Status   Active Line/Drains/Airways    Name:   Placement date:   Placement time:   Site:   Days:   Peripheral IV 04/30/18 Left Antecubital   04/30/18    2046    Antecubital   less than 1          Intake/Output Last 24 hours No intake or output data in the 24 hours ending 04/30/18 2232  Labs/Imaging Results for orders placed or performed during the hospital encounter of 04/30/18 (from the past 48 hour(s))  Basic metabolic panel      Status: Abnormal   Collection Time: 04/30/18  8:16 PM  Result Value Ref Range   Sodium 132 (L) 135 - 145 mmol/L   Potassium 4.2 3.5 - 5.1 mmol/L   Chloride 100 98 - 111 mmol/L   CO2 26 22 - 32 mmol/L   Glucose, Bld 111 (H) 70 - 99 mg/dL   BUN 5 (L) 6 - 20 mg/dL   Creatinine, Ser 6.75 0.44 - 1.00 mg/dL   Calcium 8.8 (L) 8.9 - 10.3 mg/dL   GFR calc non Af Amer >60 >60 mL/min   GFR calc Af Amer >60 >60 mL/min   Anion gap 6 5 - 15    Comment: Performed at [facility], [address] [place], [place] [address]  CBC with Differential     Status: Abnormal   Collection Time: 04/30/18  8:16 PM  Result Value Ref Range   WBC 19.9 (H) 4.0 - 10.5 K/uL   RBC 5.45 (H) 3.87 - 5.11 MIL/uL   Hemoglobin 11.2 (L) 12.0 - 15.0 g/dL   HCT 46.6 59.9 - 35.7 %   MCV 68.3 (L) 80.0 - 100.0 fL   MCH 20.6 (L) 26.0 - 34.0 pg   MCHC 30.1 30.0 - 36.0 g/dL   RDW 01.7 (H) 79.3 - 90.3 %   Platelets 309 150 - 400 K/uL   nRBC 0.0 0.0 - 0.2 %   Neutrophils Relative % 86 %   Neutro Abs  17.0 (H) 1.7 - 7.7 K/uL   Lymphocytes Relative 8 %   Lymphs Abs 1.5 0.7 - 4.0 K/uL   Monocytes Relative 4 %   Monocytes Absolute 0.7 0.1 - 1.0 K/uL   Eosinophils Relative 1 %   Eosinophils Absolute 0.2 0.0 - 0.5 K/uL   Basophils Relative 0 %   Basophils Absolute 0.1 0.0 - 0.1 K/uL   WBC Morphology MORPHOLOGY UNREMARKABLE    RBC Morphology MORPHOLOGY UNREMARKABLE    Smear Review MORPHOLOGY UNREMARKABLE    Immature Granulocytes 1 %   Abs Immature Granulocytes 0.16 (H) 0.00 - 0.07 K/uL    Comment: Performed at [facility], [address] [place], [place] [address]  Troponin I - Once     Status: None   Collection Time: 04/30/18  8:47 PM  Result Value Ref Range   Troponin I <0.03 <0.03 ng/mL    Comment: Performed at [facility], [address] [place], [place] [address]  Lactic acid, plasma     Status: None   Collection Time: 04/30/18  8:47 PM  Result Value Ref Range   Lactic Acid, Venous 1.5  0.5 - 1.9 mmol/L    Comment: Performed at [facility], [address] [place], [place] [address]   Dg Chest Portable 1 View  Result Date: 04/30/2018 CLINICAL DATA:  48 year old female with cough and shortness of breath. EXAM: PORTABLE CHEST 1 VIEW COMPARISON:  Chest radiograph dated 05/01/2016 FINDINGS: Bilateral perihilar patchy areas of hazy airspace density noted which may represent edema versus pneumonia. Clinical correlation is recommended. There is no pleural effusion or pneumothorax. The cardiac silhouette is within normal limits. No acute osseous pathology. IMPRESSION: Findings likely represent edema although pneumonia is not excluded. Clinical correlation is recommended. Electronically Signed   By: Elgie Collard M.D.   On: 04/30/2018 20:09    Pending Labs Unresulted Labs (From admission, onward)    Start     Ordered   Signed and Held  Creatinine, serum  (enoxaparin (LOVENOX)    CrCl >/= 30 ml/min)  Weekly,   R    Comments:  while on enoxaparin therapy    Signed and Held   Signed and Held  TSH  Add-on,   R     Signed and Held          Vitals/Pain Today's Vitals   04/30/18 2100 04/30/18 2118 04/30/18 2144 04/30/18 2212  BP: 120/72  119/81   Pulse: (!) 106  (!) 110 98  Resp:   18 (!) 26  Temp: 100 F (37.8 C)  99.8 F (37.7 C)   TempSrc:      SpO2: 92%  91% 94%  Weight:      Height:      PainSc: 6  6  Asleep     Isolation Precautions No active isolations  Medications Medications  predniSONE (DELTASONE) tablet 60 mg (60 mg Oral Given 04/30/18 2035)  sodium chloride 0.9 % bolus 1,000 mL (0 mLs Intravenous Stopped 04/30/18 2204)  acetaminophen (TYLENOL) tablet 1,000 mg (1,000 mg Oral Given 04/30/18 2035)    Mobility Walks with assistance  High fall risk   Focused Assessments Pulmonary Assessment Handoff:  Lung sounds: Bilateral Breath Sounds: Diminished L Breath Sounds: Diminished R Breath Sounds: Diminished O2 Device: Nasal Cannula O2 Flow Rate  (L/min): 2 L/min      R Recommendations: See Admitting Provider Note  Report given to:   Additional Notes:

## 2018-04-30 NOTE — ED Triage Notes (Signed)
Pt states that she has had a cough for the past couple days, reports hx of asthma and allergies, states this am woke up with fever and feeling worse with some sob

## 2018-04-30 NOTE — ED Provider Notes (Addendum)
-----------------------------------------   10:12 PM on 04/30/2018 -----------------------------------------   CBC still pending.  Other labs are unremarkable.  Chest x-ray with perihilar infiltrates.  On my reassessment, after receiving her saline bolus, she is a 88% oxygen saturation at rest with a heart rate of 115.  When I have her stand next to the bed, oxygen saturation drops to 83% on room air and heart rate increases to 130.  She has a pronounced tachypnea as well.  Denies chest pain, no risk factors for PE.  Given the current state pandemic state of emergency, suspected coronavirus.  Will start nasal cannula.  Discussed with hospitalist for admission.  Given fever tachycardia tachypnea hypoxia, patient meets sepsis criteria for likely atypical pneumonia.  It certainly possible that this is COVID-19, so order a COVID-19 swab in addition to code sepsis with blood cultures ceftriaxone and azithromycin.   Sharman Cheek, MD 04/30/18 2214    Sharman Cheek, MD 04/30/18 2245

## 2018-04-30 NOTE — ED Provider Notes (Signed)
[facility] Emergency Department Provider Note ____________________________________________   First MD Initiated Contact with Patient 04/30/18 1901     (approximate)  I have reviewed the triage vital signs and the nursing notes.   HISTORY  Chief Complaint Fever; Cough; and Shortness of Breath    HPI Brenda [name] is a 48 y.o. female with history of asthma and other PMH as noted below who presents with shortness of breath, acute onset today, similar to prior asthma exacerbations, and associated with nonproductive cough over the last several days.  In addition the patient reports a low-grade fever.  She has some fatigue, but denies chest pain, vomiting or diarrhea.  She has no history of travel outside of the state or exposure to anyone at high risk for COVID-19.   Past Medical History:  Diagnosis Date  . Depression   . Polysubstance abuse     Patient Active Problem List   Diagnosis Date Noted  . Opioid use disorder, moderate, dependence (HCC) 05/02/2016  . Tobacco use disorder 05/02/2016  . Severe recurrent major depression without psychotic features (HCC) 05/01/2016  . Cocaine use disorder, moderate, dependence (HCC) 05/01/2016  . Alcohol abuse 05/01/2016  . GERD (gastroesophageal reflux disease) 05/01/2016    Past Surgical History:  Procedure Laterality Date  . TUBAL LIGATION      Prior to Admission medications   Medication Sig Start Date End Date Taking? Authorizing Provider  albuterol (PROVENTIL HFA;VENTOLIN HFA) 108 (90 Base) MCG/ACT inhaler Inhale 2 puffs into the lungs every 4 (four) hours as needed for wheezing or shortness of breath. 04/30/18   Dionne Bucy, MD  azithromycin (ZITHROMAX Z-PAK) 250 MG tablet Take 2 tablets (500 mg) on  Day 1,  followed by 1 tablet (250 mg) once daily on Days 2 through 5. 04/30/18 05/05/18  Dionne Bucy, MD  hydrOXYzine (ATARAX/VISTARIL) 50 MG tablet Take 1 tablet (50 mg total) by mouth 3  (three) times daily as needed for anxiety. 05/04/16   Clapacs, Jackquline Denmark, MD  pantoprazole (PROTONIX) 40 MG tablet Take 1 tablet (40 mg total) by mouth daily. 05/05/16   Clapacs, Jackquline Denmark, MD  predniSONE (DELTASONE) 20 MG tablet Take 3 tablets (60 mg total) by mouth daily with breakfast for 4 days. Start the day after the ED visit 05/01/18 05/05/18  Dionne Bucy, MD  QUEtiapine (SEROQUEL) 100 MG tablet Take 1 tablet (100 mg total) by mouth at bedtime. 05/04/16   Clapacs, Jackquline Denmark, MD  QUEtiapine (SEROQUEL) 25 MG tablet Take 1 tablet (25 mg total) by mouth 3 (three) times daily. 05/04/16   Clapacs, Jackquline Denmark, MD  rOPINIRole (REQUIP) 1 MG tablet Take 1 tablet (1 mg total) by mouth at bedtime. 05/04/16   Clapacs, Jackquline Denmark, MD    Allergies Flexeril [cyclobenzaprine]  No family history on file.  Social History Social History   Tobacco Use  . Smoking status: Current Every Day Smoker    Packs/day: 1.00    Years: 30.00    Pack years: 30.00    Types: Cigarettes  . Smokeless tobacco: Never Used  Substance Use Topics  . Alcohol use: No  . Drug use: Yes    Frequency: 7.0 times per week    Types: Cocaine    Comment: Pt also states sometimes Heroin    Review of Systems  Constitutional: Positive for fever. Eyes: No redness. ENT: Positive for nasal congestion. Cardiovascular: Denies chest pain. Respiratory: Positive for shortness of breath. Gastrointestinal: No vomiting or diarrhea.  Genitourinary:  Negative for flank pain.  Musculoskeletal: Negative for back pain. Skin: Negative for rash. Neurological: Negative for headache.   ____________________________________________   PHYSICAL EXAM:  VITAL SIGNS: ED Triage Vitals  Enc Vitals Group     BP 04/30/18 1850 139/73     Pulse Rate 04/30/18 1850 (!) 125     Resp 04/30/18 1850 (!) 24     Temp 04/30/18 1850 100.1 F (37.8 C)     Temp Source 04/30/18 1850 Oral     SpO2 04/30/18 1850 92 %     Weight 04/30/18 1851 185 lb (83.9 kg)     Height  04/30/18 1851 5\' 2"  (1.575 m)     Head Circumference --      Peak Flow --      Pain Score 04/30/18 1851 10     Pain Loc --      Pain Edu? --      Excl. in GC? --     Constitutional: Alert and oriented.  Relatively well appearing and in no acute distress. Eyes: Conjunctivae are normal.  Head: Atraumatic. Nose: No congestion/rhinnorhea. Mouth/Throat: Mucous membranes are slightly dry.   Neck: Normal range of motion.  Cardiovascular: Tachycardic, regular rhythm. Grossly normal heart sounds.  Good peripheral circulation. Respiratory: Slightly increased respiratory effort.  No retractions. Lungs CTAB. Gastrointestinal: No distention.  Musculoskeletal: Extremities warm and well perfused.  Neurologic:  Normal speech and language. No gross focal neurologic deficits are appreciated.  Skin:  Skin is warm and dry. No rash noted. Psychiatric: Mood and affect are normal. Speech and behavior are normal.  ____________________________________________   LABS (all labs ordered are listed, but only abnormal results are displayed)  Labs Reviewed  BASIC METABOLIC PANEL  CBC WITH DIFFERENTIAL/PLATELET  TROPONIN I  LACTIC ACID, PLASMA  LACTIC ACID, PLASMA   ____________________________________________  EKG   ____________________________________________  RADIOLOGY  CXR: Bilateral perihilar patchy opacities  ____________________________________________   PROCEDURES  Procedure(s) performed: No  Procedures  Critical Care performed: No ____________________________________________   INITIAL IMPRESSION / ASSESSMENT AND PLAN / ED COURSE  Pertinent labs & imaging results that were available during my care of the patient were reviewed by me and considered in my medical decision making (see chart for details).  48 year old female with PMH as noted above presents with shortness of breath acutely today, associated with cough for several days.  She also has a low-grade fever.  On exam  the patient is overall relatively well-appearing.  She has a low-grade temperature and tachycardia.  Her O2 saturation is in the low 90s on room air.  She has slightly increased work of breathing but no respiratory distress, and is speaking full sentences.  Overall differential includes asthma exacerbation, acute bronchitis, pneumonia, or less likely but possible symptoms related to COVID-19.  We will obtain a chest x-ray, lab work-up, give fluids, and reassess.  At this time, the patient does not meet criteria to test for COVID-19 unless she ends up needing to be admitted.  ----------------------------------------- 8:45 PM on 04/30/2018 -----------------------------------------  Chest x-ray shows some hazy perihilar opacity, consistent with possible bronchitis or viral or atypical pneumonia.  The patient is pending lab work-up at this time.  I am signing the patient out to the oncoming physician Dr. Scotty Court.  If she is feeling better after the fluids and prednisone and her labs are reassuring, she feels comfortable going home.  However if the patient has any hypoxia or concerning lab abnormalities she may require admission.  _______  Victorino Dike  C Bechtel was evaluated in Emergency Department on 04/30/2018 for the symptoms described in the history of present illness. She was evaluated in the context of the global COVID-19 pandemic, which necessitated consideration that the patient might be at risk for infection with the SARS-CoV-2 virus that causes COVID-19. Institutional protocols and algorithms that pertain to the evaluation of patients at risk for COVID-19 are in a state of rapid change based on information released by regulatory bodies including the CDC and federal and state organizations. These policies and algorithms were followed during the patient's care in the ED. ____________________________________________   FINAL CLINICAL IMPRESSION(S) / ED DIAGNOSES  Final diagnoses:  None      NEW  MEDICATIONS STARTED DURING THIS VISIT:  New Prescriptions   ALBUTEROL (PROVENTIL HFA;VENTOLIN HFA) 108 (90 BASE) MCG/ACT INHALER    Inhale 2 puffs into the lungs every 4 (four) hours as needed for wheezing or shortness of breath.   AZITHROMYCIN (ZITHROMAX Z-PAK) 250 MG TABLET    Take 2 tablets (500 mg) on  Day 1,  followed by 1 tablet (250 mg) once daily on Days 2 through 5.   PREDNISONE (DELTASONE) 20 MG TABLET    Take 3 tablets (60 mg total) by mouth daily with breakfast for 4 days. Start the day after the ED visit     Note:  This document was prepared using Dragon voice recognition software and may include unintentional dictation errors.    Dionne Bucy, MD 04/30/18 2046

## 2018-05-01 ENCOUNTER — Encounter: Payer: Self-pay | Admitting: Internal Medicine

## 2018-05-01 LAB — TSH: TSH: 1.192 u[IU]/mL (ref 0.350–4.500)

## 2018-05-01 LAB — INFLUENZA PANEL BY PCR (TYPE A & B)
Influenza A By PCR: NEGATIVE
Influenza B By PCR: NEGATIVE

## 2018-05-01 MED ORDER — AZITHROMYCIN 250 MG PO TABS
250.0000 mg | ORAL_TABLET | Freq: Every day | ORAL | Status: DC
  Administered 2018-05-01 – 2018-05-02 (×2): 250 mg via ORAL
  Filled 2018-05-01 (×2): qty 1

## 2018-05-01 MED ORDER — PREDNISONE 50 MG PO TABS
50.0000 mg | ORAL_TABLET | Freq: Every day | ORAL | Status: DC
  Administered 2018-05-01 – 2018-05-02 (×2): 50 mg via ORAL
  Filled 2018-05-01 (×2): qty 1

## 2018-05-01 MED ORDER — INFLUENZA VAC SPLIT QUAD 0.5 ML IM SUSY
0.5000 mL | PREFILLED_SYRINGE | INTRAMUSCULAR | Status: AC
  Administered 2018-05-02: 0.5 mL via INTRAMUSCULAR
  Filled 2018-05-01: qty 0.5

## 2018-05-01 MED ORDER — QUETIAPINE FUMARATE 25 MG PO TABS
100.0000 mg | ORAL_TABLET | Freq: Every day | ORAL | Status: DC
  Administered 2018-05-01 (×2): 100 mg via ORAL
  Filled 2018-05-01 (×2): qty 4

## 2018-05-01 MED ORDER — SODIUM CHLORIDE 0.9% FLUSH
10.0000 mL | Freq: Two times a day (BID) | INTRAVENOUS | Status: DC
  Administered 2018-05-02: 10 mL via INTRAVENOUS

## 2018-05-01 MED ORDER — NICOTINE 21 MG/24HR TD PT24
21.0000 mg | MEDICATED_PATCH | Freq: Every day | TRANSDERMAL | Status: DC
  Administered 2018-05-01 – 2018-05-02 (×2): 21 mg via TRANSDERMAL
  Filled 2018-05-01 (×3): qty 1

## 2018-05-01 MED ORDER — HYDROXYZINE HCL 25 MG PO TABS: 50.0000 mg | ORAL_TABLET | Freq: Three times a day (TID) | ORAL | Status: DC | PRN

## 2018-05-01 MED ORDER — ENOXAPARIN SODIUM 40 MG/0.4ML ~~LOC~~ SOLN
40.0000 mg | SUBCUTANEOUS | Status: DC
  Administered 2018-05-01: 40 mg via SUBCUTANEOUS
  Filled 2018-05-01: qty 0.4

## 2018-05-01 MED ORDER — ROPINIROLE HCL 1 MG PO TABS
1.0000 mg | ORAL_TABLET | Freq: Every day | ORAL | Status: DC
  Administered 2018-05-01 (×2): 1 mg via ORAL
  Filled 2018-05-01 (×2): qty 1

## 2018-05-01 MED ORDER — ONDANSETRON HCL 4 MG PO TABS: 4.0000 mg | ORAL_TABLET | Freq: Four times a day (QID) | ORAL | Status: DC | PRN

## 2018-05-01 MED ORDER — GUAIFENESIN-DM 100-10 MG/5ML PO SYRP
5.0000 mL | ORAL_SOLUTION | ORAL | Status: DC | PRN
  Administered 2018-05-01: 5 mL via ORAL
  Filled 2018-05-01: qty 5

## 2018-05-01 MED ORDER — ACETAMINOPHEN 325 MG PO TABS
650.0000 mg | ORAL_TABLET | Freq: Four times a day (QID) | ORAL | Status: DC | PRN
  Administered 2018-05-01: 650 mg via ORAL
  Filled 2018-05-01: qty 2

## 2018-05-01 MED ORDER — ACETAMINOPHEN 650 MG RE SUPP: 650.0000 mg | Freq: Four times a day (QID) | RECTAL | Status: DC | PRN

## 2018-05-01 MED ORDER — ONDANSETRON HCL 4 MG/2ML IJ SOLN: 4.0000 mg | Freq: Four times a day (QID) | INTRAMUSCULAR | Status: DC | PRN

## 2018-05-01 MED ORDER — DOCUSATE SODIUM 100 MG PO CAPS
100.0000 mg | ORAL_CAPSULE | Freq: Two times a day (BID) | ORAL | Status: DC
  Administered 2018-05-02: 09:00:00 100 mg via ORAL
  Filled 2018-05-01 (×2): qty 1

## 2018-05-01 MED ORDER — ALBUTEROL SULFATE HFA 108 (90 BASE) MCG/ACT IN AERS
1.0000 | INHALATION_SPRAY | Freq: Four times a day (QID) | RESPIRATORY_TRACT | Status: DC | PRN
  Administered 2018-05-01 (×2): 2 via RESPIRATORY_TRACT
  Filled 2018-05-01: qty 6.7

## 2018-05-01 NOTE — Progress Notes (Signed)
[facility] - [place] [facility]   PATIENT NAME: Brenda [name]    MR#:  [id number]  DATE OF BIRTH:  Jun 04, 1970  SUBJECTIVE:  Some dry cough. No fever. Continues to smoke  REVIEW OF SYSTEMS:   Review of Systems  Constitutional: Negative for chills, fever and weight loss.  HENT: Negative for ear discharge, ear pain and nosebleeds.   Eyes: Negative for blurred vision, pain and discharge.  Respiratory: Positive for cough and shortness of breath. Negative for sputum production, wheezing and stridor.   Cardiovascular: Negative for chest pain, palpitations, orthopnea and PND.  Gastrointestinal: Negative for abdominal pain, diarrhea, nausea and vomiting.  Genitourinary: Negative for frequency and urgency.  Musculoskeletal: Negative for back pain and joint pain.  Neurological: Negative for sensory change, speech change, focal weakness and weakness.  Psychiatric/Behavioral: Negative for depression and hallucinations. The patient is not nervous/anxious.    Tolerating Diet:yes  Tolerating PT: ambulatory  DRUG ALLERGIES:   Allergies  Allergen Reactions  . Flexeril [Cyclobenzaprine] Other (See Comments)    RLS-like symptoms   . Seroquel [Quetiapine Fumarate] Other (See Comments)    RLS-like symptoms    VITALS:  Blood pressure 104/77, pulse 70, temperature 98.6 F (37 C), temperature source Oral, resp. rate (!) 22, height 5\' 2"  (1.575 m), weight 87.2 kg, SpO2 98 %.  PHYSICAL EXAMINATION:   Physical Exam  GENERAL:  48 y.o.-year-old patient lying in the bed with no acute distress.  EYES: Pupils equal, round, reactive to light and accommodation. No scleral icterus. Extraocular muscles intact.  HEENT: Head atraumatic, normocephalic. Oropharynx and nasopharynx clear.  NECK:  Supple, no jugular venous distention. No thyroid enlargement, no tenderness.  LUNGS: Normal breath sounds bilaterally, no wheezing, rales,mild rhonchi. No use of accessory muscles of  respiration.  CARDIOVASCULAR: S1, S2 normal. No murmurs, rubs, or gallops.  ABDOMEN: Soft, nontender, nondistended. Bowel sounds present. No organomegaly or mass.  EXTREMITIES: No cyanosis, clubbing or edema b/l.    NEUROLOGIC: Cranial nerves II through XII are intact. No focal Motor or sensory deficits b/l.   PSYCHIATRIC:  patient is alert and oriented x 3.  SKIN: No obvious rash, lesion, or ulcer.   LABORATORY PANEL:  CBC Recent Labs  Lab 04/30/18 2016  WBC 19.9*  HGB 11.2*  HCT 37.2  PLT 309    Chemistries  Recent Labs  Lab 04/30/18 2016  NA 132*  K 4.2  CL 100  CO2 26  GLUCOSE 111*  BUN 5*  CREATININE 0.55  CALCIUM 8.8*   Cardiac Enzymes Recent Labs  Lab 04/30/18 2047  TROPONINI <0.03   RADIOLOGY:  Dg Chest Portable 1 View  Result Date: 04/30/2018 CLINICAL DATA:  48 year old female with cough and shortness of breath. EXAM: PORTABLE CHEST 1 VIEW COMPARISON:  Chest radiograph dated 05/01/2016 FINDINGS: Bilateral perihilar patchy areas of hazy airspace density noted which may represent edema versus pneumonia. Clinical correlation is recommended. There is no pleural effusion or pneumothorax. The cardiac silhouette is within normal limits. No acute osseous pathology. IMPRESSION: Findings likely represent edema although pneumonia is not excluded. Clinical correlation is recommended. Electronically Signed   By: Brenda [name] M.D.   On: 04/30/2018 20:09   ASSESSMENT AND PLAN:   Brenda [name] is a 48 y.o. female with history of asthma and other PMH as noted below who presents with shortness of breath, acute onset today, similar to prior asthma exacerbations, and associated with nonproductive cough over the last several days.  1. Sepsis:  -  patient meets criteria via documented tachycardia, tachypnea and leukocytosis.   - Blood cultures have been obtained and broad-spectrum antibiotics started. -  Source appears to be pneumonia.  Supplemental oxygen as needed.  2.  Community-acquired pneumonia:  -on IVceftriaxone and azithromycin.  Though she does not have travel risk factors or known contact with individuals with exposure to novel coronavirus, we must rule out contagious viral etiologies due to hypoxia and intermittent respiratory distress during pandemic crisis.  Respiratory isolation and droplet precautions until COVID-19 test results return.  3.  Asthma: The patient has some faint wheezing that occasionally clears with cough.  Schedule breathing treatments.  The patient received 1 dose of prednisone 60 mg in the emergency department. Will give oral inhalers and prednisone taper  4.  Tobacco abuse: NicoDerm patch while hospitalized. -advised cessation  5.  DVT prophylaxis: Lovenox  D/w pt if remains stable she will d/c tomorrow with self isolation. Pt agreeable   CODE STATUS: full   TOTAL TIME TAKING CARE OF THIS PATIENT: 30 minutes.  >50% time spent on counselling and coordination of care  POSSIBLE D/C IN 1 DAYS, DEPENDING ON CLINICAL CONDITION.  Note: This dictation was prepared with Dragon dictation along with smaller phrase technology. Any transcriptional errors that result from this process are unintentional.  Brenda [name] M.D on 05/01/2018 at 11:46 AM  Between 7am to 6pm - Pager - [phone]  After 6pm go to [email or web address] - password [email or web address]  [facility]  Office  [phone]  CC: Primary care physician; System, Pcp Not InPatient ID: Brenda [name], female   DOB: 14-Apr-1970, 48 y.o.   MRN: [id number]

## 2018-05-01 NOTE — H&P (Signed)
Brenda [name] is an 48 y.o. female.   Chief Complaint: Shortness of breath HPI: The patient with past medical history of depression and polysubstance abuse presents to the emergency department complaining of shortness of breath.  The patient also endorses subjective fever and a severe cough.  The patient admits that she always has a cough due to asthma as well as ongoing smoking but that her cough is deeper than usual.  Her symptoms began 24 hours ago.  Fever started today.  She denies sick contacts or travel out of state or contact with individuals who have been traveling.  Chest x-ray in the emergency department showed developing infiltrate and the patient's oxygen saturations would drop below 86% when ambulatory.  Sepsis protocol was initiated in the emergency department prior to the hospital service being called for further management.  Past Medical History:  Diagnosis Date  . Depression   . Polysubstance abuse     Past Surgical History:  Procedure Laterality Date  . TUBAL LIGATION      No family history on file.  No significant chronic medical illnesses although she reports various forms of cancer in relatives on her mother's side.  Social History:  reports that she has been smoking cigarettes. She has a 30.00 pack-year smoking history. She has never used smokeless tobacco. She reports current drug use. Frequency: 7.00 times per week. Drug: Cocaine. She reports that she does not drink alcohol.  Allergies:  Allergies  Allergen Reactions  . Seroquel [Quetiapine Fumarate] Other (See Comments)    Restless legs  . Flexeril [Cyclobenzaprine] Other (See Comments)    Increased muscle spasms    Medications Prior to Admission  Medication Sig Dispense Refill  . hydrOXYzine (ATARAX/VISTARIL) 50 MG tablet Take 1 tablet (50 mg total) by mouth 3 (three) times daily as needed for anxiety. 30 tablet 0  . pantoprazole (PROTONIX) 40 MG tablet Take 1 tablet (40 mg total) by mouth daily. 30  tablet 1  . QUEtiapine (SEROQUEL) 100 MG tablet Take 1 tablet (100 mg total) by mouth at bedtime. 30 tablet 1  . QUEtiapine (SEROQUEL) 25 MG tablet Take 1 tablet (25 mg total) by mouth 3 (three) times daily. 90 tablet 1  . rOPINIRole (REQUIP) 1 MG tablet Take 1 tablet (1 mg total) by mouth at bedtime. 30 tablet 1    Results for orders placed or performed during the hospital encounter of 04/30/18 (from the past 48 hour(s))  Basic metabolic panel     Status: Abnormal   Collection Time: 04/30/18  8:16 PM  Result Value Ref Range   Sodium 132 (L) 135 - 145 mmol/L   Potassium 4.2 3.5 - 5.1 mmol/L   Chloride 100 98 - 111 mmol/L   CO2 26 22 - 32 mmol/L   Glucose, Bld 111 (H) 70 - 99 mg/dL   BUN 5 (L) 6 - 20 mg/dL   Creatinine, Ser 3.15 0.44 - 1.00 mg/dL   Calcium 8.8 (L) 8.9 - 10.3 mg/dL   GFR calc non Af Amer >60 >60 mL/min   GFR calc Af Amer >60 >60 mL/min   Anion gap 6 5 - 15    Comment: Performed at [facility], [address] [place], [place] [address]  CBC with Differential     Status: Abnormal   Collection Time: 04/30/18  8:16 PM  Result Value Ref Range   WBC 19.9 (H) 4.0 - 10.5 K/uL   RBC 5.45 (H) 3.87 - 5.11 MIL/uL   Hemoglobin 11.2 (  L) 12.0 - 15.0 g/dL   HCT 16.5 79.0 - 38.3 %   MCV 68.3 (L) 80.0 - 100.0 fL   MCH 20.6 (L) 26.0 - 34.0 pg   MCHC 30.1 30.0 - 36.0 g/dL   RDW 33.8 (H) 32.9 - 19.1 %   Platelets 309 150 - 400 K/uL   nRBC 0.0 0.0 - 0.2 %   Neutrophils Relative % 86 %   Neutro Abs 17.0 (H) 1.7 - 7.7 K/uL   Lymphocytes Relative 8 %   Lymphs Abs 1.5 0.7 - 4.0 K/uL   Monocytes Relative 4 %   Monocytes Absolute 0.7 0.1 - 1.0 K/uL   Eosinophils Relative 1 %   Eosinophils Absolute 0.2 0.0 - 0.5 K/uL   Basophils Relative 0 %   Basophils Absolute 0.1 0.0 - 0.1 K/uL   WBC Morphology MORPHOLOGY UNREMARKABLE    RBC Morphology MORPHOLOGY UNREMARKABLE    Smear Review MORPHOLOGY UNREMARKABLE    Immature Granulocytes 1 %   Abs Immature Granulocytes 0.16 (H)  0.00 - 0.07 K/uL    Comment: Performed at [facility], [address] [place], [place] [address]  Procalcitonin     Status: None   Collection Time: 04/30/18  8:16 PM  Result Value Ref Range   Procalcitonin 0.53 ng/mL    Comment:        Interpretation: PCT > 0.5 ng/mL and <= 2 ng/mL: Systemic infection (sepsis) is possible, but other conditions are known to elevate PCT as well. (NOTE)       Sepsis PCT Algorithm           Lower Respiratory Tract                                      Infection PCT Algorithm    ----------------------------     ----------------------------         PCT < 0.25 ng/mL                PCT < 0.10 ng/mL         Strongly encourage             Strongly discourage   discontinuation of antibiotics    initiation of antibiotics    ----------------------------     -----------------------------       PCT 0.25 - 0.50 ng/mL            PCT 0.10 - 0.25 ng/mL               OR       >80% decrease in PCT            Discourage initiation of                                            antibiotics      Encourage discontinuation           of antibiotics    ----------------------------     -----------------------------         PCT >= 0.50 ng/mL              PCT 0.26 - 0.50 ng/mL                AND       <80% decrease in PCT  Encourage initiation of                                             antibiotics       Encourage continuation           of antibiotics    ----------------------------     -----------------------------        PCT >= 0.50 ng/mL                  PCT > 0.50 ng/mL               AND         increase in PCT                  Strongly encourage                                      initiation of antibiotics    Strongly encourage escalation           of antibiotics                                     -----------------------------                                           PCT <= 0.25 ng/mL                                                 OR                                         > 80% decrease in PCT                                     Discontinue / Do not initiate                                             antibiotics Performed at [facility], [address] [place], [place] [address]   Troponin I - Once     Status: None   Collection Time: 04/30/18  8:47 PM  Result Value Ref Range   Troponin I <0.03 <0.03 ng/mL    Comment: Performed at [facility], [address] [place], [place] [address]  Lactic acid, plasma     Status: None   Collection Time: 04/30/18  8:47 PM  Result Value Ref Range   Lactic Acid, Venous 1.5 0.5 - 1.9 mmol/L    Comment: Performed at [facility], [address] [place], [place] [address]   Dg Chest Portable 1 View  Result Date: 04/30/2018 CLINICAL DATA:  48 year old female  with cough and shortness of breath. EXAM: PORTABLE CHEST 1 VIEW COMPARISON:  Chest radiograph dated 05/01/2016 FINDINGS: Bilateral perihilar patchy areas of hazy airspace density noted which may represent edema versus pneumonia. Clinical correlation is recommended. There is no pleural effusion or pneumothorax. The cardiac silhouette is within normal limits. No acute osseous pathology. IMPRESSION: Findings likely represent edema although pneumonia is not excluded. Clinical correlation is recommended. Electronically Signed   By: Elgie Collard M.D.   On: 04/30/2018 20:09    Review of Systems  Constitutional: Negative for chills and fever.  HENT: Negative for sore throat and tinnitus.   Eyes: Negative for blurred vision and redness.  Respiratory: Positive for cough, shortness of breath and wheezing. Negative for sputum production.   Cardiovascular: Negative for chest pain, palpitations, orthopnea and PND.  Gastrointestinal: Negative for abdominal pain, diarrhea, nausea and vomiting.  Genitourinary: Negative for dysuria, frequency and urgency.  Musculoskeletal: Negative for joint pain and myalgias.   Skin: Negative for rash.       No lesions  Neurological: Negative for speech change, focal weakness and weakness.  Endo/Heme/Allergies: Does not bruise/bleed easily.       No temperature intolerance  Psychiatric/Behavioral: Negative for depression and suicidal ideas.    Blood pressure 113/70, pulse 99, temperature 99.2 F (37.3 C), temperature source Oral, resp. rate (!) 22, height  (1.575 m), weight 88.4 kg, SpO2 98 %. Physical Exam  Vitals reviewed. Constitutional: She is oriented to person, place, and time. She appears well-developed and well-nourished. No distress. Nasal cannula in place.  HENT:  Head: Normocephalic and atraumatic.  Mouth/Throat: Oropharynx is clear and moist.  Eyes: Pupils are equal, round, and reactive to light. Conjunctivae and EOM are normal. No scleral icterus.  Neck: Normal range of motion. Neck supple. No JVD present. No tracheal deviation present. No thyromegaly present.  Cardiovascular: Normal rate, regular rhythm and normal heart sounds. Exam reveals no gallop and no friction rub.  No murmur heard. Respiratory: Effort normal. She has wheezes.  GI: Soft. Bowel sounds are normal. She exhibits no distension. There is no abdominal tenderness.  Genitourinary:    Genitourinary Comments: Deferred   Musculoskeletal: Normal range of motion.        General: No edema (trace).  Lymphadenopathy:    She has no cervical adenopathy.  Neurological: She is alert and oriented to person, place, and time. No cranial nerve deficit. She exhibits normal muscle tone.  Skin: Skin is warm and dry. No rash noted. No erythema.  Psychiatric: She has a normal mood and affect. Her behavior is normal. Judgment and thought content normal.     Assessment/Plan This is a 49-year-old female admitted for sepsis. 1.  Sepsis: The patient meets criteria via documented tachycardia, tachypnea and leukocytosis.  She is hemodynamically stable.  Blood cultures have been obtained and  broad-spectrum antibiotics started.  Source appears to be pneumonia.  Supplemental oxygen as needed. 2.  Community-acquired pneumonia: She has received ceftriaxone and azithromycin.  Though she does not have travel risk factors or known contact with individuals with exposure to novel coronavirus, we must rule out contagious viral etiologies due to hypoxia and intermittent respiratory distress during pandemic crisis.  Respiratory isolation and droplet precautions until COVID-19 test results return. 3.  Asthma: The patient has some faint wheezing that occasionally clears with cough.  Schedule breathing treatments.  The patient received 1 dose of prednisone 60 mg in the emergency department. 4.  Tobacco abuse: NicoDerm patch while hospitalized. 5.  DVT prophylaxis: Lovenox 6.  GI prophylaxis: None The patient is a full code.  Time spent on admission orders and patient care approximately 45 minutes   Arnaldo Nataliamond,  Zelphia Glover S, MD 05/01/2018, 1:14 AM

## 2018-05-02 LAB — CBC
HCT: 37.6 % (ref 36.0–46.0)
Hemoglobin: 10.9 g/dL — ABNORMAL LOW (ref 12.0–15.0)
MCH: 20.6 pg — ABNORMAL LOW (ref 26.0–34.0)
MCHC: 29 g/dL — ABNORMAL LOW (ref 30.0–36.0)
MCV: 71.2 fL — ABNORMAL LOW (ref 80.0–100.0)
Platelets: 275 10*3/uL (ref 150–400)
RBC: 5.28 MIL/uL — ABNORMAL HIGH (ref 3.87–5.11)
RDW: 20.7 % — ABNORMAL HIGH (ref 11.5–15.5)
WBC: 21.4 10*3/uL — ABNORMAL HIGH (ref 4.0–10.5)
nRBC: 0 % (ref 0.0–0.2)

## 2018-05-02 MED ORDER — CEPHALEXIN 500 MG PO CAPS
500.0000 mg | ORAL_CAPSULE | Freq: Two times a day (BID) | ORAL | Status: DC
  Administered 2018-05-02: 12:00:00 500 mg via ORAL
  Filled 2018-05-02: qty 1

## 2018-05-02 MED ORDER — GUAIFENESIN-DM 100-10 MG/5ML PO SYRP: 5.0000 mL | ORAL_SOLUTION | ORAL | 0 refills | Status: DC | PRN

## 2018-05-02 MED ORDER — DOXYCYCLINE HYCLATE 100 MG PO TABS: 100.0000 mg | ORAL_TABLET | Freq: Two times a day (BID) | ORAL | 0 refills | Status: DC

## 2018-05-02 MED ORDER — AZITHROMYCIN 250 MG PO TABS: 250.0000 mg | ORAL_TABLET | Freq: Every day | ORAL | Status: DC

## 2018-05-02 MED ORDER — DOXYCYCLINE HYCLATE 100 MG PO TABS
100.0000 mg | ORAL_TABLET | Freq: Two times a day (BID) | ORAL | Status: DC
  Administered 2018-05-02: 100 mg via ORAL
  Filled 2018-05-02: qty 1

## 2018-05-02 MED ORDER — CEPHALEXIN 500 MG PO CAPS: 500.0000 mg | ORAL_CAPSULE | Freq: Two times a day (BID) | ORAL | 0 refills | Status: DC

## 2018-05-02 NOTE — Discharge Summary (Signed)
[facility] [place] at [facility]   PATIENT NAME: Brenda [name]    MR#:  [id number]  DATE OF BIRTH:  11/05/1970  DATE OF ADMISSION:  04/30/2018 ADMITTING PHYSICIAN: Arnaldo Natal, MD  DATE OF DISCHARGE:   PRIMARY CARE PHYSICIAN: System, Pcp Not In    ADMISSION DIAGNOSIS:  Lower respiratory infection [J22] Acute respiratory failure with hypoxia (HCC) [J96.01]  DISCHARGE DIAGNOSIS:   Pneumonia Acute respiratory failure with hypoxia--now stable on RA Ongoing tobacco abuse  SECONDARY DIAGNOSIS:   Past Medical History:  Diagnosis Date  . Depression   . Polysubstance abuse [facility])     HOSPITAL COURSE:  Brenda [name] a 48 y.o.femalewith history of asthma and other PMH as noted below who presents with shortness of breath, acute onset today, similar to prior asthma exacerbations, and associated with nonproductive cough over the last several days.  1. Sepsis:  - patient meets criteria via documented tachycardia, tachypnea and leukocytosis.  -Blood cultures negative - Source appears to be pneumonia. Supplemental oxygen as needed--weaned to RA  2. Community-acquired pneumonia:  -on IVceftriaxone and azithromycin--change to po abxs -Though she does not havetravel risk factors or known contact with individuals with exposure to novel coronavirus,Respiratory isolation and droplet precautions until COVID-19 test results pending at discharge. Advised to quarantine herself.  3.Asthma mild exacerbation - patient has some faint wheezing that occasionally clears with cough.  -  Will give oral inhalers and prednisone taper  4. Tobacco abuse: NicoDerm patch while hospitalized. -advised cessation  5. DVT prophylaxis: Lovenox  Pt will d/c home--agreeable  CONSULTS OBTAINED:    DRUG ALLERGIES:   Allergies  Allergen Reactions  . Flexeril [Cyclobenzaprine] Other (See Comments)    RLS-like symptoms   . Seroquel [Quetiapine  Fumarate] Other (See Comments)    RLS-like symptoms    DISCHARGE MEDICATIONS:   Allergies as of 05/02/2018      Reactions   Flexeril [cyclobenzaprine] Other (See Comments)   RLS-like symptoms    Seroquel [quetiapine Fumarate] Other (See Comments)   RLS-like symptoms      Medication List    STOP taking these medications   hydrOXYzine 50 MG tablet Commonly known as:  ATARAX/VISTARIL   pantoprazole 40 MG tablet Commonly known as:  PROTONIX   QUEtiapine 100 MG tablet Commonly known as:  SEROQUEL   QUEtiapine 25 MG tablet Commonly known as:  SEROQUEL   rOPINIRole 1 MG tablet Commonly known as:  REQUIP     TAKE these medications   albuterol 108 (90 Base) MCG/ACT inhaler Commonly known as:  PROVENTIL HFA;VENTOLIN HFA Inhale 2 puffs into the lungs every 4 (four) hours as needed for wheezing or shortness of breath.   cephALEXin 500 MG capsule Commonly known as:  KEFLEX Take 1 capsule (500 mg total) by mouth every 12 (twelve) hours.   doxycycline 100 MG tablet Commonly known as:  VIBRA-TABS Take 1 tablet (100 mg total) by mouth every 12 (twelve) hours.   guaiFENesin-dextromethorphan 100-10 MG/5ML syrup Commonly known as:  ROBITUSSIN DM Take 5 mLs by mouth every 4 (four) hours as needed for cough.   predniSONE 20 MG tablet Commonly known as:  Deltasone Take 3 tablets (60 mg total) by mouth daily with breakfast for 4 days. Start the day after the ED visit       If you experience worsening of your admission symptoms, develop shortness of breath, life threatening emergency, suicidal or homicidal thoughts you must seek medical attention immediately by calling 911 or  calling your MD immediately  if symptoms less severe.  You Must read complete instructions/literature along with all the possible adverse reactions/side effects for all the Medicines you take and that have been prescribed to you. Take any new Medicines after you have completely understood and accept all the  possible adverse reactions/side effects.   Please note  You were cared for by a hospitalist during your hospital stay. If you have any questions about your discharge medications or the care you received while you were in the hospital after you are discharged, you can call the unit and asked to speak with the hospitalist on call if the hospitalist that took care of you is not available. Once you are discharged, your primary care physician will handle any further medical issues. Please note that NO REFILLS for any discharge medications will be authorized once you are discharged, as it is imperative that you return to your primary care physician (or establish a relationship with a primary care physician if you do not have one) for your aftercare needs so that they can reassess your need for medications and monitor your lab values. Today   SUBJECTIVE   Doing ok  VITAL SIGNS:  Blood pressure 131/80, pulse 100, temperature 99.7 F (37.6 C), temperature source Oral, resp. rate 18, height 5\' 2"  (1.575 m), weight 88.8 kg, SpO2 90 %.  I/O:  No intake or output data in the 24 hours ending 05/02/18 1113  PHYSICAL EXAMINATION:  GENERAL:  48 y.o.-year-old patient lying in the bed with no acute distress.  EYES: Pupils equal, round, reactive to light and accommodation. No scleral icterus. Extraocular muscles intact.  HEENT: Head atraumatic, normocephalic. Oropharynx and nasopharynx clear.  NECK:  Supple, no jugular venous distention. No thyroid enlargement, no tenderness.  LUNGS: decreased breath sounds bilaterally basally, no wheezing, rales,rhonchi or crepitation. No use of accessory muscles of respiration.  CARDIOVASCULAR: S1, S2 normal. No murmurs, rubs, or gallops.  ABDOMEN: Soft, non-tender, non-distended. Bowel sounds present. No organomegaly or mass.  EXTREMITIES: No pedal edema, cyanosis, or clubbing.  NEUROLOGIC: Cranial nerves II through XII are intact. Muscle strength 5/5 in all extremities.  Sensation intact. Gait not checked.  PSYCHIATRIC: The patient is alert and oriented x 3.  SKIN: No obvious rash, lesion, or ulcer.   DATA REVIEW:   CBC  Recent Labs  Lab 05/02/18 0429  WBC 21.4*  HGB 10.9*  HCT 37.6  PLT 275    Chemistries  Recent Labs  Lab 04/30/18 2016  NA 132*  K 4.2  CL 100  CO2 26  GLUCOSE 111*  BUN 5*  CREATININE 0.55  CALCIUM 8.8*    Microbiology Results   Recent Results (from the past 240 hour(s))  Blood Culture (routine x 2)     Status: None (Preliminary result)   Collection Time: 04/30/18 11:15 PM  Result Value Ref Range Status   Specimen Description BLOOD RIGHT WRIST  Final   Special Requests   Final    BOTTLES DRAWN AEROBIC AND ANAEROBIC Blood Culture results may not be optimal due to an excessive volume of blood received in culture bottles   Culture   Final    NO GROWTH 2 DAYS Performed at [facility], [address], [place], [place] [address]    Report Status PENDING  Incomplete  Blood Culture (routine x 2)     Status: None (Preliminary result)   Collection Time: 04/30/18 11:15 PM  Result Value Ref Range Status   Specimen Description BLOOD  LEFT HAND  Final   Special Requests   Final    BOTTLES DRAWN AEROBIC AND ANAEROBIC Blood Culture results may not be optimal due to an excessive volume of blood received in culture bottles   Culture   Final    NO GROWTH 2 DAYS Performed at [facility], [address], [place], [place] [address]    Report Status PENDING  Incomplete    RADIOLOGY:  Dg Chest Portable 1 View  Result Date: 04/30/2018 CLINICAL DATA:  48 year old female with cough and shortness of breath. EXAM: PORTABLE CHEST 1 VIEW COMPARISON:  Chest radiograph dated 05/01/2016 FINDINGS: Bilateral perihilar patchy areas of hazy airspace density noted which may represent edema versus pneumonia. Clinical correlation is recommended. There is no pleural effusion or pneumothorax. The cardiac silhouette is within  normal limits. No acute osseous pathology. IMPRESSION: Findings likely represent edema although pneumonia is not excluded. Clinical correlation is recommended. Electronically Signed   By: Elgie Collard M.D.   On: 04/30/2018 20:09     CODE STATUS:     Code Status Orders  (From admission, onward)         Start     Ordered   05/01/18 0027  Full code  Continuous     05/01/18 0026        Code Status History    Date Active Date Inactive Code Status Order ID Comments User Context   05/01/2016 2036 05/04/2016 1944 Full Code [id number]  Clapacs, Jackquline Denmark, MD Inpatient      TOTAL TIME TAKING CARE OF THIS PATIENT: **40* minutes.    Enedina Finner M.D on 05/02/2018 at 11:13 AM  Between 7am to 6pm - Pager - [phone] After 6pm go to [email or web address] - password [email or web address]  [facility]  Office  [phone]  CC: Primary care physician; System, Pcp Not In

## 2018-05-02 NOTE — Progress Notes (Signed)
Patient is alert and oriented and able to verbalize needs. No complaints of pain at this time. VSS. PIVs removed. Printed AVS and COVID dc instructions given to patient in the discharge packet. No concerns voiced at this time. Patient called son to transport her home.   Suzan Slick, RN

## 2018-05-02 NOTE — TOC Transition Note (Signed)
Transition of Care [facility]) - CM/SW Discharge Note   Patient Details  Name: Brenda [name] MRN: [id number] Date of Birth: [birth year]  Transition of Care [facility]) CM/SW Contact:  Judi Cong, LCSW Phone Number: 05/02/2018, 11:31 AM   Clinical Narrative:   The CSW received a consult for PCP and possible medication assistance as the patient has no insurance. Due to COVID-19 protocols, the CSW could not have a face-to-face discussion with the patient as she is in isolation. The CSW provided the resources to the patient's attending RN to include in the discharge packet. The CSW provided a list of [address] resources for substance use, food security, and housing; a card with directions on how to utilize the [employer] and Medication [employer]; and a booklet with listings of free and low cost healthcare in [place]. The [facility] team is signing off. Please consult should needs arise.    Final next level of care: Home/Self Care Barriers to Discharge: No Barriers Identified   Patient Goals and CMS Choice   CMS Medicare.gov Compare Post Acute Care list provided to:: Other (Comment Required)(N/A) Choice offered to / list presented to : NA  Discharge Placement                       Discharge Plan and Services In-house Referral: PCP / Health Connect Discharge Planning Services: NA Post Acute Care Choice: NA          DME Arranged: N/A DME Agency: NA HH Arranged: NA HH Agency: NA   Social Determinants of Health (SDOH) Interventions     Readmission Risk Interventions No flowsheet data found.

## 2018-05-02 NOTE — Discharge Instructions (Signed)
Take the antibiotic and prednisone as prescribed and finish the full course.  You can take Tylenol as needed for fever.  Return to the ER immediately for new or worsening shortness of breath, chest pain, high fevers, weakness, or any other new or worsening symptoms that concern you.  At this time you do not meet criteria to be tested for COVID-19.  However you should isolate at home in case you do have it.  Instructions are provided below.     Person Under Monitoring Name: Brenda [name]  Location: [address] [place] [address]   Infection Prevention Recommendations for Individuals Confirmed to have, or Being Evaluated for, 2019 Novel Coronavirus (COVID-19) Infection Who Receive Care at Home  Individuals who are confirmed to have, or are being evaluated for, COVID-19 should follow the prevention steps below until a healthcare provider or local or state health department says they can return to normal activities.  Stay home except to get medical care You should restrict activities outside your home, except for getting medical care. Do not go to work, school, or public areas, and do not use public transportation or taxis.  Call ahead before visiting your doctor Before your medical appointment, call the healthcare provider and tell them that you have, or are being evaluated for, COVID-19 infection. This will help the healthcare providers office take steps to keep other people from getting infected. Ask your healthcare provider to call the local or state health department.  Monitor your symptoms Seek prompt medical attention if your illness is worsening (e.g., difficulty breathing). Before going to your medical appointment, call the healthcare provider and tell them that you have, or are being evaluated for, COVID-19 infection. Ask your healthcare provider to call the local or state health department.  Wear a facemask You should wear a facemask that covers your nose and  mouth when you are in the same room with other people and when you visit a healthcare provider. People who live with or visit you should also wear a facemask while they are in the same room with you.  Separate yourself from other people in your home As much as possible, you should stay in a different room from other people in your home. Also, you should use a separate bathroom, if available.  Avoid sharing household items You should not share dishes, drinking glasses, cups, eating utensils, towels, bedding, or other items with other people in your home. After using these items, you should wash them thoroughly with soap and water.  Cover your coughs and sneezes Cover your mouth and nose with a tissue when you cough or sneeze, or you can cough or sneeze into your sleeve. Throw used tissues in a lined trash can, and immediately wash your hands with soap and water for at least 20 seconds or use an alcohol-based hand rub.  Wash your [employer] your hands often and thoroughly with soap and water for at least 20 seconds. You can use an alcohol-based hand sanitizer if soap and water are not available and if your hands are not visibly dirty. Avoid touching your eyes, nose, and mouth with unwashed hands.   Prevention Steps for Caregivers and Household Members of Individuals Confirmed to have, or Being Evaluated for, COVID-19 Infection Being Cared for in the Home  If you live with, or provide care at home for, a person confirmed to have, or being evaluated for, COVID-19 infection please follow these guidelines to prevent infection:  Follow healthcare providers instructions Make  sure that you understand and can help the patient follow any healthcare provider instructions for all care.  Provide for the patients basic needs You should help the patient with basic needs in the home and provide support for getting groceries, prescriptions, and other personal needs.  Monitor the patients  symptoms If they are getting sicker, call his or her medical provider and tell them that the patient has, or is being evaluated for, COVID-19 infection. This will help the healthcare providers office take steps to keep other people from getting infected. Ask the healthcare provider to call the local or state health department.  Limit the number of people who have contact with the patient  If possible, have only one caregiver for the patient.  Other household members should stay in another home or place of residence. If this is not possible, they should stay  in another room, or be separated from the patient as much as possible. Use a separate bathroom, if available.  Restrict visitors who do not have an essential need to be in the home.  Keep older adults, very young children, and other sick people away from the patient Keep older adults, very young children, and those who have compromised immune systems or chronic health conditions away from the patient. This includes people with chronic heart, lung, or kidney conditions, diabetes, and cancer.  Ensure good ventilation Make sure that shared spaces in the home have good air flow, such as from an air conditioner or an opened window, weather permitting.  Wash your hands often  Wash your hands often and thoroughly with soap and water for at least 20 seconds. You can use an alcohol based hand sanitizer if soap and water are not available and if your hands are not visibly dirty.  Avoid touching your eyes, nose, and mouth with unwashed hands.  Use disposable paper towels to dry your hands. If not available, use dedicated cloth towels and replace them when they become wet.  Wear a facemask and gloves  Wear a disposable facemask at all times in the room and gloves when you touch or have contact with the patients blood, body fluids, and/or secretions or excretions, such as sweat, saliva, sputum, nasal mucus, vomit, urine, or feces.  Ensure  the mask fits over your nose and mouth tightly, and do not touch it during use.  Throw out disposable facemasks and gloves after using them. Do not reuse.  Wash your hands immediately after removing your facemask and gloves.  If your personal clothing becomes contaminated, carefully remove clothing and launder. Wash your hands after handling contaminated clothing.  Place all used disposable facemasks, gloves, and other waste in a lined container before disposing them with other household waste.  Remove gloves and wash your hands immediately after handling these items.  Do not share dishes, glasses, or other household items with the patient  Avoid sharing household items. You should not share dishes, drinking glasses, cups, eating utensils, towels, bedding, or other items with a patient who is confirmed to have, or being evaluated for, COVID-19 infection.  After the person uses these items, you should wash them thoroughly with soap and water.  Wash laundry thoroughly  Immediately remove and wash clothes or bedding that have blood, body fluids, and/or secretions or excretions, such as sweat, saliva, sputum, nasal mucus, vomit, urine, or feces, on them.  Wear gloves when handling laundry from the patient.  Read and follow directions on labels of laundry or clothing items and detergent. In  general, wash and dry with the warmest temperatures recommended on the label.  Clean all areas the individual has used often  Clean all touchable surfaces, such as counters, tabletops, doorknobs, bathroom fixtures, toilets, phones, keyboards, tablets, and bedside tables, every day. Also, clean any surfaces that may have blood, body fluids, and/or secretions or excretions on them.  Wear gloves when cleaning surfaces the patient has come in contact with.  Use a diluted bleach solution (e.g., dilute bleach with 1 part bleach and 10 parts water) or a household disinfectant with a label that says  EPA-registered for coronaviruses. To make a bleach solution at home, add 1 tablespoon of bleach to 1 quart (4 cups) of water. For a larger supply, add  cup of bleach to 1 gallon (16 cups) of water.  Read labels of cleaning products and follow recommendations provided on product labels. Labels contain instructions for safe and effective use of the cleaning product including precautions you should take when applying the product, such as wearing gloves or eye protection and making sure you have good ventilation during use of the product.  Remove gloves and wash hands immediately after cleaning.  Monitor yourself for signs and symptoms of illness Caregivers and household members are considered close contacts, should monitor their health, and will be asked to limit movement outside of the home to the extent possible. Follow the monitoring steps for close contacts listed on the symptom monitoring form.   ? If you have additional questions, contact your local health department or call the epidemiologist on call at [phone] (available 24/7). ? This guidance is subject to change. For the most up-to-date guidance from [facility], please refer to their website: [email or web address]

## 2018-05-03 LAB — NOVEL CORONAVIRUS, NAA (HOSP ORDER, SEND-OUT TO REF LAB; TAT 18-24 HRS): SARS-CoV-2, NAA: NOT DETECTED

## 2018-05-04 ENCOUNTER — Telehealth: Payer: Self-pay | Admitting: Emergency Medicine

## 2018-05-04 NOTE — Telephone Encounter (Signed)
Called patient to assure she is aware of negative covid 19.  Left message.

## 2018-05-05 LAB — CULTURE, BLOOD (ROUTINE X 2)
Culture: NO GROWTH
Culture: NO GROWTH

## 2018-05-05 NOTE — Telephone Encounter (Signed)
Patient called me back and I gave her results of covid test

## 2018-05-22 ENCOUNTER — Emergency Department: Payer: Self-pay

## 2018-05-22 ENCOUNTER — Inpatient Hospital Stay
Admission: EM | Admit: 2018-05-22 | Discharge: 2018-05-25 | DRG: 871 | Disposition: A | Discharge status: Home / Self Care | Payer: Self-pay | Attending: Internal Medicine | Admitting: Internal Medicine

## 2018-05-22 ENCOUNTER — Other Ambulatory Visit: Payer: Self-pay

## 2018-05-22 DIAGNOSIS — A4150 Gram-negative sepsis, unspecified: Principal | ICD-10-CM | POA: Diagnosis present

## 2018-05-22 DIAGNOSIS — N12 Tubulo-interstitial nephritis, not specified as acute or chronic: Secondary | ICD-10-CM | POA: Diagnosis present

## 2018-05-22 DIAGNOSIS — R6520 Severe sepsis without septic shock: Secondary | ICD-10-CM

## 2018-05-22 DIAGNOSIS — J9601 Acute respiratory failure with hypoxia: Secondary | ICD-10-CM | POA: Diagnosis present

## 2018-05-22 DIAGNOSIS — N179 Acute kidney failure, unspecified: Secondary | ICD-10-CM | POA: Diagnosis present

## 2018-05-22 DIAGNOSIS — Z79899 Other long term (current) drug therapy: Secondary | ICD-10-CM

## 2018-05-22 DIAGNOSIS — D509 Iron deficiency anemia, unspecified: Secondary | ICD-10-CM | POA: Diagnosis present

## 2018-05-22 DIAGNOSIS — N390 Urinary tract infection, site not specified: Secondary | ICD-10-CM

## 2018-05-22 DIAGNOSIS — F17210 Nicotine dependence, cigarettes, uncomplicated: Secondary | ICD-10-CM | POA: Diagnosis present

## 2018-05-22 DIAGNOSIS — J45901 Unspecified asthma with (acute) exacerbation: Secondary | ICD-10-CM | POA: Diagnosis present

## 2018-05-22 DIAGNOSIS — B1920 Unspecified viral hepatitis C without hepatic coma: Secondary | ICD-10-CM | POA: Diagnosis present

## 2018-05-22 DIAGNOSIS — A419 Sepsis, unspecified organism: Secondary | ICD-10-CM | POA: Diagnosis present

## 2018-05-22 DIAGNOSIS — J189 Pneumonia, unspecified organism: Secondary | ICD-10-CM

## 2018-05-22 DIAGNOSIS — K219 Gastro-esophageal reflux disease without esophagitis: Secondary | ICD-10-CM | POA: Diagnosis present

## 2018-05-22 DIAGNOSIS — F329 Major depressive disorder, single episode, unspecified: Secondary | ICD-10-CM | POA: Diagnosis present

## 2018-05-22 DIAGNOSIS — Z20828 Contact with and (suspected) exposure to other viral communicable diseases: Secondary | ICD-10-CM | POA: Diagnosis present

## 2018-05-22 LAB — SARS CORONAVIRUS 2 BY RT PCR (HOSPITAL ORDER, PERFORMED IN ~~LOC~~ HOSPITAL LAB): SARS Coronavirus 2: NEGATIVE

## 2018-05-22 LAB — CBC WITH DIFFERENTIAL/PLATELET
Abs Immature Granulocytes: 0 10*3/uL (ref 0.00–0.07)
Basophils Absolute: 0 10*3/uL (ref 0.0–0.1)
Basophils Relative: 0 %
Eosinophils Absolute: 0 10*3/uL (ref 0.0–0.5)
Eosinophils Relative: 0 %
HCT: 36.2 % (ref 36.0–46.0)
Hemoglobin: 10.7 g/dL — ABNORMAL LOW (ref 12.0–15.0)
Lymphocytes Relative: 11 %
Lymphs Abs: 2.5 10*3/uL (ref 0.7–4.0)
MCH: 19.9 pg — ABNORMAL LOW (ref 26.0–34.0)
MCHC: 29.6 g/dL — ABNORMAL LOW (ref 30.0–36.0)
MCV: 67.3 fL — ABNORMAL LOW (ref 80.0–100.0)
Monocytes Absolute: 1.6 10*3/uL — ABNORMAL HIGH (ref 0.1–1.0)
Monocytes Relative: 7 %
Neutro Abs: 18.9 10*3/uL — ABNORMAL HIGH (ref 1.7–7.7)
Neutrophils Relative %: 82 %
Platelets: 283 10*3/uL (ref 150–400)
RBC: 5.38 MIL/uL — ABNORMAL HIGH (ref 3.87–5.11)
RDW: 20.5 % — ABNORMAL HIGH (ref 11.5–15.5)
WBC: 23 10*3/uL — ABNORMAL HIGH (ref 4.0–10.5)
nRBC: 0 % (ref 0.0–0.2)

## 2018-05-22 LAB — BRAIN NATRIURETIC PEPTIDE: B Natriuretic Peptide: 145 pg/mL — ABNORMAL HIGH (ref 0.0–100.0)

## 2018-05-22 LAB — BASIC METABOLIC PANEL
Anion gap: 13 (ref 5–15)
Anion gap: 14 (ref 5–15)
BUN: 31 mg/dL — ABNORMAL HIGH (ref 6–20)
BUN: 31 mg/dL — ABNORMAL HIGH (ref 6–20)
CO2: 22 mmol/L (ref 22–32)
CO2: 20 mmol/L — ABNORMAL LOW (ref 22–32)
Calcium: 7.3 mg/dL — ABNORMAL LOW (ref 8.9–10.3)
Calcium: 7.2 mg/dL — ABNORMAL LOW (ref 8.9–10.3)
Chloride: 103 mmol/L (ref 98–111)
Chloride: 102 mmol/L (ref 98–111)
Creatinine, Ser: 2.29 mg/dL — ABNORMAL HIGH (ref 0.44–1.00)
Creatinine, Ser: 2.22 mg/dL — ABNORMAL HIGH (ref 0.44–1.00)
GFR calc Af Amer: 29 mL/min — ABNORMAL LOW (ref >60)
GFR calc Af Amer: 30 mL/min — ABNORMAL LOW (ref >60)
GFR calc non Af Amer: 25 mL/min — ABNORMAL LOW (ref >60)
GFR calc non Af Amer: 26 mL/min — ABNORMAL LOW (ref >60)
Glucose, Bld: 116 mg/dL — ABNORMAL HIGH (ref 70–99)
Glucose, Bld: 116 mg/dL — ABNORMAL HIGH (ref 70–99)
Potassium: 4.6 mmol/L (ref 3.5–5.1)
Potassium: 4.1 mmol/L (ref 3.5–5.1)
Sodium: 138 mmol/L (ref 135–145)
Sodium: 136 mmol/L (ref 135–145)

## 2018-05-22 LAB — COMPREHENSIVE METABOLIC PANEL
ALT: 58 U/L — ABNORMAL HIGH (ref 0–44)
AST: 53 U/L — ABNORMAL HIGH (ref 15–41)
Albumin: 3 g/dL — ABNORMAL LOW (ref 3.5–5.0)
Alkaline Phosphatase: 102 U/L (ref 38–126)
Anion gap: 18 — ABNORMAL HIGH (ref 5–15)
BUN: 29 mg/dL — ABNORMAL HIGH (ref 6–20)
CO2: 20 mmol/L — ABNORMAL LOW (ref 22–32)
Calcium: 8.1 mg/dL — ABNORMAL LOW (ref 8.9–10.3)
Chloride: 97 mmol/L — ABNORMAL LOW (ref 98–111)
Creatinine, Ser: 2.34 mg/dL — ABNORMAL HIGH (ref 0.44–1.00)
GFR calc Af Amer: 28 mL/min — ABNORMAL LOW (ref >60)
GFR calc non Af Amer: 24 mL/min — ABNORMAL LOW (ref >60)
Glucose, Bld: 145 mg/dL — ABNORMAL HIGH (ref 70–99)
Potassium: 3.7 mmol/L (ref 3.5–5.1)
Sodium: 135 mmol/L (ref 135–145)
Total Bilirubin: 0.7 mg/dL (ref 0.3–1.2)
Total Protein: 7.2 g/dL (ref 6.5–8.1)

## 2018-05-22 LAB — PROCALCITONIN
Procalcitonin: 34.96 ng/mL
Procalcitonin: 33.24 ng/mL

## 2018-05-22 LAB — TROPONIN I: Troponin I: <0.03 ng/mL (ref <0.03)

## 2018-05-22 LAB — CBC
HCT: 32.5 % — ABNORMAL LOW (ref 36.0–46.0)
Hemoglobin: 9.8 g/dL — ABNORMAL LOW (ref 12.0–15.0)
MCH: 20 pg — ABNORMAL LOW (ref 26.0–34.0)
MCHC: 30.2 g/dL (ref 30.0–36.0)
MCV: 66.3 fL — ABNORMAL LOW (ref 80.0–100.0)
Platelets: 247 10*3/uL (ref 150–400)
RBC: 4.9 MIL/uL (ref 3.87–5.11)
RDW: 19.9 % — ABNORMAL HIGH (ref 11.5–15.5)
WBC: 16.9 10*3/uL — ABNORMAL HIGH (ref 4.0–10.5)
nRBC: 0 % (ref 0.0–0.2)

## 2018-05-22 LAB — URINALYSIS, ROUTINE W REFLEX MICROSCOPIC
Bilirubin Urine: NEGATIVE
Glucose, UA: NEGATIVE mg/dL
Ketones, ur: NEGATIVE mg/dL
Nitrite: POSITIVE — AB
Protein, ur: 100 mg/dL — AB
Specific Gravity, Urine: 1.013 (ref 1.005–1.030)
Squamous Epithelial / HPF: NONE SEEN (ref 0–5)
WBC, UA: >50 WBC/hpf — ABNORMAL HIGH (ref 0–5)
pH: 6 (ref 5.0–8.0)

## 2018-05-22 LAB — LACTATE DEHYDROGENASE: LDH: 325 U/L — ABNORMAL HIGH (ref 98–192)

## 2018-05-22 LAB — FIBRINOGEN: Fibrinogen: >750 mg/dL — ABNORMAL HIGH (ref 210–475)

## 2018-05-22 LAB — FERRITIN: Ferritin: 94 ng/mL (ref 11–307)

## 2018-05-22 LAB — FIBRIN DERIVATIVES D-DIMER (ARMC ONLY): Fibrin derivatives D-dimer (ARMC): 2541.71 ng/mL (FEU) — ABNORMAL HIGH (ref 0.00–499.00)

## 2018-05-22 LAB — POCT PREGNANCY, URINE: Preg Test, Ur: NEGATIVE

## 2018-05-22 LAB — LACTIC ACID, PLASMA
Lactic Acid, Venous: 3.5 mmol/L (ref 0.5–1.9)
Lactic Acid, Venous: 2.9 mmol/L (ref 0.5–1.9)

## 2018-05-22 LAB — TRIGLYCERIDES: Triglycerides: 155 mg/dL — ABNORMAL HIGH (ref <150)

## 2018-05-22 IMAGING — DX PORTABLE CHEST - 1 VIEW
1 series · 1 of 1 positions shown · non-contrast
Comparison: [DATE]

CLINICAL DATA: Short of breath and cough

EXAM:
PORTABLE CHEST 1 VIEW

[chest ap]
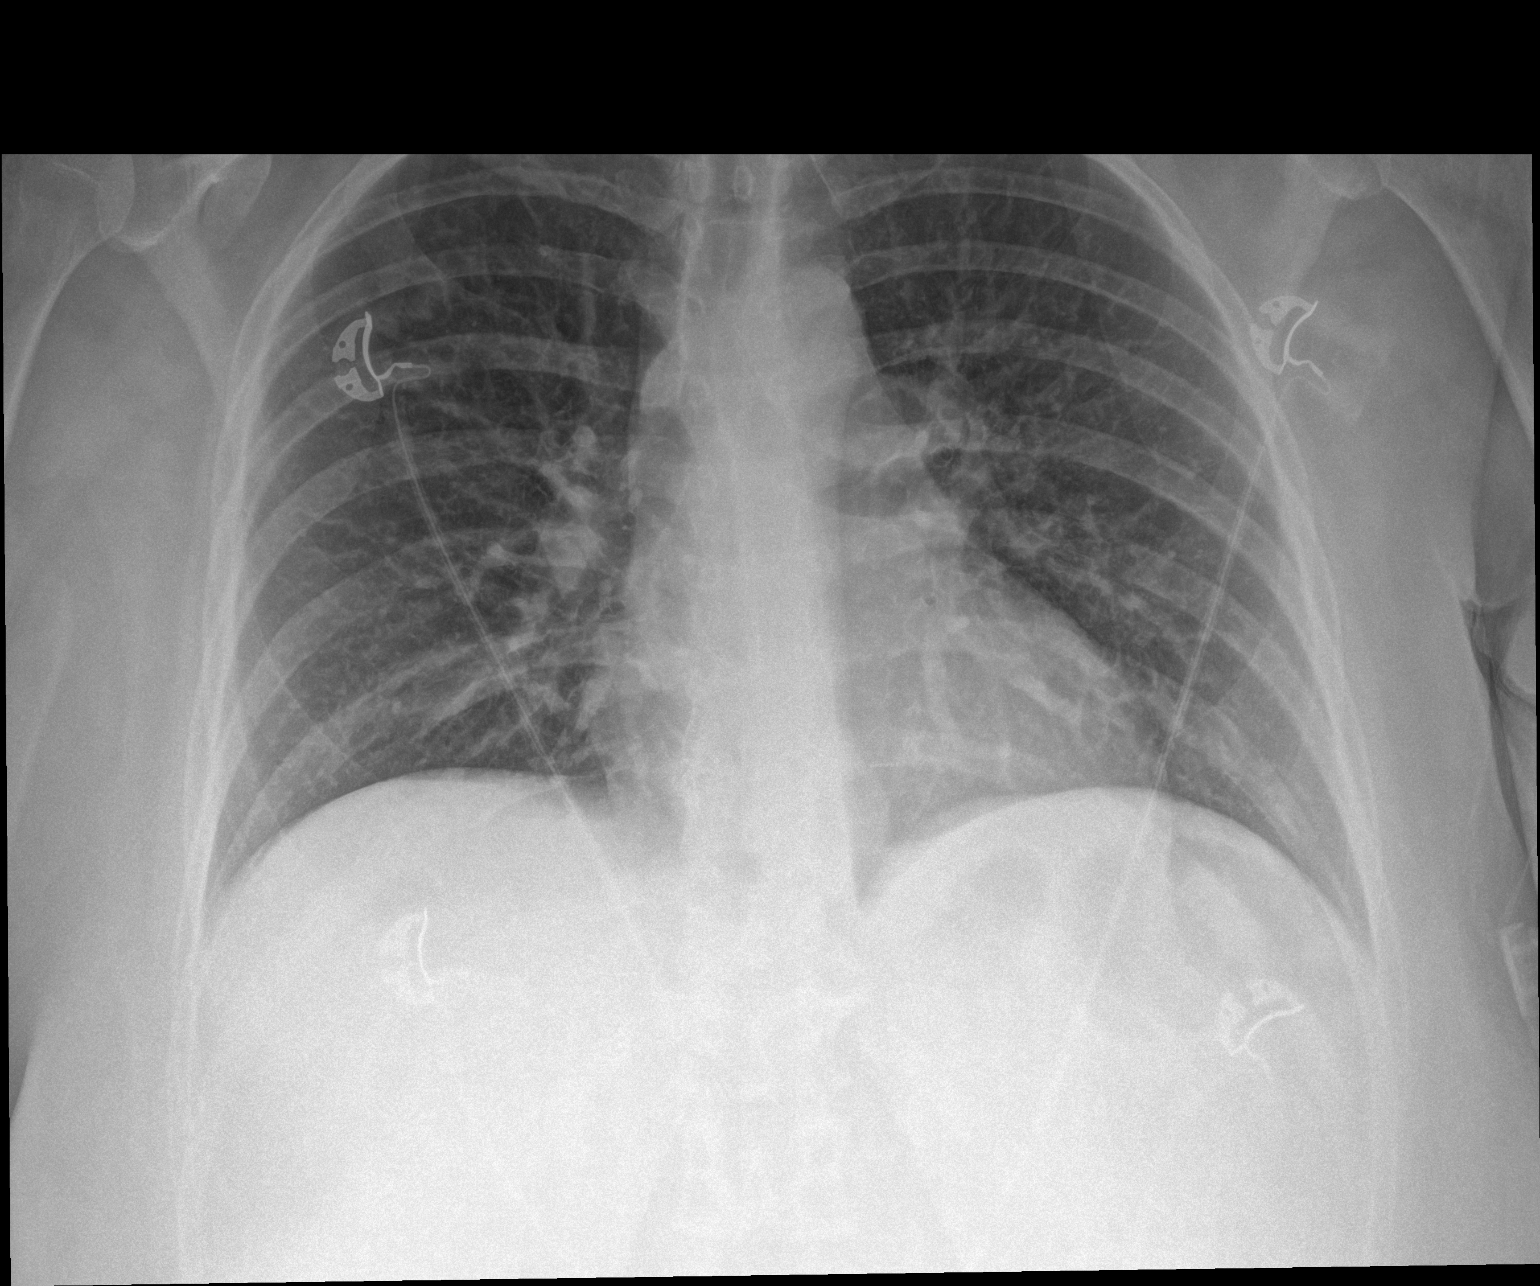

[1 of 1 positions shown; findings below may reference images not displayed]

FINDINGS: Normal heart size. Lungs clear. No pneumothorax. No pleural
effusion.
IMPRESSION: No active disease.

## 2018-05-22 MED ORDER — ACETAMINOPHEN 650 MG RE SUPP: 650.0000 mg | Freq: Four times a day (QID) | RECTAL | Status: DC | PRN

## 2018-05-22 MED ORDER — VANCOMYCIN HCL IN DEXTROSE 1-5 GM/200ML-% IV SOLN: 1000.0000 mg | INTRAVENOUS | Status: DC

## 2018-05-22 MED ORDER — SODIUM CHLORIDE 0.9 % IV SOLN
Freq: Once | INTRAVENOUS | Status: AC
  Administered 2018-05-23: via INTRAVENOUS

## 2018-05-22 MED ORDER — IPRATROPIUM-ALBUTEROL 0.5-2.5 (3) MG/3ML IN SOLN: 3.0000 mL | Freq: Four times a day (QID) | RESPIRATORY_TRACT | Status: DC | PRN

## 2018-05-22 MED ORDER — VANCOMYCIN HCL 10 G IV SOLR
1750.0000 mg | Freq: Once | INTRAVENOUS | Status: DC
  Filled 2018-05-22: qty 1750

## 2018-05-22 MED ORDER — SODIUM CHLORIDE 0.9 % IV SOLN
1000.0000 mL | Freq: Once | INTRAVENOUS | Status: AC
  Administered 2018-05-22: 1000 mL via INTRAVENOUS

## 2018-05-22 MED ORDER — SODIUM CHLORIDE 0.9 % IV SOLN
2.0000 g | INTRAVENOUS | Status: DC
  Administered 2018-05-22: 23:00:00 2 g via INTRAVENOUS
  Filled 2018-05-22: qty 2

## 2018-05-22 MED ORDER — NICOTINE 14 MG/24HR TD PT24
14.0000 mg | MEDICATED_PATCH | Freq: Every day | TRANSDERMAL | Status: DC
  Administered 2018-05-23 – 2018-05-25 (×3): 14 mg via TRANSDERMAL
  Filled 2018-05-22 (×3): qty 1

## 2018-05-22 MED ORDER — SODIUM CHLORIDE 0.9 % IV SOLN
1.0000 g | Freq: Once | INTRAVENOUS | Status: AC
  Administered 2018-05-22: 1 g via INTRAVENOUS
  Filled 2018-05-22: qty 10

## 2018-05-22 MED ORDER — SODIUM CHLORIDE 0.9 % IV SOLN
500.0000 mg | Freq: Once | INTRAVENOUS | Status: AC
  Administered 2018-05-22: 18:00:00 500 mg via INTRAVENOUS
  Filled 2018-05-22: qty 500

## 2018-05-22 MED ORDER — ONDANSETRON HCL 4 MG/2ML IJ SOLN
4.0000 mg | Freq: Four times a day (QID) | INTRAMUSCULAR | Status: DC | PRN
  Administered 2018-05-22: 4 mg via INTRAVENOUS
  Filled 2018-05-22: qty 2

## 2018-05-22 MED ORDER — ENOXAPARIN SODIUM 40 MG/0.4ML ~~LOC~~ SOLN
40.0000 mg | SUBCUTANEOUS | Status: DC
  Administered 2018-05-22: 23:00:00 40 mg via SUBCUTANEOUS
  Filled 2018-05-22: qty 0.4

## 2018-05-22 MED ORDER — POLYETHYLENE GLYCOL 3350 17 G PO PACK: 17.0000 g | PACK | Freq: Every day | ORAL | Status: DC | PRN

## 2018-05-22 MED ORDER — ACETAMINOPHEN 325 MG PO TABS
650.0000 mg | ORAL_TABLET | Freq: Four times a day (QID) | ORAL | Status: DC | PRN
  Administered 2018-05-22 – 2018-05-24 (×4): 650 mg via ORAL
  Filled 2018-05-22 (×4): qty 2

## 2018-05-22 MED ORDER — ONDANSETRON HCL 4 MG PO TABS
4.0000 mg | ORAL_TABLET | Freq: Four times a day (QID) | ORAL | Status: DC | PRN
  Administered 2018-05-23: 4 mg via ORAL
  Filled 2018-05-22: qty 1

## 2018-05-22 MED ORDER — SODIUM CHLORIDE 0.9 % IV SOLN
INTRAVENOUS | Status: DC
  Administered 2018-05-22 – 2018-05-25 (×8): via INTRAVENOUS

## 2018-05-22 MED ORDER — SODIUM CHLORIDE 0.9 % IV SOLN
1000.0000 mL | Freq: Once | INTRAVENOUS | Status: AC
  Administered 2018-05-22: 16:00:00 1000 mL via INTRAVENOUS

## 2018-05-22 NOTE — ED Notes (Signed)
ED TO INPATIENT HANDOFF REPORT  ED Nurse Name and Phone #:   S Name/Age/Gender Brenda [name] 48 y.o. female Room/Bed: ED11A/ED11A  Code Status   Code Status: Prior  Home/SNF/Other Home Patient oriented to: self, place, time and situation Is this baseline? Yes   Triage Complete: Triage complete  Chief Complaint SOB; Emesis  Triage Note Pt arrives to ED via POV c/o of SOB and cough that began last week. Was seen last week. Negative COVID. States feeling worse. Tachycardiac in triage. Breathing about 24 times a minute. Oxygen sats on RA 98%. Hx asthma. Able to talk in complete sentences. Appears anxious.    Allergies Allergies  Allergen Reactions  . Flexeril [Cyclobenzaprine] Other (See Comments)    RLS-like symptoms   . Seroquel [Quetiapine Fumarate] Other (See Comments)    RLS-like symptoms    Level of Care/Admitting Diagnosis ED Disposition    ED Disposition Condition Comment   Admit  Hospital Area: [facility] [[id number]]  Level of Care: Med-Surg [16]  Covid Evaluation: N/A  Diagnosis: Sepsis [facility]) [[id number]]  Admitting Physician: Willadean Carol DODD [[id number]]  Attending Physician: Willadean Carol DODD [[id number]]  Estimated length of stay: past midnight tomorrow  Certification:: I certify this patient will need inpatient services for at least 2 midnights  PT Class (Do Not Modify): Inpatient [101]  PT Acc Code (Do Not Modify): Private [1]       B Medical/Surgery History Past Medical History:  Diagnosis Date  . Depression   . Polysubstance abuse [facility])    Past Surgical History:  Procedure Laterality Date  . TUBAL LIGATION       A IV Location/Drains/Wounds Patient Lines/Drains/Airways Status   Active Line/Drains/Airways    Name:   Placement date:   Placement time:   Site:   Days:   Peripheral IV 05/22/18 Right Antecubital   05/22/18    1611    Antecubital   less than 1   Peripheral IV 05/22/18 Left;Posterior Forearm   05/22/18    1632     Forearm   less than 1          Intake/Output Last 24 hours  Intake/Output Summary (Last 24 hours) at 05/22/2018 1927 Last data filed at 05/22/2018 1749 Gross per 24 hour  Intake 1100 ml  Output -  Net 1100 ml    Labs/Imaging Results for orders placed or performed during the hospital encounter of 05/22/18 (from the past 48 hour(s))  Lactic acid, plasma     Status: Abnormal   Collection Time: 05/22/18  3:27 PM  Result Value Ref Range   Lactic Acid, Venous 3.5 (HH) 0.5 - 1.9 mmol/L    Comment: CRITICAL RESULT CALLED TO, READ BACK BY AND VERIFIED WITH Tila Millirons PEREZ AT 1711 05/22/2018.PMF Performed at [facility], [address] [place], [place] [address]   Comprehensive metabolic panel     Status: Abnormal   Collection Time: 05/22/18  3:27 PM  Result Value Ref Range   Sodium 135 135 - 145 mmol/L   Potassium 3.7 3.5 - 5.1 mmol/L   Chloride 97 (L) 98 - 111 mmol/L   CO2 20 (L) 22 - 32 mmol/L   Glucose, Bld 145 (H) 70 - 99 mg/dL   BUN 29 (H) 6 - 20 mg/dL   Creatinine, Ser 3.83 (H) 0.44 - 1.00 mg/dL   Calcium 8.1 (L) 8.9 - 10.3 mg/dL   Total Protein 7.2 6.5 - 8.1 g/dL   Albumin 3.0 (L) 3.5 - 5.0  g/dL   AST 53 (H) 15 - 41 U/L   ALT 58 (H) 0 - 44 U/L   Alkaline Phosphatase 102 38 - 126 U/L   Total Bilirubin 0.7 0.3 - 1.2 mg/dL   GFR calc non Af Amer 24 (L) >60 mL/min   GFR calc Af Amer 28 (L) >60 mL/min   Anion gap 18 (H) 5 - 15    Comment: Performed at [facility], [address] [place], [place] [address]  CBC WITH DIFFERENTIAL     Status: Abnormal   Collection Time: 05/22/18  3:27 PM  Result Value Ref Range   WBC 23.0 (H) 4.0 - 10.5 K/uL   RBC 5.38 (H) 3.87 - 5.11 MIL/uL   Hemoglobin 10.7 (L) 12.0 - 15.0 g/dL   HCT 19.1 47.8 - 29.5 %   MCV 67.3 (L) 80.0 - 100.0 fL   MCH 19.9 (L) 26.0 - 34.0 pg   MCHC 29.6 (L) 30.0 - 36.0 g/dL   RDW 62.1 (H) 30.8 - 65.7 %   Platelets 283 150 - 400 K/uL   nRBC 0.0 0.0 - 0.2 %   Neutrophils Relative % 82 %    Neutro Abs 18.9 (H) 1.7 - 7.7 K/uL   Lymphocytes Relative 11 %   Lymphs Abs 2.5 0.7 - 4.0 K/uL   Monocytes Relative 7 %   Monocytes Absolute 1.6 (H) 0.1 - 1.0 K/uL   Eosinophils Relative 0 %   Eosinophils Absolute 0.0 0.0 - 0.5 K/uL   Basophils Relative 0 %   Basophils Absolute 0.0 0.0 - 0.1 K/uL   WBC Morphology MORPHOLOGY UNREMARKABLE    RBC Morphology MORPHOLOGY UNREMARKABLE    Smear Review MORPHOLOGY UNREMARKABLE    Abs Immature Granulocytes 0.00 0.00 - 0.07 K/uL    Comment: Performed at [facility], [address] [place], [place] [address]  Procalcitonin     Status: None   Collection Time: 05/22/18  3:27 PM  Result Value Ref Range   Procalcitonin 34.96 ng/mL    Comment:        Interpretation: PCT >= 10 ng/mL: Important systemic inflammatory response, almost exclusively due to severe bacterial sepsis or septic shock. (NOTE)       Sepsis PCT Algorithm           Lower Respiratory Tract                                      Infection PCT Algorithm    ----------------------------     ----------------------------         PCT < 0.25 ng/mL                PCT < 0.10 ng/mL         Strongly encourage             Strongly discourage   discontinuation of antibiotics    initiation of antibiotics    ----------------------------     -----------------------------       PCT 0.25 - 0.50 ng/mL            PCT 0.10 - 0.25 ng/mL               OR       >80% decrease in PCT            Discourage initiation of  antibiotics      Encourage discontinuation           of antibiotics    ----------------------------     -----------------------------         PCT >= 0.50 ng/mL              PCT 0.26 - 0.50 ng/mL                AND       <80% decrease in PCT             Encourage initiation of                                             antibiotics       Encourage continuation           of antibiotics    ----------------------------      -----------------------------        PCT >= 0.50 ng/mL                  PCT > 0.50 ng/mL               AND         increase in PCT                  Strongly encourage                                      initiation of antibiotics    Strongly encourage escalation           of antibiotics                                     -----------------------------                                           PCT <= 0.25 ng/mL                                                 OR                                        > 80% decrease in PCT                                     Discontinue / Do not initiate                                             antibiotics Performed at [facility], [address] [place], [place] [address]   Fibrin derivatives D-Dimer     Status: Abnormal   Collection Time: 05/22/18  3:27 PM  Result Value Ref Range   Fibrin derivatives D-dimer ([facility]) 2,541.71 (H) 0.00 - 499.00 ng/mL (FEU)    Comment: (NOTE) <> Exclusion of Venous Thromboembolism (VTE) - OUTPATIENT ONLY   (Emergency Department or [place])   0-499 ng/ml (FEU): With a low to intermediate pretest probability                      for VTE this test result excludes the diagnosis                      of VTE.   >499 ng/ml (FEU) : VTE not excluded; additional work up for VTE is                      required. <> Testing on Inpatients and Evaluation of Disseminated Intravascular   Coagulation (DIC) Reference Range:   0-499 ng/ml (FEU) Performed at [facility], [address] [place], [place] [address]   Lactate dehydrogenase     Status: Abnormal   Collection Time: 05/22/18  3:27 PM  Result Value Ref Range   LDH 325 (H) 98 - 192 U/L    Comment: Performed at [facility], [address] [place], [place] [address]  Ferritin     Status: None   Collection Time: 05/22/18  3:27 PM  Result Value Ref Range   Ferritin 94 11 - 307 ng/mL    Comment: Performed at [facility], [address] [place], [place] [address]  Fibrinogen     Status: Abnormal   Collection Time: 05/22/18  3:27 PM  Result Value Ref Range   Fibrinogen >750 (H) 210 - 475 mg/dL    Comment: Performed at [facility], [address] [place], [place] [address]  Urinalysis, Routine w reflex microscopic     Status: Abnormal   Collection Time: 05/22/18  3:28 PM  Result Value Ref Range   Color, Urine AMBER (A) YELLOW    Comment: BIOCHEMICALS MAY BE AFFECTED BY COLOR   APPearance TURBID (A) CLEAR   Specific Gravity, Urine 1.013 1.005 - 1.030   pH 6.0 5.0 - 8.0   Glucose, UA NEGATIVE NEGATIVE mg/dL   Hgb urine dipstick SMALL (A) NEGATIVE   Bilirubin Urine NEGATIVE NEGATIVE   Ketones, ur NEGATIVE NEGATIVE mg/dL   Protein, ur 295 (A) NEGATIVE mg/dL   Nitrite POSITIVE (A) NEGATIVE   Leukocytes,Ua LARGE (A) NEGATIVE   RBC / HPF 21-50 0 - 5 RBC/hpf   WBC, UA >50 (H) 0 - 5 WBC/hpf   Bacteria, UA RARE (A) NONE SEEN   Squamous Epithelial / LPF NONE SEEN 0 - 5   WBC Clumps PRESENT    Non Squamous Epithelial PRESENT (A) NONE SEEN    Comment: Performed at [facility], [address] [place], [place] [address]  Brain natriuretic peptide - IF patient is dyspneic     Status: Abnormal   Collection Time: 05/22/18  3:28 PM  Result Value Ref Range   B Natriuretic Peptide 145.0 (H) 0.0 - 100.0 pg/mL    Comment: Performed at [facility], [address] [place], [place] [address]  SARS Coronavirus 2 [facility] order, Performed in [facility] hospital lab)     Status: None   Collection Time: 05/22/18  3:28 PM  Result Value Ref Range   SARS Coronavirus 2 NEGATIVE NEGATIVE    Comment: (NOTE) If result is NEGATIVE SARS-CoV-2 target nucleic  acids are NOT DETECTED. The SARS-CoV-2 RNA is generally detectable in upper and lower  respiratory specimens during the acute phase of infection. The lowest  concentration of SARS-CoV-2 viral copies this assay can detect is 250  copies / mL. A negative  result does not preclude SARS-CoV-2 infection  and should not be used as the sole basis for treatment or other  patient management decisions.  A negative result may occur with  improper specimen collection / handling, submission of specimen other  than nasopharyngeal swab, presence of viral mutation(s) within the  areas targeted by this assay, and inadequate number of viral copies  (<250 copies / mL). A negative result must be combined with clinical  observations, patient history, and epidemiological information. If result is POSITIVE SARS-CoV-2 target nucleic acids are DETECTED. The SARS-CoV-2 RNA is generally detectable in upper and lower  respiratory specimens dur ing the acute phase of infection.  Positive  results are indicative of active infection with SARS-CoV-2.  Clinical  correlation with patient history and other diagnostic information is  necessary to determine patient infection status.  Positive results do  not rule out bacterial infection or co-infection with other viruses. If result is PRESUMPTIVE POSTIVE SARS-CoV-2 nucleic acids MAY BE PRESENT.   A presumptive positive result was obtained on the submitted specimen  and confirmed on repeat testing.  While 2019 novel coronavirus  (SARS-CoV-2) nucleic acids may be present in the submitted sample  additional confirmatory testing may be necessary for epidemiological  and / or clinical management purposes  to differentiate between  SARS-CoV-2 and other Sarbecovirus currently known to infect humans.  If clinically indicated additional testing with an alternate test  methodology [phone]) is advised. The SARS-CoV-2 RNA is generally  detectable in upper and lower respiratory sp ecimens during the acute  phase of infection. The expected result is Negative. Fact Sheet for Patients:  BoilerBrush.com.cy Fact Sheet for Healthcare Providers: https://pope.com/ This test is not yet  approved or cleared by the [place] FDA and has been authorized for detection and/or diagnosis of SARS-CoV-2 by FDA under an Emergency Use Authorization (EUA).  This EUA will remain in effect (meaning this test can be used) for the duration of the COVID-19 declaration under Section 564(b)(1) of the Act, 21 U.S.C. section 360bbb-3(b)(1), unless the authorization is terminated or revoked sooner. Performed at [facility], [address] [place], [place] [address]   Triglycerides     Status: Abnormal   Collection Time: 05/22/18  3:28 PM  Result Value Ref Range   Triglycerides 155 (H) <150 mg/dL    Comment: Performed at [facility], [address] [place], [place] [address]  Lactic acid, plasma     Status: Abnormal   Collection Time: 05/22/18  6:35 PM  Result Value Ref Range   Lactic Acid, Venous 2.9 (HH) 0.5 - 1.9 mmol/L    Comment: CRITICAL RESULT CALLED TO, READ BACK BY AND VERIFIED WITH Lillyanna Glandon PEREZ AT 1922 05/22/2018.PMF Performed at [facility], [address] [place], [place] [address]   Basic metabolic panel     Status: Abnormal   Collection Time: 05/22/18  6:35 PM  Result Value Ref Range   Sodium 138 135 - 145 mmol/L   Potassium 4.6 3.5 - 5.1 mmol/L   Chloride 103 98 - 111 mmol/L   CO2 22 22 - 32 mmol/L   Glucose, Bld 116 (H) 70 - 99 mg/dL   BUN 31 (H) 6 - 20 mg/dL   Creatinine,  Ser 2.29 (H) 0.44 - 1.00 mg/dL   Calcium 7.3 (L) 8.9 - 10.3 mg/dL   GFR calc non Af Amer 25 (L) >60 mL/min   GFR calc Af Amer 29 (L) >60 mL/min   Anion gap 13 5 - 15    Comment: Performed at [facility], [address] [place], [place] [id number]  Pregnancy, urine POC     Status: None   Collection Time: 05/22/18  6:59 PM  Result Value Ref Range   Preg Test, Ur NEGATIVE NEGATIVE    Comment:        THE SENSITIVITY OF THIS METHODOLOGY IS >24 mIU/mL    Dg Chest Port 1 View  Result Date: 05/22/2018 CLINICAL DATA:  Short of breath and cough  EXAM: PORTABLE CHEST 1 VIEW COMPARISON:  04/30/2018 FINDINGS: Normal heart size. Lungs clear. No pneumothorax. No pleural effusion. IMPRESSION: No active disease. Electronically Signed   By: Jolaine ClickArthur  Hoss M.D.   On: 05/22/2018 16:20    Pending Labs Unresulted Labs (From admission, onward)    Start     Ordered   05/22/18 1520  C-reactive protein  Once,   STAT     05/22/18 1519   05/22/18 1518  Blood Culture (routine x 2)  BLOOD CULTURE X 2,   STAT     05/22/18 1518   05/22/18 1518  Urine culture  ONCE - STAT,   STAT     05/22/18 1518   Signed and Held  HIV antibody (Routine Testing)  Once,   R     Signed and Held   Signed and Held  Basic metabolic panel  Tomorrow morning,   R     Signed and Held   Signed and Held  CBC  Tomorrow morning,   R     Signed and Held   Signed and Held  Procalcitonin  Tomorrow morning,   R     Signed and Held   Signed and Held  Expectorated sputum assessment w rflx to resp cult  Once,   R    Question:  Patient immune status  Answer:  Normal   Signed and Held   Signed and Held  Hepatitis panel, acute  Once,   R     Signed and Held          Vitals/Pain Today's Vitals   05/22/18 1800 05/22/18 1815 05/22/18 1830 05/22/18 1901  BP: 113/62  (!) 132/91   Pulse: (!) 119 (!) 120 (!) 120   Resp: (!) 23  (!) 27   Temp:    (!) 100.9 F (38.3 C)  TempSrc:    Oral  SpO2: 100% 99% 98%   Weight:      Height:      PainSc:        Isolation Precautions No active isolations  Medications Medications  vancomycin (VANCOCIN) 1,750 mg in sodium chloride 0.9 % 500 mL IVPB (has no administration in time range)  ceFEPIme (MAXIPIME) 2 g in sodium chloride 0.9 % 100 mL IVPB (has no administration in time range)  vancomycin (VANCOCIN) IVPB 1000 mg/200 mL premix (has no administration in time range)  0.9 %  sodium chloride infusion (0 mLs Intravenous Stopped 05/22/18 1739)  cefTRIAXone (ROCEPHIN) 1 g in sodium chloride 0.9 % 100 mL IVPB (0 g Intravenous Stopped 05/22/18  1749)  azithromycin (ZITHROMAX) 500 mg in sodium chloride 0.9 % 250 mL IVPB (500 mg Intravenous New Bag/Given 05/22/18 1749)  0.9 %  sodium chloride infusion (1,000  mLs Intravenous New Bag/Given 05/22/18 1740)    Mobility walks Moderate fall risk   Focused Assessments sepsis    R Recommendations: See Admitting Provider Note  Report given to:   Additional Notes:

## 2018-05-22 NOTE — ED Triage Notes (Signed)
Pt arrives to ED via POV c/o of SOB and cough that began last week. Was seen last week. Negative COVID. States feeling worse. Tachycardiac in triage. Breathing about 24 times a minute. Oxygen sats on RA 98%. Hx asthma. Able to talk in complete sentences. Appears anxious.

## 2018-05-22 NOTE — Progress Notes (Signed)
CODE SEPSIS - PHARMACY COMMUNICATION  **Broad Spectrum Antibiotics should be administered within 1 hour of Sepsis diagnosis**  Time Code Sepsis Called/Page Received: 1732  Antibiotics Ordered: Azithromycin/Ceftriaxone  Time of 1st antibiotic administration: 1719  Additional action taken by pharmacy: None  If necessary, Name of Provider/Nurse Contacted: None  Albina Billet, PharmD, BCPS Clinical Pharmacist 05/22/2018 5:59 PM

## 2018-05-22 NOTE — ED Notes (Signed)
Patient feeling hot to touch oral temp obtained. 100.9.

## 2018-05-22 NOTE — H&P (Addendum)
[facility] [place]   PATIENT NAME: Brenda [name]    MR#:  [id number]  DATE OF BIRTH:  1970-12-18  DATE OF ADMISSION:  05/22/2018  PRIMARY CARE PHYSICIAN: System, Pcp Not In   REQUESTING/REFERRING PHYSICIAN: Jene Every, MD  CHIEF COMPLAINT:   Chief Complaint  Patient presents with  . Shortness of Breath    HISTORY OF PRESENT ILLNESS:  Brenda [name]  is a 48 y.o. female with a known history of depression who presented to the ED with shortness of breath.  She was recently hospitalized from 4/3-4/5 with sepsis secondary to community-acquired pneumonia.  She was treated with IV ceftriaxone and azithromycin and was then transitioned to Keflex and doxycycline on discharge.  She was also given prednisone for a mild asthma exacerbation.  COVID testing from that hospitalization was negative.  Patient states that she took her antibiotics and prednisone as prescribed, but her shortness of breath never improved.  Her shortness of breath then started to worsen about 1 week ago.  She denies fevers or chills.  She endorses cough productive of yellow and green sputum.  She denies any chest pain or lower extremity edema.  She states she has not been eating and drinking well and has been spending most of her time in bed.  In the ED, she was meeting sepsis criteria with tachycardia to the 120s, tachypnea to the mid 20s, and leukocytosis.  Labs were significant for creatinine 2.31, AST 53, ALT 58, BNP 145, lactic acid 3.5, procalcitonin 35, WBC 23, hemoglobin 10.7.  Repeat COVID test was negative.  D-dimer elevated. Chest x-ray was negative.  She was given a dose of ceftriaxone and azithromycin. Hospitalists were called for admission.   PAST MEDICAL HISTORY:   Past Medical History:  Diagnosis Date  . Depression   . Polysubstance abuse (HCC)     PAST SURGICAL HISTORY:   Past Surgical History:  Procedure Laterality Date  . TUBAL LIGATION      SOCIAL  HISTORY:   Social History   Tobacco Use  . Smoking status: Current Every Day Smoker    Packs/day: 1.00    Years: 30.00    Pack years: 30.00    Types: Cigarettes  . Smokeless tobacco: Never Used  Substance Use Topics  . Alcohol use: No    FAMILY HISTORY:  History reviewed. No pertinent family history.  DRUG ALLERGIES:   Allergies  Allergen Reactions  . Flexeril [Cyclobenzaprine] Other (See Comments)    RLS-like symptoms   . Seroquel [Quetiapine Fumarate] Other (See Comments)    RLS-like symptoms    REVIEW OF SYSTEMS:   Review of Systems  Constitutional: Negative for chills and fever.  HENT: Negative for congestion and sore throat.   Eyes: Negative for blurred vision and double vision.  Respiratory: Positive for cough, sputum production and shortness of breath.   Cardiovascular: Negative for chest pain, palpitations and leg swelling.  Gastrointestinal: Negative for nausea and vomiting.  Genitourinary: Negative for dysuria and urgency.  Musculoskeletal: Negative for back pain and neck pain.  Neurological: Negative for dizziness and headaches.  Psychiatric/Behavioral: Negative for depression. The patient is not nervous/anxious.     MEDICATIONS AT HOME:   Prior to Admission medications   Medication Sig Start Date End Date Taking? Authorizing Provider  albuterol (PROVENTIL HFA;VENTOLIN HFA) 108 (90 Base) MCG/ACT inhaler Inhale 2 puffs into the lungs every 4 (four) hours as needed for wheezing or shortness of breath. 04/30/18   Dionne Bucy,  MD  cephALEXin (KEFLEX) 500 MG capsule Take 1 capsule (500 mg total) by mouth every 12 (twelve) hours. 05/02/18   Enedina FinnerPatel, Sona, MD  doxycycline (VIBRA-TABS) 100 MG tablet Take 1 tablet (100 mg total) by mouth every 12 (twelve) hours. 05/02/18   Enedina FinnerPatel, Sona, MD  guaiFENesin-dextromethorphan [facility](ROBITUSSIN DM) 100-10 MG/5ML syrup Take 5 mLs by mouth every 4 (four) hours as needed for cough. 05/02/18   Enedina FinnerPatel, Sona, MD      VITAL SIGNS:   Blood pressure 104/78, pulse (!) 120, temperature 98.9 F (37.2 C), temperature source Oral, resp. rate (!) 22, height 5\' 2"  (1.575 m), weight 86.2 kg, SpO2 99 %.  PHYSICAL EXAMINATION:  Physical Exam  GENERAL:  48 y.o.-year-old patient lying in the bed with no acute distress.  EYES: Pupils equal, round, reactive to light and accommodation. No scleral icterus. Extraocular muscles intact.  HEENT: Head atraumatic, normocephalic. Oropharynx and nasopharynx clear.  NECK:  Supple, no jugular venous distention. No thyroid enlargement, no tenderness.  LUNGS: Normal breath sounds bilaterally, no wheezing, rales,rhonchi or crepitation. No use of accessory muscles of respiration. + Nasal cannula in place. CARDIOVASCULAR: Tachycardic, regular rhythm, S1, S2 normal. No murmurs, rubs, or gallops.  ABDOMEN: Soft, nontender, nondistended. Bowel sounds present. No organomegaly or mass.  EXTREMITIES: No pedal edema, cyanosis, or clubbing.  NEUROLOGIC: Cranial nerves II through XII are intact. Muscle strength 5/5 in all extremities. Sensation intact. Gait not checked.  PSYCHIATRIC: The patient is alert and oriented x 3.  SKIN: No obvious rash, lesion, or ulcer.   LABORATORY PANEL:   CBC Recent Labs  Lab 05/22/18 1527  WBC 23.0*  HGB 10.7*  HCT 36.2  PLT 283   ------------------------------------------------------------------------------------------------------------------  Chemistries  Recent Labs  Lab 05/22/18 1527  NA 135  K 3.7  CL 97*  CO2 20*  GLUCOSE 145*  BUN 29*  CREATININE 2.34*  CALCIUM 8.1*  AST 53*  ALT 58*  ALKPHOS 102  BILITOT 0.7   ------------------------------------------------------------------------------------------------------------------  Cardiac Enzymes No results for input(s): TROPONINI in the last 168 hours. ------------------------------------------------------------------------------------------------------------------  RADIOLOGY:  Dg Chest Port 1 View   Result Date: 05/22/2018 CLINICAL DATA:  Short of breath and cough EXAM: PORTABLE CHEST 1 VIEW COMPARISON:  04/30/2018 FINDINGS: Normal heart size. Lungs clear. No pneumothorax. No pleural effusion. IMPRESSION: No active disease. Electronically Signed   By: Jolaine ClickArthur  Hoss M.D.   On: 05/22/2018 16:20      IMPRESSION AND PLAN:   Sepsis- secondary to HCAP.  Meeting sepsis criteria on admission with leukocytosis, tachycardia, and tachypnea.  Lactic acid and procalcitonin also elevated.  Chest x-ray negative. -COVID testing negative -Vancomycin and zosyn -Recheck procalcitonin in the morning -Trend lactic acid -UA pending -Follow-up blood and urine cultures  Acute hypoxic respiratory failure- requiring 3 L O2 in the ED. Likely due to HCAP, however with tachycardia, elevated d-dimer, and patient spending a lot of time in bed, need to consider PE. -Unable to order CTA chest due to AKI.  Will recheck creatinine now that patient has had a 2 L bolus, and order CTA chest if GFR > 31. -Otherwise, need to consider V/Q scan -Repeat 2 view CXR in the morning -Wean O2 as able  AKI- due to decreased p.o. intake and sepsis -Continue fluids -Avoid nephrotoxic agents as able  Elevated liver transaminases- likely due to sepsis -Check hepatitis panel  Microcytic anemia- Hgb 10.7, ferritin normal. -Monitor  Tobacco abuse- smokes 1 pack/day -Nicotine patch as needed   All the records are  reviewed and case discussed with ED provider. Management plans discussed with the patient, family and they are in agreement.  CODE STATUS: Full  TOTAL TIME TAKING CARE OF THIS PATIENT: 45 minutes.    Brenda [name] M.D on 05/22/2018 at 5:38 PM  Between 7am to 6pm - Pager - [phone]  After 6pm go to [email or web address] - Social research officer, government  [employer] [facility]  Hospitalists  Office  [phone]  CC: Primary care physician; System, Pcp Not In   Note: This dictation was prepared with Dragon dictation  along with smaller phrase technology. Any transcriptional errors that result from this process are unintentional.

## 2018-05-22 NOTE — Consult Note (Signed)
Pharmacy Antibiotic Note  Brenda [name] is a 48 y.o. female admitted on 05/22/2018 with Sepsis/PNA.  Pharmacy has been consulted for Vancomycin/Cefepime dosing.  Plan: Will start Cefepime 2g IV q12h  Pt will receive 1750mg  IV vancomycin loading dose in ED, followed by:  Vancomycin 1000 mg IV Q 48 hrs. Goal AUC 400-550. Expected AUC: 481 SCr used: 2.34    Height: 5\' 2"  (157.5 cm) Weight: 190 lb (86.2 kg) IBW/kg (Calculated) : 50.1  Temp (24hrs), Avg:98.9 F (37.2 C), Min:98.9 F (37.2 C), Max:98.9 F (37.2 C)  Recent Labs  Lab 05/22/18 1527  WBC 23.0*  CREATININE 2.34*  LATICACIDVEN 3.5*    Estimated Creatinine Clearance: 30.6 mL/min (A) (by C-G formula based on SCr of 2.34 mg/dL (H)).    Allergies  Allergen Reactions  . Flexeril [Cyclobenzaprine] Other (See Comments)    RLS-like symptoms   . Seroquel [Quetiapine Fumarate] Other (See Comments)    RLS-like symptoms    Antimicrobials this admission: 4/25 CTX and Azithromycin x 1 4/25 Vancomycin >> 4/25 Cefepime >>  Dose adjustments this admission: None  Microbiology results: 4/25 BCx: pending 4/25 UCx: pending   4/25 COVID test NEG  Thank you for allowing pharmacy to be a part of this patient's care.  Albina Billet, PharmD, BCPS Clinical Pharmacist 05/22/2018 6:41 PM

## 2018-05-22 NOTE — ED Notes (Signed)
When pt got up from wheelchair to ED stretcher respirations increased from 26 to 34. Oxygen level remained the same.

## 2018-05-22 NOTE — ED Provider Notes (Addendum)
[facility] Emergency Department Provider Note   ____________________________________________    I have reviewed the triage vital signs and the nursing notes.   HISTORY  Chief Complaint Shortness of Breath     HPI Brenda [name] is a 48 y.o. female who presents with complaints of shortness of breath.  Patient reports she was discharged in the hospital earlier in the month with a diagnosis of pneumonia, she reports completing antibiotics as an outpatient.  At that time she had negative coronavirus testing.  She reports she has been staying home but reports over the last several days shortness of breath is gotten much worse.  Positive cough.  Some myalgias.  Is not sure if she has had fevers.  Has not take anything for this.  Past Medical History:  Diagnosis Date   Depression    Polysubstance abuse [facility])     Patient Active Problem List   Diagnosis Date Noted   Sepsis (HCC) 04/30/2018   Opioid use disorder, moderate, dependence (HCC) 05/02/2016   Tobacco use disorder 05/02/2016   Severe recurrent major depression without psychotic features (HCC) 05/01/2016   Cocaine use disorder, moderate, dependence (HCC) 05/01/2016   Alcohol abuse 05/01/2016   GERD (gastroesophageal reflux disease) 05/01/2016    Past Surgical History:  Procedure Laterality Date   TUBAL LIGATION      Prior to Admission medications   Medication Sig Start Date End Date Taking? Authorizing Provider  albuterol (PROVENTIL HFA;VENTOLIN HFA) 108 (90 Base) MCG/ACT inhaler Inhale 2 puffs into the lungs every 4 (four) hours as needed for wheezing or shortness of breath. 04/30/18   Dionne Bucy, MD  cephALEXin (KEFLEX) 500 MG capsule Take 1 capsule (500 mg total) by mouth every 12 (twelve) hours. 05/02/18   Enedina Finner, MD  doxycycline (VIBRA-TABS) 100 MG tablet Take 1 tablet (100 mg total) by mouth every 12 (twelve) hours. 05/02/18   Enedina Finner, MD    guaiFENesin-dextromethorphan [facility] DM) 100-10 MG/5ML syrup Take 5 mLs by mouth every 4 (four) hours as needed for cough. 05/02/18   Enedina Finner, MD     Allergies Flexeril [cyclobenzaprine] and Seroquel [quetiapine fumarate]  History reviewed. No pertinent family history.  Social History Social History   Tobacco Use   Smoking status: Current Every Day Smoker    Packs/day: 1.00    Years: 30.00    Pack years: 30.00    Types: Cigarettes   Smokeless tobacco: Never Used  Substance Use Topics   Alcohol use: No   Drug use: Yes    Frequency: 7.0 times per week    Types: Cocaine    Comment: Pt also states sometimes Heroin    Review of Systems  Constitutional: As above Eyes: No visual changes.  ENT: No sore throat. Cardiovascular: Denies chest pain. Respiratory: As above Gastrointestinal: No abdominal pain.  Positive nausea Genitourinary: Negative for dysuria. Musculoskeletal: Negative for back pain. Skin: Negative for rash. Neurological: Negative for headaches   ____________________________________________   PHYSICAL EXAM:  VITAL SIGNS: ED Triage Vitals  Enc Vitals Group     BP 05/22/18 1513 114/74     Pulse Rate 05/22/18 1513 (!) 150     Resp 05/22/18 1513 (!) 26     Temp 05/22/18 1513 98.9 F (37.2 C)     Temp Source 05/22/18 1513 Oral     SpO2 05/22/18 1513 97 %     Weight 05/22/18 1514 86.2 kg (190 lb)     Height 05/22/18 1514 1.575  m (5\' 2" )     Head Circumference --      Peak Flow --      Pain Score 05/22/18 1514 8     Pain Loc --      Pain Edu? --      Excl. in GC? --     Constitutional: Alert and oriented.  Eyes: Conjunctivae are normal.  . Nose: No congestion/rhinnorhea. Mouth/Throat: Mucous membranes are moist.    Cardiovascular: Significant tachycardia, regular rhythm. Grossly normal heart sounds.  Good peripheral circulation. Respiratory: Normal respiratory effort.  No retractions. scattered mild wheezes.  Gastrointestinal: Soft and  nontender. No distention.    Musculoskeletal: No lower extremity tenderness nor edema.  Warm and well perfused Neurologic:  Normal speech and language. No gross focal neurologic deficits are appreciated.  Skin:  Skin is warm, dry and intact. No rash noted. Psychiatric: Mood and affect are normal. Speech and behavior are normal.  ____________________________________________   LABS (all labs ordered are listed, but only abnormal results are displayed)  Labs Reviewed  LACTIC ACID, PLASMA - Abnormal; Notable for the following components:      Result Value   Lactic Acid, Venous 3.5 (*)    All other components within normal limits  COMPREHENSIVE METABOLIC PANEL - Abnormal; Notable for the following components:   Chloride 97 (*)    CO2 20 (*)    Glucose, Bld 145 (*)    BUN 29 (*)    Creatinine, Ser 2.34 (*)    Calcium 8.1 (*)    Albumin 3.0 (*)    AST 53 (*)    ALT 58 (*)    GFR calc non Af Amer 24 (*)    GFR calc Af Amer 28 (*)    Anion gap 18 (*)    All other components within normal limits  CBC WITH DIFFERENTIAL/PLATELET - Abnormal; Notable for the following components:   WBC 23.0 (*)    RBC 5.38 (*)    Hemoglobin 10.7 (*)    MCV 67.3 (*)    MCH 19.9 (*)    MCHC 29.6 (*)    RDW 20.5 (*)    All other components within normal limits  BRAIN NATRIURETIC PEPTIDE - Abnormal; Notable for the following components:   B Natriuretic Peptide 145.0 (*)    All other components within normal limits  LACTATE DEHYDROGENASE - Abnormal; Notable for the following components:   LDH 325 (*)    All other components within normal limits  TRIGLYCERIDES - Abnormal; Notable for the following components:   Triglycerides 155 (*)    All other components within normal limits  FIBRINOGEN - Abnormal; Notable for the following components:   Fibrinogen >750 (*)    All other components within normal limits  CULTURE, BLOOD (ROUTINE X 2)  CULTURE, BLOOD (ROUTINE X 2)  URINE CULTURE  SARS CORONAVIRUS 2  (HOSPITAL ORDER, PERFORMED IN [facility])  PROCALCITONIN  FERRITIN  LACTIC ACID, PLASMA  URINALYSIS, ROUTINE W REFLEX MICROSCOPIC  FIBRIN DERIVATIVES D-DIMER ([facility] ONLY)  C-REACTIVE PROTEIN  POC URINE PREG, ED   ____________________________________________  EKG  ED ECG REPORT I, Jene Every, the attending physician, personally viewed and interpreted this ECG.  Date: 05/22/2018  Rhythm: Sinus tachycardia QRS Axis: normal Intervals: normal ST/T Wave abnormalities: Nonspecific changes Narrative Interpretation: no evidence of acute ischemia  ____________________________________________  RADIOLOGY  X-ray ____________________________________________   PROCEDURES  Procedure(s) performed: No  Procedures   Critical Care performed: yes  CRITICAL CARE Performed by:  Jene Everyobert Shanora Christensen   Total critical care time:8730minutes  Critical care time was exclusive of separately billable procedures and treating other patients.  Critical care was necessary to treat or prevent imminent or life-threatening deterioration.  Critical care was time spent personally by me on the following activities: development of treatment plan with patient and/or surrogate as well as nursing, discussions with consultants, evaluation of patient's response to treatment, examination of patient, obtaining history from patient or surrogate, ordering and performing treatments and interventions, ordering and review of laboratory studies, ordering and review of radiographic studies, pulse oximetry and re-evaluation of patient's condition.  ____________________________________________   INITIAL IMPRESSION / ASSESSMENT AND PLAN / ED COURSE  Pertinent labs & imaging results that were available during my care of the patient were reviewed by me and considered in my medical decision making (see chart for details).  Patient with a history of asthma presents with shortness of breath.  Review medical  records demonstrates hospitalization for 2 days earlier in the month diagnosed with pneumonia at that time.  COVID swab was negative.  Differential today includes recurrent pneumonia, asthma exacerbation although no significant wheezing, COVID-19.  We will obtain chest x-ray, labs, give IV fluids given her significant tachycardia with mild tachypnea  Chest x-ray overall reassuring however patient has marked abnormalities on labs including elevated lactic acid, elevated BUN and creatinine and white blood cell count of 23,000 most consistent with sepsis, IV Rocephin and azithromycin ordered. procalcitonin suspicious for bacterial illness, coronavirus test is still pending.  IV fluids ordered blood pressure is stable.  Heart rate is improving.  Discussed with hospitalist for admission  Brenda RanaJennifer C [name] was evaluated in Emergency Department on 05/22/2018 for the symptoms described in the history of present illness. She was evaluated in the context of the global COVID-19 pandemic, which necessitated consideration that the patient might be at risk for infection with the SARS-CoV-2 virus that causes COVID-19. Institutional protocols and algorithms that pertain to the evaluation of patients at risk for COVID-19 are in a state of rapid change based on information released by regulatory bodies including the CDC and federal and state organizations. These policies and algorithms were followed during the patient's care in the ED.       ____________________________________________   FINAL CLINICAL IMPRESSION(S) / ED DIAGNOSES  Final diagnoses:  Severe sepsis (HCC)  Community acquired pneumonia, unspecified laterality        Note:  This document was prepared using Conservation officer, historic buildingsDragon voice recognition software and may include unintentional dictation errors.   Jene EveryKinner, Phillp Dolores, MD 05/22/18 1740    Jene EveryKinner, Maliha Outten, MD 05/22/18 1740

## 2018-05-23 ENCOUNTER — Inpatient Hospital Stay: Payer: Self-pay

## 2018-05-23 LAB — BLOOD CULTURE ID PANEL (REFLEXED)

## 2018-05-23 LAB — IRON AND TIBC
Iron: 5 ug/dL — ABNORMAL LOW (ref 28–170)
Saturation Ratios: 2 % — ABNORMAL LOW (ref 10.4–31.8)
TIBC: 293 ug/dL (ref 250–450)
UIBC: 288 ug/dL

## 2018-05-23 LAB — BASIC METABOLIC PANEL
Anion gap: 9 (ref 5–15)
BUN: 38 mg/dL — ABNORMAL HIGH (ref 6–20)
CO2: 19 mmol/L — ABNORMAL LOW (ref 22–32)
Calcium: 6.8 mg/dL — ABNORMAL LOW (ref 8.9–10.3)
Chloride: 107 mmol/L (ref 98–111)
Creatinine, Ser: 2.61 mg/dL — ABNORMAL HIGH (ref 0.44–1.00)
GFR calc Af Amer: 25 mL/min — ABNORMAL LOW (ref >60)
GFR calc non Af Amer: 21 mL/min — ABNORMAL LOW (ref >60)
Glucose, Bld: 91 mg/dL (ref 70–99)
Potassium: 3.6 mmol/L (ref 3.5–5.1)
Sodium: 135 mmol/L (ref 135–145)

## 2018-05-23 LAB — C-REACTIVE PROTEIN: CRP: 36 mg/dL — ABNORMAL HIGH (ref <1.0)

## 2018-05-23 LAB — TROPONIN I
Troponin I: <0.03 ng/mL (ref <0.03)
Troponin I: <0.03 ng/mL (ref <0.03)

## 2018-05-23 LAB — LACTIC ACID, PLASMA: Lactic Acid, Venous: 1.3 mmol/L (ref 0.5–1.9)

## 2018-05-23 IMAGING — US US RENAL
1 series · 14 of 25 positions shown · non-contrast
Comparison: None.

CLINICAL DATA: UTI

EXAM:
RENAL / URINARY TRACT ULTRASOUND COMPLETE

[Series 1: us renal · 14 of 35 slices shown]
[im 1/35]
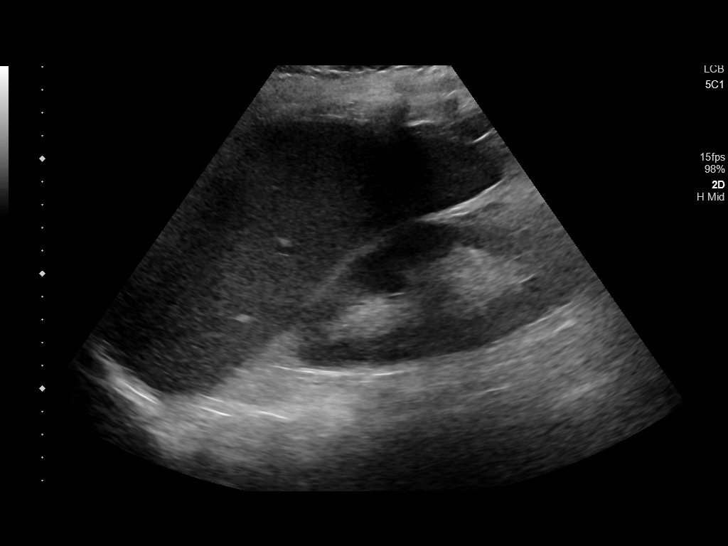
[im 3/35]
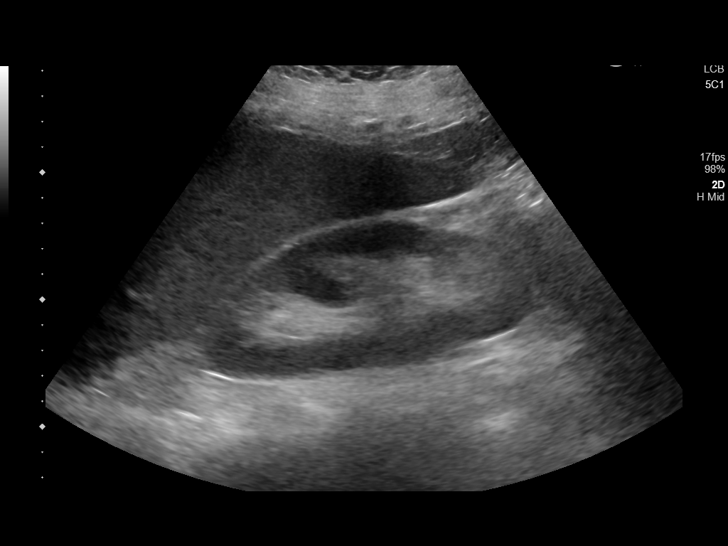
[im 6/35]
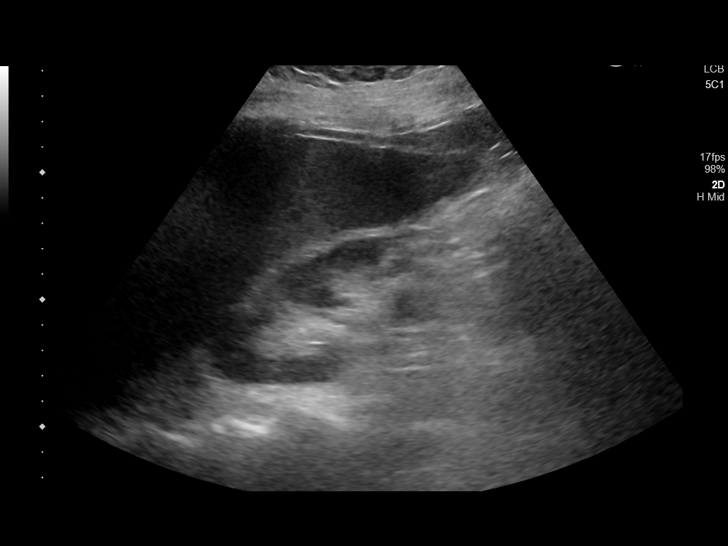
[im 9/35]
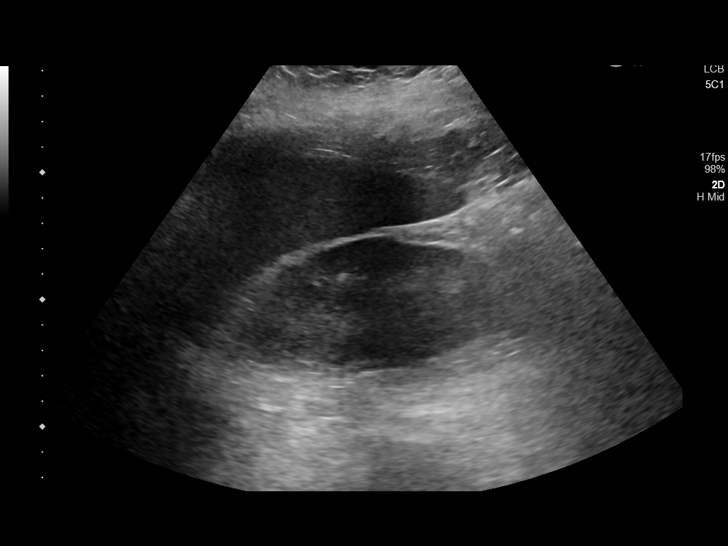
[im 12/35]
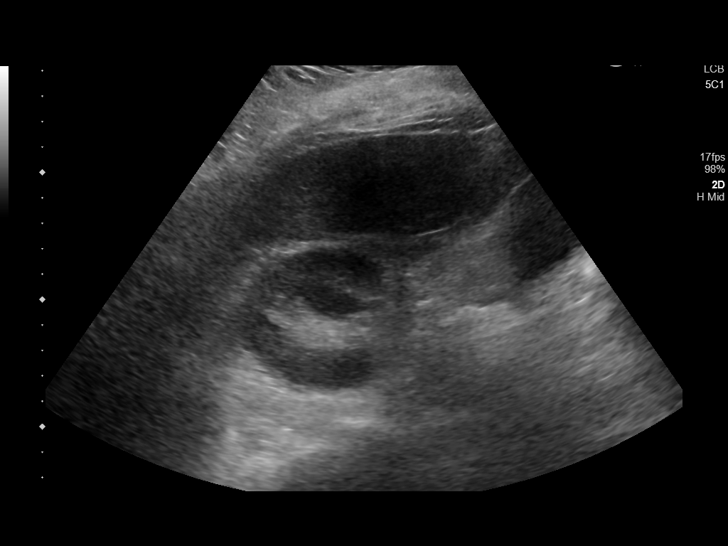
[im 13/35]
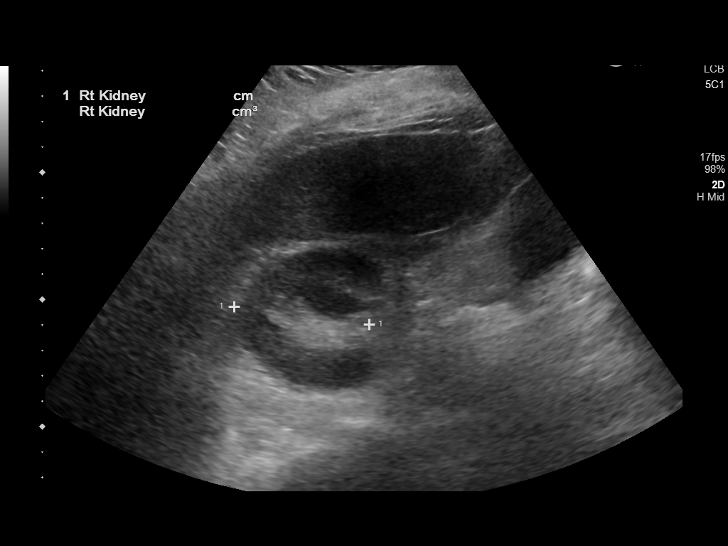
[im 16/35]
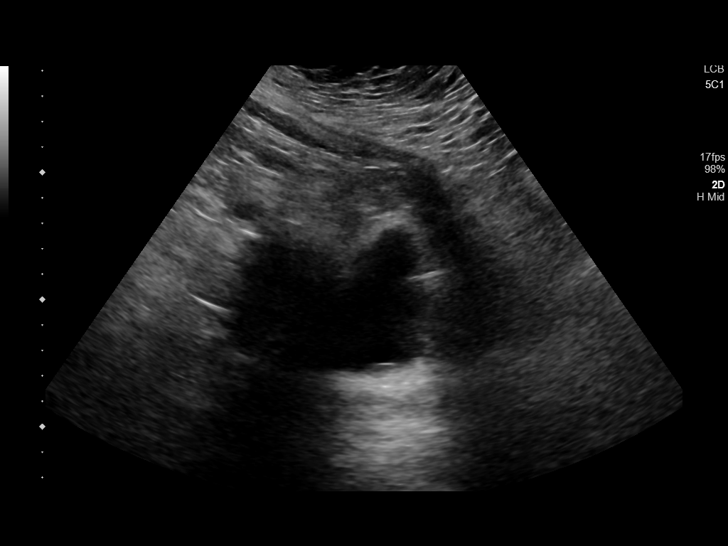
[im 19/35]
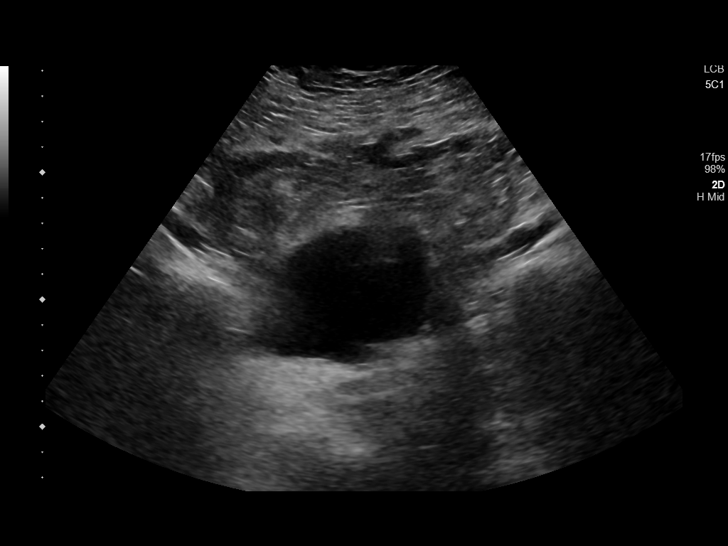
[im 22/35]
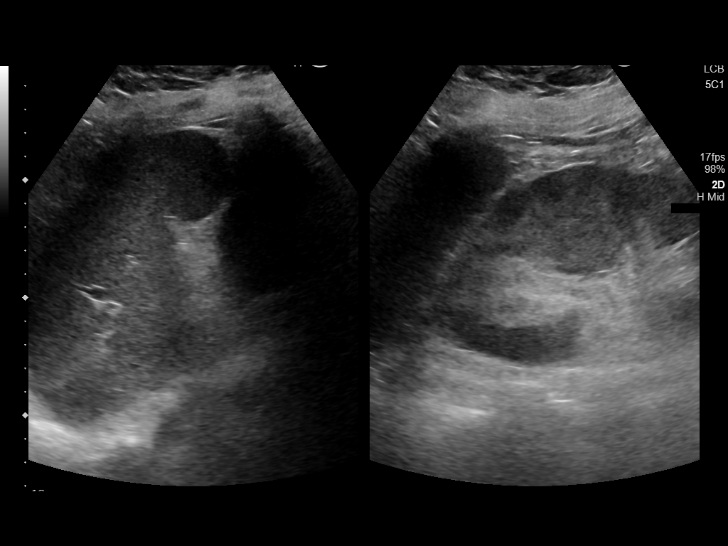
[im 23/35]
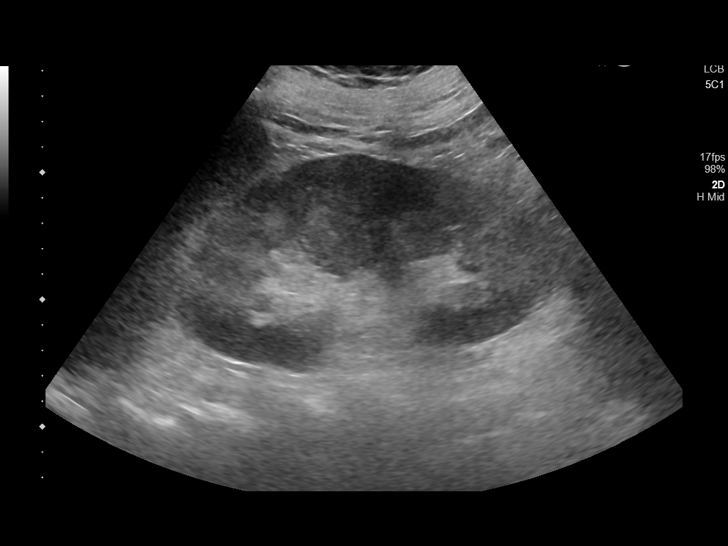
[im 26/35]
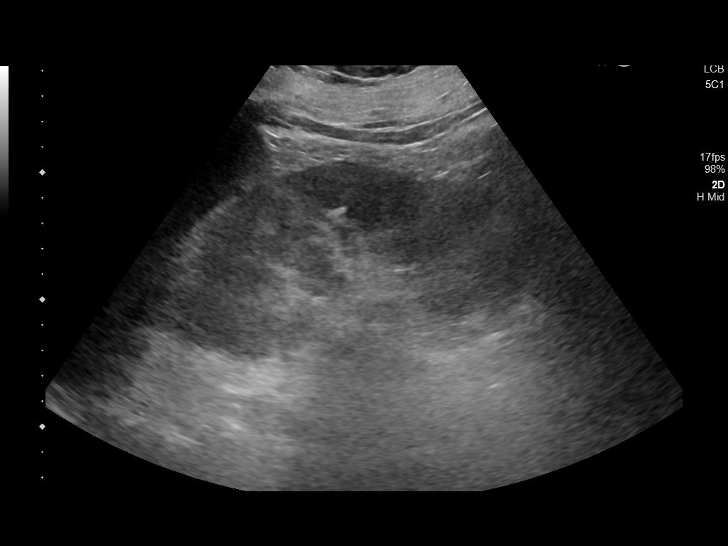
[im 29/35]
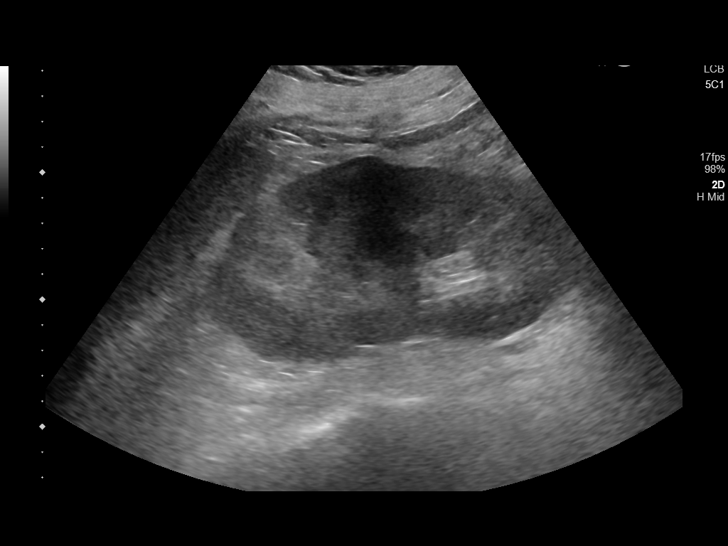
[im 32/35]
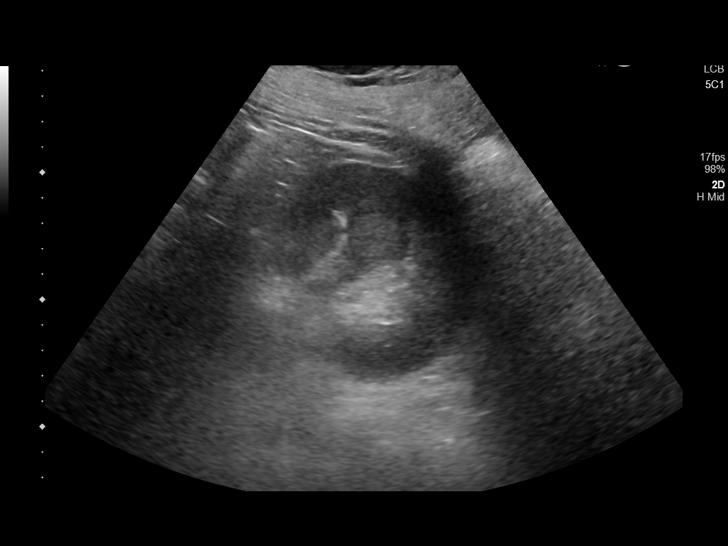
[im 35/35]
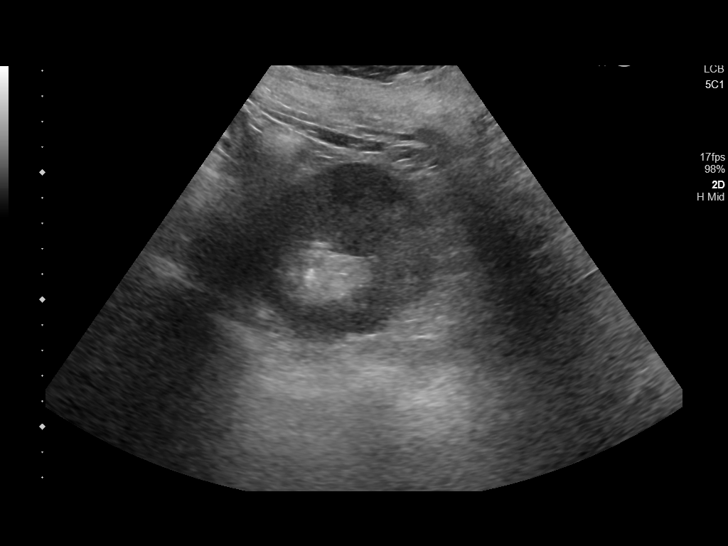

[14 of 25 positions shown; findings below may reference images not displayed]

FINDINGS: Right Kidney:

Renal measurements: 13.2 x 5.7 x 5.3 cm = volume: 214 mL. Cortical
echogenicity is normal. No hydronephrosis or shadowing stone.

Left Kidney:

Renal measurements: 15.1 x 8.6 x 6.5 cm = volume: 460 mL. No
hydronephrosis or shadowing stone. Upper pole appears slightly
echogenic.

Bladder:

Appears normal for degree of bladder distention.
IMPRESSION: 1. Negative for hydronephrosis.
2. Suggestion of slightly echogenic upper pole left kidney, can be
seen in the setting of pyelonephritis.

## 2018-05-23 IMAGING — CR CHEST - 2 VIEW
1 series · 3 of 3 positions shown · non-contrast
Comparison: [DATE]

CLINICAL DATA: Smoking history.  Shortness of breath.

EXAM:
CHEST - 2 VIEW

[Series 1: dg chest 2 view · 0.14mm/px · 3 of 3 slices shown]
[im 1/3]
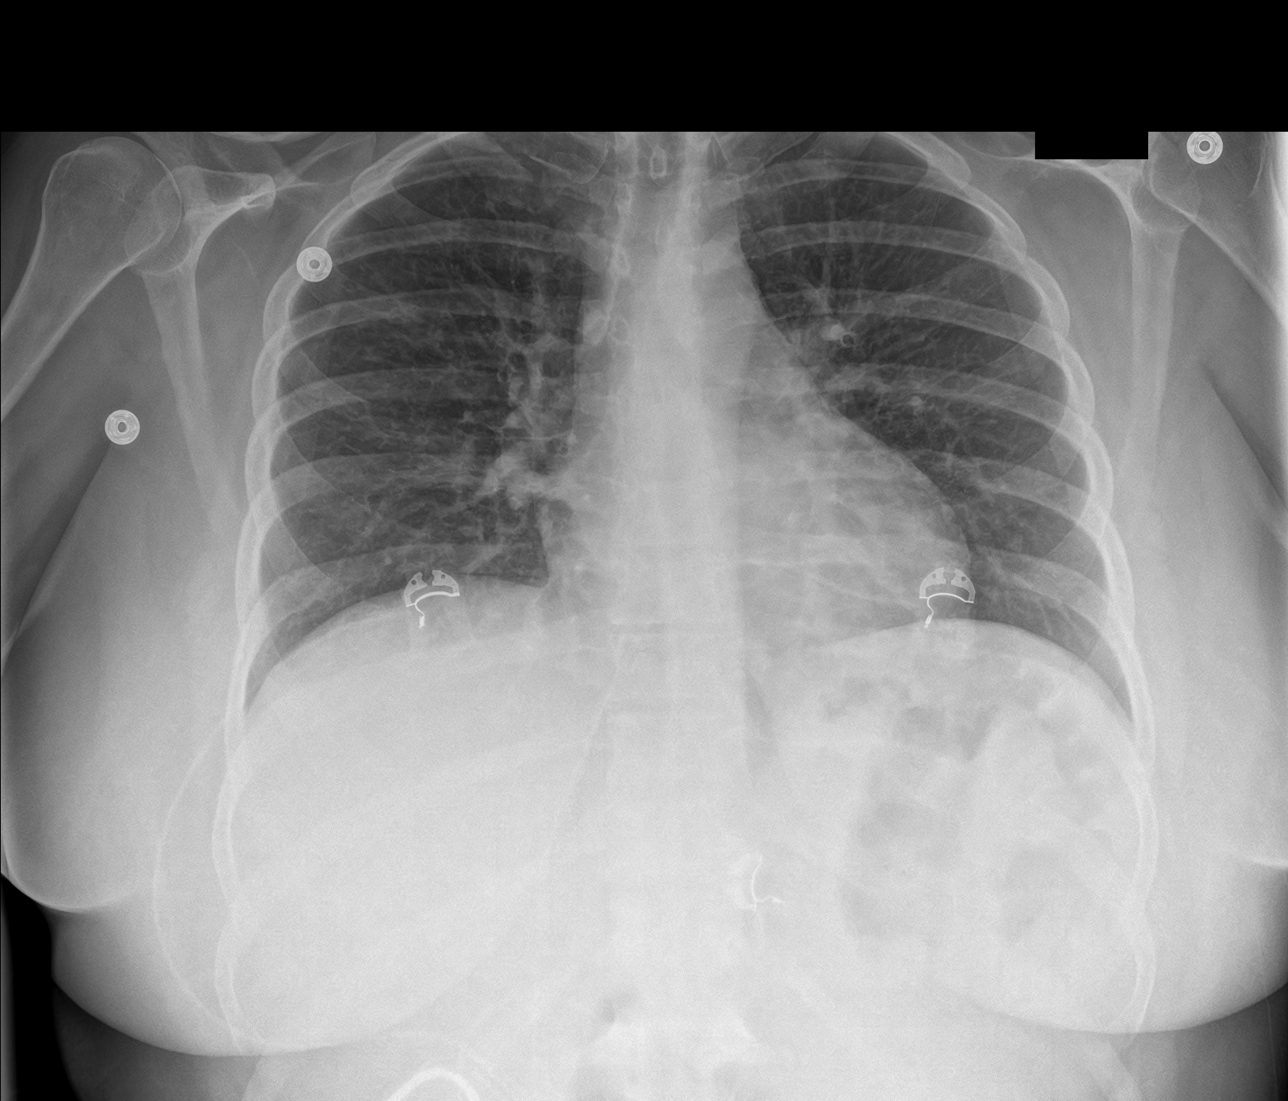
[im 2/3]
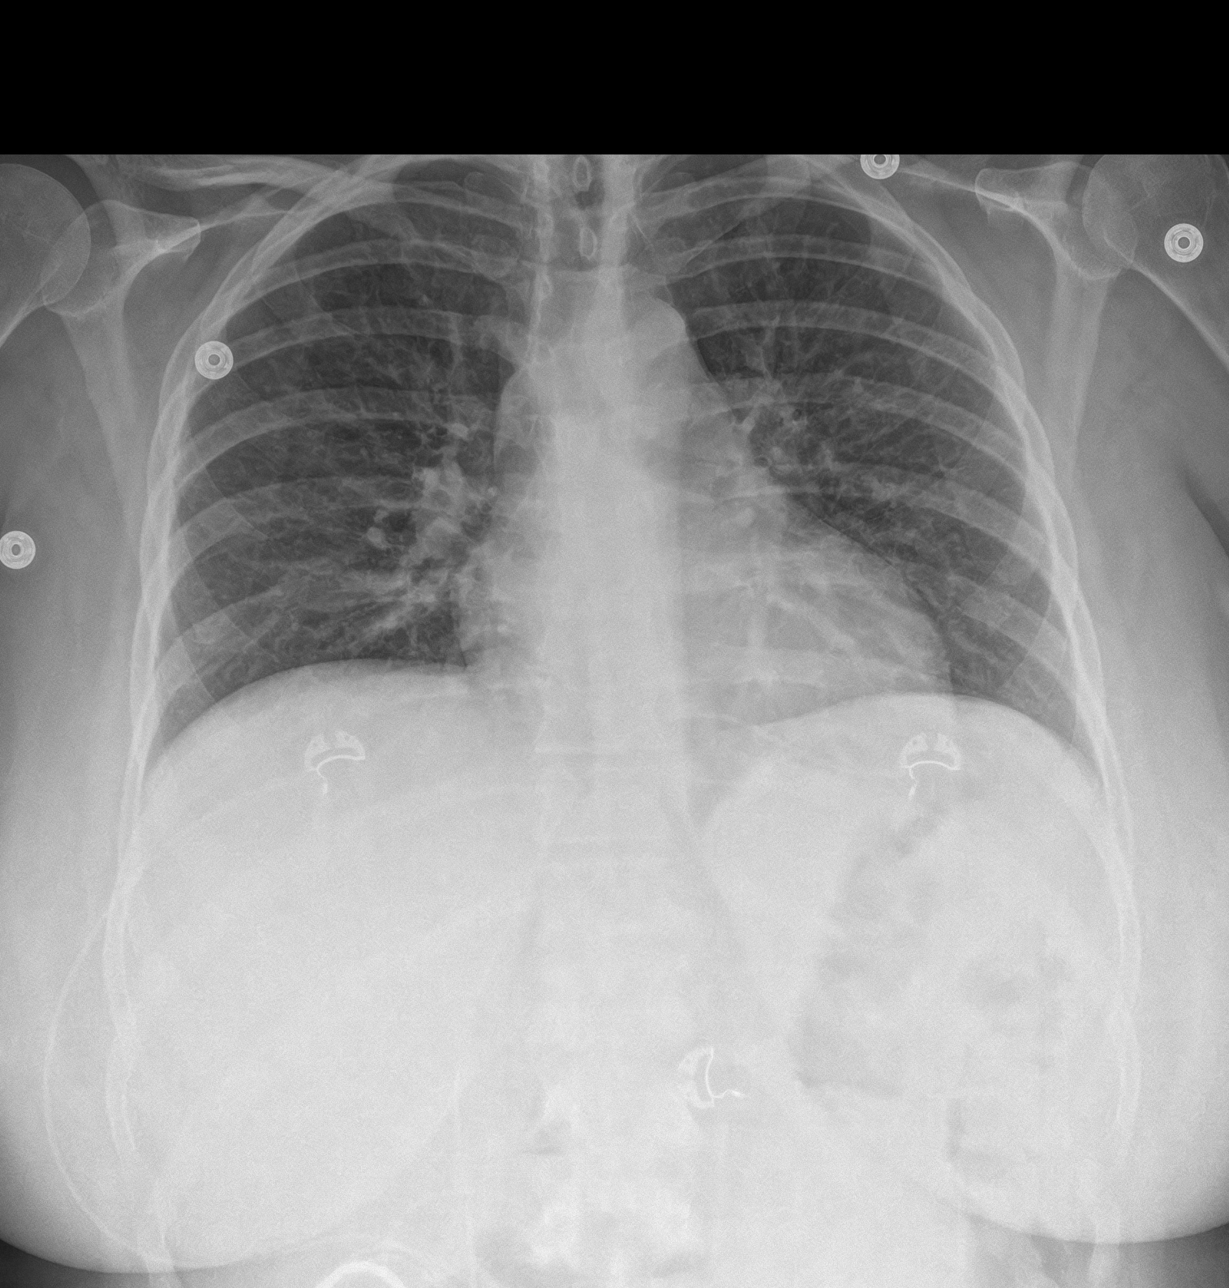
[im 3/3]
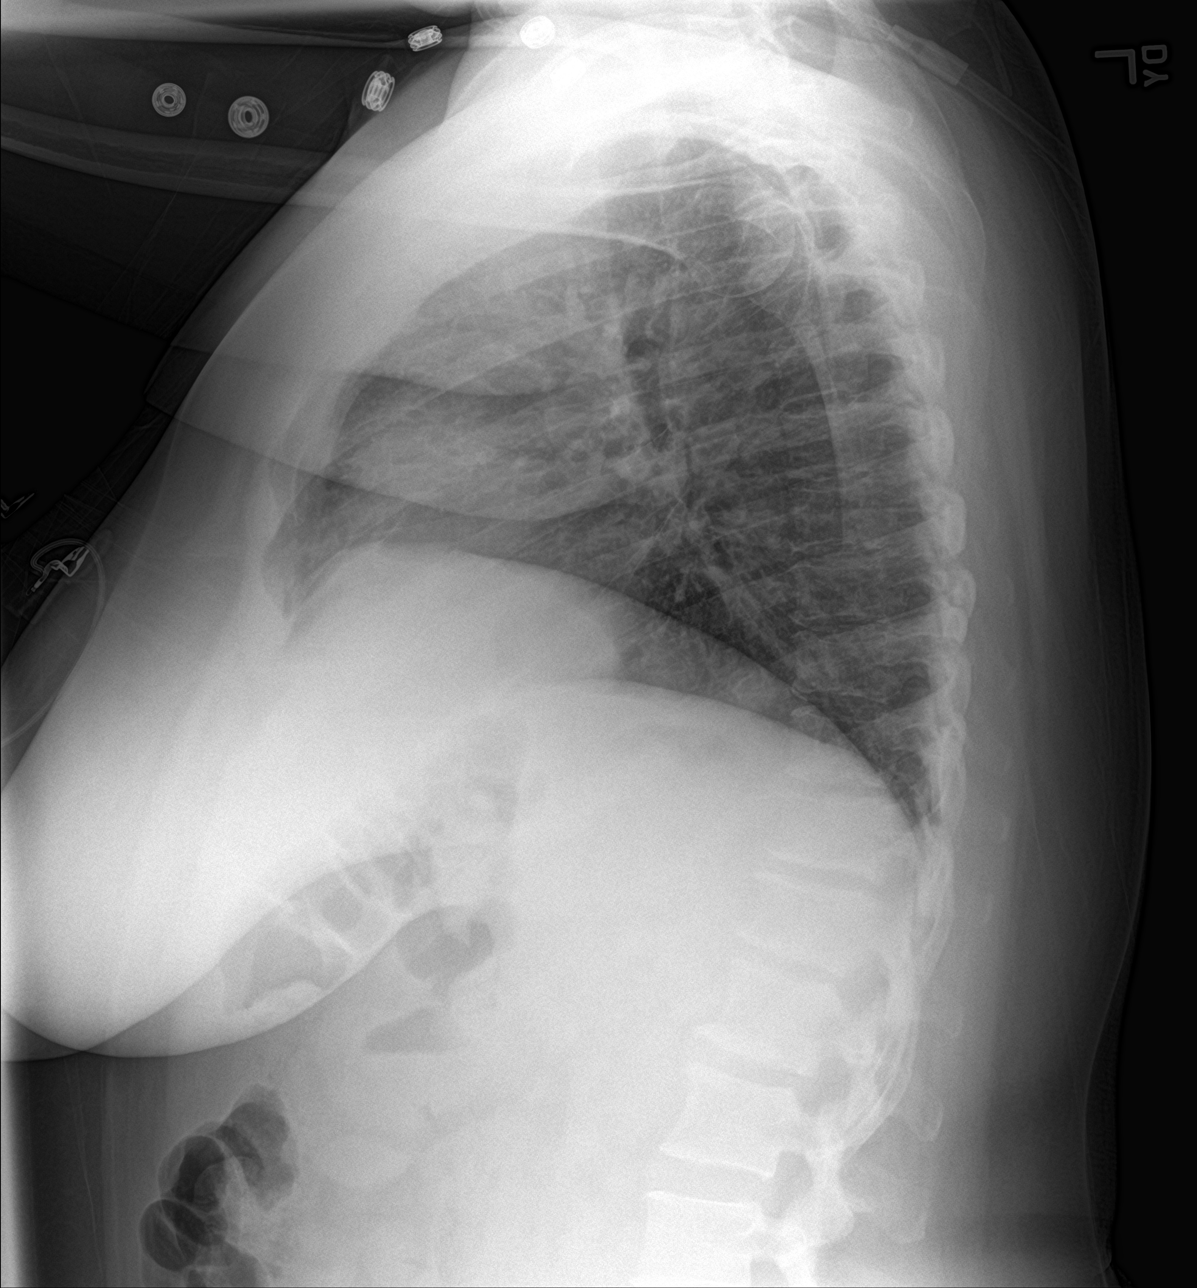

[3 of 3 positions shown; findings below may reference images not displayed]

FINDINGS: The heart size and mediastinal contours are within normal limits.
Both lungs are clear. The visualized skeletal structures are
unremarkable.
IMPRESSION: No active cardiopulmonary disease.

## 2018-05-23 MED ORDER — OXYCODONE-ACETAMINOPHEN 5-325 MG PO TABS
1.0000 | ORAL_TABLET | Freq: Four times a day (QID) | ORAL | Status: DC | PRN
  Administered 2018-05-23 – 2018-05-25 (×6): 1 via ORAL
  Filled 2018-05-23 (×6): qty 1

## 2018-05-23 MED ORDER — OXYCODONE-ACETAMINOPHEN 5-325 MG PO TABS
1.0000 | ORAL_TABLET | Freq: Four times a day (QID) | ORAL | Status: DC | PRN
  Administered 2018-05-23: 1 via ORAL
  Filled 2018-05-23: qty 1

## 2018-05-23 MED ORDER — ENOXAPARIN SODIUM 30 MG/0.3ML ~~LOC~~ SOLN
30.0000 mg | SUBCUTANEOUS | Status: DC
  Administered 2018-05-23: 30 mg via SUBCUTANEOUS
  Filled 2018-05-23: qty 0.3

## 2018-05-23 MED ORDER — TRAMADOL HCL 50 MG PO TABS
50.0000 mg | ORAL_TABLET | Freq: Once | ORAL | Status: AC
  Administered 2018-05-23: 06:00:00 50 mg via ORAL
  Filled 2018-05-23: qty 1

## 2018-05-23 MED ORDER — SODIUM CHLORIDE 0.9 % IV SOLN
1.0000 g | Freq: Two times a day (BID) | INTRAVENOUS | Status: DC
  Administered 2018-05-23 – 2018-05-25 (×5): 1 g via INTRAVENOUS
  Filled 2018-05-23 (×6): qty 1

## 2018-05-23 MED ORDER — QUETIAPINE FUMARATE 25 MG PO TABS
50.0000 mg | ORAL_TABLET | Freq: Every day | ORAL | Status: DC
  Administered 2018-05-24 (×2): 50 mg via ORAL
  Filled 2018-05-23 (×2): qty 2

## 2018-05-23 MED ORDER — SODIUM CHLORIDE 0.9 % IV SOLN
Freq: Once | INTRAVENOUS | Status: AC
  Administered 2018-05-23: 01:00:00 via INTRAVENOUS

## 2018-05-23 MED ORDER — MORPHINE SULFATE (PF) 2 MG/ML IV SOLN
2.0000 mg | INTRAVENOUS | Status: DC | PRN
  Administered 2018-05-23 – 2018-05-25 (×8): 2 mg via INTRAVENOUS
  Filled 2018-05-23 (×8): qty 1

## 2018-05-23 NOTE — Plan of Care (Signed)
  Problem: Health Behavior/Discharge Planning: Goal: Ability to manage health-related needs will improve Outcome: Progressing   Problem: Clinical Measurements: Goal: Ability to maintain clinical measurements within normal limits will improve Outcome: Progressing Goal: Will remain free from infection Outcome: Progressing Goal: Diagnostic test results will improve Outcome: Progressing Goal: Respiratory complications will improve Outcome: Progressing Goal: Cardiovascular complication will be avoided Outcome: Progressing   Problem: Activity: Goal: Risk for activity intolerance will decrease Outcome: Progressing   Problem: Nutrition: Goal: Adequate nutrition will be maintained Outcome: Progressing   Problem: Coping: Goal: Level of anxiety will decrease Outcome: Progressing   Problem: Pain Managment: Goal: General experience of comfort will improve Outcome: Progressing   Problem: Safety: Goal: Ability to remain free from injury will improve Outcome: Progressing   

## 2018-05-23 NOTE — Progress Notes (Signed)
Anticoagulation monitoring(Lovenox):  48 yo female ordered Lovenox 40 mg Q24h  Filed Weights   05/22/18 1514 05/22/18 2005  Weight: 190 lb (86.2 kg) 201 lb 1 oz (91.2 kg)   Body mass index is 36.77 kg/m.   Lab Results  Component Value Date   CREATININE 2.61 (H) 05/23/2018   CREATININE 2.22 (H) 05/22/2018   CREATININE 2.29 (H) 05/22/2018   Estimated Creatinine Clearance: 28.3 mL/min (A) (by C-G formula based on SCr of 2.61 mg/dL (H)). Hemoglobin & Hematocrit     Component Value Date/Time   HGB 9.8 (L) 05/22/2018 2042   HGB 11.8 (L) 05/11/2014 1424   HCT 32.5 (L) 05/22/2018 2042   HCT 37.4 05/11/2014 1424     Per Protocol for Patient with estCrcl < 30 ml/min and BMI < 40, will transition to Lovenox 30 mg Q24h.

## 2018-05-23 NOTE — Progress Notes (Signed)
PHARMACY - PHYSICIAN COMMUNICATION CRITICAL VALUE ALERT - BLOOD CULTURE IDENTIFICATION (BCID)  Brenda [name] is an 48 y.o. female who presented to [facility] on 05/22/2018 with a chief complaint of SOB w/ prior admit w/ same, was diagnosed as CAP/COPD exacerbation sent home w/ keflex + doxy and prednisone. Returns for worsening SOB.  Assessment:  HR 120 - 130's, LA 3.5 >> 1.3, PCT 34.96 >> 33.24, WBC 21.4 >> 23.0 >> 16.9, UA positive for nitrites, leukocyte esterase, rare bacteria, 4/4 GNR BCID: Enterobacteriaceae E. Coli KPC negative.  Name of physician (or Provider) Contacted: Janeann Merl  Current antibiotics: Vanc/cefepime  Changes to prescribed antibiotics recommended:  Recommendations accepted by provider -- Will switch to meropenem 1g IV q12h for coverage of possible ESBL E. Coli and per CrCl 20 - 50 ml/min.  Results for orders placed or performed during the hospital encounter of 05/22/18  Blood Culture ID Panel (Reflexed) (Collected: 05/22/2018  3:28 PM)  Result Value Ref Range   Enterococcus species NOT DETECTED NOT DETECTED   Listeria monocytogenes NOT DETECTED NOT DETECTED   Staphylococcus species NOT DETECTED NOT DETECTED   Staphylococcus aureus (BCID) NOT DETECTED NOT DETECTED   Streptococcus species NOT DETECTED NOT DETECTED   Streptococcus agalactiae NOT DETECTED NOT DETECTED   Streptococcus pneumoniae NOT DETECTED NOT DETECTED   Streptococcus pyogenes NOT DETECTED NOT DETECTED   Acinetobacter baumannii NOT DETECTED NOT DETECTED   Enterobacteriaceae species DETECTED (A) NOT DETECTED   Enterobacter cloacae complex NOT DETECTED NOT DETECTED   Escherichia coli DETECTED (A) NOT DETECTED   Klebsiella oxytoca NOT DETECTED NOT DETECTED   Klebsiella pneumoniae NOT DETECTED NOT DETECTED   Proteus species NOT DETECTED NOT DETECTED   Serratia marcescens NOT DETECTED NOT DETECTED   Carbapenem resistance NOT DETECTED NOT DETECTED   Haemophilus influenzae NOT DETECTED NOT  DETECTED   Neisseria meningitidis NOT DETECTED NOT DETECTED   Pseudomonas aeruginosa NOT DETECTED NOT DETECTED   Candida albicans NOT DETECTED NOT DETECTED   Candida glabrata NOT DETECTED NOT DETECTED   Candida krusei NOT DETECTED NOT DETECTED   Candida parapsilosis NOT DETECTED NOT DETECTED   Candida tropicalis NOT DETECTED NOT DETECTED   Thomasene Ripple, PharmD, BCPS Clinical Pharmacist 05/23/2018

## 2018-05-23 NOTE — Progress Notes (Signed)
Patient resting in bed watching tv. Pain meds given for back pain. Bed low, alarm on, call bell in reach. Continue to monitor.

## 2018-05-23 NOTE — Progress Notes (Signed)
[facility] [place] [facility]   PATIENT NAME: Brenda [name] [name]    MR#:  [id number]  DATE OF BIRTH:  07-29-1970  SUBJECTIVE:  CHIEF COMPLAINT:   Chief Complaint  Patient presents with  . Shortness of Breath   Came with significant lower back pain with nausea and not able to eat or drink much and getting short of breath for last few days to a week.  Noted to be septic and found to have gram-negative bacteremia.  Still feels the same.  REVIEW OF SYSTEMS:  CONSTITUTIONAL: Have fever, fatigue or weakness.  EYES: No blurred or double vision.  EARS, NOSE, AND THROAT: No tinnitus or ear pain.  RESPIRATORY: No cough, shortness of breath, wheezing or hemoptysis.  CARDIOVASCULAR: No chest pain, orthopnea, edema.  GASTROINTESTINAL: Have nausea, vomiting, no diarrhea or abdominal pain.  GENITOURINARY: No dysuria, hematuria.  ENDOCRINE: No polyuria, nocturia,  HEMATOLOGY: No anemia, easy bruising or bleeding SKIN: No rash or lesion. MUSCULOSKELETAL: No joint pain or arthritis.  Have bilateral lower back pain. NEUROLOGIC: No tingling, numbness, weakness.  PSYCHIATRY: No anxiety or depression.   ROS  DRUG ALLERGIES:   Allergies  Allergen Reactions  . Flexeril [Cyclobenzaprine] Other (See Comments)    RLS-like symptoms   . Seroquel [Quetiapine Fumarate] Other (See Comments)    RLS-like symptoms    VITALS:  Blood pressure (!) 124/91, pulse (!) 108, temperature 98.7 F (37.1 C), temperature source Oral, resp. rate 17, height 5\' 2"  (1.575 m), weight 91.2 kg, SpO2 99 %.  PHYSICAL EXAMINATION:  GENERAL:  48 y.o.-year-old patient lying in the bed with acute distress due to pain.  EYES: Pupils equal, round, reactive to light and accommodation. No scleral icterus. Extraocular muscles intact.  HEENT: Head atraumatic, normocephalic. Oropharynx and nasopharynx clear.  NECK:  Supple, no jugular venous distention. No thyroid enlargement, no tenderness.  LUNGS: Normal breath  sounds bilaterally, no wheezing, rales,rhonchi or crepitation. No use of accessory muscles of respiration.  CARDIOVASCULAR: S1, S2 normal. No murmurs, rubs, or gallops.  ABDOMEN: Soft, nontender, nondistended. Bowel sounds present. No organomegaly or mass.  Tender on lower back on the sides. EXTREMITIES: No pedal edema, cyanosis, or clubbing.  NEUROLOGIC: Cranial nerves II through XII are intact. Muscle strength 5/5 in all extremities. Sensation intact. Gait not checked.  PSYCHIATRIC: The patient is alert and oriented x 3.  SKIN: No obvious rash, lesion, or ulcer.   Physical Exam LABORATORY PANEL:   CBC Recent Labs  Lab 05/22/18 2042  WBC 16.9*  HGB 9.8*  HCT 32.5*  PLT 247   ------------------------------------------------------------------------------------------------------------------  Chemistries  Recent Labs  Lab 05/22/18 1527  05/23/18 0629  NA 135   < > 135  K 3.7   < > 3.6  CL 97*   < > 107  CO2 20*   < > 19*  GLUCOSE 145*   < > 91  BUN 29*   < > 38*  CREATININE 2.34*   < > 2.61*  CALCIUM 8.1*   < > 6.8*  AST 53*  --   --   ALT 58*  --   --   ALKPHOS 102  --   --   BILITOT 0.7  --   --    < > = values in this interval not displayed.   ------------------------------------------------------------------------------------------------------------------  Cardiac Enzymes Recent Labs  Lab 05/22/18 2042 05/23/18 0629  TROPONINI <0.03 <0.03   ------------------------------------------------------------------------------------------------------------------  RADIOLOGY:  Dg Chest 2 View  Result Date: 05/23/2018 CLINICAL  DATA:  Smoking history.  Shortness of breath. EXAM: CHEST - 2 VIEW COMPARISON:  May 22, 2018 FINDINGS: The heart size and mediastinal contours are within normal limits. Both lungs are clear. The visualized skeletal structures are unremarkable. IMPRESSION: No active cardiopulmonary disease. Electronically Signed   By: Gerome Sam III M.D   On:  05/23/2018 07:11   Dg Chest Port 1 View  Result Date: 05/22/2018 CLINICAL DATA:  Short of breath and cough EXAM: PORTABLE CHEST 1 VIEW COMPARISON:  04/30/2018 FINDINGS: Normal heart size. Lungs clear. No pneumothorax. No pleural effusion. IMPRESSION: No active disease. Electronically Signed   By: Jolaine Click M.D.   On: 05/22/2018 16:20    ASSESSMENT AND PLAN:   Active Problems:   Sepsis (HCC)  Sepsis- secondary to UTI and bacteremia.    Chest x-ray negative. -COVID testing negative -Suspected H CAP on admission but ruled out. -Vancomycin and zosyn-was started and changed to meropenem after getting blood culture with gram-negative rods. -Procalcitonin is high but it could be due to sepsis and bacteremia -Follow-up blood and urine cultures  Acute hypoxic respiratory failure- requiring 3 L O2 in the ED.  elevated d-dimer, and patient spending a lot of time in bed,  She is on room air now and saturations are fine and appears to be comfortable. -Unable to order CTA chest due to AKI.   On admission there was a consideration to do a CT angiogram of the chest to rule out pulmonary embolism but I do not think that is a strong possibility now as patient is on room air.  I would like to hold off on that testing for now. -Repeat 2 view CXR in the morning appears clear.  AKI- due to decreased p.o. intake and sepsis -Continue fluids -Avoid nephrotoxic agents as able -Get renal ultrasound to rule out obstruction with presence of UTI and bacteremia.  Elevated liver transaminases- likely due to sepsis -Hepatitis panel is in process   Microcytic anemia- Hgb 10.7, ferritin normal. -Monitor -We will get iron studies and give supplemental iron if needed.  Tobacco abuse- smokes 1 pack/day -Nicotine patch as needed    All the records are reviewed and case discussed with Care Management/Social Workerr. Management plans discussed with the patient, family and they are in agreement.  CODE  STATUS: Full code.  TOTAL TIME TAKING CARE OF THIS PATIENT: 35 minutes.     POSSIBLE D/C IN 1-2 DAYS, DEPENDING ON CLINICAL CONDITION.   Altamese Dilling M.D on 05/23/2018   Between 7am to 6pm - Pager - [phone]  After 6pm go to [email or web address] - password [email or web address]  [facility]  Office  [phone]  CC: Primary care physician; System, Pcp Not In  Note: This dictation was prepared with Dragon dictation along with smaller phrase technology. Any transcriptional errors that result from this process are unintentional.

## 2018-05-24 ENCOUNTER — Inpatient Hospital Stay: Payer: Self-pay

## 2018-05-24 LAB — HEPATITIS PANEL, ACUTE
HCV Ab: >11 s/co ratio — ABNORMAL HIGH (ref 0.0–0.9)
Hep A IgM: NEGATIVE
Hep B C IgM: NEGATIVE
Hepatitis B Surface Ag: NEGATIVE

## 2018-05-24 LAB — COMPREHENSIVE METABOLIC PANEL
ALT: 34 U/L (ref 0–44)
AST: 31 U/L (ref 15–41)
Albumin: 2.2 g/dL — ABNORMAL LOW (ref 3.5–5.0)
Alkaline Phosphatase: 78 U/L (ref 38–126)
Anion gap: 9 (ref 5–15)
BUN: 34 mg/dL — ABNORMAL HIGH (ref 6–20)
CO2: 19 mmol/L — ABNORMAL LOW (ref 22–32)
Calcium: 7.3 mg/dL — ABNORMAL LOW (ref 8.9–10.3)
Chloride: 106 mmol/L (ref 98–111)
Creatinine, Ser: 2.15 mg/dL — ABNORMAL HIGH (ref 0.44–1.00)
GFR calc Af Amer: 31 mL/min — ABNORMAL LOW (ref >60)
GFR calc non Af Amer: 27 mL/min — ABNORMAL LOW (ref >60)
Glucose, Bld: 93 mg/dL (ref 70–99)
Potassium: 3.1 mmol/L — ABNORMAL LOW (ref 3.5–5.1)
Sodium: 134 mmol/L — ABNORMAL LOW (ref 135–145)
Total Bilirubin: 0.9 mg/dL (ref 0.3–1.2)
Total Protein: 5.7 g/dL — ABNORMAL LOW (ref 6.5–8.1)

## 2018-05-24 LAB — CBC
HCT: 30.7 % — ABNORMAL LOW (ref 36.0–46.0)
Hemoglobin: 9.4 g/dL — ABNORMAL LOW (ref 12.0–15.0)
MCH: 20.3 pg — ABNORMAL LOW (ref 26.0–34.0)
MCHC: 30.6 g/dL (ref 30.0–36.0)
MCV: 66.2 fL — ABNORMAL LOW (ref 80.0–100.0)
Platelets: 168 10*3/uL (ref 150–400)
RBC: 4.64 MIL/uL (ref 3.87–5.11)
RDW: 20.2 % — ABNORMAL HIGH (ref 11.5–15.5)
WBC: 10.4 10*3/uL (ref 4.0–10.5)
nRBC: 0.2 % (ref 0.0–0.2)

## 2018-05-24 LAB — URINE CULTURE

## 2018-05-24 LAB — LACTIC ACID, PLASMA: Lactic Acid, Venous: 1.1 mmol/L (ref 0.5–1.9)

## 2018-05-24 LAB — PROCALCITONIN: Procalcitonin: 33.9 ng/mL

## 2018-05-24 LAB — HIV ANTIBODY (ROUTINE TESTING W REFLEX): HIV Screen 4th Generation wRfx: NONREACTIVE

## 2018-05-24 IMAGING — CT CT RENAL STONE PROTOCOL
2 of 4 series · 16 of 46 positions shown, 18 images · non-contrast
Comparison: No priors.

CLINICAL DATA: 48-year-old female with history of sepsis. Urinary
tract infection. Flank pain. Evaluate for obstructing stone or
pyelonephritis.

EXAM:
CT ABDOMEN AND PELVIS WITHOUT CONTRAST
TECHNIQUE: Multidetector CT imaging of the abdomen and pelvis was performed
following the standard protocol without IV contrast.

[Series 2: stone full standard · axial · 0.87mm/px · z∈[-955,-480]mm · 13 of 105 slices shown, 15 images]
[im 5/105  soft-tissue]
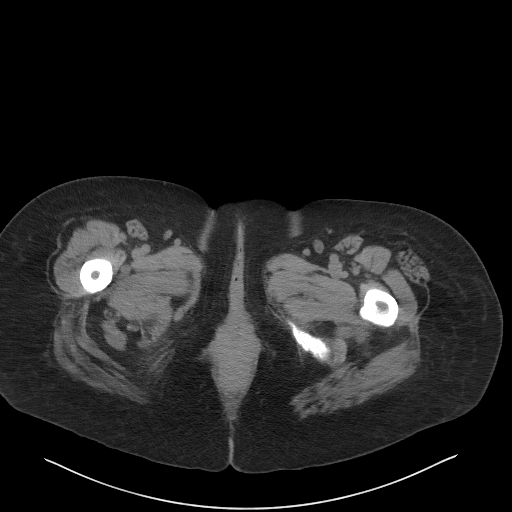
[im 5/105  bone]
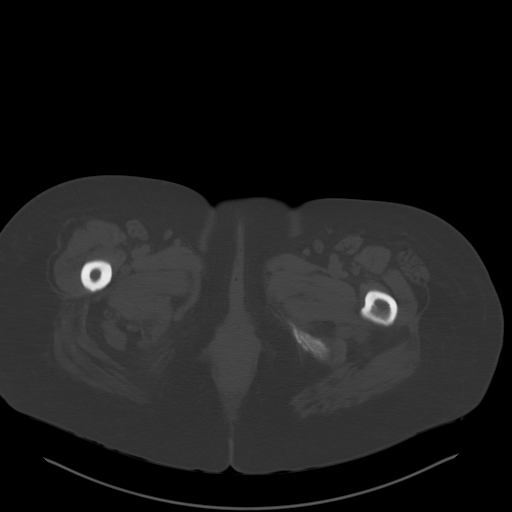
[im 14/105  soft-tissue]
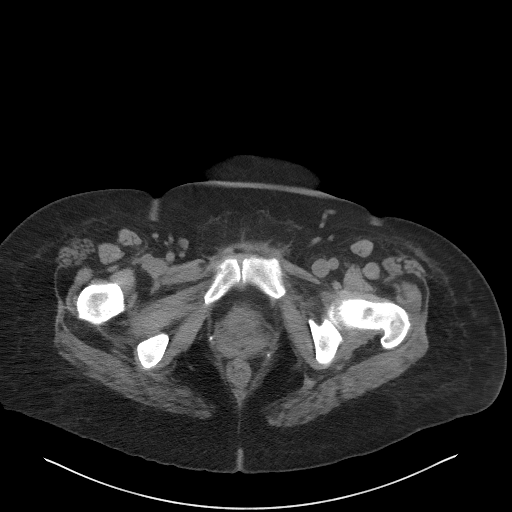
[im 22/105  soft-tissue]
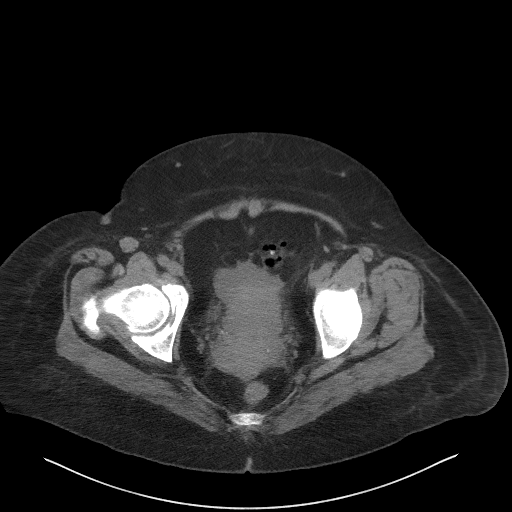
[im 31/105  soft-tissue]
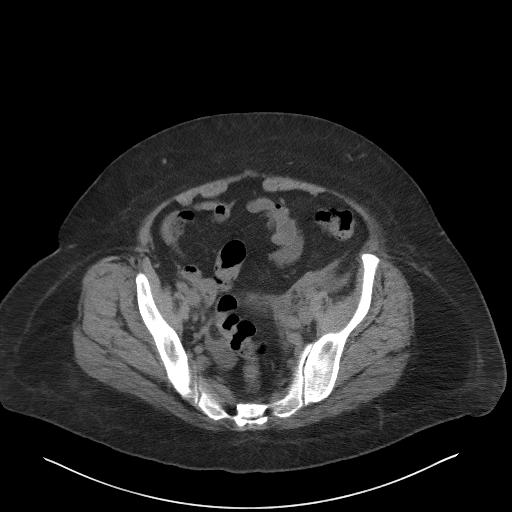
[im 35/105  soft-tissue]
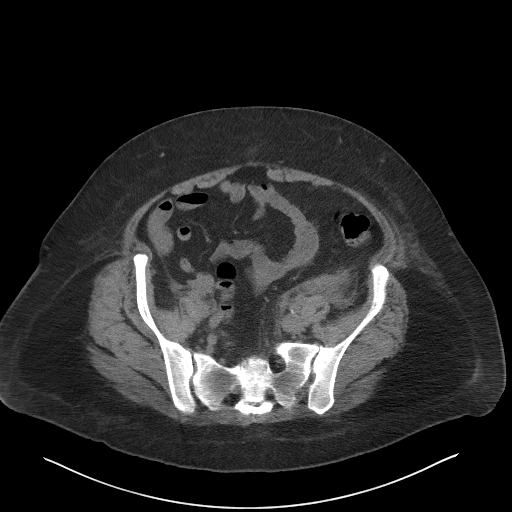
[im 44/105  soft-tissue]
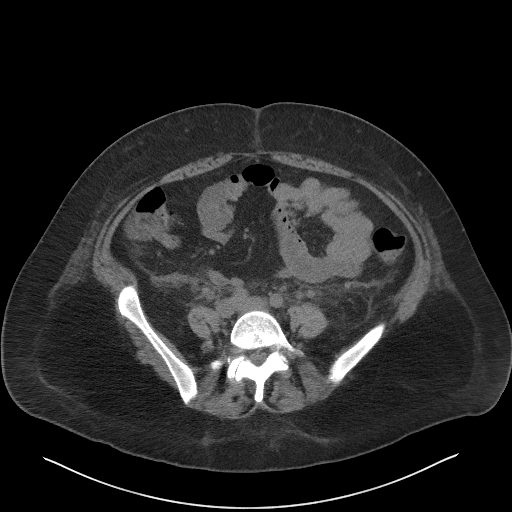
[im 53/105  soft-tissue]
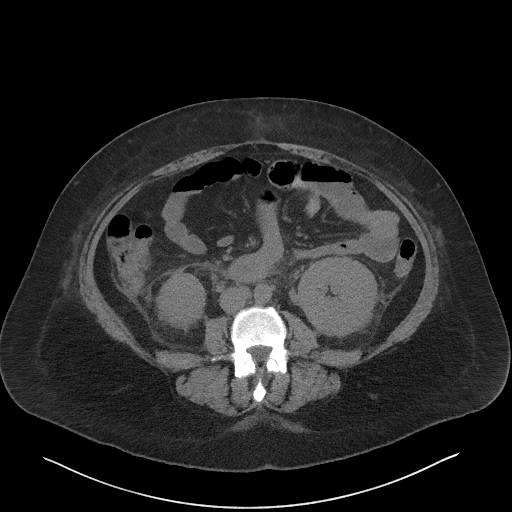
[im 61/105  soft-tissue]
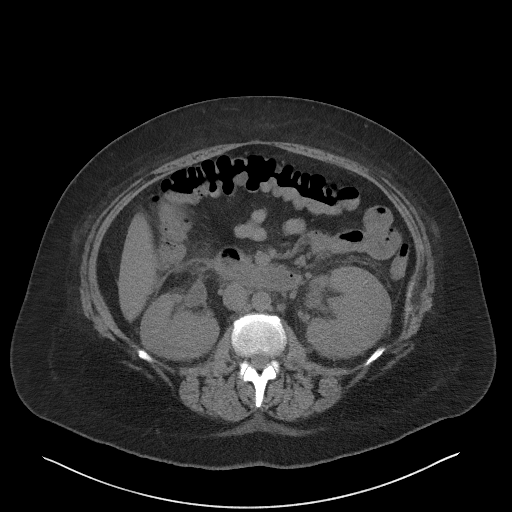
[im 70/105  soft-tissue]
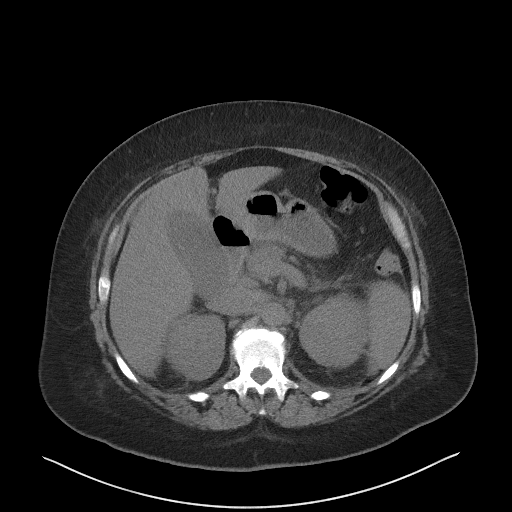
[im 70/105  bone]
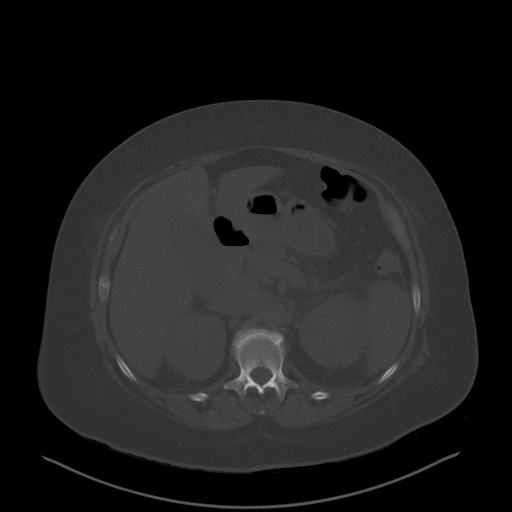
[im 74/105  soft-tissue]
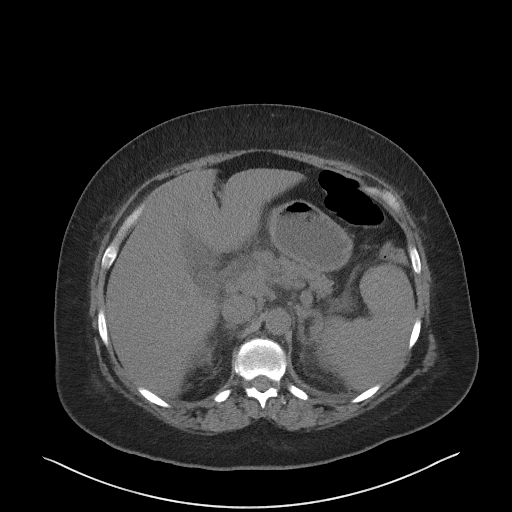
[im 83/105  soft-tissue]
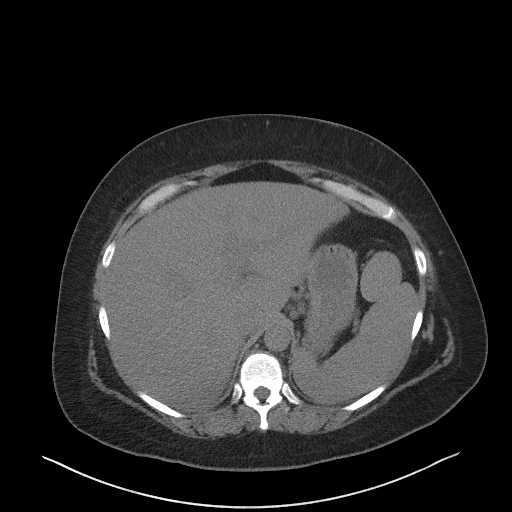
[im 92/105  soft-tissue]
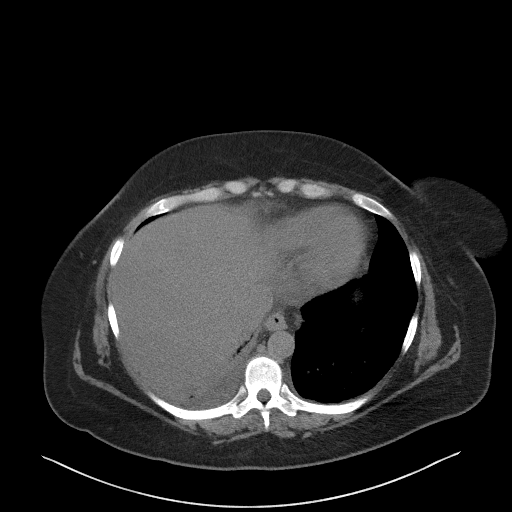
[im 100/105  soft-tissue]
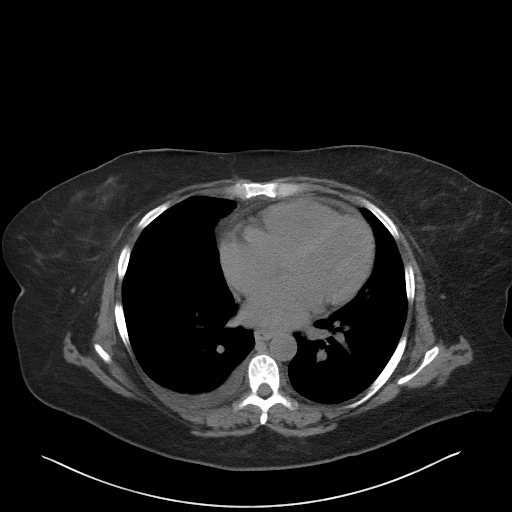

[Series 5: coronal · coronal · 0.88mm/px · 3 of 159 slices shown]
[im 53/159  soft-tissue]
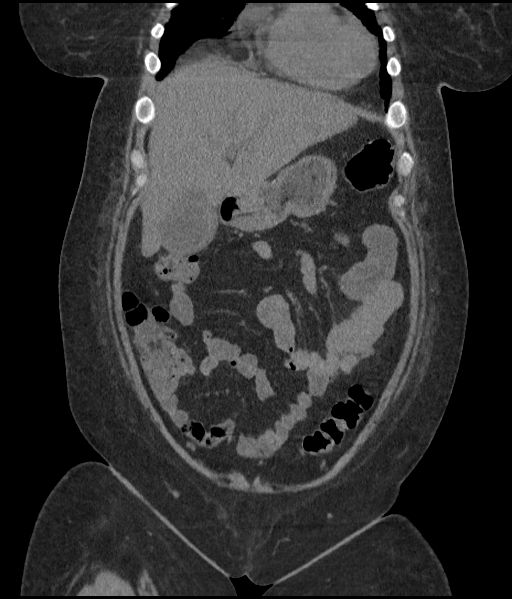
[im 71/159  soft-tissue]
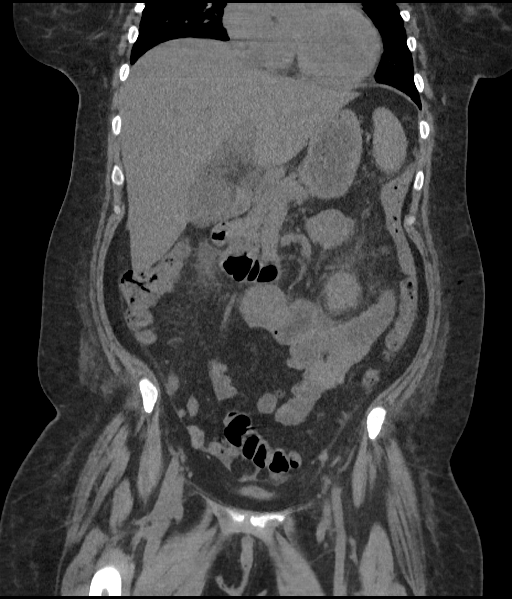
[im 88/159  soft-tissue]
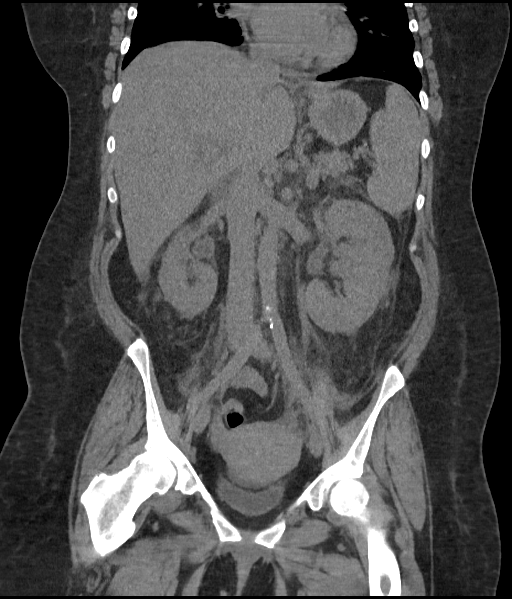

[16 of 46 positions shown; findings below may reference images not displayed]

FINDINGS: Lower chest: Small right pleural effusion lying dependently with
some associated passive subsegmental atelectasis in the right lung
base.

Hepatobiliary: Diffuse low attenuation throughout the hepatic
parenchyma, indicative of hepatic steatosis. No discrete cystic or
solid hepatic lesions are confidently identified on today's
noncontrast CT examination. No definite calcified gallstones within
the lumen of the gallbladder. Gallbladder does appear distended,
with thickening and edema in the gallbladder wall which measures up
to 14 mm in thickness.

Pancreas: No definite pancreatic mass or peripancreatic fluid
collections. Mild diffuse haziness throughout the retroperitoneal
fat.

Spleen: Unremarkable.

Adrenals/Urinary Tract: No calculi are identified within the
collecting system of either kidney, along the course of either
ureter, or within the lumen of the urinary bladder. No
hydroureteronephrosis. Extensive perinephric and periureteric
stranding bilaterally. Urinary bladder is grossly unremarkable in
appearance.

Stomach/Bowel: Unenhanced appearance of the stomach is normal. No
pathologic dilatation of small bowel or colon. Normal appendix.

Vascular/Lymphatic: Aortic atherosclerosis. No lymphadenopathy noted
in the abdomen or pelvis.

Reproductive: Uterus and ovaries are unremarkable in appearance.

Other: Small volume of ascites.  No pneumoperitoneum.

Musculoskeletal: There are no aggressive appearing lytic or blastic
lesions noted in the visualized portions of the skeleton.
IMPRESSION: 1. No urinary tract calculi. No findings of urinary tract
obstruction.
2. Extensive bilateral perinephric and periureteric soft tissue
stranding with edema throughout the retroperitoneum. These findings
are nonspecific, but clinical correlation for signs and symptoms of
pyelonephritis is suggested.
3. Gallbladder appears moderately distended with extensive mural
thickening and edema. No calcified gallstones are identified. If
there is clinical concern for acute cholecystitis, further
evaluation with right upper quadrant ultrasound should be
considered.
4. Hepatic steatosis.
5. Small right pleural effusion lying dependently.

## 2018-05-24 MED ORDER — LIDOCAINE 5 % EX PTCH
1.0000 | MEDICATED_PATCH | CUTANEOUS | Status: DC
  Administered 2018-05-24 – 2018-05-25 (×2): 1 via TRANSDERMAL
  Filled 2018-05-24 (×2): qty 1

## 2018-05-24 MED ORDER — CALCIUM CARBONATE ANTACID 500 MG PO CHEW
1.0000 | CHEWABLE_TABLET | Freq: Two times a day (BID) | ORAL | Status: DC
  Administered 2018-05-24 – 2018-05-25 (×2): 200 mg via ORAL
  Filled 2018-05-24 (×2): qty 1

## 2018-05-24 MED ORDER — CYCLOBENZAPRINE HCL 10 MG PO TABS
5.0000 mg | ORAL_TABLET | Freq: Three times a day (TID) | ORAL | Status: DC
  Administered 2018-05-24 – 2018-05-25 (×4): 5 mg via ORAL
  Filled 2018-05-24 (×4): qty 1

## 2018-05-24 MED ORDER — DILTIAZEM HCL ER COATED BEADS 120 MG PO CP24
120.0000 mg | ORAL_CAPSULE | Freq: Every day | ORAL | Status: DC
  Administered 2018-05-24 – 2018-05-25 (×2): 120 mg via ORAL
  Filled 2018-05-24 (×2): qty 1

## 2018-05-24 MED ORDER — CALCIUM GLUCONATE-NACL 1-0.675 GM/50ML-% IV SOLN
1.0000 g | Freq: Once | INTRAVENOUS | Status: AC
  Administered 2018-05-24: 1000 mg via INTRAVENOUS
  Filled 2018-05-24: qty 50

## 2018-05-24 MED ORDER — POTASSIUM CHLORIDE CRYS ER 20 MEQ PO TBCR
20.0000 meq | EXTENDED_RELEASE_TABLET | Freq: Once | ORAL | Status: AC
  Administered 2018-05-24: 20 meq via ORAL
  Filled 2018-05-24: qty 1

## 2018-05-24 MED ORDER — ENOXAPARIN SODIUM 40 MG/0.4ML ~~LOC~~ SOLN
40.0000 mg | SUBCUTANEOUS | Status: DC
  Administered 2018-05-24: 40 mg via SUBCUTANEOUS
  Filled 2018-05-24: qty 0.4

## 2018-05-24 NOTE — TOC Initial Note (Signed)
Transition of Care [facility](TOC) - Initial/Assessment Note    Patient Details  Name: Brenda RanaJennifer C [name] MRN: [id number] Date of Birth: 1970/02/12  Transition of Care [facility](TOC) CM/SW Contact:    Chapman FitchBOWEN, Skylie Hiott T, RN Phone Number: 05/24/2018, 3:13 PM  Clinical Narrative:                 Patient admitted with sepsis.    Patient lives at home with son, daughter, ex husband, and "dead boyfriends dad"  Patient is uninsured.  Patient does not have PCP.  Prior admission patient was provided with resources for free and low cost clinics.  Patient states "I haven't got around to calling them yet".  Patient declined for RNCM to provide her with additional copy of resources states "I still have the ones from last time"  Patient denies issues obtaining medications.  Utilizes [facility] on [address]   Patient denies any issues with transportation  RNCM following for medication needs   Expected Discharge Plan: Home/Self Care Barriers to Discharge: Continued Medical Work up   Patient Goals and CMS Choice        Expected Discharge Plan and Services Expected Discharge Plan: Home/Self Care       Living arrangements for the past 2 months: Single Family Home Expected Discharge Date: 05/25/18                                    Prior Living Arrangements/Services Living arrangements for the past 2 months: Single Family Home Lives with:: Relatives Patient language and need for interpreter reviewed:: No Do you feel safe going back to the place where you live?: Yes      Need for Family Participation in Patient Care: No (Comment) Care giver support system in place?: Yes (comment)   Criminal Activity/Legal Involvement Pertinent to Current Situation/Hospitalization: No - Comment as needed  Activities of Daily Living Home Assistive Devices/Equipment: Cane (specify quad or straight) ADL Screening (condition at time of admission) Patient's cognitive ability adequate to safely complete  daily activities?: Yes Is the patient deaf or have difficulty hearing?: No Does the patient have difficulty seeing, even when wearing glasses/contacts?: No Does the patient have difficulty concentrating, remembering, or making decisions?: No Patient able to express need for assistance with ADLs?: Yes Does the patient have difficulty dressing or bathing?: No Independently performs ADLs?: Yes (appropriate for developmental age) Does the patient have difficulty walking or climbing stairs?: No Weakness of Legs: Right Weakness of Arms/Hands: None  Permission Sought/Granted                  Emotional Assessment Appearance:: Appears stated age Attitude/Demeanor/Rapport: Gracious Affect (typically observed): Accepting Orientation: : Oriented to Self, Oriented to Place, Oriented to Situation   Psych Involvement: No (comment)  Admission diagnosis:  Acute kidney injury (HCC) [N17.9] Severe sepsis (HCC) [A41.9, R65.20] Community acquired pneumonia, unspecified laterality [J18.9] Patient Active Problem List   Diagnosis Date Noted  . Sepsis (HCC) 04/30/2018  . Opioid use disorder, moderate, dependence (HCC) 05/02/2016  . Tobacco use disorder 05/02/2016  . Severe recurrent major depression without psychotic features (HCC) 05/01/2016  . Cocaine use disorder, moderate, dependence (HCC) 05/01/2016  . Alcohol abuse 05/01/2016  . GERD (gastroesophageal reflux disease) 05/01/2016   PCP:  System, Pcp Not In Pharmacy:   [facility] [address] - [place] ([place]), [place] - [address] [place] [id number]  Phone: [phone] Fax: [phone]     Social Determinants of Health (SDOH) Interventions    Readmission Risk Interventions Readmission Risk Prevention Plan 05/24/2018  Transportation Screening Complete  HRI or Home Care Consult (No Data)  Palliative Care Screening Not Applicable  Medication Review (RN Care Manager) Complete  Some recent  data might be hidden

## 2018-05-24 NOTE — Care Management (Signed)
Attempted to meet with patient to complete assessment and high risk readmission prevention plan.  Patient is currently off the floor.  Will attempt at later time.

## 2018-05-24 NOTE — Progress Notes (Signed)
PHARMACIST - PHYSICIAN COMMUNICATION  CONCERNING:  Enoxaparin (Lovenox) for DVT Prophylaxis    RECOMMENDATION: Patient was prescribed enoxaprin 30mg  q24 hours for VTE prophylaxis.   Filed Weights   05/22/18 1514 05/22/18 2005  Weight: 190 lb (86.2 kg) 201 lb 1 oz (91.2 kg)    Body mass index is 36.77 kg/m.  Estimated Creatinine Clearance: 34.3 mL/min (A) (by C-G formula based on SCr of 2.15 mg/dL (H)).   Based on [facility] policy patient is candidate for enoxaparin 40mg  every 24 hour dosing due to CrCl > 30 mL/min  DESCRIPTION: Pharmacy has adjusted enoxaparin dose per [facility] policy.  Patient is now receiving enoxaparin 40mg  every 24 hours.    Ronnald Ramp, PharmD, BCPS Clinical Pharmacist  05/24/2018 1:53 PM

## 2018-05-24 NOTE — Consult Note (Signed)
Pharmacy Antibiotic Note  Brenda [name] is a 48 y.o. female admitted on 05/22/2018 with bacteremia.  Pharmacy has been consulted for meropenem dosing.  Plan: Continue Meropenem 1 g q12H. Follow up with sensitivities.   Height: 5\' 2"  (157.5 cm) Weight: 201 lb 1 oz (91.2 kg) IBW/kg (Calculated) : 50.1  Temp (24hrs), Avg:98.6 F (37 C), Min:98.3 F (36.8 C), Max:98.9 F (37.2 C)  Recent Labs  Lab 05/22/18 1527 05/22/18 1835 05/22/18 2042 05/23/18 0335 05/23/18 0629 05/24/18 0402  WBC 23.0*  --  16.9*  --   --  10.4  CREATININE 2.34* 2.29* 2.22*  --  2.61* 2.15*  LATICACIDVEN 3.5* 2.9*  --  1.3  --  1.1    Estimated Creatinine Clearance: 34.3 mL/min (A) (by C-G formula based on SCr of 2.15 mg/dL (H)).    Allergies  Allergen Reactions  . Flexeril [Cyclobenzaprine] Other (See Comments)    RLS-like symptoms   . Seroquel [Quetiapine Fumarate] Other (See Comments)    RLS-like symptoms    Antimicrobials this admission: 4/25 azitrhomycin >> 4/25 4/25 cefepime >> 4/25 4/25 ceftriaxone >> 4/25 4/26 meropenem >>   Dose adjustments this admission: None  Microbiology results: 4/25 BCx: BCID + for E.coli. growing GNR 4/25 UCx: none  4/27 repeat Bcx: pending  Thank you for allowing pharmacy to be a part of this patient's care.  Ronnald Ramp 05/24/2018 2:04 PM

## 2018-05-24 NOTE — Plan of Care (Addendum)
The patient continues to have pain. Lidocaine patch ordered as well as flexeril. Voiding. No falls.  Problem: Health Behavior/Discharge Planning: Goal: Ability to manage health-related needs will improve Outcome: Progressing   Problem: Clinical Measurements: Goal: Ability to maintain clinical measurements within normal limits will improve Outcome: Progressing Goal: Will remain free from infection Outcome: Progressing Goal: Diagnostic test results will improve Outcome: Progressing Goal: Respiratory complications will improve Outcome: Progressing Goal: Cardiovascular complication will be avoided Outcome: Progressing   Problem: Activity: Goal: Risk for activity intolerance will decrease Outcome: Progressing   Problem: Nutrition: Goal: Adequate nutrition will be maintained Outcome: Progressing   Problem: Coping: Goal: Level of anxiety will decrease Outcome: Progressing   Problem: Pain Managment: Goal: General experience of comfort will improve Outcome: Progressing   Problem: Safety: Goal: Ability to remain free from injury will improve Outcome: Progressing

## 2018-05-24 NOTE — Consult Note (Signed)
Infectious Disease     Reason for Consult  E coli bacteremia    Referring Physician: Doylene Canning Date of Admission:  05/22/2018   Active Problems:   Sepsis ([place])   HPI: Brenda [name] is a 48 y.o. female with hx depression and substance abuse admitted with worsening back pain and SOB. She was recently hospitalized from 4/3-4/5 with sepsis secondary to community-acquired pneumonia.  She was treated with IV ceftriaxone and azithromycin and was then transitioned to Keflex and doxycycline on discharge.  She was also given prednisone for a mild asthma exacerbation.  COVID testing from that hospitalization was negative.  Patient states that she took her antibiotics and prednisone as prescribed, but her shortness of breath never improved. She had also complained of some increasing dysuria.  On admission her temp was 100.9 and wbc 23.  Cr 2.6->2.1, UA> 50 wbc  CXR was negative but CT stone protocol showed extensive pyelonephritis.  She was started on initially cetriaxone and azitrho but bcx + E coli so changed to meropenem.  FU bcx neg.  She reports some improvement but still with back pain.  No fevers, wbc down to 10. UCX mixed.   HIV neg, Hep C +  Past Medical History:  Diagnosis Date  . Depression   . Polysubstance abuse [facility])    Past Surgical History:  Procedure Laterality Date  . TUBAL LIGATION     Social History   Tobacco Use  . Smoking status: Current Every Day Smoker    Packs/day: 1.00    Years: 30.00    Pack years: 30.00    Types: Cigarettes  . Smokeless tobacco: Never Used  Substance Use Topics  . Alcohol use: No  . Drug use: Yes    Frequency: 7.0 times per week    Types: Cocaine    Comment: Pt also states sometimes Heroin   History reviewed. No pertinent family history.  Allergies:  Allergies  Allergen Reactions  . Flexeril [Cyclobenzaprine] Other (See Comments)    RLS-like symptoms   . Seroquel [Quetiapine Fumarate] Other (See Comments)    RLS-like symptoms     Current antibiotics: Antibiotics Given (last 72 hours)    Date/Time Action Medication Dose Rate   05/22/18 1719 New Bag/Given   cefTRIAXone (ROCEPHIN) 1 g in sodium chloride 0.9 % 100 mL IVPB 1 g 200 mL/hr   05/22/18 1749 New Bag/Given   azithromycin (ZITHROMAX) 500 mg in sodium chloride 0.9 % 250 mL IVPB 500 mg 250 mL/hr   05/22/18 2250 New Bag/Given   ceFEPIme (MAXIPIME) 2 g in sodium chloride 0.9 % 100 mL IVPB 2 g 200 mL/hr   05/23/18 0533 New Bag/Given   meropenem (MERREM) 1 g in sodium chloride 0.9 % 100 mL IVPB 1 g 200 mL/hr   05/23/18 1510 New Bag/Given   meropenem (MERREM) 1 g in sodium chloride 0.9 % 100 mL IVPB 1 g 200 mL/hr   05/24/18 0514 New Bag/Given   meropenem (MERREM) 1 g in sodium chloride 0.9 % 100 mL IVPB 1 g 200 mL/hr   05/24/18 1602 New Bag/Given   meropenem (MERREM) 1 g in sodium chloride 0.9 % 100 mL IVPB 1 g 200 mL/hr      MEDICATIONS: . calcium carbonate  1 tablet Oral BID WC  . cyclobenzaprine  5 mg Oral TID  . diltiazem  120 mg Oral Daily  . enoxaparin (LOVENOX) injection  40 mg Subcutaneous Q24H  . lidocaine  1 patch Transdermal Q24H  . nicotine  14 mg Transdermal Daily  . QUEtiapine  50 mg Oral QHS    Review of Systems - 11 systems reviewed and negative per HPI   OBJECTIVE: Temp:  [98.5 F (36.9 C)-98.9 F (37.2 C)] 98.5 F (36.9 C) (04/27 1204) Pulse Rate:  [101-126] 101 (04/27 1204) Resp:  [18-20] 18 (04/27 1204) BP: (115-126)/(84-85) 116/84 (04/27 1204) SpO2:  [95 %-98 %] 97 % (04/27 1204) Physical Exam  Constitutional:  Disheveled, sleepy, but oriented to person, place, and time.  HENT: [place]/AT, PERRLA, no scleral icterus Mouth/Throat: Oropharynx is clear and moist. No oropharyngeal exudate.  Cardiovascular: Normal rate, regular rhythm and normal heart sounds.  Pulmonary/Chest: Effort normal and breath sounds normal. No respiratory distress.  has no wheezes.  Neck = supple, no nuchal rigidity Abdominal: Soft. Bowel sounds are  normal.  exhibits no distension. There is no tenderness.  Mild Bil CVAT Lymphadenopathy: no cervical adenopathy. No axillary adenopathy Neurological: alert and oriented to person, place, and time.  Skin: Skin is warm and dry. No rash noted. No erythema.  Psychiatric: a normal mood and affect.  behavior is normal.    LABS: Results for orders placed or performed during the hospital encounter of 05/22/18 (from the past 48 hour(s))  C-reactive protein     Status: Abnormal   Collection Time: 05/22/18  8:35 PM  Result Value Ref Range   CRP 36.0 (H) <1.0 mg/dL    Comment: Performed at [facility], [address], [place], [place] [address]  HIV antibody (Routine Testing)     Status: None   Collection Time: 05/22/18  8:35 PM  Result Value Ref Range   HIV Screen 4th Generation wRfx Non Reactive Non Reactive    Comment: (NOTE) Performed At: [facility] [place], [place] [id number] Rush Farmer MD [id number]   Hepatitis panel, acute     Status: Abnormal   Collection Time: 05/22/18  8:35 PM  Result Value Ref Range   Hepatitis B Surface Ag Negative Negative   HCV Ab >11.0 (H) 0.0 - 0.9 s/co ratio    Comment: (NOTE)                                  Negative:     < 0.8                             Indeterminate: 0.8 - 0.9                                  Positive:     > 0.9 The CDC recommends that a positive HCV antibody result be followed up with a HCV Nucleic Acid Amplification test (280034). Performed At: [facility] [place], [place] [id number] Rush Farmer MD [id number]    Hep A IgM Negative Negative   Hep B C IgM Negative Negative  Basic metabolic panel     Status: Abnormal   Collection Time: 05/22/18  8:42 PM  Result Value Ref Range   Sodium 136 135 - 145 mmol/L   Potassium 4.1 3.5 - 5.1 mmol/L   Chloride 102 98 - 111 mmol/L   CO2 20 (L) 22 - 32 mmol/L   Glucose, Bld 116 (H) 70 - 99 mg/dL   BUN 31 (H) 6 - 20 mg/dL  Creatinine, Ser 2.22 (H) 0.44 - 1.00 mg/dL   Calcium 7.2 (L) 8.9 - 10.3 mg/dL   GFR calc non Af Amer 26 (L) >60 mL/min   GFR calc Af Amer 30 (L) >60 mL/min   Anion gap 14 5 - 15    Comment: Performed at [facility], [address] [place], [place] [address]  CBC     Status: Abnormal   Collection Time: 05/22/18  8:42 PM  Result Value Ref Range   WBC 16.9 (H) 4.0 - 10.5 K/uL   RBC 4.90 3.87 - 5.11 MIL/uL   Hemoglobin 9.8 (L) 12.0 - 15.0 g/dL   HCT 32.5 (L) 36.0 - 46.0 %   MCV 66.3 (L) 80.0 - 100.0 fL   MCH 20.0 (L) 26.0 - 34.0 pg   MCHC 30.2 30.0 - 36.0 g/dL   RDW 19.9 (H) 11.5 - 15.5 %   Platelets 247 150 - 400 K/uL   nRBC 0.0 0.0 - 0.2 %    Comment: Performed at [facility], [address] [place], [place] [address]  Procalcitonin     Status: None   Collection Time: 05/22/18  8:42 PM  Result Value Ref Range   Procalcitonin 33.24 ng/mL    Comment:        Interpretation: PCT >= 10 ng/mL: Important systemic inflammatory response, almost exclusively due to severe bacterial sepsis or septic shock. (NOTE)       Sepsis PCT Algorithm           Lower Respiratory Tract                                      Infection PCT Algorithm    ----------------------------     ----------------------------         PCT < 0.25 ng/mL                PCT < 0.10 ng/mL         Strongly encourage             Strongly discourage   discontinuation of antibiotics    initiation of antibiotics    ----------------------------     -----------------------------       PCT 0.25 - 0.50 ng/mL            PCT 0.10 - 0.25 ng/mL               OR       >80% decrease in PCT            Discourage initiation of                                            antibiotics      Encourage discontinuation           of antibiotics    ----------------------------     -----------------------------         PCT >= 0.50 ng/mL              PCT 0.26 - 0.50 ng/mL                AND       <80% decrease in PCT              Encourage initiation of  antibiotics       Encourage continuation           of antibiotics    ----------------------------     -----------------------------        PCT >= 0.50 ng/mL                  PCT > 0.50 ng/mL               AND         increase in PCT                  Strongly encourage                                      initiation of antibiotics    Strongly encourage escalation           of antibiotics                                     -----------------------------                                           PCT <= 0.25 ng/mL                                                 OR                                        > 80% decrease in PCT                                     Discontinue / Do not initiate                                             antibiotics Performed at [facility], [address] [place], [place] [address]   Troponin I - Now Then Q6H     Status: None   Collection Time: 05/22/18  8:42 PM  Result Value Ref Range   Troponin I <0.03 <0.03 ng/mL    Comment: Performed at [facility], [address] [place], [place] [address]  Lactic acid, plasma     Status: None   Collection Time: 05/23/18  3:35 AM  Result Value Ref Range   Lactic Acid, Venous 1.3 0.5 - 1.9 mmol/L    Comment: Performed at [facility], [address] [place], [place] [address]  Basic metabolic panel     Status: Abnormal   Collection Time: 05/23/18  6:29 AM  Result Value Ref Range   Sodium 135 135 - 145 mmol/L   Potassium 3.6 3.5 - 5.1 mmol/L   Chloride 107 98 - 111 mmol/L   CO2 19 (L) 22 - 32 mmol/L   Glucose, Bld 91 70 -  99 mg/dL   BUN 38 (H) 6 - 20 mg/dL   Creatinine, Ser 2.61 (H) 0.44 - 1.00 mg/dL   Calcium 6.8 (L) 8.9 - 10.3 mg/dL   GFR calc non Af Amer 21 (L) >60 mL/min   GFR calc Af Amer 25 (L) >60 mL/min   Anion gap 9 5 - 15    Comment: Performed at [facility], [address]  [place], [place] [address]  Troponin I -     Status: None   Collection Time: 05/23/18  6:29 AM  Result Value Ref Range   Troponin I <0.03 <0.03 ng/mL    Comment: Performed at [facility], [address] [place], [place] [address]  Iron and TIBC     Status: Abnormal   Collection Time: 05/23/18  6:29 AM  Result Value Ref Range   Iron 5 (L) 28 - 170 ug/dL   TIBC 293 250 - 450 ug/dL   Saturation Ratios 2 (L) 10.4 - 31.8 %   UIBC 288 ug/dL    Comment: Performed at [facility], [address] [place], [place] [address]  Troponin I -     Status: None   Collection Time: 05/23/18  1:38 PM  Result Value Ref Range   Troponin I <0.03 <0.03 ng/mL    Comment: Performed at [facility], [address] [place], [place] [address]  Culture, blood (single) w Reflex to ID Panel     Status: None (Preliminary result)   Collection Time: 05/24/18  4:02 AM  Result Value Ref Range   Specimen Description BLOOD LEFT ANTECUBITAL    Special Requests      BOTTLES DRAWN AEROBIC AND ANAEROBIC Blood Culture adequate volume   Culture      NO GROWTH < 12 HOURS Performed at [facility], [address] [place], [place] [address]    Report Status PENDING   CBC     Status: Abnormal   Collection Time: 05/24/18  4:02 AM  Result Value Ref Range   WBC 10.4 4.0 - 10.5 K/uL   RBC 4.64 3.87 - 5.11 MIL/uL   Hemoglobin 9.4 (L) 12.0 - 15.0 g/dL   HCT 30.7 (L) 36.0 - 46.0 %   MCV 66.2 (L) 80.0 - 100.0 fL   MCH 20.3 (L) 26.0 - 34.0 pg   MCHC 30.6 30.0 - 36.0 g/dL   RDW 20.2 (H) 11.5 - 15.5 %   Platelets 168 150 - 400 K/uL   nRBC 0.2 0.0 - 0.2 %    Comment: Performed at [facility], [address] [place], [place] [address]  Comprehensive metabolic panel     Status: Abnormal   Collection Time: 05/24/18  4:02 AM  Result Value Ref Range   Sodium 134 (L) 135 - 145 mmol/L   Potassium 3.1 (L) 3.5 - 5.1 mmol/L   Chloride 106 98 - 111 mmol/L   CO2 19 (L) 22 - 32 mmol/L    Glucose, Bld 93 70 - 99 mg/dL   BUN 34 (H) 6 - 20 mg/dL   Creatinine, Ser 2.15 (H) 0.44 - 1.00 mg/dL   Calcium 7.3 (L) 8.9 - 10.3 mg/dL   Total Protein 5.7 (L) 6.5 - 8.1 g/dL   Albumin 2.2 (L) 3.5 - 5.0 g/dL   AST 31 15 - 41 U/L   ALT 34 0 - 44 U/L   Alkaline Phosphatase 78 38 - 126 U/L   Total Bilirubin 0.9 0.3 - 1.2 mg/dL  GFR calc non Af Amer 27 (L) >60 mL/min   GFR calc Af Amer 31 (L) >60 mL/min   Anion gap 9 5 - 15    Comment: Performed at [facility], [address] [place], [place] [address]  Lactic acid, plasma     Status: None   Collection Time: 05/24/18  4:02 AM  Result Value Ref Range   Lactic Acid, Venous 1.1 0.5 - 1.9 mmol/L    Comment: Performed at [facility], [address] [place], [place] [address]  Procalcitonin     Status: None   Collection Time: 05/24/18  4:02 AM  Result Value Ref Range   Procalcitonin 33.90 ng/mL    Comment:        Interpretation: PCT >= 10 ng/mL: Important systemic inflammatory response, almost exclusively due to severe bacterial sepsis or septic shock. (NOTE)       Sepsis PCT Algorithm           Lower Respiratory Tract                                      Infection PCT Algorithm    ----------------------------     ----------------------------         PCT < 0.25 ng/mL                PCT < 0.10 ng/mL         Strongly encourage             Strongly discourage   discontinuation of antibiotics    initiation of antibiotics    ----------------------------     -----------------------------       PCT 0.25 - 0.50 ng/mL            PCT 0.10 - 0.25 ng/mL               OR       >80% decrease in PCT            Discourage initiation of                                            antibiotics      Encourage discontinuation           of antibiotics    ----------------------------     -----------------------------         PCT >= 0.50 ng/mL              PCT 0.26 - 0.50 ng/mL                AND       <80% decrease in PCT              Encourage initiation of                                             antibiotics       Encourage continuation           of antibiotics    ----------------------------     -----------------------------        PCT >= 0.50 ng/mL  PCT > 0.50 ng/mL               AND         increase in PCT                  Strongly encourage                                      initiation of antibiotics    Strongly encourage escalation           of antibiotics                                     -----------------------------                                           PCT <= 0.25 ng/mL                                                 OR                                        > 80% decrease in PCT                                     Discontinue / Do not initiate                                             antibiotics Performed at [facility], [address] [place], [place] [address]    No components found for: ESR, C REACTIVE PROTEIN MICRO: Recent Results (from the past 720 hour(s))  Novel Coronavirus, NAA (hospital order; send-out to ref lab)     Status: None   Collection Time: 04/30/18 11:13 PM  Result Value Ref Range Status   SARS-CoV-2, NAA NOT DETECTED NOT DETECTED Final    Comment: Performed at [facility] Performed at [facility], [address], [place], [place] [address]    Coronavirus Source NASOPHARYNGEAL  Final    Comment: Performed at [facility], [address] [place], [place] [address]  Blood Culture (routine x 2)     Status: None   Collection Time: 04/30/18 11:15 PM  Result Value Ref Range Status   Specimen Description BLOOD RIGHT WRIST  Final   Special Requests   Final    BOTTLES DRAWN AEROBIC AND ANAEROBIC Blood Culture results may not be optimal due to an excessive volume of blood received in culture bottles   Culture   Final    NO GROWTH 5 DAYS Performed at [facility], [address], [place], [place]  [address]    Report Status 05/05/2018 FINAL  Final  Blood Culture (routine x 2)     Status: None  Collection Time: 04/30/18 11:15 PM  Result Value Ref Range Status   Specimen Description BLOOD LEFT HAND  Final   Special Requests   Final    BOTTLES DRAWN AEROBIC AND ANAEROBIC Blood Culture results may not be optimal due to an excessive volume of blood received in culture bottles   Culture   Final    NO GROWTH 5 DAYS Performed at [facility], [address] [place], [place] [address]    Report Status 05/05/2018 FINAL  Final  Blood Culture (routine x 2)     Status: None (Preliminary result)   Collection Time: 05/22/18  3:27 PM  Result Value Ref Range Status   Specimen Description BLOOD LEFT FOREARM  Final   Special Requests   Final    BOTTLES DRAWN AEROBIC AND ANAEROBIC Blood Culture results may not be optimal due to an inadequate volume of blood received in culture bottles   Culture  Setup Time   Final    GRAM NEGATIVE RODS IN BOTH AEROBIC AND ANAEROBIC BOTTLES CRITICAL RESULT CALLED TO, READ BACK BY AND VERIFIED WITH: Katalena Malveaux BESANTI AT 0500 ON 05/23/18 RWW Performed at [facility], [address], [place], [place] [address]    Culture GRAM NEGATIVE RODS  Final   Report Status PENDING  Incomplete  Blood Culture (routine x 2)     Status: Abnormal (Preliminary result)   Collection Time: 05/22/18  3:28 PM  Result Value Ref Range Status   Specimen Description   Final    BLOOD RIGHT ANTECUBITAL Performed at [facility], [place] [address] [place], [place] [address]    Special Requests   Final    BOTTLES DRAWN AEROBIC AND ANAEROBIC Blood Culture adequate volume Performed at [facility], [address] [place], [place] [address]    Culture  Setup Time   Final    Organism ID to follow GRAM NEGATIVE RODS IN BOTH AEROBIC AND ANAEROBIC BOTTLES CRITICAL RESULT CALLED TO, READ BACK BY AND VERIFIED WITH: Son[place]ra ON 05/23/18 RWW Performed at  [facility], [address] [place], [place] [address]    Culture (A)  Final    ESCHERICHIA COLI SUSCEPTIBILITIES TO FOLLOW Performed at [facility], [place] [address], [place], [place] [address]    Report Status PENDING  Incomplete  Urine culture     Status: None   Collection Time: 05/22/18  3:28 PM  Result Value Ref Range Status   Specimen Description   Final    URINE, RANDOM Performed at [facility], [address] [place], [place] [address]    Special Requests   Final    NONE Performed at [facility], [address] [place], [place] [address]    Culture   Final    Multiple bacterial morphotypes present, none predominant. Suggest appropriate recollection if clinically indicated.   Report Status 05/24/2018 FINAL  Final  SARS Coronavirus 2 [facility] order, Performed in [place] hospital lab)     Status: None   Collection Time: 05/22/18  3:28 PM  Result Value Ref Range Status   SARS Coronavirus 2 NEGATIVE NEGATIVE Final    Comment: (NOTE) If result is NEGATIVE SARS-CoV-2 target nucleic acids are NOT DETECTED. The SARS-CoV-2 RNA is generally detectable in upper and lower  respiratory specimens during the acute phase of infection. The lowest  concentration of SARS-CoV-2 viral copies this assay can detect is 250  copies / mL. A negative result does not  preclude SARS-CoV-2 infection  and should not be used as the sole basis for treatment or other  patient management decisions.  A negative result may occur with  improper specimen collection / handling, submission of specimen other  than nasopharyngeal swab, presence of viral mutation(s) within the  areas targeted by this assay, and inadequate number of viral copies  (<250 copies / mL). A negative result must be combined with clinical  observations, patient history, and epidemiological information. If result is POSITIVE SARS-CoV-2 target nucleic acids are DETECTED. The SARS-CoV-2 RNA is  generally detectable in upper and lower  respiratory specimens dur ing the acute phase of infection.  Positive  results are indicative of active infection with SARS-CoV-2.  Clinical  correlation with patient history and other diagnostic information is  necessary to determine patient infection status.  Positive results do  not rule out bacterial infection or co-infection with other viruses. If result is PRESUMPTIVE POSTIVE SARS-CoV-2 nucleic acids MAY BE PRESENT.   A presumptive positive result was obtained on the submitted specimen  and confirmed on repeat testing.  While 2019 novel coronavirus  (SARS-CoV-2) nucleic acids may be present in the submitted sample  additional confirmatory testing may be necessary for epidemiological  and / or clinical management purposes  to differentiate between  SARS-CoV-2 and other Sarbecovirus currently known to infect humans.  If clinically indicated additional testing with an alternate test  methodology [phone]) is advised. The SARS-CoV-2 RNA is generally  detectable in upper and lower respiratory sp ecimens during the acute  phase of infection. The expected result is Negative. Fact Sheet for Patients:  StrictlyIdeas.no Fact Sheet for Healthcare Providers: BankingDealers.co.za This test is not yet approved or cleared by the [place] FDA and has been authorized for detection and/or diagnosis of SARS-CoV-2 by FDA under an Emergency Use Authorization (EUA).  This EUA will remain in effect (meaning this test can be used) for the duration of the COVID-19 declaration under Section 564(b)(1) of the Act, 21 U.S.C. section 360bbb-3(b)(1), unless the authorization is terminated or revoked sooner. Performed at [facility], [address] [place], [place] [address]   Blood Culture ID Panel (Reflexed)     Status: Abnormal   Collection Time: 05/22/18  3:28 PM  Result Value Ref Range Status    Enterococcus species NOT DETECTED NOT DETECTED Final   Listeria monocytogenes NOT DETECTED NOT DETECTED Final   Staphylococcus species NOT DETECTED NOT DETECTED Final   Staphylococcus aureus (BCID) NOT DETECTED NOT DETECTED Final   Streptococcus species NOT DETECTED NOT DETECTED Final   Streptococcus agalactiae NOT DETECTED NOT DETECTED Final   Streptococcus pneumoniae NOT DETECTED NOT DETECTED Final   Streptococcus pyogenes NOT DETECTED NOT DETECTED Final   Acinetobacter baumannii NOT DETECTED NOT DETECTED Final   Enterobacteriaceae species DETECTED (A) NOT DETECTED Final    Comment: Enterobacteriaceae represent a large family of gram-negative bacteria, not a single organism. CRITICAL RESULT CALLED TO, READ BACK BY AND VERIFIED WITH: DAVAD BESANTI AT 0500 ON 05/23/18 RWW    Enterobacter cloacae complex NOT DETECTED NOT DETECTED Final   Escherichia coli DETECTED (A) NOT DETECTED Final    Comment: CRITICAL RESULT CALLED TO, READ BACK BY AND VERIFIED WITH: Raesha Coonrod BESANTI AT 0500 ON 05/23/18 RWW    Klebsiella oxytoca NOT DETECTED NOT DETECTED Final   Klebsiella pneumoniae NOT DETECTED NOT DETECTED Final   Proteus species NOT DETECTED NOT DETECTED Final   Serratia marcescens NOT DETECTED NOT DETECTED Final   Carbapenem resistance NOT  DETECTED NOT DETECTED Final   Haemophilus influenzae NOT DETECTED NOT DETECTED Final   Neisseria meningitidis NOT DETECTED NOT DETECTED Final   Pseudomonas aeruginosa NOT DETECTED NOT DETECTED Final   Candida albicans NOT DETECTED NOT DETECTED Final   Candida glabrata NOT DETECTED NOT DETECTED Final   Candida krusei NOT DETECTED NOT DETECTED Final   Candida parapsilosis NOT DETECTED NOT DETECTED Final   Candida tropicalis NOT DETECTED NOT DETECTED Final    Comment: Performed at [facility], [address] [place], [place] [address]  Culture, blood (single) w Reflex to ID Panel     Status: None (Preliminary result)   Collection Time: 05/24/18  4:02  AM  Result Value Ref Range Status   Specimen Description BLOOD LEFT ANTECUBITAL  Final   Special Requests   Final    BOTTLES DRAWN AEROBIC AND ANAEROBIC Blood Culture adequate volume   Culture   Final    NO GROWTH < 12 HOURS Performed at [facility], [address], [place], [place] [address]    Report Status PENDING  Incomplete    IMAGING: Dg Chest 2 View  Result Date: 05/23/2018 CLINICAL DATA:  Smoking history.  Shortness of breath. EXAM: CHEST - 2 VIEW COMPARISON:  May 22, 2018 FINDINGS: The heart size and mediastinal contours are within normal limits. Both lungs are clear. The visualized skeletal structures are unremarkable. IMPRESSION: No active cardiopulmonary disease. Electronically Signed   By: Dorise Bullion III M.D   On: 05/23/2018 07:11   US Renal  Result Date: 05/23/2018 CLINICAL DATA:  UTI EXAM: RENAL / URINARY TRACT ULTRASOUND COMPLETE COMPARISON:  None. FINDINGS: Right Kidney: Renal measurements: 13.2 x 5.7 x 5.3 cm = volume: 214 mL. Cortical echogenicity is normal. No hydronephrosis or shadowing stone. Left Kidney: Renal measurements: 15.1 x 8.6 x 6.5 cm = volume: 460 mL. No hydronephrosis or shadowing stone. Upper pole appears slightly echogenic. Bladder: Appears normal for degree of bladder distention. IMPRESSION: 1. Negative for hydronephrosis. 2. Suggestion of slightly echogenic upper pole left kidney, can be seen in the setting of pyelonephritis. Electronically Signed   By: Donavan Foil M.D.   On: 05/23/2018 15:44   Dg Chest Port 1 View  Result Date: 05/22/2018 CLINICAL DATA:  Short of breath and cough EXAM: PORTABLE CHEST 1 VIEW COMPARISON:  04/30/2018 FINDINGS: Normal heart size. Lungs clear. No pneumothorax. No pleural effusion. IMPRESSION: No active disease. Electronically Signed   By: Marybelle Killings M.D.   On: 05/22/2018 16:20   Dg Chest Portable 1 View  Result Date: 04/30/2018 CLINICAL DATA:  48 year old female with cough and shortness of breath.  EXAM: PORTABLE CHEST 1 VIEW COMPARISON:  Chest radiograph dated 05/01/2016 FINDINGS: Bilateral perihilar patchy areas of hazy airspace density noted which may represent edema versus pneumonia. Clinical correlation is recommended. There is no pleural effusion or pneumothorax. The cardiac silhouette is within normal limits. No acute osseous pathology. IMPRESSION: Findings likely represent edema although pneumonia is not excluded. Clinical correlation is recommended. Electronically Signed   By: Anner Crete M.D.   On: 04/30/2018 20:09   Ct Renal Stone Study  Result Date: 05/24/2018 CLINICAL DATA:  47 year old female with history of sepsis. Urinary tract infection. Flank pain. Evaluate for obstructing stone or pyelonephritis. EXAM: CT ABDOMEN AND PELVIS WITHOUT CONTRAST TECHNIQUE: Multidetector CT imaging of the abdomen and pelvis was performed following the standard protocol without IV contrast. COMPARISON:  No priors. FINDINGS: Lower chest: Small right pleural effusion lying dependently with some associated passive  subsegmental atelectasis in the right lung base. Hepatobiliary: Diffuse low attenuation throughout the hepatic parenchyma, indicative of hepatic steatosis. No discrete cystic or solid hepatic lesions are confidently identified on today's noncontrast CT examination. No definite calcified gallstones within the lumen of the gallbladder. Gallbladder does appear distended, with thickening and edema in the gallbladder wall which measures up to 14 mm in thickness. Pancreas: No definite pancreatic mass or peripancreatic fluid collections. Mild diffuse haziness throughout the retroperitoneal fat. Spleen: Unremarkable. Adrenals/Urinary Tract: No calculi are identified within the collecting system of either kidney, along the course of either ureter, or within the lumen of the urinary bladder. No hydroureteronephrosis. Extensive perinephric and periureteric stranding bilaterally. Urinary bladder is grossly  unremarkable in appearance. Stomach/Bowel: Unenhanced appearance of the stomach is normal. No pathologic dilatation of small bowel or colon. Normal appendix. Vascular/Lymphatic: Aortic atherosclerosis. No lymphadenopathy noted in the abdomen or pelvis. Reproductive: Uterus and ovaries are unremarkable in appearance. Other: Small volume of ascites.  No pneumoperitoneum. Musculoskeletal: There are no aggressive appearing lytic or blastic lesions noted in the visualized portions of the skeleton. IMPRESSION: 1. No urinary tract calculi. No findings of urinary tract obstruction. 2. Extensive bilateral perinephric and periureteric soft tissue stranding with edema throughout the retroperitoneum. These findings are nonspecific, but clinical correlation for signs and symptoms of pyelonephritis is suggested. 3. Gallbladder appears moderately distended with extensive mural thickening and edema. No calcified gallstones are identified. If there is clinical concern for acute cholecystitis, further evaluation with right upper quadrant ultrasound should be considered. 4. Hepatic steatosis. 5. Small right pleural effusion lying dependently. Electronically Signed   By: Vinnie Langton M.D.   On: 05/24/2018 09:59    Assessment:   Brenda [name] is a 48 y.o. female with depression and hx of substance abuse with recent admission for CAP now with evidence of pyelonephritis and E coli bacteremia. No evidence of stones. Has ARF with cr up to 2.6. No evidence of obsturction or stones.  Clinically improving with meropenem. HIV neg, Hep C +   Recommendations Pyelonephritis with E coli bacteremia. Continue meropenem until sensitivities return. Hopefully can treat with oral regimen.   Hep C - check HCV PCR to confirm active infection. If + will need outpt fu with GI or ID for evaluation and treatment.   Thank you very much for allowing me to participate in the care of this patient. Please call with questions.   Cheral Marker.  Ola Spurr, MD

## 2018-05-24 NOTE — Progress Notes (Signed)
MD notifiedDo you be able to order lidocaine patch for lower back pain for this patient. Potassium is 3.1 this morning, calcium is 7.3.

## 2018-05-24 NOTE — Progress Notes (Signed)
[facility] [place] [facility]   PATIENT NAME: Catina Zagorski    MR#:  [id number]  DATE OF BIRTH:  04/02/1970  SUBJECTIVE:  CHIEF COMPLAINT:   Chief Complaint  Patient presents with  . Shortness of Breath   Came with significant lower back pain with nausea and not able to eat or drink much and getting short of breath for last few days to a week.  Noted to be septic and found to have gram-negative bacteremia.  Still feels the same pain.  Less nausea today.  REVIEW OF SYSTEMS:  CONSTITUTIONAL: Have fever, fatigue or weakness.  EYES: No blurred or double vision.  EARS, NOSE, AND THROAT: No tinnitus or ear pain.  RESPIRATORY: No cough, shortness of breath, wheezing or hemoptysis.  CARDIOVASCULAR: No chest pain, orthopnea, edema.  GASTROINTESTINAL: Have nausea, vomiting, no diarrhea or abdominal pain.  GENITOURINARY: No dysuria, hematuria.  ENDOCRINE: No polyuria, nocturia,  HEMATOLOGY: No anemia, easy bruising or bleeding SKIN: No rash or lesion. MUSCULOSKELETAL: No joint pain or arthritis.  Have bilateral lower back pain. NEUROLOGIC: No tingling, numbness, weakness.  PSYCHIATRY: No anxiety or depression.   ROS  DRUG ALLERGIES:   Allergies  Allergen Reactions  . Flexeril [Cyclobenzaprine] Other (See Comments)    RLS-like symptoms   . Seroquel [Quetiapine Fumarate] Other (See Comments)    RLS-like symptoms    VITALS:  Blood pressure 116/84, pulse (!) 101, temperature 98.5 F (36.9 C), temperature source Oral, resp. rate 18, height 5\' 2"  (1.575 m), weight 91.2 kg, SpO2 97 %.  PHYSICAL EXAMINATION:  GENERAL:  48 y.o.-year-old patient lying in the bed with acute distress due to pain.  EYES: Pupils equal, round, reactive to light and accommodation. No scleral icterus. Extraocular muscles intact.  HEENT: Head atraumatic, normocephalic. Oropharynx and nasopharynx clear.  NECK:  Supple, no jugular venous distention. No thyroid enlargement, no tenderness.   LUNGS: Normal breath sounds bilaterally, no wheezing, rales,rhonchi or crepitation. No use of accessory muscles of respiration.  CARDIOVASCULAR: S1, S2 normal. No murmurs, rubs, or gallops.  ABDOMEN: Soft, nontender, nondistended. Bowel sounds present. No organomegaly or mass.  Tender on lower back on the sides. EXTREMITIES: No pedal edema, cyanosis, or clubbing.  NEUROLOGIC: Cranial nerves II through XII are intact. Muscle strength 5/5 in all extremities. Sensation intact. Gait not checked.  PSYCHIATRIC: The patient is alert and oriented x 3.  SKIN: No obvious rash, lesion, or ulcer.   Physical Exam LABORATORY PANEL:   CBC Recent Labs  Lab 05/24/18 0402  WBC 10.4  HGB 9.4*  HCT 30.7*  PLT 168   ------------------------------------------------------------------------------------------------------------------  Chemistries  Recent Labs  Lab 05/24/18 0402  NA 134*  K 3.1*  CL 106  CO2 19*  GLUCOSE 93  BUN 34*  CREATININE 2.15*  CALCIUM 7.3*  AST 31  ALT 34  ALKPHOS 78  BILITOT 0.9   ------------------------------------------------------------------------------------------------------------------  Cardiac Enzymes Recent Labs  Lab 05/23/18 0629 05/23/18 1338  TROPONINI <0.03 <0.03   ------------------------------------------------------------------------------------------------------------------  RADIOLOGY:  Dg Chest 2 View  Result Date: 05/23/2018 CLINICAL DATA:  Smoking history.  Shortness of breath. EXAM: CHEST - 2 VIEW COMPARISON:  May 22, 2018 FINDINGS: The heart size and mediastinal contours are within normal limits. Both lungs are clear. The visualized skeletal structures are unremarkable. IMPRESSION: No active cardiopulmonary disease. Electronically Signed   By: Gerome Sam III M.D   On: 05/23/2018 07:11   US Renal  Result Date: 05/23/2018 CLINICAL DATA:  UTI EXAM: RENAL /  URINARY TRACT ULTRASOUND COMPLETE COMPARISON:  None. FINDINGS: Right Kidney:  Renal measurements: 13.2 x 5.7 x 5.3 cm = volume: 214 mL. Cortical echogenicity is normal. No hydronephrosis or shadowing stone. Left Kidney: Renal measurements: 15.1 x 8.6 x 6.5 cm = volume: 460 mL. No hydronephrosis or shadowing stone. Upper pole appears slightly echogenic. Bladder: Appears normal for degree of bladder distention. IMPRESSION: 1. Negative for hydronephrosis. 2. Suggestion of slightly echogenic upper pole left kidney, can be seen in the setting of pyelonephritis. Electronically Signed   By: Jasmine PangKim  Fujinaga M.D.   On: 05/23/2018 15:44   Dg Chest Port 1 View  Result Date: 05/22/2018 CLINICAL DATA:  Short of breath and cough EXAM: PORTABLE CHEST 1 VIEW COMPARISON:  04/30/2018 FINDINGS: Normal heart size. Lungs clear. No pneumothorax. No pleural effusion. IMPRESSION: No active disease. Electronically Signed   By: Jolaine ClickArthur  Hoss M.D.   On: 05/22/2018 16:20   Ct Renal Stone Study  Result Date: 05/24/2018 CLINICAL DATA:  48 year old female with history of sepsis. Urinary tract infection. Flank pain. Evaluate for obstructing stone or pyelonephritis. EXAM: CT ABDOMEN AND PELVIS WITHOUT CONTRAST TECHNIQUE: Multidetector CT imaging of the abdomen and pelvis was performed following the standard protocol without IV contrast. COMPARISON:  No priors. FINDINGS: Lower chest: Small right pleural effusion lying dependently with some associated passive subsegmental atelectasis in the right lung base. Hepatobiliary: Diffuse low attenuation throughout the hepatic parenchyma, indicative of hepatic steatosis. No discrete cystic or solid hepatic lesions are confidently identified on today's noncontrast CT examination. No definite calcified gallstones within the lumen of the gallbladder. Gallbladder does appear distended, with thickening and edema in the gallbladder wall which measures up to 14 mm in thickness. Pancreas: No definite pancreatic mass or peripancreatic fluid collections. Mild diffuse haziness throughout the  retroperitoneal fat. Spleen: Unremarkable. Adrenals/Urinary Tract: No calculi are identified within the collecting system of either kidney, along the course of either ureter, or within the lumen of the urinary bladder. No hydroureteronephrosis. Extensive perinephric and periureteric stranding bilaterally. Urinary bladder is grossly unremarkable in appearance. Stomach/Bowel: Unenhanced appearance of the stomach is normal. No pathologic dilatation of small bowel or colon. Normal appendix. Vascular/Lymphatic: Aortic atherosclerosis. No lymphadenopathy noted in the abdomen or pelvis. Reproductive: Uterus and ovaries are unremarkable in appearance. Other: Small volume of ascites.  No pneumoperitoneum. Musculoskeletal: There are no aggressive appearing lytic or blastic lesions noted in the visualized portions of the skeleton. IMPRESSION: 1. No urinary tract calculi. No findings of urinary tract obstruction. 2. Extensive bilateral perinephric and periureteric soft tissue stranding with edema throughout the retroperitoneum. These findings are nonspecific, but clinical correlation for signs and symptoms of pyelonephritis is suggested. 3. Gallbladder appears moderately distended with extensive mural thickening and edema. No calcified gallstones are identified. If there is clinical concern for acute cholecystitis, further evaluation with right upper quadrant ultrasound should be considered. 4. Hepatic steatosis. 5. Small right pleural effusion lying dependently. Electronically Signed   By: Trudie Reedaniel  Entrikin M.D.   On: 05/24/2018 09:59    ASSESSMENT AND PLAN:   Active Problems:   Sepsis (HCC)  Sepsis- secondary to UTI , pyelonephritis and bacteremia.    Chest x-ray negative. -COVID testing negative -Suspected H CAP on admission but ruled out. -Vancomycin and zosyn-was started and changed to meropenem after getting blood culture with gram-negative rods. -Procalcitonin is high but it could be due to sepsis and  bacteremia -Follow-up blood and urine cultures -Called ID consult. -CT scan and ultrasound on kidneys suggest pyelonephritis but  no obstruction.  Acute hypoxic respiratory failure- requiring 3 L O2 in the ED.  elevated d-dimer, and patient spending a lot of time in bed,  She is on room air now and saturations are fine and appears to be comfortable. -Unable to order CTA chest due to AKI.   On admission there was a consideration to do a CT angiogram of the chest to rule out pulmonary embolism but I do not think that is a strong possibility now as patient is on room air.  I would like to hold off on that testing for now. -Repeat 2 view CXR in the morning appears clear. -Patient is on room air now.  AKI- due to decreased p.o. intake and sepsis -Continue fluids -Avoid nephrotoxic agents as able -Get renal ultrasound to rule out obstruction with presence of UTI and bacteremia. Ultrasound did not show any hydronephrosis or obstruction.  Renal CT confirms bilateral pyelonephritis findings.  Elevated liver transaminases- likely due to sepsis -Hepatitis panel-reports positive HCV antibody.  ID to follow.  Microcytic anemia- Hgb 10.7, ferritin normal. -Monitor -We will get iron studies and give supplemental iron if needed.  Tobacco abuse- smokes 1 pack/day -Nicotine patch as needed    All the records are reviewed and case discussed with Care Management/Social Workerr. Management plans discussed with the patient, family and they are in agreement.  CODE STATUS: Full code.  TOTAL TIME TAKING CARE OF THIS PATIENT: 35 minutes.    POSSIBLE D/C IN 1-2 DAYS, DEPENDING ON CLINICAL CONDITION.   Altamese Dilling M.D on 05/24/2018   Between 7am to 6pm - Pager - [phone]  After 6pm go to [email or web address] - password [email or web address]  [facility]  Office  [phone]  CC: Primary care physician; System, Pcp Not In  Note: This dictation was prepared with Dragon  dictation along with smaller phrase technology. Any transcriptional errors that result from this process are unintentional.

## 2018-05-25 LAB — CBC
HCT: 27.7 % — ABNORMAL LOW (ref 36.0–46.0)
Hemoglobin: 8.4 g/dL — ABNORMAL LOW (ref 12.0–15.0)
MCH: 20 pg — ABNORMAL LOW (ref 26.0–34.0)
MCHC: 30.3 g/dL (ref 30.0–36.0)
MCV: 66.1 fL — ABNORMAL LOW (ref 80.0–100.0)
Platelets: 189 10*3/uL (ref 150–400)
RBC: 4.19 MIL/uL (ref 3.87–5.11)
RDW: 20.2 % — ABNORMAL HIGH (ref 11.5–15.5)
WBC: 10.4 10*3/uL (ref 4.0–10.5)
nRBC: 0 % (ref 0.0–0.2)

## 2018-05-25 LAB — BASIC METABOLIC PANEL
Anion gap: 9 (ref 5–15)
BUN: 26 mg/dL — ABNORMAL HIGH (ref 6–20)
CO2: 17 mmol/L — ABNORMAL LOW (ref 22–32)
Calcium: 7.7 mg/dL — ABNORMAL LOW (ref 8.9–10.3)
Chloride: 110 mmol/L (ref 98–111)
Creatinine, Ser: 1.76 mg/dL — ABNORMAL HIGH (ref 0.44–1.00)
GFR calc Af Amer: 40 mL/min — ABNORMAL LOW (ref >60)
GFR calc non Af Amer: 34 mL/min — ABNORMAL LOW (ref >60)
Glucose, Bld: 77 mg/dL (ref 70–99)
Potassium: 3.5 mmol/L (ref 3.5–5.1)
Sodium: 136 mmol/L (ref 135–145)

## 2018-05-25 LAB — CULTURE, BLOOD (ROUTINE X 2): Special Requests: ADEQUATE

## 2018-05-25 LAB — PROCALCITONIN: Procalcitonin: 17.96 ng/mL

## 2018-05-25 MED ORDER — CIPROFLOXACIN HCL 500 MG PO TABS: 500.0000 mg | ORAL_TABLET | Freq: Two times a day (BID) | ORAL | 0 refills | Status: AC

## 2018-05-25 MED ORDER — OXYCODONE-ACETAMINOPHEN 5-325 MG PO TABS: 1.0000 | ORAL_TABLET | Freq: Four times a day (QID) | ORAL | 0 refills | Status: DC | PRN

## 2018-05-25 MED ORDER — CYCLOBENZAPRINE HCL 5 MG PO TABS: 5.0000 mg | ORAL_TABLET | Freq: Three times a day (TID) | ORAL | 0 refills | Status: DC

## 2018-05-25 MED ORDER — DILTIAZEM HCL ER COATED BEADS 120 MG PO CP24: 120.0000 mg | ORAL_CAPSULE | Freq: Every day | ORAL | 0 refills | Status: DC

## 2018-05-25 MED ORDER — ACETAMINOPHEN 325 MG PO TABS: 650.0000 mg | ORAL_TABLET | Freq: Four times a day (QID) | ORAL | 0 refills | Status: DC | PRN

## 2018-05-25 NOTE — Progress Notes (Signed)
[facility] Date of Admission:  05/22/2018     ID: Brenda [name] is a 48 y.o. female with GNR bacteremia, pyleo Active Problems:   Sepsis (HCC)   Subjective: No fevers, less pain.  ROS  Eleven systems are reviewed and negative except per hpi  Medications:  Antibiotics Given (last 72 hours)    Date/Time Action Medication Dose Rate   05/22/18 1719 New Bag/Given   cefTRIAXone (ROCEPHIN) 1 g in sodium chloride 0.9 % 100 mL IVPB 1 g 200 mL/hr   05/22/18 1749 New Bag/Given   azithromycin (ZITHROMAX) 500 mg in sodium chloride 0.9 % 250 mL IVPB 500 mg 250 mL/hr   05/22/18 2250 New Bag/Given   ceFEPIme (MAXIPIME) 2 g in sodium chloride 0.9 % 100 mL IVPB 2 g 200 mL/hr   05/23/18 0533 New Bag/Given   meropenem (MERREM) 1 g in sodium chloride 0.9 % 100 mL IVPB 1 g 200 mL/hr   05/23/18 1510 New Bag/Given   meropenem (MERREM) 1 g in sodium chloride 0.9 % 100 mL IVPB 1 g 200 mL/hr   05/24/18 0514 New Bag/Given   meropenem (MERREM) 1 g in sodium chloride 0.9 % 100 mL IVPB 1 g 200 mL/hr   05/24/18 1602 New Bag/Given   meropenem (MERREM) 1 g in sodium chloride 0.9 % 100 mL IVPB 1 g 200 mL/hr   05/25/18 0448 New Bag/Given   meropenem (MERREM) 1 g in sodium chloride 0.9 % 100 mL IVPB 1 g 200 mL/hr     . calcium carbonate  1 tablet Oral BID WC  . cyclobenzaprine  5 mg Oral TID  . diltiazem  120 mg Oral Daily  . enoxaparin (LOVENOX) injection  40 mg Subcutaneous Q24H  . lidocaine  1 patch Transdermal Q24H  . nicotine  14 mg Transdermal Daily  . QUEtiapine  50 mg Oral QHS    Objective: Vital signs in last 24 hours: Temp:  [98.1 F (36.7 C)-99 F (37.2 C)] 98.1 F (36.7 C) (04/28 1150) Pulse Rate:  [104-108] 108 (04/28 1150) Resp:  [17-20] 17 (04/28 1150) BP: (108-120)/(67-84) 108/67 (04/28 1150) SpO2:  [95 %-99 %] 95 % (04/28 1150) Constitutional:  Disheveled,oriented to person, place, and time.  HENT: Brenda [name], PERRLA, no scleral  icterus Mouth/Throat: Oropharynx is clear and moist. No oropharyngeal exudate.  Cardiovascular: Normal rate, regular rhythm and normal heart sounds.  Pulmonary/Chest: Effort normal and breath sounds normal. No respiratory distress.  has no wheezes.  Neck = supple, no nuchal rigidity Abdominal: Soft. Bowel sounds are normal.  exhibits no distension. There is no tenderness.  Mild Bil CVAT Lymphadenopathy: no cervical adenopathy. No axillary adenopathy Neurological: alert and oriented to person, place, and time.  Skin: Skin is warm and dry. No rash noted. No erythema.  Psychiatric: a normal mood and affect.  behavior is normal.    Lab Results Recent Labs    05/24/18 0402 05/25/18 0422  WBC 10.4 10.4  HGB 9.4* 8.4*  HCT 30.7* 27.7*  NA 134* 136  K 3.1* 3.5  CL 106 110  CO2 19* 17*  BUN 34* 26*  CREATININE 2.15* 1.76*    Microbiology: @micro @ Studies/Results: Brenda [name]  Result Date: 05/23/2018 CLINICAL DATA:  UTI EXAM: [name] / URINARY TRACT ULTRASOUND COMPLETE COMPARISON:  None. FINDINGS: Right Kidney: [name] measurements: 13.2 x 5.7 x 5.3 cm = volume: 214 mL. Cortical echogenicity is normal. No hydronephrosis or shadowing stone. Left Kidney: [name] measurements: 15.1 x 8.6 x 6.5 cm =  volume: 460 mL. No hydronephrosis or shadowing stone. Upper pole appears slightly echogenic. Bladder: Appears normal for degree of bladder distention. IMPRESSION: 1. Negative for hydronephrosis. 2. Suggestion of slightly echogenic upper pole left kidney, can be seen in the setting of pyelonephritis. Electronically Signed   By: Brenda Pang M.D.   On: 05/23/2018 15:44   Brenda [name]  Result Date: 05/24/2018 CLINICAL DATA:  48 year old female with history of sepsis. Urinary tract infection. Flank pain. Evaluate for obstructing stone or pyelonephritis. EXAM: Brenda ABDOMEN AND PELVIS WITHOUT CONTRAST TECHNIQUE: Multidetector Brenda imaging of the abdomen and pelvis was performed following the standard  protocol without IV contrast. COMPARISON:  No priors. FINDINGS: Lower chest: Small right pleural effusion lying dependently with some associated passive subsegmental atelectasis in the right lung base. Hepatobiliary: Diffuse low attenuation throughout the hepatic parenchyma, indicative of hepatic steatosis. No discrete cystic or solid hepatic lesions are confidently identified on today's noncontrast Brenda examination. No definite calcified gallstones within the lumen of the gallbladder. Gallbladder does appear distended, with thickening and edema in the gallbladder wall which measures up to 14 mm in thickness. Pancreas: No definite pancreatic mass or peripancreatic fluid collections. Mild diffuse haziness throughout the retroperitoneal fat. Spleen: Unremarkable. Adrenals/Urinary Tract: No calculi are identified within the collecting system of either kidney, along the course of either ureter, or within the lumen of the urinary bladder. No hydroureteronephrosis. Extensive perinephric and periureteric stranding bilaterally. Urinary bladder is grossly unremarkable in appearance. Stomach/Bowel: Unenhanced appearance of the stomach is normal. No pathologic dilatation of small bowel or colon. Normal appendix. Vascular/Lymphatic: Aortic atherosclerosis. No lymphadenopathy noted in the abdomen or pelvis. Reproductive: Uterus and ovaries are unremarkable in appearance. Other: Small volume of ascites.  No pneumoperitoneum. Musculoskeletal: There are no aggressive appearing lytic or blastic lesions noted in the visualized portions of the skeleton. IMPRESSION: 1. No urinary tract calculi. No findings of urinary tract obstruction. 2. Extensive bilateral perinephric and periureteric soft tissue stranding with edema throughout the retroperitoneum. These findings are nonspecific, but clinical correlation for signs and symptoms of pyelonephritis is suggested. 3. Gallbladder appears moderately distended with extensive mural thickening  and edema. No calcified gallstones are identified. If there is clinical concern for acute cholecystitis, further evaluation with right upper quadrant ultrasound should be considered. 4. Hepatic steatosis. 5. Small right pleural effusion lying dependently. Electronically Signed   By: Brenda Reed M.D.   On: 05/24/2018 09:59    Assessment/Plan: Brenda [name] is a 48 y.o. female with depression and hx of substance abuse with recent admission for CAP now with evidence of pyelonephritis and E coli bacteremia. No evidence of stones. Has ARF with cr up to 2.6. No evidence of obsturction or stones.  Clinically improving with meropenem. HIV neg, Hep C +  4/28- improving, no fevers. Wants to go home  Recommendations Pyelonephritis with E coli bacteremia. Can dc on cipro for 14 more days  Hep C - Discussed with patient. She has known this before btu never had follow up or treatment. I discussed that she can see [facility] for evaluation even without insurance. I ordered HCV PCR to confirm active infection. If + will need outpt fu with GI or ID for evaluation and treatment.  Thank you very much for the consult. Will follow with you.  Mick Sell   05/25/2018, 12:22 PM

## 2018-05-27 LAB — HEPATITIS C VRS RNA DETECT BY PCR-QUAL: Hepatitis C Vrs RNA by PCR-Qual: POSITIVE — AB

## 2018-05-29 LAB — CULTURE, BLOOD (SINGLE)
Culture: NO GROWTH
Special Requests: ADEQUATE

## 2018-06-02 NOTE — Discharge Summary (Signed)
[facility] [place] at [facility]   PATIENT NAME: Brenda CorkJennifer [name]    MR#:  [id number]  DATE OF BIRTH:  02-Jan-1971  DATE OF ADMISSION:  05/22/2018 ADMITTING PHYSICIAN: Campbell StallKaty Dodd Mayo, MD  DATE OF DISCHARGE: 05/25/2018  2:15 PM  PRIMARY CARE PHYSICIAN: System, Pcp Not In    ADMISSION DIAGNOSIS:  Acute kidney injury (HCC) [N17.9] Severe sepsis (HCC) [A41.9, R65.20] Community acquired pneumonia, unspecified laterality [J18.9]  DISCHARGE DIAGNOSIS:  Active Problems:   Sepsis (HCC)   SECONDARY DIAGNOSIS:   Past Medical History:  Diagnosis Date  . Depression   . Polysubstance abuse [facility](HCC)     HOSPITAL COURSE:   Sepsis-secondary to UTI , pyelonephritis and bacteremia.  Chest x-ray negative. -COVID testing negative -Suspected H CAP on admission but ruled out. -Vancomycin andzosyn-was started and changed to meropenem after getting blood culture with gram-negative rods. -Procalcitonin is high but it could be due to sepsis and bacteremia -Follow-up blood and urine cultures -Called ID consult.- Suggested CIpro oral for 14 days. -CT scan and ultrasound on kidneys suggest pyelonephritis but no obstruction.  Acute hypoxic respiratory failure- requiring 3 L O2 in the ED.  elevated d-dimer, and patient spending a lot of time in bed,  She is on room air now and saturations are fine and appears to be comfortable. -Unable to order CTA chest due to AKI. On admission there was a consideration to do a CT angiogram of the chest to rule out pulmonary embolism but I do not think that is a strong possibility now as patient is on room air.  I would like to hold off on that testing for now. -Repeat 2 view CXR in the morning appears clear. -Patient is on room air now.  AKI-due to decreased p.o. intake and sepsis -Continue fluids -Avoid nephrotoxic agents as able -Get renal ultrasound to rule out obstruction with presence of UTI and bacteremia. Ultrasound did  not show any hydronephrosis or obstruction.  Renal CT confirms bilateral pyelonephritis findings.  Elevated liver transaminases- likely due to sepsis -Hepatitis panel-reports positive HCV antibody.  ID to follow.  advise to follow with GI or ID.  Microcytic anemia- Hgb 10.7, ferritin normal. -Monitor -We will get iron studies and give supplemental iron if needed.  Tobacco abuse-smokes 1 pack/day -Nicotine patch as needed  DISCHARGE CONDITIONS:   Stable.  CONSULTS OBTAINED:  Treatment Team:  Mick SellFitzgerald, David P, MD  DRUG ALLERGIES:   Allergies  Allergen Reactions  . Flexeril [Cyclobenzaprine] Other (See Comments)    RLS-like symptoms   . Seroquel [Quetiapine Fumarate] Other (See Comments)    RLS-like symptoms    DISCHARGE MEDICATIONS:   Allergies as of 05/25/2018      Reactions   Flexeril [cyclobenzaprine] Other (See Comments)   RLS-like symptoms    Seroquel [quetiapine Fumarate] Other (See Comments)   RLS-like symptoms      Medication List    STOP taking these medications   cephALEXin 500 MG capsule Commonly known as:  KEFLEX   doxycycline 100 MG tablet Commonly known as:  VIBRA-TABS     TAKE these medications   acetaminophen 325 MG tablet Commonly known as:  TYLENOL Take 2 tablets (650 mg total) by mouth every 6 (six) hours as needed for mild pain (or Fever >/= 101).   albuterol 108 (90 Base) MCG/ACT inhaler Commonly known as:  VENTOLIN HFA Inhale 2 puffs into the lungs every 4 (four) hours as needed for wheezing or shortness of breath.   ciprofloxacin  500 MG tablet Commonly known as:  Cipro Take 1 tablet (500 mg total) by mouth 2 (two) times daily for 14 days.   cyclobenzaprine 5 MG tablet Commonly known as:  FLEXERIL Take 1 tablet (5 mg total) by mouth 3 (three) times daily.   diltiazem 120 MG 24 hr capsule Commonly known as:  CARDIZEM CD Take 1 capsule (120 mg total) by mouth daily.   guaiFENesin-dextromethorphan 100-10 MG/5ML  syrup Commonly known as:  ROBITUSSIN DM Take 5 mLs by mouth every 4 (four) hours as needed for cough.   oxyCODONE-acetaminophen 5-325 MG tablet Commonly known as:  PERCOCET/ROXICET Take 1 tablet by mouth every 6 (six) hours as needed for moderate pain.        DISCHARGE INSTRUCTIONS:    Follow with PMD in 1-2 weeks.  If you experience worsening of your admission symptoms, develop shortness of breath, life threatening emergency, suicidal or homicidal thoughts you must seek medical attention immediately by calling 911 or calling your MD immediately  if symptoms less severe.  You Must read complete instructions/literature along with all the possible adverse reactions/side effects for all the Medicines you take and that have been prescribed to you. Take any new Medicines after you have completely understood and accept all the possible adverse reactions/side effects.   Please note  You were cared for by a hospitalist during your hospital stay. If you have any questions about your discharge medications or the care you received while you were in the hospital after you are discharged, you can call the unit and asked to speak with the hospitalist on call if the hospitalist that took care of you is not available. Once you are discharged, your primary care physician will handle any further medical issues. Please note that NO REFILLS for any discharge medications will be authorized once you are discharged, as it is imperative that you return to your primary care physician (or establish a relationship with a primary care physician if you do not have one) for your aftercare needs so that they can reassess your need for medications and monitor your lab values.    Today   CHIEF COMPLAINT:   Chief Complaint  Patient presents with  . Shortness of Breath    HISTORY OF PRESENT ILLNESS:  Brenda [name]  is a 48 y.o. female with a known history of depression who presented to the ED with shortness of  breath.  She was recently hospitalized from 4/3-4/5 with sepsis secondary to community-acquired pneumonia.  She was treated with IV ceftriaxone and azithromycin and was then transitioned to Keflex and doxycycline on discharge.  She was also given prednisone for a mild asthma exacerbation.  COVID testing from that hospitalization was negative.  Patient states that she took her antibiotics and prednisone as prescribed, but her shortness of breath never improved.  Her shortness of breath then started to worsen about 1 week ago.  She denies fevers or chills.  She endorses cough productive of yellow and green sputum.  She denies any chest pain or lower extremity edema.  She states she has not been eating and drinking well and has been spending most of her time in bed.  In the ED, she was meeting sepsis criteria with tachycardia to the 120s, tachypnea to the mid 20s, and leukocytosis.  Labs were significant for creatinine 2.31, AST 53, ALT 58, BNP 145, lactic acid 3.5, procalcitonin 35, WBC 23, hemoglobin 10.7.  Repeat COVID test was negative.  D-dimer elevated. Chest x-ray was negative.  She was given a dose of ceftriaxone and azithromycin. Hospitalists were called for admission.    VITAL SIGNS:  Blood pressure 108/67, pulse (!) 108, temperature 98.1 F (36.7 C), temperature source Oral, resp. rate 17, height  (1.575 m), weight 91.2 kg, SpO2 95 %.  I/O:  No intake or output data in the 24 hours ending 06/02/18 5621  PHYSICAL EXAMINATION:  GENERAL:  48 y.o.-year-old patient lying in the bed with no acute distress.  EYES: Pupils equal, round, reactive to light and accommodation. No scleral icterus. Extraocular muscles intact.  HEENT: Head atraumatic, normocephalic. Oropharynx and nasopharynx clear.  NECK:  Supple, no jugular venous distention. No thyroid enlargement, no tenderness.  LUNGS: Normal breath sounds bilaterally, no wheezing, rales,rhonchi or crepitation. No use of accessory muscles of  respiration.  CARDIOVASCULAR: S1, S2 normal. No murmurs, rubs, or gallops.  ABDOMEN: Soft, non-tender, non-distended. Bowel sounds present. No organomegaly or mass.  EXTREMITIES: No pedal edema, cyanosis, or clubbing.  NEUROLOGIC: Cranial nerves II through XII are intact. Muscle strength 5/5 in all extremities. Sensation intact. Gait not checked.  PSYCHIATRIC: The patient is alert and oriented x 3.  SKIN: No obvious rash, lesion, or ulcer.   DATA REVIEW:   CBC No results for input(s): WBC, HGB, HCT, PLT in the last 168 hours.  Chemistries  No results for input(s): NA, K, CL, CO2, GLUCOSE, BUN, CREATININE, CALCIUM, MG, AST, ALT, ALKPHOS, BILITOT in the last 168 hours.  Invalid input(s): GFRCGP  Cardiac Enzymes No results for input(s): TROPONINI in the last 168 hours.  Microbiology Results  Results for orders placed or performed during the hospital encounter of 05/22/18  Blood Culture (routine x 2)     Status: Abnormal   Collection Time: 05/22/18  3:27 PM  Result Value Ref Range Status   Specimen Description   Final    BLOOD LEFT FOREARM Performed at [facility], [address] [place], [place] [address]    Special Requests   Final    BOTTLES DRAWN AEROBIC AND ANAEROBIC Blood Culture results may not be optimal due to an inadequate volume of blood received in culture bottles Performed at [facility], [address] [place], [place] [address]    Culture  Setup Time   Final    GRAM NEGATIVE RODS IN BOTH AEROBIC AND ANAEROBIC BOTTLES CRITICAL RESULT CALLED TO, READ BACK BY AND VERIFIED WITH: DAVID BESANTI AT 0500 ON 05/23/18 RWW Performed at [facility], [address] [place], [place] [address]    Culture (A)  Final    ESCHERICHIA COLI SUSCEPTIBILITIES PERFORMED ON PREVIOUS CULTURE WITHIN THE LAST 5 DAYS. Performed at [facility], [address], [place], [place] [address]    Report Status 05/25/2018 FINAL  Final  Blood Culture  (routine x 2)     Status: Abnormal   Collection Time: 05/22/18  3:28 PM  Result Value Ref Range Status   Specimen Description   Final    BLOOD RIGHT ANTECUBITAL Performed at [facility], [address] [place], [place] [address]    Special Requests   Final    BOTTLES DRAWN AEROBIC AND ANAEROBIC Blood Culture adequate volume Performed at [facility], [address] [place], [place] [address]    Culture  Setup Time   Final    GRAM NEGATIVE RODS IN BOTH AEROBIC AND ANAEROBIC BOTTLES CRITICAL RESULT CALLED TO, READ BACK BY AND VERIFIED WITH: DAVID BESANTI AT 0500 ON 05/23/18 RWW Performed  at [facility], [address], [place], [place] [address]    Culture ESCHERICHIA COLI (A)  Final   Report Status 05/25/2018 FINAL  Final   Organism ID, Bacteria ESCHERICHIA COLI  Final      Susceptibility   Escherichia coli - MIC*    AMPICILLIN >=32 RESISTANT Resistant     CEFAZOLIN <=4 SENSITIVE Sensitive     CEFEPIME <=1 SENSITIVE Sensitive     CEFTAZIDIME <=1 SENSITIVE Sensitive     CEFTRIAXONE <=1 SENSITIVE Sensitive     CIPROFLOXACIN 0.5 SENSITIVE Sensitive     GENTAMICIN <=1 SENSITIVE Sensitive     IMIPENEM <=0.25 SENSITIVE Sensitive     TRIMETH/SULFA >=320 RESISTANT Resistant     AMPICILLIN/SULBACTAM 16 INTERMEDIATE Intermediate     PIP/TAZO <=4 SENSITIVE Sensitive     Extended ESBL NEGATIVE Sensitive     * ESCHERICHIA COLI  Urine culture     Status: None   Collection Time: 05/22/18  3:28 PM  Result Value Ref Range Status   Specimen Description   Final    URINE, RANDOM Performed at [facility], [address], [place], [place] [address]    Special Requests   Final    NONE Performed at [facility], [address], [place], [place] [address]    Culture   Final    Multiple bacterial morphotypes present, none predominant. Suggest appropriate recollection if clinically indicated.   Report Status 05/24/2018 FINAL  Final  SARS Coronavirus  2 [facility] order, Performed in [facility] hospital lab)     Status: None   Collection Time: 05/22/18  3:28 PM  Result Value Ref Range Status   SARS Coronavirus 2 NEGATIVE NEGATIVE Final    Comment: (NOTE) If result is NEGATIVE SARS-CoV-2 target nucleic acids are NOT DETECTED. The SARS-CoV-2 RNA is generally detectable in upper and lower  respiratory specimens during the acute phase of infection. The lowest  concentration of SARS-CoV-2 viral copies this assay can detect is 250  copies / mL. A negative result does not preclude SARS-CoV-2 infection  and should not be used as the sole basis for treatment or other  patient management decisions.  A negative result may occur with  improper specimen collection / handling, submission of specimen other  than nasopharyngeal swab, presence of viral mutation(s) within the  areas targeted by this assay, and inadequate number of viral copies  (<250 copies / mL). A negative result must be combined with clinical  observations, patient history, and epidemiological information. If result is POSITIVE SARS-CoV-2 target nucleic acids are DETECTED. The SARS-CoV-2 RNA is generally detectable in upper and lower  respiratory specimens dur ing the acute phase of infection.  Positive  results are indicative of active infection with SARS-CoV-2.  Clinical  correlation with patient history and other diagnostic information is  necessary to determine patient infection status.  Positive results do  not rule out bacterial infection or co-infection with other viruses. If result is PRESUMPTIVE POSTIVE SARS-CoV-2 nucleic acids MAY BE PRESENT.   A presumptive positive result was obtained on the submitted specimen  and confirmed on repeat testing.  While 2019 novel coronavirus  (SARS-CoV-2) nucleic acids may be present in the submitted sample  additional confirmatory testing may be necessary for epidemiological  and / or clinical management purposes  to differentiate  between  SARS-CoV-2 and other Sarbecovirus currently known to infect humans.  If clinically indicated additional testing with an alternate test  methodology [phone]) is advised. The SARS-CoV-2  RNA is generally  detectable in upper and lower respiratory sp ecimens during the acute  phase of infection. The expected result is Negative. Fact Sheet for Patients:  BoilerBrush.com.cy Fact Sheet for Healthcare Providers: https://pope.com/ This test is not yet approved or cleared by the [place] FDA and has been authorized for detection and/or diagnosis of SARS-CoV-2 by FDA under an Emergency Use Authorization (EUA).  This EUA will remain in effect (meaning this test can be used) for the duration of the COVID-19 declaration under Section 564(b)(1) of the Act, 21 U.S.C. section 360bbb-3(b)(1), unless the authorization is terminated or revoked sooner. Performed at [facility], [address] [place], [place] [address]   Blood Culture ID Panel (Reflexed)     Status: Abnormal   Collection Time: 05/22/18  3:28 PM  Result Value Ref Range Status   Enterococcus species NOT DETECTED NOT DETECTED Final   Listeria monocytogenes NOT DETECTED NOT DETECTED Final   Staphylococcus species NOT DETECTED NOT DETECTED Final   Staphylococcus aureus (BCID) NOT DETECTED NOT DETECTED Final   Streptococcus species NOT DETECTED NOT DETECTED Final   Streptococcus agalactiae NOT DETECTED NOT DETECTED Final   Streptococcus pneumoniae NOT DETECTED NOT DETECTED Final   Streptococcus pyogenes NOT DETECTED NOT DETECTED Final   Acinetobacter baumannii NOT DETECTED NOT DETECTED Final   Enterobacteriaceae species DETECTED (A) NOT DETECTED Final    Comment: Enterobacteriaceae represent a large family of gram-negative bacteria, not a single organism. CRITICAL RESULT CALLED TO, READ BACK BY AND VERIFIED WITH: DAVAD BESANTI AT 0500 ON 05/23/18 RWW    Enterobacter  cloacae complex NOT DETECTED NOT DETECTED Final   Escherichia coli DETECTED (A) NOT DETECTED Final    Comment: CRITICAL RESULT CALLED TO, READ BACK BY AND VERIFIED WITH: DAVID BESANTI AT 0500 ON 05/23/18 RWW    Klebsiella oxytoca NOT DETECTED NOT DETECTED Final   Klebsiella pneumoniae NOT DETECTED NOT DETECTED Final   Proteus species NOT DETECTED NOT DETECTED Final   Serratia marcescens NOT DETECTED NOT DETECTED Final   Carbapenem resistance NOT DETECTED NOT DETECTED Final   Haemophilus influenzae NOT DETECTED NOT DETECTED Final   Neisseria meningitidis NOT DETECTED NOT DETECTED Final   Pseudomonas aeruginosa NOT DETECTED NOT DETECTED Final   Candida albicans NOT DETECTED NOT DETECTED Final   Candida glabrata NOT DETECTED NOT DETECTED Final   Candida krusei NOT DETECTED NOT DETECTED Final   Candida parapsilosis NOT DETECTED NOT DETECTED Final   Candida tropicalis NOT DETECTED NOT DETECTED Final    Comment: Performed at [facility], [address] [place], [place] [address]  Culture, blood (single) w Reflex to ID Panel     Status: None   Collection Time: 05/24/18  4:02 AM  Result Value Ref Range Status   Specimen Description BLOOD LEFT ANTECUBITAL  Final   Special Requests   Final    BOTTLES DRAWN AEROBIC AND ANAEROBIC Blood Culture adequate volume   Culture   Final    NO GROWTH 5 DAYS Performed at [facility], [address], [place], [place] [address]    Report Status 05/29/2018 FINAL  Final    RADIOLOGY:  No results found.  EKG:   Orders placed or performed during the hospital encounter of 05/22/18  . ED EKG 12-Lead  . ED EKG 12-Lead  . EKG 12-Lead  . EKG 12-Lead      Management plans discussed with the patient, family and they are in agreement.  CODE STATUS:  Code Status History  Date Active Date Inactive Code Status Order ID Comments User Context   05/22/2018 2004 05/25/2018 1715 Full Code [id number]  Campbell Stall, MD Inpatient    05/01/2018 0026 05/02/2018 1547 Full Code [id number]  Arnaldo Natal, MD Inpatient   05/01/2016 2036 05/04/2016 1944 Full Code [id number]  Clapacs, Jackquline Denmark, MD Inpatient      TOTAL TIME TAKING CARE OF THIS PATIENT: 35 minutes.    Altamese Dilling M.D on 06/02/2018 at 8:22 AM  Between 7am to 6pm - Pager - [phone]  After 6pm go to [email or web address] - password [email or web address]  [facility]  Office  [phone]  CC: Primary care physician; System, Pcp Not In   Note: This dictation was prepared with Dragon dictation along with smaller phrase technology. Any transcriptional errors that result from this process are unintentional.

## 2018-06-15 ENCOUNTER — Ambulatory Visit: Payer: Self-pay | Admitting: Gerontology

## 2018-06-22 ENCOUNTER — Other Ambulatory Visit: Payer: Self-pay

## 2018-06-22 ENCOUNTER — Ambulatory Visit: Payer: Medicaid Other | Admitting: Gerontology

## 2018-06-22 ENCOUNTER — Encounter: Payer: Self-pay | Admitting: Gerontology

## 2018-06-22 DIAGNOSIS — Z8619 Personal history of other infectious and parasitic diseases: Secondary | ICD-10-CM

## 2018-06-22 DIAGNOSIS — Z7689 Persons encountering health services in other specified circumstances: Secondary | ICD-10-CM

## 2018-06-22 DIAGNOSIS — G8929 Other chronic pain: Secondary | ICD-10-CM

## 2018-06-22 DIAGNOSIS — F332 Major depressive disorder, recurrent severe without psychotic features: Secondary | ICD-10-CM

## 2018-06-22 DIAGNOSIS — R9431 Abnormal electrocardiogram [ECG] [EKG]: Secondary | ICD-10-CM

## 2018-06-22 DIAGNOSIS — Z87898 Personal history of other specified conditions: Secondary | ICD-10-CM

## 2018-06-22 MED ORDER — DILTIAZEM HCL ER COATED BEADS 120 MG PO CP24: 120.0000 mg | ORAL_CAPSULE | Freq: Every day | ORAL | 0 refills | Status: DC

## 2018-06-22 MED ORDER — CYCLOBENZAPRINE HCL 5 MG PO TABS: 5.0000 mg | ORAL_TABLET | Freq: Three times a day (TID) | ORAL | 0 refills | Status: DC
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
## 2018-06-29 ENCOUNTER — Encounter: Payer: Self-pay | Admitting: *Deleted

## 2018-07-07 ENCOUNTER — Other Ambulatory Visit: Payer: Self-pay

## 2018-07-07 ENCOUNTER — Other Ambulatory Visit: Payer: Medicaid Other

## 2018-07-07 DIAGNOSIS — Z7689 Persons encountering health services in other specified circumstances: Secondary | ICD-10-CM

## 2018-07-08 ENCOUNTER — Ambulatory Visit: Payer: Medicaid Other | Admitting: Gerontology

## 2018-07-08 ENCOUNTER — Ambulatory Visit: Payer: Medicaid Other | Admitting: Gastroenterology

## 2018-07-08 LAB — CBC WITH DIFFERENTIAL/PLATELET
Basophils Absolute: 0.1 10*3/uL (ref 0.0–0.2)
Basos: 1 %
EOS (ABSOLUTE): 0.2 10*3/uL (ref 0.0–0.4)
Eos: 2 %
Hematocrit: 37.2 % (ref 34.0–46.6)
Hemoglobin: 11 g/dL — ABNORMAL LOW (ref 11.1–15.9)
Immature Grans (Abs): 0 10*3/uL (ref 0.0–0.1)
Immature Granulocytes: 0 %
Lymphocytes Absolute: 3.6 10*3/uL — ABNORMAL HIGH (ref 0.7–3.1)
Lymphs: 29 %
MCH: 20.1 pg — ABNORMAL LOW (ref 26.6–33.0)
MCHC: 29.6 g/dL — ABNORMAL LOW (ref 31.5–35.7)
MCV: 68 fL — ABNORMAL LOW (ref 79–97)
Monocytes Absolute: 0.9 10*3/uL (ref 0.1–0.9)
Monocytes: 8 %
Neutrophils Absolute: 7.5 10*3/uL — ABNORMAL HIGH (ref 1.4–7.0)
Neutrophils: 60 %
Platelets: 477 10*3/uL — ABNORMAL HIGH (ref 150–450)
RBC: 5.46 x10E6/uL — ABNORMAL HIGH (ref 3.77–5.28)
RDW: 21.8 % — ABNORMAL HIGH (ref 11.7–15.4)
WBC: 12.3 10*3/uL — ABNORMAL HIGH (ref 3.4–10.8)

## 2018-07-08 LAB — COMPREHENSIVE METABOLIC PANEL
ALT: 84 IU/L — ABNORMAL HIGH (ref 0–32)
AST: 66 IU/L — ABNORMAL HIGH (ref 0–40)
Albumin/Globulin Ratio: 1.1 — ABNORMAL LOW (ref 1.2–2.2)
Albumin: 4 g/dL (ref 3.8–4.8)
Alkaline Phosphatase: 210 IU/L — ABNORMAL HIGH (ref 39–117)
BUN/Creatinine Ratio: 13 (ref 9–23)
BUN: 12 mg/dL (ref 6–24)
Bilirubin Total: 0.2 mg/dL (ref 0.0–1.2)
CO2: 24 mmol/L (ref 20–29)
Calcium: 9.4 mg/dL (ref 8.7–10.2)
Chloride: 99 mmol/L (ref 96–106)
Creatinine, Ser: 0.89 mg/dL (ref 0.57–1.00)
GFR calc Af Amer: 90 mL/min/{1.73_m2} (ref >59)
GFR calc non Af Amer: 78 mL/min/{1.73_m2} (ref >59)
Globulin, Total: 3.8 g/dL (ref 1.5–4.5)
Glucose: 130 mg/dL — ABNORMAL HIGH (ref 65–99)
Potassium: 4.5 mmol/L (ref 3.5–5.2)
Sodium: 139 mmol/L (ref 134–144)
Total Protein: 7.8 g/dL (ref 6.0–8.5)

## 2018-07-08 LAB — URINALYSIS
Bilirubin, UA: NEGATIVE
Glucose, UA: NEGATIVE
Ketones, UA: NEGATIVE
Nitrite, UA: NEGATIVE
RBC, UA: NEGATIVE
Specific Gravity, UA: 1.016 (ref 1.005–1.030)
Urobilinogen, Ur: 0.2 mg/dL (ref 0.2–1.0)
pH, UA: 6 (ref 5.0–7.5)

## 2018-07-08 LAB — LIPID PANEL
Chol/HDL Ratio: 3.4 ratio (ref 0.0–4.4)
Cholesterol, Total: 163 mg/dL (ref 100–199)
HDL: 48 mg/dL (ref >39)
LDL Calculated: 83 mg/dL (ref 0–99)
Triglycerides: 159 mg/dL — ABNORMAL HIGH (ref 0–149)
VLDL Cholesterol Cal: 32 mg/dL (ref 5–40)

## 2018-07-08 NOTE — Progress Notes (Deleted)
Gastroenterology Consultation  Referring Provider:     Rolm GalaIloabachie, Chioma E, NP Primary Care Physician:  System, Pcp Not In Primary Gastroenterologist:  Dr. Servando SnareWohl     Reason for Consultation:     Hepatitis C        HPI:   Brenda [name] is a 48 y.o. y/o female referred for consultation & management of hepatitis C by Dr. Cynda FamiliaSystem, Pcp Not In.  This patient comes in today after being seen in the hospital by infectious disease and noted to have hepatitis C antibody positive.  The patient was then recommended to have follow-up with GI or ID for treatment of her hepatitis C.  The patient has a history of depression and polysubstance abuse.  The patient was in the hospital for acute kidney injury due to sepsis.  She was seen for follow-up in May with a telephone visit the patient had reported that she had a history of heroin use with her last use 3 weeks prior to that.  The patient denies any history of treatment for her hepatitis C.  The patient's labs have shown:  Component     Latest Ref Rng & Units 05/22/2018 05/22/2018 05/22/2018 05/23/2018         3:27 PM  6:35 PM  8:42 PM   Albumin     3.8 - 4.8 g/dL 3.0 (L)     Globulin, Total     1.5 - 4.5 g/dL      Albumin/Globulin Ratio     1.2 - 2.2      Total Bilirubin     0.0 - 1.2 mg/dL 0.7     Alkaline Phosphatase     39 - 117 IU/L 102     AST     0 - 40 IU/L 53 (H)     ALT     0 - 32 IU/L 58 (H)      Component     Latest Ref Rng & Units 05/24/2018 05/25/2018 07/07/2018            Albumin     3.8 - 4.8 g/dL 2.2 (L)  4.0  Globulin, Total     1.5 - 4.5 g/dL   3.8  Albumin/Globulin Ratio     1.2 - 2.2   1.1 (L)  Total Bilirubin     0.0 - 1.2 mg/dL 0.9  0.2  Alkaline Phosphatase     39 - 117 IU/L 78  210 (H)  AST     0 - 40 IU/L 31  66 (H)  ALT     0 - 32 IU/L 34  84 (H)    Although the patient had an antibody checked she does not have any viral load.  The patient's liver enzymes were in fact normal on 4/27 of 2020 but high 2  days prior to that.  Past Medical History:  Diagnosis Date  . Depression   . Polysubstance abuse [facility](HCC)     Past Surgical History:  Procedure Laterality Date  . TUBAL LIGATION      Prior to Admission medications   Medication Sig Start Date End Date Taking? Authorizing Provider  acetaminophen (TYLENOL) 325 MG tablet Take 2 tablets (650 mg total) by mouth every 6 (six) hours as needed for mild pain (or Fever >/= 101). 05/25/18   Altamese DillingVachhani, Vaibhavkumar, MD  albuterol (PROVENTIL HFA;VENTOLIN HFA) 108 (90 Base) MCG/ACT inhaler Inhale 2 puffs into the lungs every 4 (four) hours as needed for wheezing or shortness  of breath. 04/30/18   Arta Silence, MD  cyclobenzaprine (FLEXERIL) 5 MG tablet Take 1 tablet (5 mg total) by mouth 3 (three) times daily. 06/22/18   Iloabachie, Chioma E, NP  diltiazem (CARDIZEM CD) 120 MG 24 hr capsule Take 1 capsule (120 mg total) by mouth daily. 06/22/18   Iloabachie, Chioma E, NP  guaiFENesin-dextromethorphan (ROBITUSSIN DM) 100-10 MG/5ML syrup Take 5 mLs by mouth every 4 (four) hours as needed for cough. Patient not taking: Reported on 05/23/2018 05/02/18   Fritzi Mandes, MD  oxyCODONE-acetaminophen (PERCOCET/ROXICET) 5-325 MG tablet Take 1 tablet by mouth every 6 (six) hours as needed for moderate pain. 05/25/18   Vaughan Basta, MD    No family history on file.   Social History   Tobacco Use  . Smoking status: Current Every Day Smoker    Packs/day: 1.00    Years: 30.00    Pack years: 30.00    Types: Cigarettes  . Smokeless tobacco: Never Used  Substance Use Topics  . Alcohol use: No  . Drug use: Yes    Frequency: 7.0 times per week    Types: Cocaine    Comment: uses heroin for pain    Allergies as of 07/08/2018 - Review Complete 06/22/2018  Allergen Reaction Noted  . Flexeril [cyclobenzaprine] Other (See Comments) 02/02/2016  . Seroquel [quetiapine fumarate] Other (See Comments) 05/01/2018    Review of Systems:    All systems reviewed  and negative except where noted in HPI.   Physical Exam:  There were no vitals taken for this visit. No LMP recorded. (Menstrual status: Perimenopausal). General:   Alert,  Well-developed, ***well-nourished, pleasant and cooperative in NAD Head:  Normocephalic and atraumatic. Eyes:  Sclera clear, no icterus.   Conjunctiva pink. Ears:  Normal auditory acuity. Nose:  No deformity, discharge, or lesions. Mouth:  No deformity or lesions,oropharynx pink & moist. Neck:  Supple; no masses or thyromegaly. Lungs:  Respirations even and unlabored.  Clear throughout to auscultation.   No wheezes, crackles, or rhonchi. No acute distress. Heart:  Regular rate and rhythm; no murmurs, clicks, rubs, or gallops. Abdomen:  Normal bowel sounds.  No bruits.  Soft, non-tender and non-distended without masses, hepatosplenomegaly or hernias noted.  No guarding or rebound tenderness.  ***Negative Carnett sign.   Rectal:  Deferred.***  Msk:  Symmetrical without gross deformities.  ***Good, equal movement & strength bilaterally. Pulses:  Normal pulses noted. Extremities:  No clubbing or edema.  No cyanosis. Neurologic:  Alert and oriented x3;  grossly normal neurologically. Skin:  Intact without significant lesions or rashes.  ***No jaundice. Lymph Nodes:  No significant cervical adenopathy. Psych:  Alert and cooperative. Normal mood and affect.  Imaging Studies: No results found.  Assessment and Plan:   Brenda [name] is a 48 y.o. y/o female ***  Lucilla Lame, MD. Marval Regal    Note: This dictation was prepared with Dragon dictation along with smaller phrase technology. Any transcriptional errors that result from this process are unintentional.

## 2018-07-13 ENCOUNTER — Other Ambulatory Visit: Payer: Self-pay

## 2018-07-13 ENCOUNTER — Ambulatory Visit: Payer: Medicaid Other | Admitting: Gerontology

## 2018-07-13 ENCOUNTER — Ambulatory Visit: Payer: Medicaid Other | Admitting: Licensed Clinical Social Worker

## 2018-07-13 DIAGNOSIS — F332 Major depressive disorder, recurrent severe without psychotic features: Secondary | ICD-10-CM

## 2018-07-13 DIAGNOSIS — F1590 Other stimulant use, unspecified, uncomplicated: Secondary | ICD-10-CM

## 2018-07-13 DIAGNOSIS — F1120 Opioid dependence, uncomplicated: Secondary | ICD-10-CM

## 2018-07-13 DIAGNOSIS — F411 Generalized anxiety disorder: Secondary | ICD-10-CM
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
## 2018-07-14 ENCOUNTER — Ambulatory Visit: Payer: Medicaid Other | Admitting: Gerontology

## 2018-07-14 DIAGNOSIS — M25551 Pain in right hip: Secondary | ICD-10-CM

## 2018-07-14 DIAGNOSIS — Z8619 Personal history of other infectious and parasitic diseases: Secondary | ICD-10-CM

## 2018-07-14 DIAGNOSIS — R899 Unspecified abnormal finding in specimens from other organs, systems and tissues: Secondary | ICD-10-CM

## 2018-07-14 DIAGNOSIS — G8929 Other chronic pain: Secondary | ICD-10-CM

## 2018-07-14 DIAGNOSIS — Z87898 Personal history of other specified conditions: Secondary | ICD-10-CM

## 2018-07-14 DIAGNOSIS — R9431 Abnormal electrocardiogram [ECG] [EKG]: Secondary | ICD-10-CM

## 2018-07-14 DIAGNOSIS — N390 Urinary tract infection, site not specified: Secondary | ICD-10-CM

## 2018-07-14 DIAGNOSIS — N898 Other specified noninflammatory disorders of vagina: Secondary | ICD-10-CM

## 2018-07-14 MED ORDER — IBUPROFEN 800 MG PO TABS: 800.0000 mg | ORAL_TABLET | Freq: Three times a day (TID) | ORAL | 0 refills | Status: DC | PRN

## 2018-07-14 MED ORDER — NITROFURANTOIN MONOHYD MACRO 100 MG PO CAPS: 100.0000 mg | ORAL_CAPSULE | Freq: Two times a day (BID) | ORAL | 0 refills | Status: DC

## 2018-07-14 MED ORDER — FERROUS SULFATE 325 (65 FE) MG PO TABS: 325.0000 mg | ORAL_TABLET | Freq: Every day | ORAL | 3 refills | Status: DC

## 2018-07-14 MED ORDER — DILTIAZEM HCL ER COATED BEADS 120 MG PO CP24: 120.0000 mg | ORAL_CAPSULE | Freq: Every day | ORAL | 0 refills | Status: DC

## 2018-07-14 NOTE — Progress Notes (Signed)
Established Patient Office Visit  Subjective:  Patient ID: Brenda [name], female    DOB: [birth year]  Age: 48 y.o. MRN: [id number]  CC:  Chief Complaint  Patient presents with  . Follow-up  Patient consents to telephone visit and 2 patient identifiers was used to identify patient.  HPI ZNYA ALBINO presents for follow right hip pain, peripheral edema and lab review. She continues to experience 10/10 constant non radiating sharp pain to right hip and she will call to schedule Orthopedic appointment. She also reports having an appointment with Gastroenterology on 07/15/18 for positive Hep C, elevated liver enzymes,and she denies right upper quadrant abdominal pain and jaundice. She states that she continues to have bilateral lower extremity edema since being discharged from the hospital on 05/25/18, was started on 120 mg Diltiazem daily, has and abnormal EKG and was yet to follow up with Cardiologist. She reports mild relief to swelling with elevation. She is also yet to follow up with Ob/gyn for mass to right breast, she denies pain and nipple discharge. She continues to experience mild intermittent shortness of breath " every now and then" and uses albuterol inhaler for relief, she continues to smoke 1 pack of cigarette and states that she's not ready to quit.  Currently, she c/o minimal amount of whitish, odorless vaginal discharge and mild itching. Her urine appearance was cloudy with 3+ leukocytes done on 07/07/18. She denies dysuria, pelvic, abdominal pain,  and flank pain. Complete blood count done on 07/07/18 showed leukocytosis,  thrombocytosis and anemia, she denies fever and chills. Renal functions are wnl compared to 1 month ago. Liver enzymes were normal 1 month ago, but AST 66,ALT 84 and Alkaline Phosphatase was 210 on 07/07/18. She reports that she worries a lot, denies suicidal or homicidal ideation, and follow up with [facility] health provider. She denies chest pain, palpitation  and no further concern.   Past Medical History:  Diagnosis Date  . Depression   . Polysubstance abuse [facility])     Past Surgical History:  Procedure Laterality Date  . TUBAL LIGATION      No family history on file.  Social History   Socioeconomic History  . Marital status: Single    Spouse name: Not on file  . Number of children: Not on file  . Years of education: Not on file  . Highest education level: Not on file  Occupational History  . Not on file  Social Needs  . Financial resource strain: Not on file  . Food insecurity    Worry: Not on file    Inability: Not on file  . Transportation needs    Medical: Not on file    Non-medical: Not on file  Tobacco Use  . Smoking status: Current Every Day Smoker    Packs/day: 1.00    Years: 30.00    Pack years: 30.00    Types: Cigarettes  . Smokeless tobacco: Never Used  Substance and Sexual Activity  . Alcohol use: No  . Drug use: Yes    Frequency: 7.0 times per week    Types: Cocaine    Comment: uses heroin for pain  . Sexual activity: Yes    Birth control/protection: None  Lifestyle  . Physical activity    Days per week: Not on file    Minutes per session: Not on file  . Stress: Not on file  Relationships  . Social connections    Talks on phone: Not on file  Gets together: Not on file    Attends religious service: Not on file    Active member of club or organization: Not on file    Attends meetings of clubs or organizations: Not on file    Relationship status: Not on file  . Intimate partner violence    Fear of current or ex partner: Not on file    Emotionally abused: Not on file    Physically abused: Not on file    Forced sexual activity: Not on file  Other Topics Concern  . Not on file  Social History Narrative  . Not on file    Outpatient Medications Prior to Visit  Medication Sig Dispense Refill  . albuterol (PROVENTIL HFA;VENTOLIN HFA) 108 (90 Base) MCG/ACT inhaler Inhale 2 puffs into the lungs  every 4 (four) hours as needed for wheezing or shortness of breath. 1 Inhaler 1  . cyclobenzaprine (FLEXERIL) 5 MG tablet Take 1 tablet (5 mg total) by mouth 3 (three) times daily. 90 tablet 0  . acetaminophen (TYLENOL) 325 MG tablet Take 2 tablets (650 mg total) by mouth every 6 (six) hours as needed for mild pain (or Fever >/= 101). 20 tablet 0  . diltiazem (CARDIZEM CD) 120 MG 24 hr capsule Take 1 capsule (120 mg total) by mouth daily. 30 capsule 0  . guaiFENesin-dextromethorphan (ROBITUSSIN DM) 100-10 MG/5ML syrup Take 5 mLs by mouth every 4 (four) hours as needed for cough. (Patient not taking: Reported on 05/23/2018) 118 mL 0  . oxyCODONE-acetaminophen (PERCOCET/ROXICET) 5-325 MG tablet Take 1 tablet by mouth every 6 (six) hours as needed for moderate pain. 20 tablet 0   No facility-administered medications prior to visit.     Allergies  Allergen Reactions  . Flexeril [Cyclobenzaprine] Other (See Comments)    RLS-like symptoms   . Seroquel [Quetiapine Fumarate] Other (See Comments)    RLS-like symptoms    ROS Review of Systems  Constitutional: Negative.   Respiratory: Positive for shortness of breath (mild intermittent when walking).   Cardiovascular: Positive for leg swelling. Negative for chest pain.  Gastrointestinal: Negative.   Genitourinary: Positive for vaginal discharge.  Musculoskeletal: Positive for arthralgias (chronic right hip pain).  Skin: Negative.   Neurological: Negative.   Psychiatric/Behavioral: The patient is nervous/anxious (worries a lot).       Objective:    Physical Exam No vital sign or PE was done There were no vitals taken for this visit. Wt Readings from Last 3 Encounters:  05/22/18 201 lb 1 oz (91.2 kg)  05/02/18 195 lb 12.3 oz (88.8 kg)  06/28/16 170 lb (77.1 kg)     Health Maintenance Due  Topic Date Due  . PAP SMEAR-Modifier  02/08/1993    There are no preventive care reminders to display for this patient.  Lab Results   Component Value Date   TSH 1.192 04/30/2018   Lab Results  Component Value Date   WBC 12.3 (H) 07/07/2018   HGB 11.0 (L) 07/07/2018   HCT 37.2 07/07/2018   MCV 68 (L) 07/07/2018   PLT 477 (H) 07/07/2018   Lab Results  Component Value Date   NA 139 07/07/2018   K 4.5 07/07/2018   CO2 24 07/07/2018   GLUCOSE 130 (H) 07/07/2018   BUN 12 07/07/2018   CREATININE 0.89 07/07/2018   BILITOT 0.2 07/07/2018   ALKPHOS 210 (H) 07/07/2018   AST 66 (H) 07/07/2018   ALT 84 (H) 07/07/2018   PROT 7.8 07/07/2018   ALBUMIN 4.0 07/07/2018  CALCIUM 9.4 07/07/2018   ANIONGAP 9 05/25/2018   Lab Results  Component Value Date   CHOL 163 07/07/2018   Lab Results  Component Value Date   HDL 48 07/07/2018   Lab Results  Component Value Date   LDLCALC 83 07/07/2018   Lab Results  Component Value Date   TRIG 159 (H) 07/07/2018   Lab Results  Component Value Date   CHOLHDL 3.4 07/07/2018   Lab Results  Component Value Date   HGBA1C 5.1 05/02/2016      Assessment & Plan:    1. History of lump of right breast - She was advised to call and schedule Mammogram and waiting for  - Ambulatory referral to Obstetrics / Gynecology  2. History of peripheral edema - Ddx probable side effect of Diltiazem, needs Cardiology follow up. --Elevate your legs up above heart level while resting - Wear compression stockings if standing or walking for a while -Reduce sodium intake and limit fluid intake - diltiazem (CARDIZEM CD) 120 MG 24 hr capsule; Take 1 capsule (120 mg total) by mouth daily.  Dispense: 30 capsule; Refill: 0 - She is waiting for Cardiology appointment - She was advised to go to the ED for worsening edema. 3. Abnormal EKG - She's waiting for Cardiology appointment - diltiazem (CARDIZEM CD) 120 MG 24 hr capsule; Take 1 capsule (120 mg total) by mouth daily.  Dispense: 30 capsule; Refill: 0  4. Chronic right hip pain - Acetaminophen was discontinued due to elevated Liver  enzymes, will start Ibuprofen, and she was educated on medication side effects. - ibuprofen (ADVIL) 800 MG tablet; Take 1 tablet (800 mg total) by mouth every 8 (eight) hours as needed for moderate pain.  Dispense: 30 tablet; Refill: 0 - Orthopedic referral was ordered.  5. History of hepatitis - She will follow up with Dr Servando SnareWohl on 07/15/18  6. Abnormal laboratory test Leukocytosis, Thrombocytosis and  Microcytic Hypochromic anemia - ferrous sulfate 325 (65 FE) MG tablet; Take 1 tablet (325 mg total) by mouth daily with breakfast.  Dispense: 30 tablet; Refill: 3. Patient educated on medication side effects. - Recheck CBC/diff in 6 weeks.  7. Urinary tract infection without hematuria, site unspecified -Ddx UTI ?, will pick up antibiotics after urine sample for culture. - UA/M w/rflx Culture, Routine; Future - nitrofurantoin, macrocrystal-monohydrate, (MACROBID) 100 MG capsule; Take 1 capsule (100 mg total) by mouth 2 (two) times daily.  Dispense: 14 capsule; Refill: 0   8. Vaginal discharge Ddx   Candidiasis ?  - She was advised to call the clinic with increase vaginal discharge, and itching. - Perform proper perineal care.     Follow-up: Return in about 1 month (around 08/13/2018), or if symptoms worsen or fail to improve.    Calianne Larue Trellis PaganiniE Jazari Ober, NP

## 2018-07-15 ENCOUNTER — Ambulatory Visit (INDEPENDENT_AMBULATORY_CARE_PROVIDER_SITE_OTHER): Payer: Self-pay | Admitting: Gastroenterology

## 2018-07-15 ENCOUNTER — Other Ambulatory Visit: Payer: Self-pay

## 2018-07-15 ENCOUNTER — Other Ambulatory Visit: Admission: RE | Admit: 2018-07-15 | Payer: Medicaid Other | Source: Home / Self Care | Admitting: Gastroenterology

## 2018-07-15 VITALS — BP 116/87 | HR 103 | Temp 98.1°F | Ht 62.0 in | Wt 196.8 lb

## 2018-07-15 DIAGNOSIS — B182 Chronic viral hepatitis C: Secondary | ICD-10-CM

## 2018-07-15 DIAGNOSIS — D500 Iron deficiency anemia secondary to blood loss (chronic): Secondary | ICD-10-CM

## 2018-07-15 MED ORDER — SUPREP BOWEL PREP KIT 17.5-3.13-1.6 GM/177ML PO SOLN: 1.0000 | ORAL | 0 refills | Status: DC
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
## 2018-07-20 ENCOUNTER — Ambulatory Visit: Payer: Medicaid Other | Admitting: Licensed Clinical Social Worker

## 2018-07-20 ENCOUNTER — Other Ambulatory Visit: Payer: Self-pay

## 2018-07-20 DIAGNOSIS — F332 Major depressive disorder, recurrent severe without psychotic features: Secondary | ICD-10-CM

## 2018-07-20 DIAGNOSIS — F411 Generalized anxiety disorder: Secondary | ICD-10-CM

## 2018-07-20 DIAGNOSIS — F1120 Opioid dependence, uncomplicated: Secondary | ICD-10-CM

## 2018-07-20 DIAGNOSIS — F1420 Cocaine dependence, uncomplicated: Secondary | ICD-10-CM

## 2018-07-20 NOTE — BH Specialist Note (Signed)
Clinician contacted the patient for a follow up appointment. She explained that after a case consultation with Dr. Octavia Heir, MD, Psychiatric Consultant that due to her active addiction to heroin that its recommended that she enroll in a Substance Abuse Intensive Outpatient program at either [facility], [facility] or [facility] She explained to the patient that its important that she get on Suboxene to curb her cravings for Heroin. She explained that neither she nor Dr. Octavia Heir, MD, can treat her mental health symptoms until she has some clean time under her belt.  Patient was agreeable to the plans.

## 2018-07-21 ENCOUNTER — Other Ambulatory Visit
Admission: RE | Admit: 2018-07-21 | Discharge: 2018-07-21 | Disposition: A | Discharge status: Home / Self Care | Payer: Self-pay | Attending: Gastroenterology | Admitting: Gastroenterology

## 2018-07-21 ENCOUNTER — Other Ambulatory Visit: Payer: Self-pay

## 2018-07-21 DIAGNOSIS — B182 Chronic viral hepatitis C: Secondary | ICD-10-CM | POA: Insufficient documentation

## 2018-07-21 LAB — IRON AND TIBC
Iron: 264 ug/dL — ABNORMAL HIGH (ref 28–170)
Saturation Ratios: 53 % — ABNORMAL HIGH (ref 10.4–31.8)
TIBC: 501 ug/dL — ABNORMAL HIGH (ref 250–450)
UIBC: 238 ug/dL

## 2018-07-21 LAB — HEPATIC FUNCTION PANEL
ALT: 70 U/L — ABNORMAL HIGH (ref 0–44)
AST: 50 U/L — ABNORMAL HIGH (ref 15–41)
Albumin: 3.8 g/dL (ref 3.5–5.0)
Alkaline Phosphatase: 130 U/L — ABNORMAL HIGH (ref 38–126)
Bilirubin, Direct: 0.1 mg/dL (ref 0.0–0.2)
Indirect Bilirubin: 0 mg/dL — ABNORMAL LOW (ref 0.3–0.9)
Total Bilirubin: 0.1 mg/dL — ABNORMAL LOW (ref 0.3–1.2)
Total Protein: 8.3 g/dL — ABNORMAL HIGH (ref 6.5–8.1)

## 2018-07-21 LAB — FERRITIN: Ferritin: 31 ng/mL (ref 11–307)

## 2018-07-22 LAB — HCV RNA QUANT
HCV Quantitative Log: 6.188 log10 IU/mL (ref >1.70)
HCV Quantitative: 1540000 IU/mL (ref >50)

## 2018-07-22 LAB — HEPATITIS B SURFACE ANTIGEN: Hepatitis B Surface Ag: NEGATIVE

## 2018-07-24 LAB — ANA: Anti Nuclear Antibody (ANA): NEGATIVE

## 2018-07-24 LAB — HEPATITIS C GENOTYPE: HCV Genotype: 3

## 2018-07-24 LAB — HEPATITIS B SURFACE ANTIBODY, QUANTITATIVE: Hep B S AB Quant (Post): <3.1 m[IU]/mL — ABNORMAL LOW (ref >9.9)

## 2018-07-24 LAB — HEPATITIS A ANTIBODY, TOTAL: hep A Total Ab: NEGATIVE

## 2018-07-26 LAB — ALPHA-1 ANTITRYPSIN PHENOTYPE: A-1 Antitrypsin, Ser: 190 mg/dL — ABNORMAL HIGH (ref 101–187)

## 2018-07-26 LAB — MITOCHONDRIAL ANTIBODIES: Mitochondrial M2 Ab, IgG: <20 Units (ref 0.0–20.0)

## 2018-07-26 LAB — ANTI-SMOOTH MUSCLE ANTIBODY, IGG: F-Actin IgG: 15 Units (ref 0–19)

## 2018-07-27 ENCOUNTER — Ambulatory Visit: Payer: Medicaid Other

## 2018-07-27 ENCOUNTER — Other Ambulatory Visit: Payer: Self-pay

## 2018-07-27 ENCOUNTER — Ambulatory Visit (INDEPENDENT_AMBULATORY_CARE_PROVIDER_SITE_OTHER): Payer: Self-pay

## 2018-07-27 ENCOUNTER — Ambulatory Visit (INDEPENDENT_AMBULATORY_CARE_PROVIDER_SITE_OTHER): Payer: Medicaid Other | Admitting: Orthopaedic Surgery

## 2018-07-27 ENCOUNTER — Telehealth: Payer: Self-pay

## 2018-07-27 DIAGNOSIS — M1611 Unilateral primary osteoarthritis, right hip: Secondary | ICD-10-CM

## 2018-07-27 LAB — HCV FIBROSURE
ALPHA 2-MACROGLOBULINS, QN: 197 mg/dL (ref 110–276)
ALT (SGPT) P5P: 79 IU/L — ABNORMAL HIGH (ref 0–40)
Apolipoprotein A-1: 130 mg/dL (ref 116–209)
Bilirubin, Total: 0.2 mg/dL (ref 0.0–1.2)
Fibrosis Score: 0.09 (ref 0.00–0.21)
GGT: 53 IU/L (ref 0–60)
Haptoglobin: 241 mg/dL (ref 42–296)
Necroinflammat Activity Score: 0.39 — ABNORMAL HIGH (ref 0.00–0.17)
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
## 2018-07-27 NOTE — Telephone Encounter (Signed)
Coronavirus (COVID-19) Are you at risk?  Are you at risk for the Coronavirus (COVID-19)?  To be considered HIGH RISK for Coronavirus (COVID-19), you have to meet the following criteria:  . Traveled to [place], [place], [place], [place] or [place]; or in the [place] to [place], [place], [place], or [place]; and have fever, cough, and shortness of breath within the last 2 weeks of travel OR . Been in close contact with a person diagnosed with COVID-19 within the last 2 weeks and have fever, cough, and shortness of breath . IF YOU DO NOT MEET THESE CRITERIA, YOU ARE CONSIDERED LOW RISK FOR COVID-19.  What to do if you are HIGH RISK for COVID-19?  . If you are having a medical emergency, call 911. . Seek medical care right away. Before you go to a doctor's office, urgent care or emergency department, call ahead and tell them about your recent travel, contact with someone diagnosed with COVID-19, and your symptoms. You should receive instructions from your physician's office regarding next steps of care.  . When you arrive at healthcare provider, tell the healthcare staff immediately you have returned from visiting [place], [place], [place], [place] or [place]; or traveled in the [place] to [place], [place], [place], or [place]; in the last two weeks or you have been in close contact with a person diagnosed with COVID-19 in the last 2 weeks.   . Tell the health care staff about your symptoms: fever, cough and shortness of breath. . After you have been seen by a medical provider, you will be either: o Tested for (COVID-19) and discharged home on quarantine except to seek medical care if symptoms worsen, and asked to  - Stay home and avoid contact with others until you get your results (4-5 days)  - Avoid travel on public transportation if possible (such as bus, train, or airplane) or o Sent to the Emergency Department by EMS for evaluation, COVID-19 testing, and possible  admission depending on your condition and test results.  What to do if you are LOW RISK for COVID-19?  Reduce your risk of any infection by using the same precautions used for avoiding the common cold or flu:  . Wash your hands often with soap and warm water for at least 20 seconds.  If soap and water are not readily available, use an alcohol-based hand sanitizer with at least 60% alcohol.  . If coughing or sneezing, cover your mouth and nose by coughing or sneezing into the elbow areas of your shirt or coat, into a tissue or into your sleeve (not your hands). . Avoid shaking hands with others and consider head nods or verbal greetings only. . Avoid touching your eyes, nose, or mouth with unwashed hands.  . Avoid close contact with people who are Brenda [name]. . Avoid places or events with large numbers of people in one location, like concerts or sporting events. . Carefully consider travel plans you have or are making. . If you are planning any travel outside or inside the [place], visit the CDC's Travelers' Health webpage for the latest health notices. . If you have some symptoms but not all symptoms, continue to monitor at home and seek medical attention if your symptoms worsen. . If you are having a medical emergency, call 911.  07/27/18 SCREENING NEG SLS ADDITIONAL HEALTHCARE OPTIONS FOR PATIENTS  [facility] / e-Visit: [email or web address]         [facility]: [phone]    [facility]: [phone]                   [facility]: [phone]  

## 2018-07-28 ENCOUNTER — Encounter: Payer: Medicaid Other | Admitting: Obstetrics and Gynecology

## 2018-07-29 ENCOUNTER — Encounter: Payer: Self-pay | Admitting: Anesthesiology

## 2018-07-29 ENCOUNTER — Telehealth: Payer: Self-pay

## 2018-07-29 ENCOUNTER — Encounter: Payer: Self-pay | Admitting: *Deleted

## 2018-07-29 ENCOUNTER — Other Ambulatory Visit: Payer: Self-pay

## 2018-07-29 NOTE — Telephone Encounter (Signed)
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 She also needs Hep A and B vaccinations.

## 2018-07-29 NOTE — Telephone Encounter (Signed)
Pt notified of lab results. Paperwork completed and faxed to [facility] for her Hep C medication.

## 2018-08-02 ENCOUNTER — Other Ambulatory Visit
Admission: RE | Admit: 2018-08-02 | Discharge: 2018-08-02 | Disposition: A | Discharge status: Home / Self Care | Payer: HRSA Program | Source: Ambulatory Visit | Attending: Gastroenterology | Admitting: Gastroenterology

## 2018-08-02 ENCOUNTER — Other Ambulatory Visit: Payer: Self-pay

## 2018-08-02 DIAGNOSIS — Z1159 Encounter for screening for other viral diseases: Secondary | ICD-10-CM | POA: Diagnosis not present

## 2018-08-02 DIAGNOSIS — Z01812 Encounter for preprocedural laboratory examination: Secondary | ICD-10-CM | POA: Insufficient documentation

## 2018-08-03 LAB — SARS CORONAVIRUS 2 (TAT 6-24 HRS): SARS Coronavirus 2: NEGATIVE

## 2018-08-04 ENCOUNTER — Other Ambulatory Visit: Payer: Self-pay | Admitting: Gastroenterology

## 2018-08-04 MED ORDER — NA SULFATE-K SULFATE-MG SULF 17.5-3.13-1.6 GM/177ML PO SOLN: 1.0000 | Freq: Once | ORAL | 0 refills | Status: AC

## 2018-08-04 NOTE — Discharge Instructions (Signed)

## 2018-08-05 ENCOUNTER — Other Ambulatory Visit (HOSPITAL_COMMUNITY)
Admission: RE | Admit: 2018-08-05 | Discharge: 2018-08-05 | Disposition: A | Discharge status: Home / Self Care | Payer: HRSA Program | Source: Ambulatory Visit | Attending: Orthopaedic Surgery | Admitting: Orthopaedic Surgery

## 2018-08-05 ENCOUNTER — Ambulatory Visit: Admission: RE | Admit: 2018-08-05 | Payer: Self-pay | Source: Home / Self Care | Admitting: Gastroenterology

## 2018-08-05 DIAGNOSIS — Z01812 Encounter for preprocedural laboratory examination: Secondary | ICD-10-CM | POA: Insufficient documentation

## 2018-08-05 DIAGNOSIS — Z1159 Encounter for screening for other viral diseases: Secondary | ICD-10-CM | POA: Insufficient documentation

## 2018-08-05 HISTORY — DX: Unspecified asthma, uncomplicated: J45.909

## 2018-08-05 HISTORY — DX: Other fracture of left lower leg, initial encounter for closed fracture: S82.892A

## 2018-08-05 HISTORY — DX: Unspecified osteoarthritis, unspecified site: M19.90

## 2018-08-05 HISTORY — DX: Chronic viral hepatitis C: B18.2

## 2018-08-05 HISTORY — DX: Presence of dental prosthetic device (complete) (partial): Z97.2

## 2018-08-05 SURGERY — COLONOSCOPY WITH PROPOFOL
Anesthesia: Choice

## 2018-08-06 ENCOUNTER — Other Ambulatory Visit: Payer: Self-pay

## 2018-08-06 ENCOUNTER — Encounter (HOSPITAL_COMMUNITY): Payer: Self-pay | Admitting: *Deleted

## 2018-08-06 ENCOUNTER — Inpatient Hospital Stay (HOSPITAL_COMMUNITY): Payer: Self-pay | Admitting: Physician Assistant

## 2018-08-06 LAB — SARS CORONAVIRUS 2 (TAT 6-24 HRS): SARS Coronavirus 2: NEGATIVE

## 2018-08-06 MED ORDER — TRANEXAMIC ACID 1000 MG/10ML IV SOLN
2000.0000 mg | INTRAVENOUS | Status: DC
  Filled 2018-08-06: qty 20

## 2018-08-06 NOTE — Anesthesia Preprocedure Evaluation (Addendum)
Anesthesia Evaluation    Reviewed: Allergy & Precautions, Patient's Chart, lab work & pertinent test results  Airway        Dental   Pulmonary asthma , Current Smoker,           Cardiovascular negative cardio ROS       Neuro/Psych PSYCHIATRIC DISORDERS Anxiety Depression Bipolar Disorder negative neurological ROS     GI/Hepatic GERD  ,(+)     substance abuse  alcohol use and cocaine use, Hepatitis -, C  Endo/Other  negative endocrine ROS  Renal/GU negative Renal ROS  negative genitourinary   Musculoskeletal  (+) Arthritis , narcotic dependent  Abdominal   Peds  Hematology negative hematology ROS (+)   Anesthesia Other Findings   Reproductive/Obstetrics                            Anesthesia Physical Anesthesia Plan  ASA: III  Anesthesia Plan: Spinal   Post-op Pain Management:    Induction:   PONV Risk Score and Plan: 2 and Treatment may vary due to age or medical condition  Airway Management Planned: Natural Airway and Simple Face Mask  Additional Equipment:   Intra-op Plan:   Post-operative Plan:   Informed Consent: I have reviewed the patients History and Physical, chart, labs and discussed the procedure including the risks, benefits and alternatives for the proposed anesthesia with the patient or authorized representative who has indicated his/her understanding and acceptance.     Dental advisory given  Plan Discussed with: CRNA  Anesthesia Plan Comments: (Recently diagnosed chronic Hep C with mildly elevated transaminases (AST/ALT 50/70 on 07/21/18), being followed by GI, Dr. Allen Norris.   Hx of polysubstance abuse including cocaine and heroin. Dr. Erlinda Hong aware, has ordered DOS UDS.  Abnormal EKG 05/22/18 was in setting of admission for sepsis 2/2 UTI. Order by orthopedics for EKG on DOS.)       Anesthesia Quick Evaluation

## 2018-08-06 NOTE — Progress Notes (Signed)
Pt denies SOB, chest pain, and being under the care of a cardiologist. Pt denies having an echo and cardiac cath. Pt stated that a stress test was performed " 7 years ago." Pt denies recent labs. Pt made aware to stop taking Aspirin (unless otherwise advised by surgeon), vitamins, fish oil and herbal medications. Do not take any NSAIDs ie: Ibuprofen, Advil, Naproxen (Aleve), Motrin, BC and Goody Powder. Pt denies that she and family members tested positive for COVID-19 ( pt tested on 08/05/18 and reminded to quarantine ).  Coronavirus Screening  Pt denies that she and family members experienced the following symptoms:  Cough yes/no: No Fever (>100.79F)  yes/no: No Runny nose yes/no: No Sore throat yes/no: No Difficulty breathing/shortness of breath  yes/no: No  Have you or a family member traveled in the last 14 days and where? yes/no: No  Pt reminded that hospital visitation restrictions are in effect and the importance of the restrictions.   Pt verbalized understanding of all pre-op instructions.  PA, Anesthesiology, asked to review pt history; see note.

## 2018-08-07 ENCOUNTER — Encounter (HOSPITAL_COMMUNITY): Payer: Self-pay | Admitting: Registered Nurse

## 2018-08-09 ENCOUNTER — Other Ambulatory Visit: Payer: Self-pay

## 2018-08-09 ENCOUNTER — Encounter (HOSPITAL_COMMUNITY)
Admission: RE | Disposition: A | Discharge status: Home / Self Care | Payer: Self-pay | Source: Home / Self Care | Attending: Orthopaedic Surgery

## 2018-08-09 ENCOUNTER — Encounter (HOSPITAL_COMMUNITY): Payer: Self-pay | Admitting: *Deleted

## 2018-08-09 ENCOUNTER — Ambulatory Visit (HOSPITAL_COMMUNITY)
Admission: RE | Admit: 2018-08-09 | Discharge: 2018-08-09 | Disposition: A | Discharge status: Home / Self Care | Payer: Self-pay | Attending: Orthopaedic Surgery | Admitting: Orthopaedic Surgery

## 2018-08-09 DIAGNOSIS — K219 Gastro-esophageal reflux disease without esophagitis: Secondary | ICD-10-CM | POA: Insufficient documentation

## 2018-08-09 DIAGNOSIS — Z5309 Procedure and treatment not carried out because of other contraindication: Secondary | ICD-10-CM | POA: Insufficient documentation

## 2018-08-09 DIAGNOSIS — F17210 Nicotine dependence, cigarettes, uncomplicated: Secondary | ICD-10-CM | POA: Insufficient documentation

## 2018-08-09 DIAGNOSIS — F319 Bipolar disorder, unspecified: Secondary | ICD-10-CM | POA: Insufficient documentation

## 2018-08-09 DIAGNOSIS — I517 Cardiomegaly: Secondary | ICD-10-CM | POA: Insufficient documentation

## 2018-08-09 DIAGNOSIS — Z888 Allergy status to other drugs, medicaments and biological substances status: Secondary | ICD-10-CM | POA: Insufficient documentation

## 2018-08-09 DIAGNOSIS — R9431 Abnormal electrocardiogram [ECG] [EKG]: Secondary | ICD-10-CM | POA: Insufficient documentation

## 2018-08-09 DIAGNOSIS — Z01818 Encounter for other preprocedural examination: Secondary | ICD-10-CM | POA: Insufficient documentation

## 2018-08-09 DIAGNOSIS — J45909 Unspecified asthma, uncomplicated: Secondary | ICD-10-CM | POA: Insufficient documentation

## 2018-08-09 DIAGNOSIS — M1611 Unilateral primary osteoarthritis, right hip: Secondary | ICD-10-CM | POA: Diagnosis present

## 2018-08-09 DIAGNOSIS — Z79899 Other long term (current) drug therapy: Secondary | ICD-10-CM | POA: Insufficient documentation

## 2018-08-09 HISTORY — DX: Pneumonia, unspecified organism: J18.9

## 2018-08-09 HISTORY — DX: Gastro-esophageal reflux disease without esophagitis: K21.9

## 2018-08-09 HISTORY — DX: Unspecified lump in unspecified breast: N63.0

## 2018-08-09 HISTORY — DX: Anxiety disorder, unspecified: F41.9

## 2018-08-09 HISTORY — DX: Bipolar disorder, unspecified: F31.9

## 2018-08-09 HISTORY — DX: Unilateral primary osteoarthritis, unspecified hip: M16.10

## 2018-08-09 LAB — CBC WITH DIFFERENTIAL/PLATELET
Abs Immature Granulocytes: 0.03 10*3/uL (ref 0.00–0.07)
Basophils Absolute: 0 10*3/uL (ref 0.0–0.1)
Basophils Relative: 0 %
Eosinophils Absolute: 0 10*3/uL (ref 0.0–0.5)
Eosinophils Relative: 0 %
HCT: 38.9 % (ref 36.0–46.0)
Hemoglobin: 11.6 g/dL — ABNORMAL LOW (ref 12.0–15.0)
Immature Granulocytes: 0 %
Lymphocytes Relative: 28 %
Lymphs Abs: 2.6 10*3/uL (ref 0.7–4.0)
MCH: 21.7 pg — ABNORMAL LOW (ref 26.0–34.0)
MCHC: 29.8 g/dL — ABNORMAL LOW (ref 30.0–36.0)
MCV: 72.7 fL — ABNORMAL LOW (ref 80.0–100.0)
Monocytes Absolute: 0.7 10*3/uL (ref 0.1–1.0)
Monocytes Relative: 7 %
Neutro Abs: 5.9 10*3/uL (ref 1.7–7.7)
Neutrophils Relative %: 65 %
Platelets: 348 10*3/uL (ref 150–400)
RBC: 5.35 MIL/uL — ABNORMAL HIGH (ref 3.87–5.11)
RDW: 23.3 % — ABNORMAL HIGH (ref 11.5–15.5)
WBC: 9.2 10*3/uL (ref 4.0–10.5)
nRBC: 0 % (ref 0.0–0.2)

## 2018-08-09 LAB — TYPE AND SCREEN
ABO/RH(D): A POS
Antibody Screen: NEGATIVE

## 2018-08-09 LAB — COMPREHENSIVE METABOLIC PANEL
ALT: 81 U/L — ABNORMAL HIGH (ref 0–44)
AST: 51 U/L — ABNORMAL HIGH (ref 15–41)
Albumin: 3.2 g/dL — ABNORMAL LOW (ref 3.5–5.0)
Alkaline Phosphatase: 112 U/L (ref 38–126)
Anion gap: 11 (ref 5–15)
BUN: 8 mg/dL (ref 6–20)
CO2: 23 mmol/L (ref 22–32)
Calcium: 8.8 mg/dL — ABNORMAL LOW (ref 8.9–10.3)
Chloride: 102 mmol/L (ref 98–111)
Creatinine, Ser: 0.91 mg/dL (ref 0.44–1.00)
GFR calc Af Amer: >60 mL/min (ref >60)
GFR calc non Af Amer: >60 mL/min (ref >60)
Glucose, Bld: 102 mg/dL — ABNORMAL HIGH (ref 70–99)
Potassium: 3.7 mmol/L (ref 3.5–5.1)
Sodium: 136 mmol/L (ref 135–145)
Total Bilirubin: 0.4 mg/dL (ref 0.3–1.2)
Total Protein: 7.1 g/dL (ref 6.5–8.1)

## 2018-08-09 LAB — RAPID URINE DRUG SCREEN, HOSP PERFORMED
Amphetamines: NOT DETECTED
Barbiturates: NOT DETECTED
Benzodiazepines: NOT DETECTED
Cocaine: POSITIVE — AB
Opiates: POSITIVE — AB
Tetrahydrocannabinol: NOT DETECTED

## 2018-08-09 LAB — ABO/RH: ABO/RH(D): A POS

## 2018-08-09 LAB — PROTIME-INR
INR: 1 (ref 0.8–1.2)
Prothrombin Time: 13.5 seconds (ref 11.4–15.2)

## 2018-08-09 LAB — POCT PREGNANCY, URINE: Preg Test, Ur: NEGATIVE

## 2018-08-09 LAB — APTT: aPTT: 33 seconds (ref 24–36)

## 2018-08-09 SURGERY — ARTHROPLASTY, HIP, TOTAL, ANTERIOR APPROACH
Anesthesia: Spinal | Laterality: Right

## 2018-08-09 MED ORDER — PROPOFOL 1000 MG/100ML IV EMUL
INTRAVENOUS | Status: AC
  Filled 2018-08-09: qty 400

## 2018-08-09 MED ORDER — TRANEXAMIC ACID 1000 MG/10ML IV SOLN
INTRAVENOUS | Status: DC | PRN
  Administered 2018-08-09: 08:00:00 2000 mg via TOPICAL

## 2018-08-09 MED ORDER — TRANEXAMIC ACID-NACL 1000-0.7 MG/100ML-% IV SOLN: 1000.0000 mg | INTRAVENOUS | Status: DC

## 2018-08-09 MED ORDER — SENNOSIDES-DOCUSATE SODIUM 8.6-50 MG PO TABS: 1.0000 | ORAL_TABLET | Freq: Every evening | ORAL | 1 refills | Status: DC | PRN

## 2018-08-09 MED ORDER — LACTATED RINGERS IV SOLN
INTRAVENOUS | Status: DC | PRN
  Administered 2018-08-09: 07:00:00 via INTRAVENOUS

## 2018-08-09 MED ORDER — OXYCODONE-ACETAMINOPHEN 5-325 MG PO TABS: 1.0000 | ORAL_TABLET | Freq: Three times a day (TID) | ORAL | 0 refills | Status: DC | PRN

## 2018-08-09 MED ORDER — POVIDONE-IODINE 10 % EX SWAB: 2.0000 "application " | Freq: Once | CUTANEOUS | Status: DC

## 2018-08-09 MED ORDER — PROPOFOL 10 MG/ML IV BOLUS
INTRAVENOUS | Status: AC
  Filled 2018-08-09: qty 20

## 2018-08-09 MED ORDER — ONDANSETRON HCL 4 MG/2ML IJ SOLN
INTRAMUSCULAR | Status: AC
  Filled 2018-08-09: qty 2

## 2018-08-09 MED ORDER — ONDANSETRON HCL 4 MG PO TABS: 4.0000 mg | ORAL_TABLET | Freq: Three times a day (TID) | ORAL | 0 refills | Status: DC | PRN

## 2018-08-09 MED ORDER — DEXAMETHASONE SODIUM PHOSPHATE 10 MG/ML IJ SOLN
INTRAMUSCULAR | Status: AC
  Filled 2018-08-09: qty 1

## 2018-08-09 MED ORDER — CHLORHEXIDINE GLUCONATE 4 % EX LIQD: 60.0000 mL | Freq: Once | CUTANEOUS | Status: DC

## 2018-08-09 MED ORDER — PROMETHAZINE HCL 25 MG PO TABS: 25.0000 mg | ORAL_TABLET | Freq: Four times a day (QID) | ORAL | 1 refills | Status: DC | PRN

## 2018-08-09 MED ORDER — VANCOMYCIN HCL 1 G IV SOLR
INTRAVENOUS | Status: DC | PRN
  Administered 2018-08-09: 1000 mg

## 2018-08-09 MED ORDER — SULFAMETHOXAZOLE-TRIMETHOPRIM 800-160 MG PO TABS: 1.0000 | ORAL_TABLET | Freq: Two times a day (BID) | ORAL | 0 refills | Status: DC

## 2018-08-09 MED ORDER — CEFAZOLIN SODIUM-DEXTROSE 2-4 GM/100ML-% IV SOLN
2.0000 g | INTRAVENOUS | Status: DC
  Filled 2018-08-09: qty 100

## 2018-08-09 MED ORDER — VANCOMYCIN HCL 1000 MG IV SOLR
INTRAVENOUS | Status: AC
  Filled 2018-08-09: qty 1000

## 2018-08-09 MED ORDER — ACETAMINOPHEN 500 MG PO TABS
1000.0000 mg | ORAL_TABLET | Freq: Once | ORAL | Status: AC
  Administered 2018-08-09: 1000 mg via ORAL
  Filled 2018-08-09: qty 2

## 2018-08-09 MED ORDER — 0.9 % SODIUM CHLORIDE (POUR BTL) OPTIME
TOPICAL | Status: DC | PRN
  Administered 2018-08-09: 08:00:00 1000 mL

## 2018-08-09 MED ORDER — FENTANYL CITRATE (PF) 250 MCG/5ML IJ SOLN
INTRAMUSCULAR | Status: AC
  Filled 2018-08-09: qty 5

## 2018-08-09 MED ORDER — MIDAZOLAM HCL 2 MG/2ML IJ SOLN
INTRAMUSCULAR | Status: AC
  Filled 2018-08-09: qty 2

## 2018-08-09 MED ORDER — METHOCARBAMOL 750 MG PO TABS: 750.0000 mg | ORAL_TABLET | Freq: Two times a day (BID) | ORAL | 0 refills | Status: DC | PRN

## 2018-08-09 MED ORDER — ASPIRIN EC 81 MG PO TBEC: 81.0000 mg | DELAYED_RELEASE_TABLET | Freq: Two times a day (BID) | ORAL | 0 refills | Status: DC

## 2018-08-09 MED ORDER — LACTATED RINGERS IV SOLN: INTRAVENOUS | Status: DC

## 2018-08-09 NOTE — Progress Notes (Signed)
Notified that case was cancelled due to abnormal urine drug screen. IV d/c'd. Patient ambulated with Lilian Kapur to main entrance A.

## 2018-08-09 NOTE — Progress Notes (Signed)
Dr. Erlinda Hong made aware of pt's last drug use

## 2018-08-09 NOTE — Discharge Instructions (Signed)

## 2018-08-09 NOTE — H&P (Signed)
PREOPERATIVE H&P  Chief Complaint: right hip degenerative joint disease  HPI: Brenda [name] is a 48 y.o. female who presents for surgical treatment of right hip degenerative joint disease.  She denies any changes in medical history.  Past Medical History:  Diagnosis Date  . Ankle fracture, left    in past.  . Anxiety   . Arthritis    Right hip, scheduled for replacement 08/09/18  . Asthma   . Bipolar disorder ([place])   . Chronic hepatitis C ([place])   . Depression   . GERD (gastroesophageal reflux disease)    pmh  . Lump in female breast    right  . Pneumonia   . Polysubstance abuse ([place])   . Primary localized osteoarthritis of hip    right  . Wears dentures    full upper   Past Surgical History:  Procedure Laterality Date  . TUBAL LIGATION     Social History   Socioeconomic History  . Marital status: Single    Spouse name: Not on file  . Number of children: Not on file  . Years of education: Not on file  . Highest education level: Not on file  Occupational History  . Not on file  Social Needs  . Financial resource strain: Not on file  . Food insecurity    Worry: Not on file    Inability: Not on file  . Transportation needs    Medical: Not on file    Non-medical: Not on file  Tobacco Use  . Smoking status: Current Every Day Smoker    Packs/day: 1.00    Years: 30.00    Pack years: 30.00    Types: Cigarettes  . Smokeless tobacco: Never Used  . Tobacco comment: since age 43  Substance and Sexual Activity  . Alcohol use: No  . Drug use: Not Currently    Frequency: 7.0 times per week    Types: Cocaine    Comment: uses heroin for pain. Statesw last use around 07/20/18  . Sexual activity: Yes    Birth control/protection: None  Lifestyle  . Physical activity    Days per week: Not on file    Minutes per session: Not on file  . Stress: Not on file  Relationships  . Social Herbalist on phone: Not on file    Gets together: Not on file   Attends religious service: Not on file    Active member of club or organization: Not on file    Attends meetings of clubs or organizations: Not on file    Relationship status: Not on file  Other Topics Concern  . Not on file  Social History Narrative  . Not on file   History reviewed. No pertinent family history. Allergies  Allergen Reactions  . Flexeril [Cyclobenzaprine] Other (See Comments)    RLS-like symptoms   . Seroquel [Quetiapine Fumarate] Other (See Comments)    RLS-like symptoms   Prior to Admission medications   Medication Sig Start Date End Date Taking? Authorizing Provider  albuterol (PROVENTIL HFA;VENTOLIN HFA) 108 (90 Base) MCG/ACT inhaler Inhale 2 puffs into the lungs every 4 (four) hours as needed for wheezing or shortness of breath. 04/30/18  Yes Arta Silence, MD  cyclobenzaprine (FLEXERIL) 5 MG tablet Take 1 tablet (5 mg total) by mouth 3 (three) times daily. Patient taking differently: Take 5 mg by mouth 3 (three) times daily as needed for muscle spasms.  06/22/18  Yes Iloabachie, Chioma E,  NP  diltiazem (CARDIZEM CD) 120 MG 24 hr capsule Take 1 capsule (120 mg total) by mouth daily. 07/14/18  Yes Iloabachie, Chioma E, NP  ferrous sulfate 325 (65 FE) MG tablet Take 1 tablet (325 mg total) by mouth daily with breakfast. 07/14/18  Yes Iloabachie, Chioma E, NP  ibuprofen (ADVIL) 800 MG tablet Take 1 tablet (800 mg total) by mouth every 8 (eight) hours as needed for moderate pain. Patient taking differently: Take 800 mg by mouth daily as needed for moderate pain.  07/14/18  Yes Iloabachie, Chioma E, NP     Positive ROS: All other systems have been reviewed and were otherwise negative with the exception of those mentioned in the HPI and as above.  Physical Exam: General: Alert, no acute distress Cardiovascular: No pedal edema Respiratory: No cyanosis, no use of accessory musculature GI: abdomen soft Skin: No lesions in the area of chief complaint Neurologic:  Sensation intact distally Psychiatric: Patient is competent for consent with normal mood and affect Lymphatic: no lymphedema  MUSCULOSKELETAL: exam stable  Assessment: right hip degenerative joint disease  Plan: Plan for Procedure(s): RIGHT TOTAL HIP ARTHROPLASTY ANTERIOR APPROACH  The risks benefits and alternatives were discussed with the patient including but not limited to the risks of nonoperative treatment, versus surgical intervention including infection, bleeding, nerve injury,  blood clots, cardiopulmonary complications, morbidity, mortality, among others, and they were willing to proceed.   Preoperative templating of the joint replacement has been completed, documented, and submitted to the Operating Room personnel in order to optimize intra-operative equipment management.  Glee ArvinMichael Xu, MD   08/09/2018 6:56 AM

## 2018-08-09 NOTE — Progress Notes (Signed)
Surgery cancelled due to +cocaine on UDS.  Patient admits to using cocaine this morning.  I have discussed that we will reschedule her surgery for the near future but she must abstain from cocaine and other illicit drug use.

## 2018-08-09 NOTE — Progress Notes (Signed)
UDS obtained, awaiting results. Pt reports last heroin use 48 hours ago. Last Cocaine 1 week ago. Dr. Lanetta Inch made aware.

## 2018-08-10 ENCOUNTER — Ambulatory Visit: Payer: Medicaid Other | Admitting: Gerontology

## 2018-08-10 ENCOUNTER — Other Ambulatory Visit: Payer: Self-pay

## 2018-08-10 MED ORDER — MAVYRET 100-40 MG PO TABS: 3.0000 | ORAL_TABLET | Freq: Every day | ORAL | 1 refills | Status: DC

## 2018-08-11 ENCOUNTER — Encounter: Payer: Medicaid Other | Admitting: Obstetrics and Gynecology

## 2018-08-12 ENCOUNTER — Ambulatory Visit: Payer: Self-pay

## 2018-08-12 ENCOUNTER — Telehealth: Payer: Self-pay | Admitting: Orthopaedic Surgery

## 2018-08-12 NOTE — Telephone Encounter (Signed)
Patient called and wanted to know status on medication request (Perocet) Due to transportation issues she was wanting to know when this will be called in.  Please call patient to advise [phone]

## 2018-08-12 NOTE — Telephone Encounter (Signed)
I sent in #30 tabs for her on Monday.  She's using it too often.  She may have tramadol refills until she has surgery.

## 2018-08-12 NOTE — Telephone Encounter (Signed)
IC advised pending response from Dr Rosine Door

## 2018-08-12 NOTE — Telephone Encounter (Signed)
What do you think??

## 2018-08-12 NOTE — Telephone Encounter (Signed)
Patient called needing Rx refilled (Percocet)  The number to contact patient is [phone]

## 2018-08-12 NOTE — Telephone Encounter (Signed)
pls advise

## 2018-08-12 NOTE — Telephone Encounter (Signed)
I s/w patient and advised. She said that nothing else works for her pain. She is asking if they could be refilled tomorrow?

## 2018-08-13 ENCOUNTER — Telehealth: Payer: Self-pay | Admitting: Orthopaedic Surgery

## 2018-08-13 ENCOUNTER — Other Ambulatory Visit: Payer: Self-pay | Admitting: Physician Assistant

## 2018-08-13 NOTE — Telephone Encounter (Signed)
She was scheduled for 07/13 but it was cancelled. Not sure if it has been rescheduled yet.

## 2018-08-13 NOTE — Telephone Encounter (Signed)
Only tramadol until surgery.  Let me know if she would like that

## 2018-08-13 NOTE — Telephone Encounter (Signed)
Duplicate message - Mendel Ryder was sending in tramadol, patient refused stated that doesn't help her.

## 2018-08-13 NOTE — Telephone Encounter (Signed)
Patient called back stating she runs out of medication tonight and will not have any for the weekend. She's still in pain and was hoping this could help her pain until surgery  [phone]

## 2018-08-13 NOTE — Telephone Encounter (Signed)
Patient refused. Stated Tramadol doesn't help her.

## 2018-08-13 NOTE — Telephone Encounter (Signed)
IC advised. Michela Pitcher nothing else works

## 2018-08-13 NOTE — Telephone Encounter (Signed)
Ok to call in tramadol 50mg  1 tab q6 hours prn pain #30, no refills

## 2018-08-13 NOTE — Telephone Encounter (Signed)
When is her surgery rescheduled for?

## 2018-08-13 NOTE — Telephone Encounter (Signed)
Please advise.. I believe there is a duplicate message that has previously been sent

## 2018-08-13 NOTE — Telephone Encounter (Signed)
Yeah she was cancelled because she was not forthcoming about her cocaine use.

## 2018-08-16 NOTE — Telephone Encounter (Signed)
ok 

## 2018-08-19 ENCOUNTER — Encounter: Payer: Medicaid Other | Admitting: Obstetrics and Gynecology

## 2018-08-22 DIAGNOSIS — R600 Localized edema: Secondary | ICD-10-CM | POA: Insufficient documentation

## 2018-08-22 NOTE — Progress Notes (Deleted)
No SHow

## 2018-08-23 ENCOUNTER — Ambulatory Visit: Payer: Self-pay | Admitting: Cardiovascular Disease

## 2018-08-24 ENCOUNTER — Ambulatory Visit: Payer: Medicaid Other | Admitting: Gerontology

## 2018-08-24 ENCOUNTER — Other Ambulatory Visit: Payer: Self-pay

## 2018-08-24 ENCOUNTER — Inpatient Hospital Stay: Payer: Medicaid Other | Admitting: Physician Assistant

## 2018-08-24 ENCOUNTER — Emergency Department
Admission: EM | Admit: 2018-08-24 | Discharge: 2018-08-24 | Disposition: A | Discharge status: Home / Self Care | Payer: Medicaid Other | Attending: Emergency Medicine | Admitting: Emergency Medicine

## 2018-08-24 ENCOUNTER — Telehealth: Payer: Self-pay

## 2018-08-24 ENCOUNTER — Emergency Department: Payer: Medicaid Other

## 2018-08-24 DIAGNOSIS — Y9389 Activity, other specified: Secondary | ICD-10-CM | POA: Insufficient documentation

## 2018-08-24 DIAGNOSIS — S93402A Sprain of unspecified ligament of left ankle, initial encounter: Secondary | ICD-10-CM

## 2018-08-24 DIAGNOSIS — S80211A Abrasion, right knee, initial encounter: Secondary | ICD-10-CM

## 2018-08-24 DIAGNOSIS — Y998 Other external cause status: Secondary | ICD-10-CM | POA: Insufficient documentation

## 2018-08-24 DIAGNOSIS — W19XXXA Unspecified fall, initial encounter: Secondary | ICD-10-CM

## 2018-08-24 DIAGNOSIS — Z79899 Other long term (current) drug therapy: Secondary | ICD-10-CM | POA: Insufficient documentation

## 2018-08-24 DIAGNOSIS — J45909 Unspecified asthma, uncomplicated: Secondary | ICD-10-CM | POA: Insufficient documentation

## 2018-08-24 DIAGNOSIS — S7001XA Contusion of right hip, initial encounter: Secondary | ICD-10-CM

## 2018-08-24 DIAGNOSIS — S8001XA Contusion of right knee, initial encounter: Secondary | ICD-10-CM

## 2018-08-24 DIAGNOSIS — Y92009 Unspecified place in unspecified non-institutional (private) residence as the place of occurrence of the external cause: Secondary | ICD-10-CM | POA: Insufficient documentation

## 2018-08-24 IMAGING — CR LEFT ANKLE COMPLETE - 3+ VIEW
1 series · 3 of 3 positions shown · non-contrast
Comparison: [DATE]

CLINICAL DATA: Fall, left ankle pain

EXAM:
LEFT ANKLE COMPLETE - 3+ VIEW

[Series 1: dg ankle complete left · 0.14mm/px · 3 of 3 slices shown]
[im 1/3]
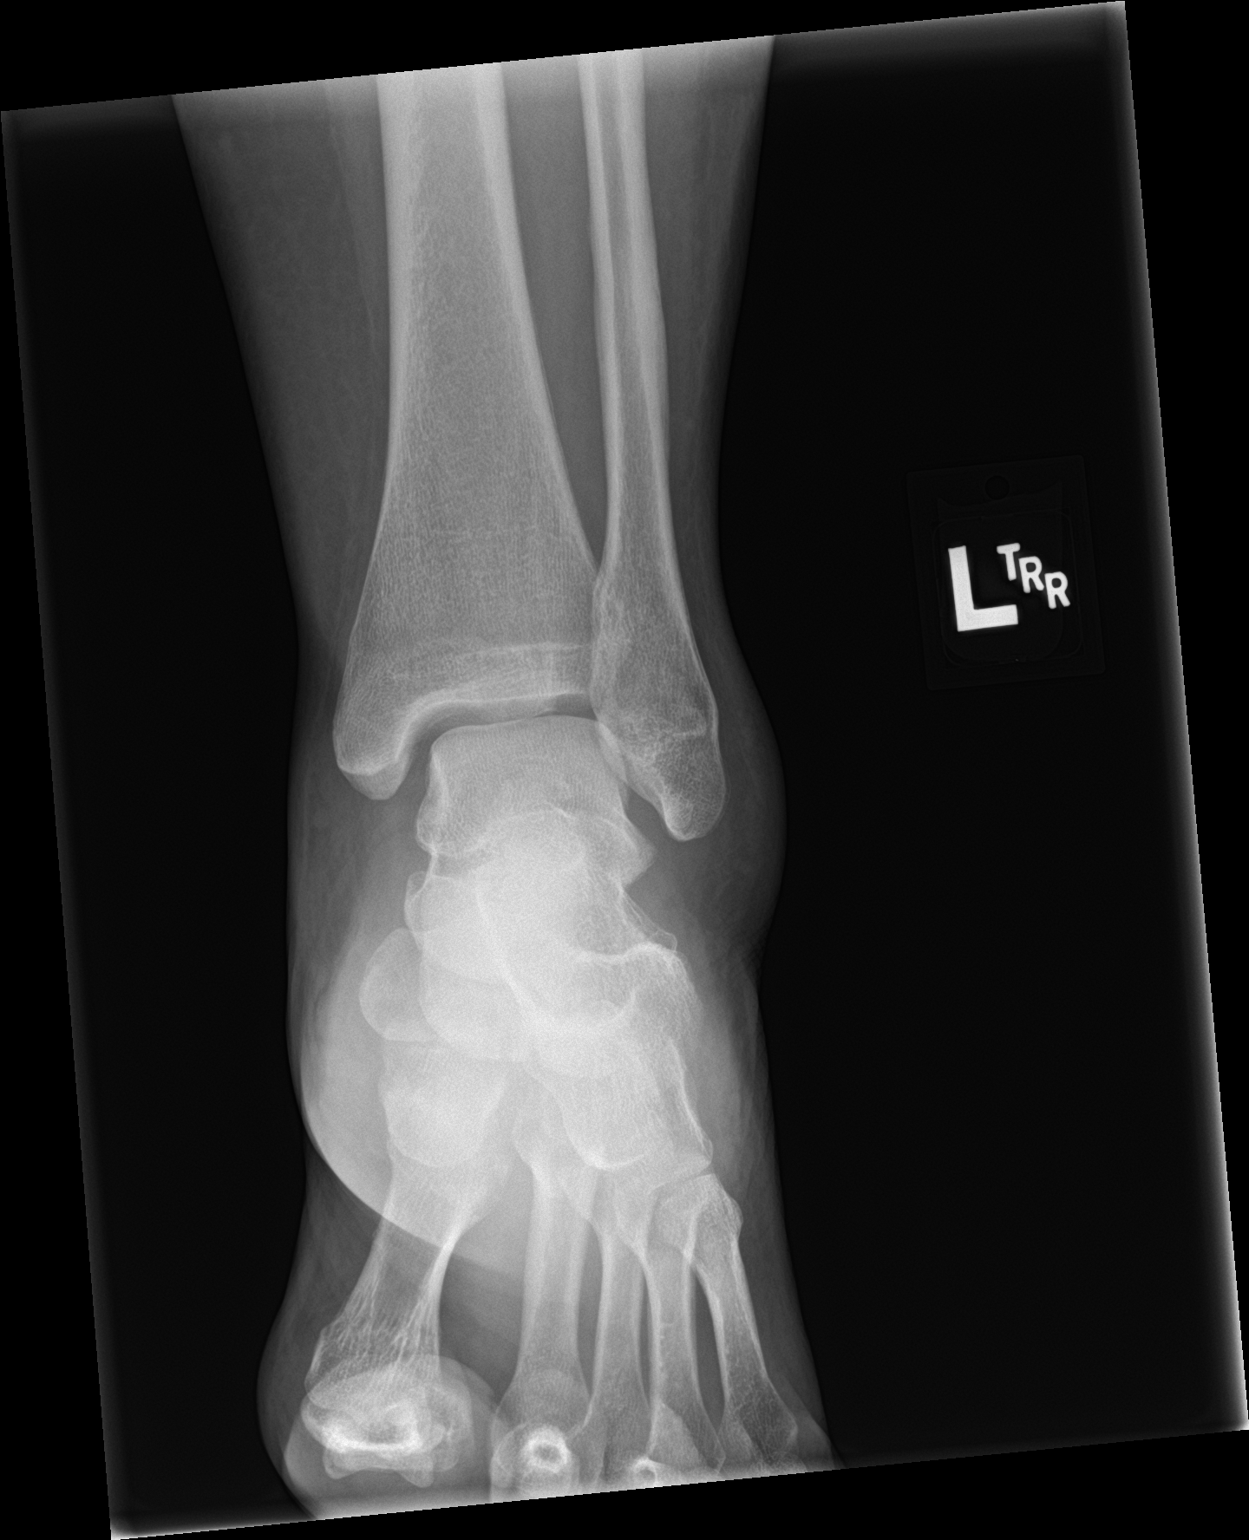
[im 2/3]
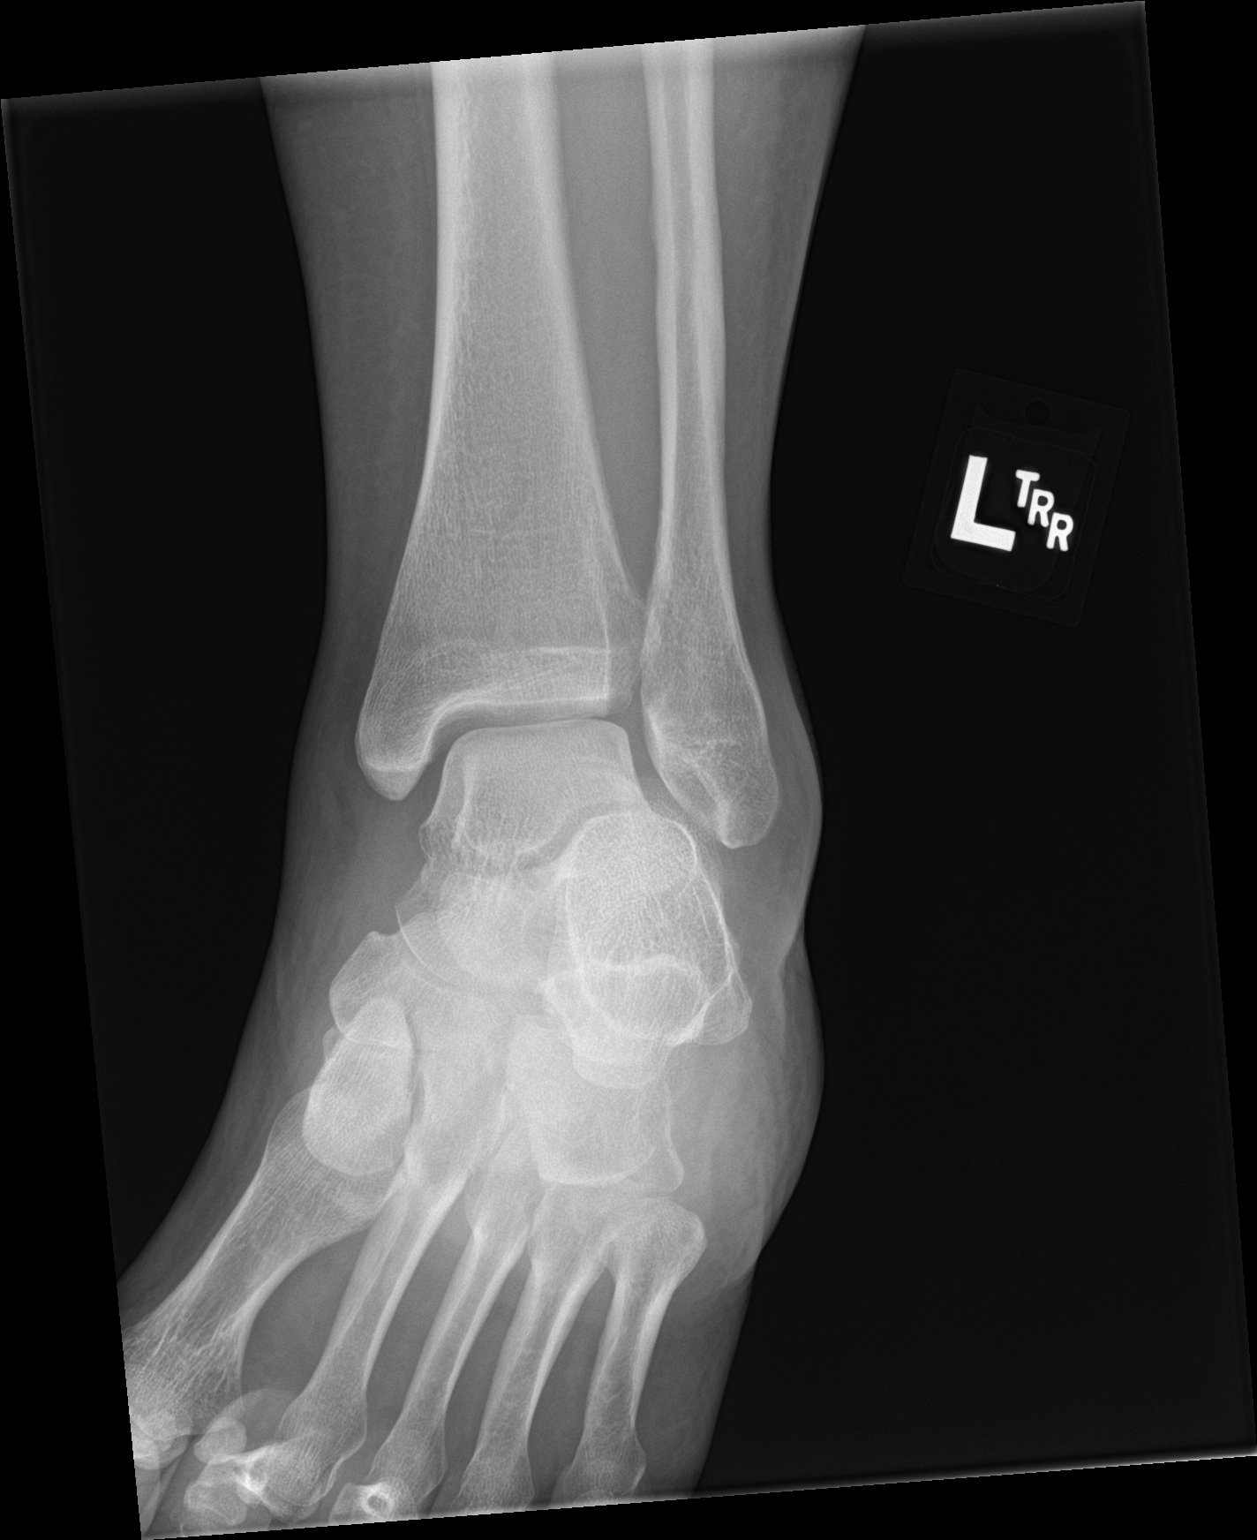
[im 3/3]
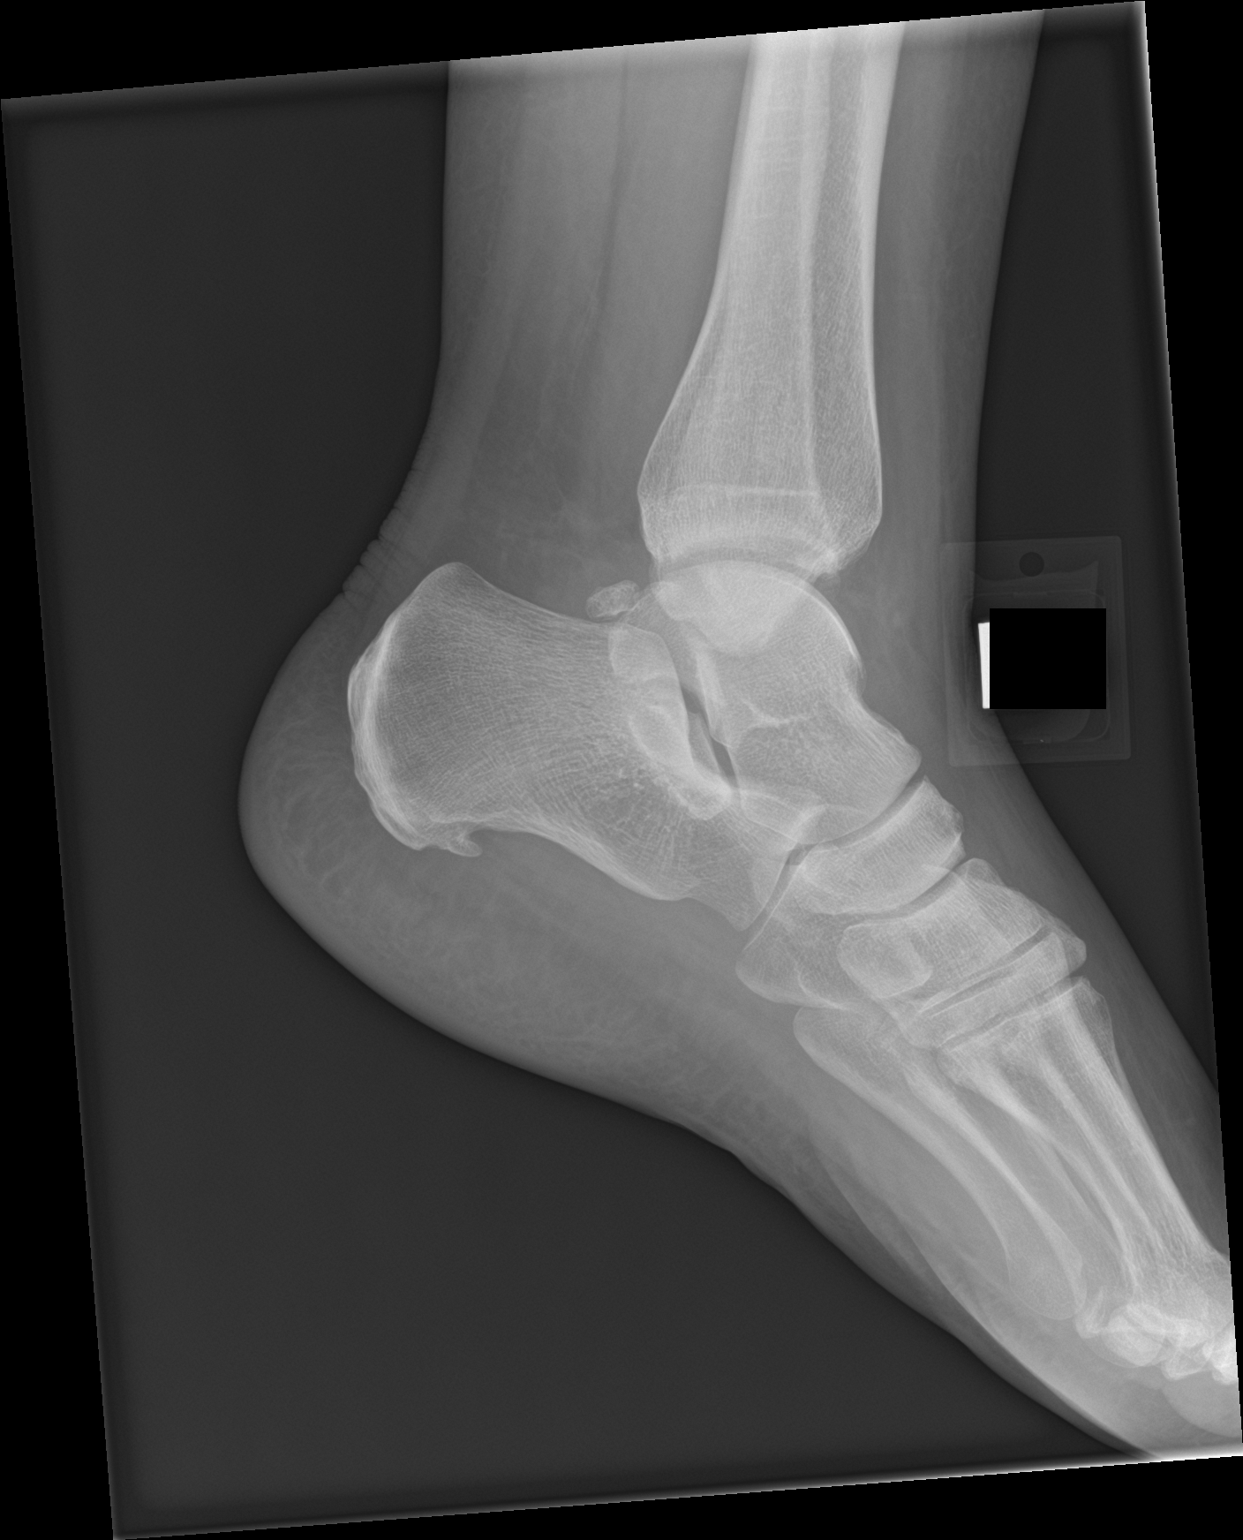

[3 of 3 positions shown; findings below may reference images not displayed]

FINDINGS: No fracture or dislocation is seen.

The ankle mortise is intact.

The base of the fifth metatarsal is unremarkable.

Small plantar calcaneal enthesophyte.

Mild lateral soft tissue swelling.
IMPRESSION: No fracture or dislocation is seen.

Mild lateral soft tissue swelling.

## 2018-08-24 IMAGING — CR RIGHT KNEE - COMPLETE 4+ VIEW
1 series · 4 of 4 positions shown · non-contrast
Comparison: None.

CLINICAL DATA: Fall, right knee pain

EXAM:
RIGHT KNEE - COMPLETE 4+ VIEW

[Series 1: dg knee complete 4 views right · 0.14mm/px · 4 of 4 slices shown]
[im 1/4]
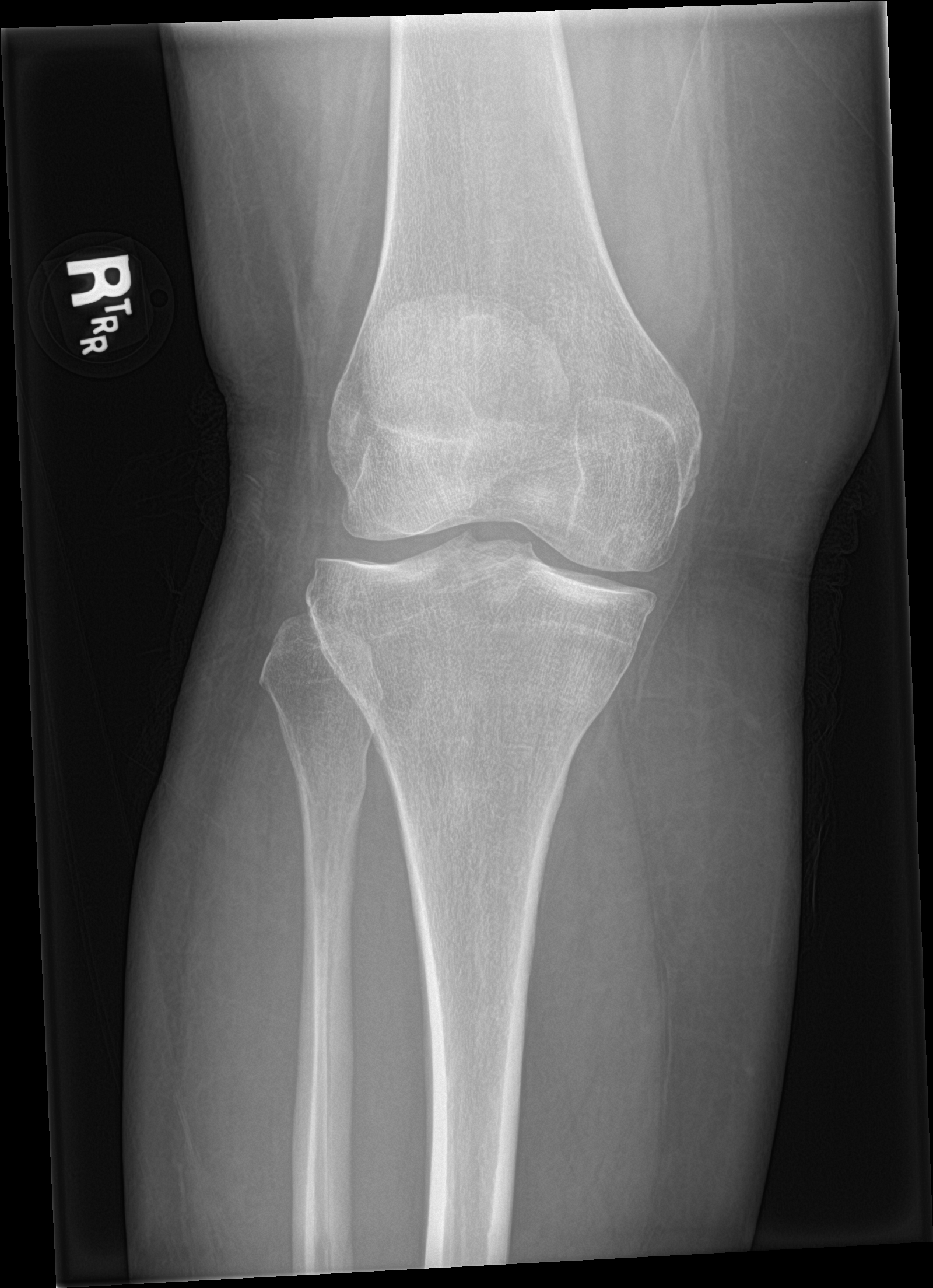
[im 2/4]
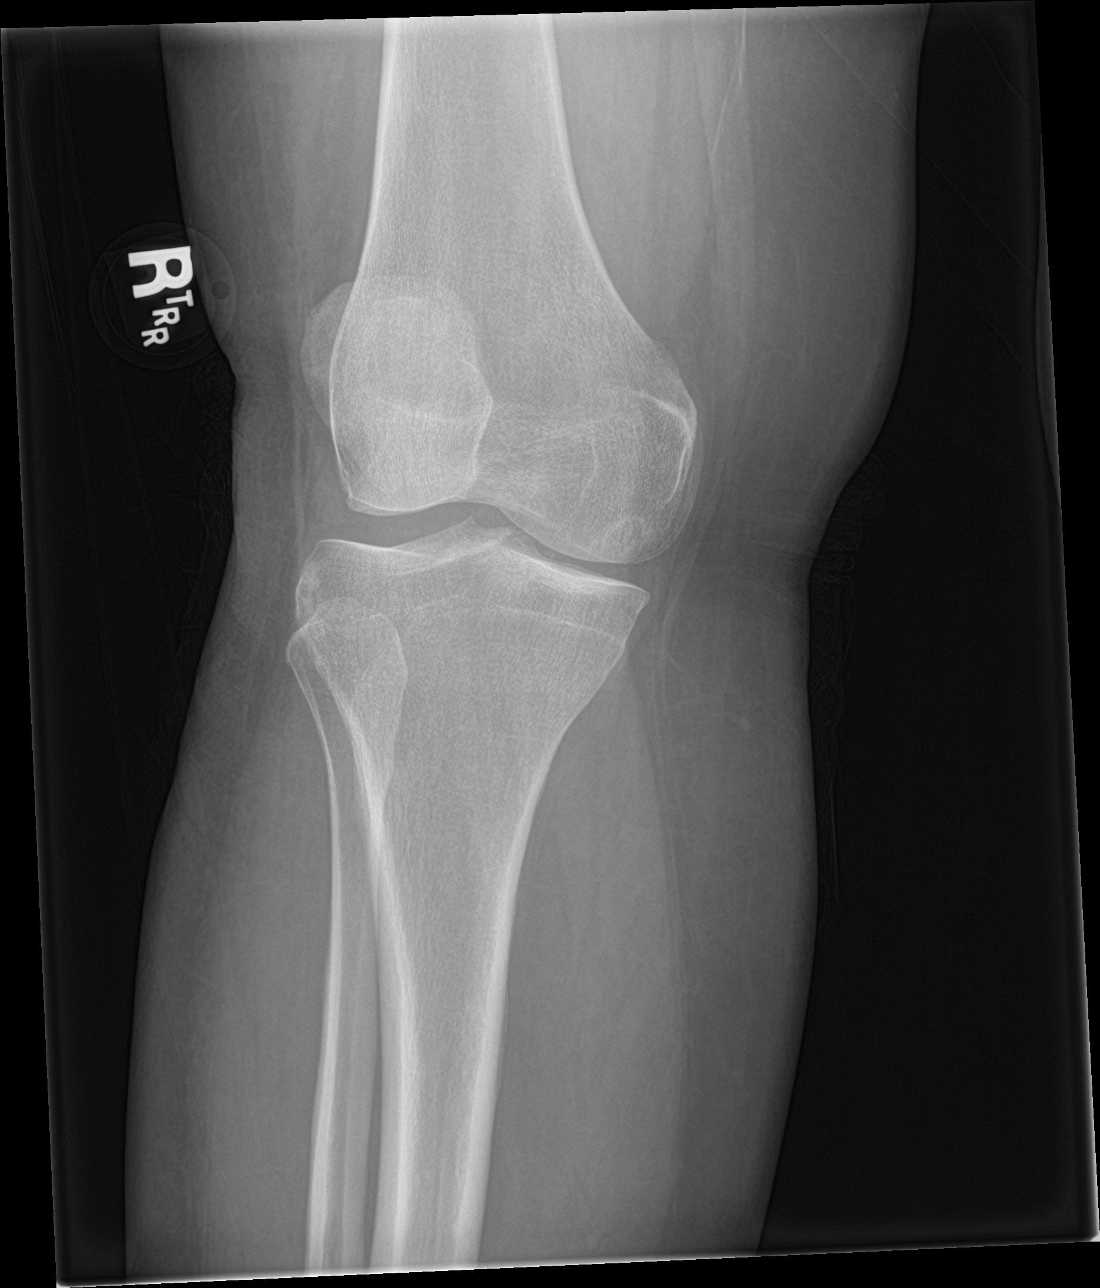
[im 3/4]
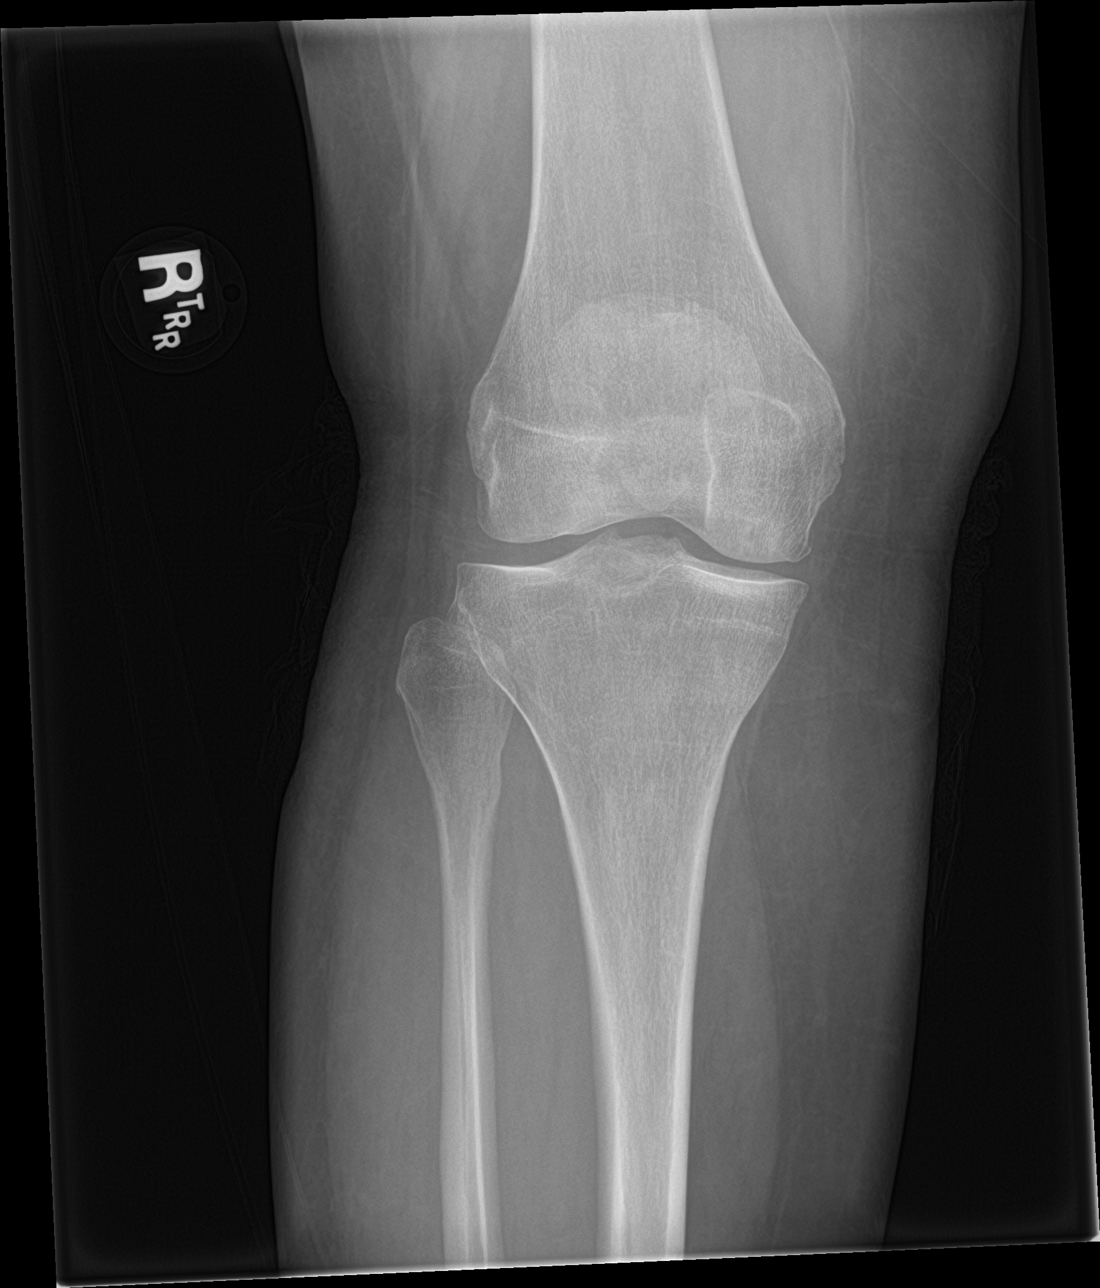
[im 4/4]
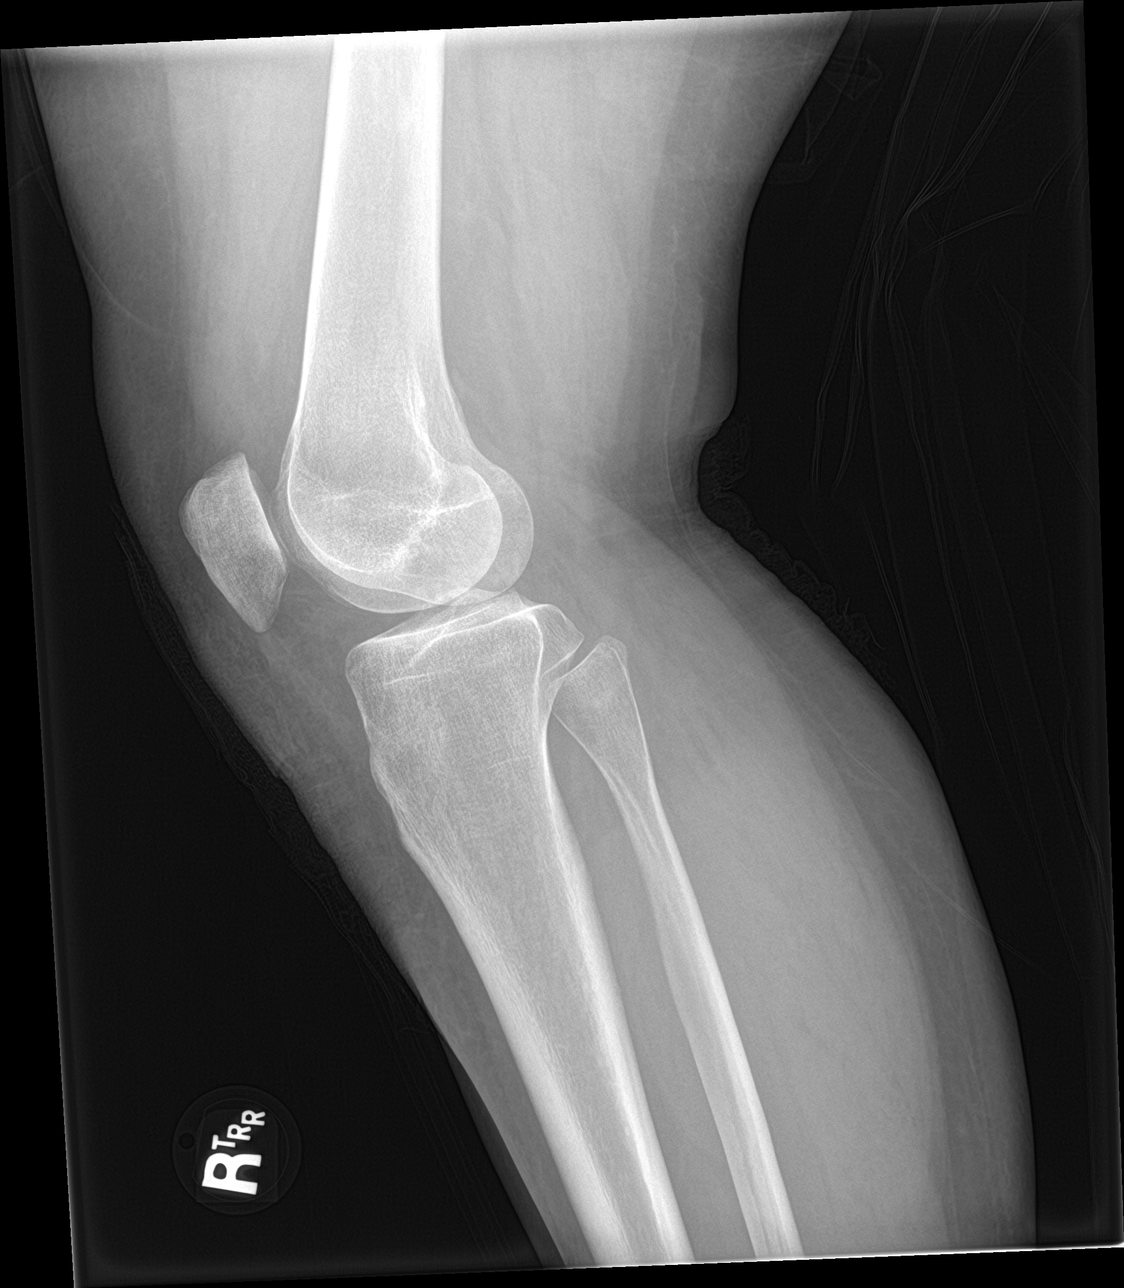

[4 of 4 positions shown; findings below may reference images not displayed]

FINDINGS: No fracture or dislocation is seen.

The joint spaces are preserved.

The visualized soft tissues are unremarkable.

Small suprapatellar knee joint effusion.
IMPRESSION: No fracture or dislocation is seen.

Small suprapatellar knee joint effusion.

## 2018-08-24 IMAGING — CR DG HIP (WITH OR WITHOUT PELVIS) 2-3V RIGHT
1 series · 3 of 3 positions shown · non-contrast
Comparison: None.

CLINICAL DATA: Fall, right hip pain

EXAM:
DG HIP (WITH OR WITHOUT PELVIS) 2-3V RIGHT

[Series 1: dg hip unilat w or w/o pelvis 2-3 views  · non-contrast · 0.14mm/px · 3 of 3 slices shown]
[im 1/3]
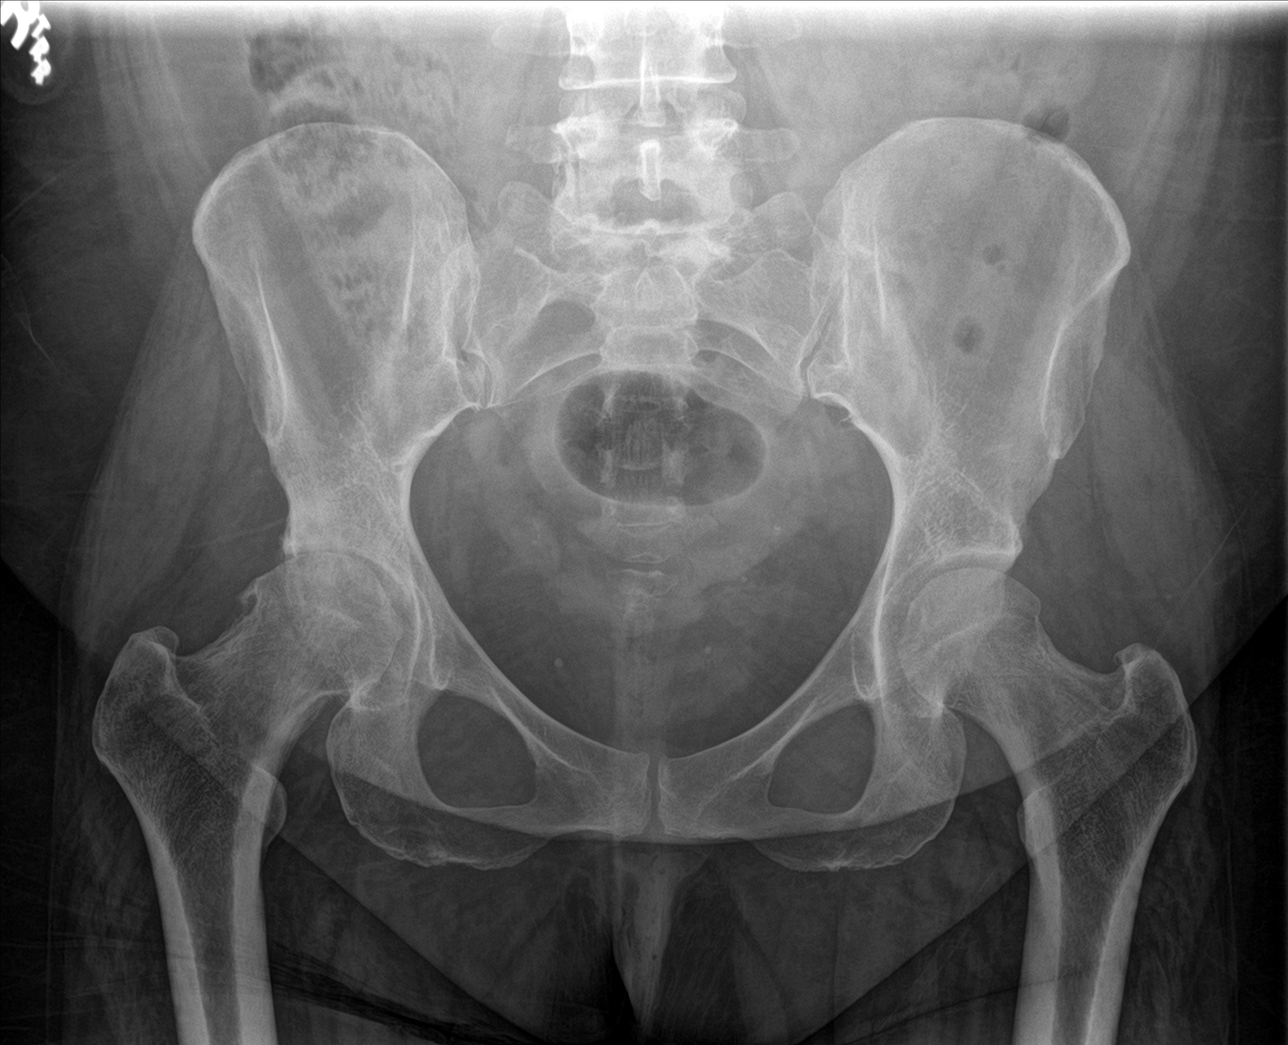
[im 2/3]
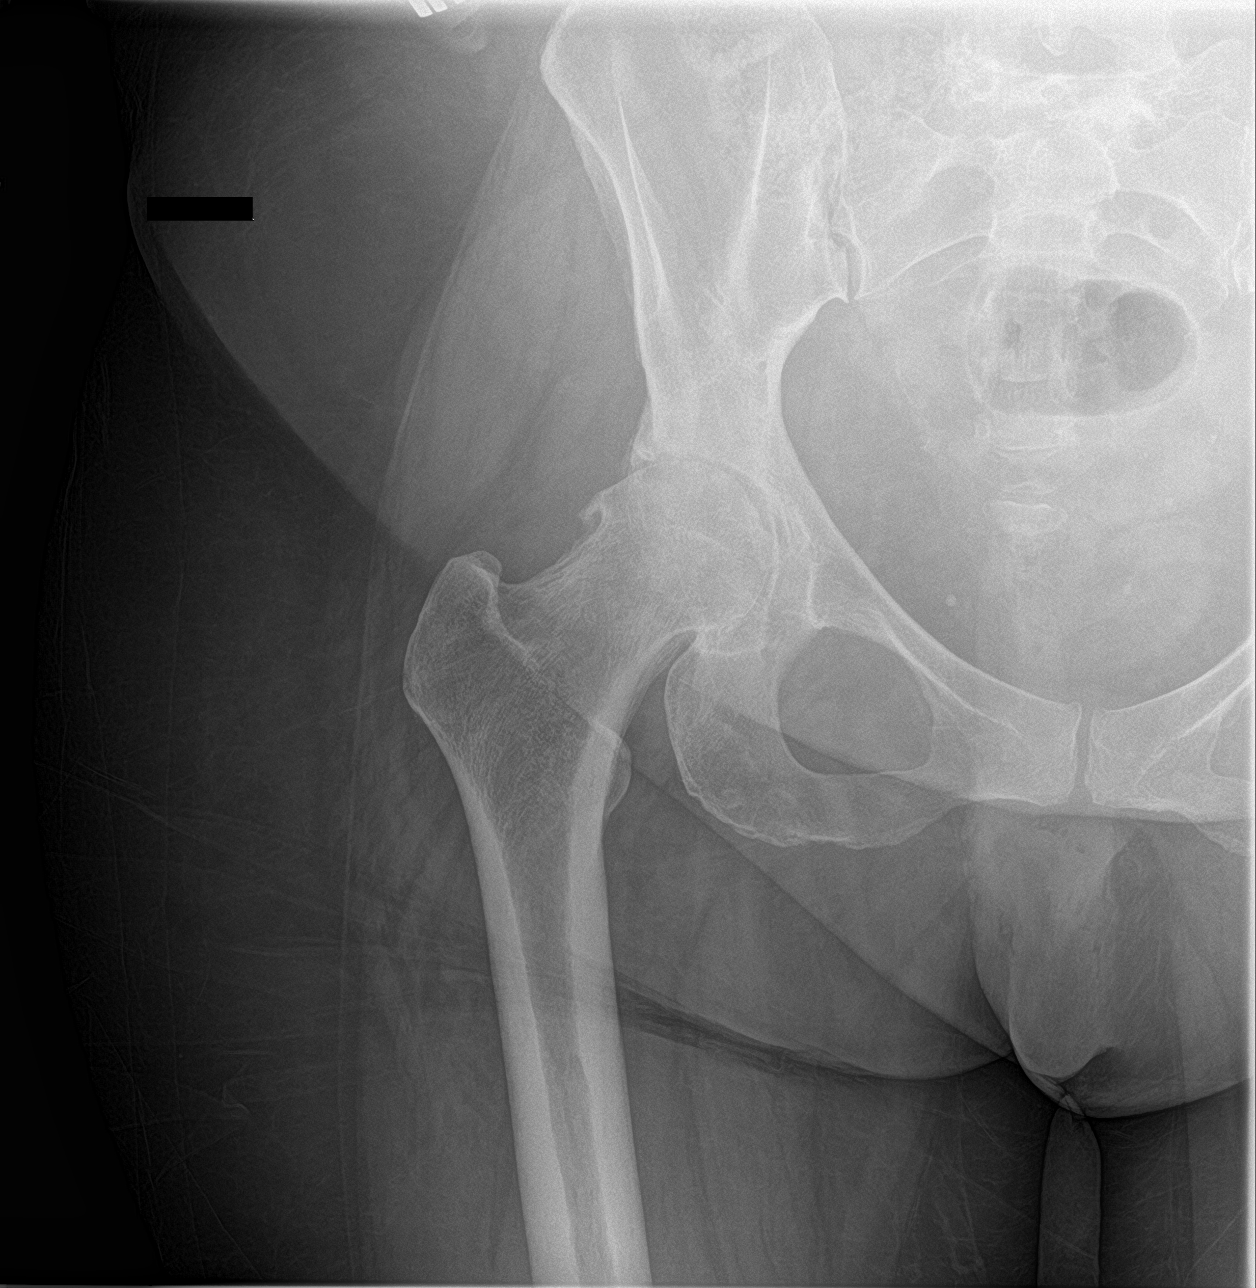
[im 3/3]
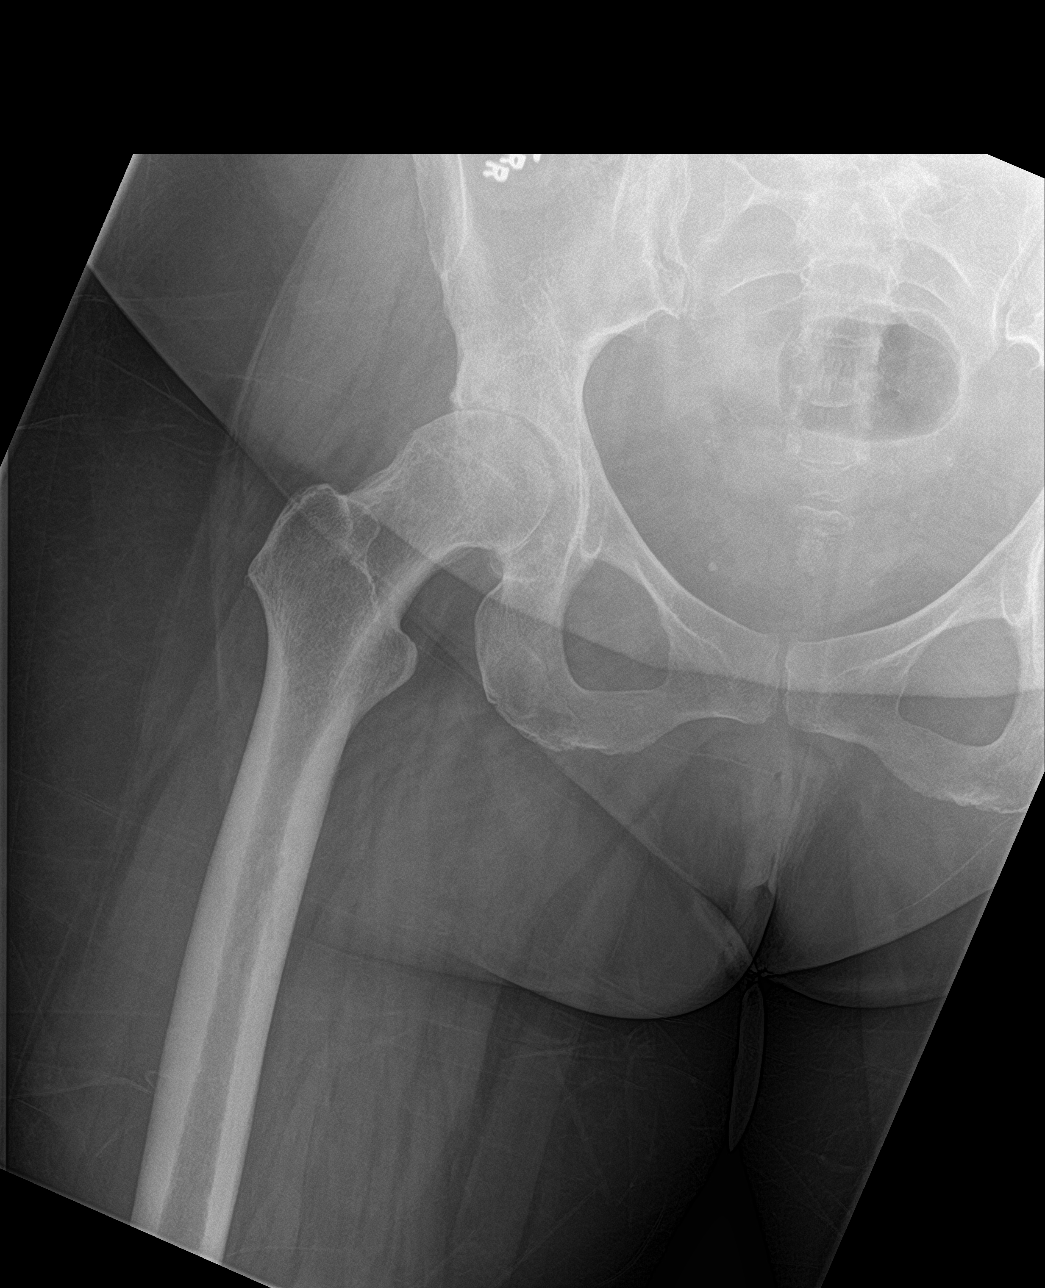

[3 of 3 positions shown; findings below may reference images not displayed]

FINDINGS: No fracture or dislocation is seen.

Mild to moderate degenerative changes of the right hip. Left hip
joint space is preserved.

Visualized bony pelvis appears intact.
IMPRESSION: No fracture or dislocation is seen.

Mild to moderate degenerative changes of the right hip.

## 2018-08-24 MED ORDER — OXYCODONE-ACETAMINOPHEN 5-325 MG PO TABS: 1.0000 | ORAL_TABLET | Freq: Four times a day (QID) | ORAL | 0 refills | Status: DC | PRN

## 2018-08-24 MED ORDER — OXYCODONE-ACETAMINOPHEN 5-325 MG PO TABS
1.0000 | ORAL_TABLET | Freq: Once | ORAL | Status: AC
  Administered 2018-08-24: 07:00:00 1 via ORAL
  Filled 2018-08-24: qty 1

## 2018-08-24 NOTE — ED Notes (Signed)
See triage note  States her knee gave out and she fell  Twisted left ankle and landed on right knee   Abrasions noted to right knee   States pain is shooting up into hip area

## 2018-08-24 NOTE — Telephone Encounter (Signed)
Called patient x2 for a tele health visit and patient did not answer. LVM for patient to reschedule appointment.

## 2018-08-24 NOTE — ED Triage Notes (Signed)
Patient reports fall today. Patient c/o left ankle pain/swelling post fall (hx of previous fracture to that ankle. Patient c/o right knee pain/abrasion. Patient c/o right hip pain. Patient is already scheduled to have right hip replacement on 8/10, reports she may have damaged hip more in this fall.

## 2018-08-24 NOTE — Discharge Instructions (Addendum)
Call your orthopedist if any continued problems.  Ice and elevate your knee and ankle to decrease swelling which will help with pain.  Wear Ace wrap for support and protection.  Clean your abrasion on your knee daily with mild soap and water and watch for any signs of infection.  Percocet is every 6 hours if needed for pain.  Be aware that this medication could cause drowsiness and increase your risk for falling.

## 2018-08-24 NOTE — ED Provider Notes (Signed)
[facility] Emergency Department Provider Note  ____________________________________________   None    (approximate)  I have reviewed the triage vital signs and the nursing notes.   HISTORY  Chief Complaint Fall   HPI Brenda [name] is a 48 y.o. female presents to the ED with complaint of left ankle pain, right knee pain and right hip pain.  Patient states that she fell yesterday afternoon while at home.  She denies any head injury or loss of consciousness.  Patient is concerned as she is having a hip replacement August 10 is afraid that she is injured her hip preventing her from having surgery.  She also has an abrasion to her right knee.  She states that her tetanus has been within the last 5 years.  She rates her pain as a 9/10.       Past Medical History:  Diagnosis Date  . Ankle fracture, left    in past.  . Anxiety   . Arthritis    Right hip, scheduled for replacement 08/09/18  . Asthma   . Bipolar disorder (HCC)   . Chronic hepatitis C (HCC)   . Depression   . GERD (gastroesophageal reflux disease)    [facility]  . Lump in female breast    right  . Pneumonia   . Polysubstance abuse (HCC)   . Primary localized osteoarthritis of hip    right  . Wears dentures    full upper    Patient Active Problem List   Diagnosis Date Noted  . Leg edema 08/22/2018  . Primary osteoarthritis of right hip 07/27/2018  . Vaginal discharge 07/14/2018  . Sepsis (HCC) 04/30/2018  . Opioid use disorder, moderate, dependence (HCC) 05/02/2016  . Tobacco use disorder 05/02/2016  . Severe recurrent major depression without psychotic features (HCC) 05/01/2016  . Cocaine use disorder, moderate, dependence (HCC) 05/01/2016  . Alcohol abuse 05/01/2016  . GERD (gastroesophageal reflux disease) 05/01/2016    Past Surgical History:  Procedure Laterality Date  . TUBAL LIGATION      Prior to Admission medications   Medication Sig Start Date End Date Taking?  Authorizing Provider  albuterol (PROVENTIL HFA;VENTOLIN HFA) 108 (90 Base) MCG/ACT inhaler Inhale 2 puffs into the lungs every 4 (four) hours as needed for wheezing or shortness of breath. 04/30/18   Dionne BucySiadecki, Sebastian, MD  aspirin EC 81 MG tablet Take 1 tablet (81 mg total) by mouth 2 (two) times daily. 08/09/18   Tarry KosXu, Naiping M, MD  diltiazem (CARDIZEM CD) 120 MG 24 hr capsule Take 1 capsule (120 mg total) by mouth daily. 07/14/18   Iloabachie, Chioma E, NP  ferrous sulfate 325 (65 FE) MG tablet Take 1 tablet (325 mg total) by mouth daily with breakfast. 07/14/18   Iloabachie, Chioma E, NP  Glecaprevir-Pibrentasvir (MAVYRET) 100-40 MG TABS Take 3 tablets by mouth daily. 08/10/18   Midge MiniumWohl, Darren, MD  ibuprofen (ADVIL) 800 MG tablet Take 1 tablet (800 mg total) by mouth every 8 (eight) hours as needed for moderate pain. Patient taking differently: Take 800 mg by mouth daily as needed for moderate pain.  07/14/18   Iloabachie, Chioma E, NP  methocarbamol (ROBAXIN) 750 MG tablet Take 1 tablet (750 mg total) by mouth 2 (two) times daily as needed for muscle spasms. 08/09/18   Tarry KosXu, Naiping M, MD  oxyCODONE-acetaminophen (PERCOCET) 5-325 MG tablet Take 1 tablet by mouth every 6 (six) hours as needed for severe pain. 08/24/18 08/24/19  Tommi RumpsSummers, Sadia Belfiore L, PA-C  promethazine (PHENERGAN) 25 MG tablet Take 1 tablet (25 mg total) by mouth every 6 (six) hours as needed for nausea. 08/09/18   Leandrew Koyanagi, MD  senna-docusate (SENOKOT S) 8.6-50 MG tablet Take 1-2 tablets by mouth at bedtime as needed. 08/09/18   Leandrew Koyanagi, MD    Allergies Flexeril [cyclobenzaprine] and Seroquel [quetiapine fumarate]  No family history on file.  Social History Social History   Tobacco Use  . Smoking status: Current Every Day Smoker    Packs/day: 1.00    Years: 30.00    Pack years: 30.00    Types: Cigarettes  . Smokeless tobacco: Never Used  . Tobacco comment: since age 30  Substance Use Topics  . Alcohol use: No  . Drug use:  Not Currently    Frequency: 7.0 times per week    Types: Cocaine    Comment: uses heroin for pain 08/07/18. Last cocaine 08/02/18    Review of Systems Constitutional: No fever/chills Eyes: No visual changes. ENT: No trauma. Cardiovascular: Denies chest pain. Respiratory: Denies shortness of breath. Gastrointestinal: No abdominal pain.  No nausea, no vomiting.  Musculoskeletal: Positive for left ankle pain, positive right knee pain and right hip pain. Skin: Positive for abrasion right knee. Neurological: Negative for headaches, focal weakness or numbness. ____________________________________________   PHYSICAL EXAM:  VITAL SIGNS: ED Triage Vitals [08/24/18 0228]  Enc Vitals Group     BP (!) 143/82     Pulse Rate (!) 104     Resp 18     Temp 98.2 F (36.8 C)     Temp src      SpO2 98 %     Weight 200 lb (90.7 kg)     Height 5\' 2"  (1.575 m)     Head Circumference      Peak Flow      Pain Score 9     Pain Loc      Pain Edu?      Excl. in [place]?    Constitutional: Alert and oriented. Well appearing and in no acute distress. Eyes: Conjunctivae are normal.  Head: Atraumatic. Nose: No trauma. Neck: No stridor.   Cardiovascular: Normal rate, regular rhythm. Grossly normal heart sounds.  Good peripheral circulation. Respiratory: Normal respiratory effort.  No retractions. Lungs CTAB. Gastrointestinal: Soft and nontender. No distention.  Musculoskeletal: No tenderness on palpation of the thoracic and lumbar spine.  There is some generalized tenderness over the right lateral hip area the patient is able to move slowly without any difficulty.  There is no rotation or shortening of the limb noted.  Patient is able to stand and bear weight without assistance.  On examination of the right knee there is a superficial abrasion over the patella without active bleeding.  There is moderate tenderness on palpation but range of motion is without restriction.  On examination of the left ankle there  is moderate soft tissue swelling noted on the lateral aspect and anterior lateral malleolus.  No ecchymosis or abrasions were noted.  Patient is able to flex and extend her foot without any difficulty.  Skin is intact. Neurologic:  Normal speech and language. No gross focal neurologic deficits are appreciated. No gait instability. Skin:  Skin is warm, dry.  Abrasion as noted above. Psychiatric: Mood and affect are normal. Speech and behavior are normal.  ____________________________________________   LABS (all labs ordered are listed, but only abnormal results are displayed)  Labs Reviewed - No data to display RADIOLOGY  Official  radiology report(s): Dg Ankle Complete Left  Result Date: 08/24/2018 CLINICAL DATA:  Fall, left ankle pain EXAM: LEFT ANKLE COMPLETE - 3+ VIEW COMPARISON:  05/31/2013 FINDINGS: No fracture or dislocation is seen. The ankle mortise is intact. The base of the fifth metatarsal is unremarkable. Small plantar calcaneal enthesophyte. Mild lateral soft tissue swelling. IMPRESSION: No fracture or dislocation is seen. Mild lateral soft tissue swelling. Electronically Signed   By: Charline BillsSriyesh  Krishnan M.D.   On: 08/24/2018 03:11   Dg Knee Complete 4 Views Right  Result Date: 08/24/2018 CLINICAL DATA:  Fall, right knee pain EXAM: RIGHT KNEE - COMPLETE 4+ VIEW COMPARISON:  None. FINDINGS: No fracture or dislocation is seen. The joint spaces are preserved. The visualized soft tissues are unremarkable. Small suprapatellar knee joint effusion. IMPRESSION: No fracture or dislocation is seen. Small suprapatellar knee joint effusion. Electronically Signed   By: Charline BillsSriyesh  Krishnan M.D.   On: 08/24/2018 03:10   Dg Hip Unilat W Or Wo Pelvis 2-3 Views Right  Result Date: 08/24/2018 CLINICAL DATA:  Fall, right hip pain EXAM: DG HIP (WITH OR WITHOUT PELVIS) 2-3V RIGHT COMPARISON:  None. FINDINGS: No fracture or dislocation is seen. Mild to moderate degenerative changes of the right hip. Left  hip joint space is preserved. Visualized bony pelvis appears intact. IMPRESSION: No fracture or dislocation is seen. Mild to moderate degenerative changes of the right hip. Electronically Signed   By: Charline BillsSriyesh  Krishnan M.D.   On: 08/24/2018 03:11    ____________________________________________   PROCEDURES  Procedure(s) performed (including Critical Care):  Procedures  Jones wrap was applied to the right knee after the abrasion was cleaned.  Jones wrap was applied to the left ankle by RN. ____________________________________________   INITIAL IMPRESSION / ASSESSMENT AND PLAN / ED COURSE  As part of my medical decision making, I reviewed the following data within the electronic MEDICAL RECORD NUMBER Notes from prior ED visits and [place] Controlled Substance Database  48 year old female with presents to ED after she fell at home which began when she twisted her left ankle.  Patient denies this being a syncopal episode.  She has abrasions to her right knee, pain to her left ankle and also pain to her right hip.  Patient is scheduled for a total hip replacement on 8/10 and is worried that she is done damage to this which would postpone her surgery.  X-rays were reassuring patient was made aware there was no acute bony injury.  Jones wrap was placed to the right knee after abrasions were cleaned.  Jones wrap was placed to the left ankle for added support.  Patient is encouraged to ice and elevate both extremities as needed for pain and also swelling.  She will continue with her regular medication as prescribed by her doctor.  She was also given Percocet while in the ED and discharged with a prescription for the same.  She is to follow-up with her orthopedist if any continued problems. ____________________________________________   FINAL CLINICAL IMPRESSION(S) / ED DIAGNOSES  Final diagnoses:  Contusion of right hip, initial encounter  Sprain of left ankle, unspecified ligament, initial encounter   Contusion of right knee, initial encounter  Abrasion, knee, right, initial encounter  Fall in home, initial encounter     ED Discharge Orders         Ordered    oxyCODONE-acetaminophen (PERCOCET) 5-325 MG tablet  Every 6 hours PRN     08/24/18 0733  Note:  This document was prepared using Dragon voice recognition software and may include unintentional dictation errors.    Tommi RumpsSummers, Zondra Lawlor L, PA-C 08/24/18 1525    Minna AntisPaduchowski, Kevin, MD 08/25/18 1259

## 2018-08-25 ENCOUNTER — Telehealth: Payer: Self-pay

## 2018-08-25 NOTE — Telephone Encounter (Signed)
Left voicemail for patient at 925am to call [facility] to reschedule missed appt on 08/24/2018.

## 2018-09-02 ENCOUNTER — Other Ambulatory Visit (HOSPITAL_COMMUNITY)
Admission: RE | Admit: 2018-09-02 | Discharge: 2018-09-02 | Disposition: A | Discharge status: Home / Self Care | Payer: HRSA Program | Source: Ambulatory Visit | Attending: Orthopaedic Surgery | Admitting: Orthopaedic Surgery

## 2018-09-02 DIAGNOSIS — Z20828 Contact with and (suspected) exposure to other viral communicable diseases: Secondary | ICD-10-CM | POA: Diagnosis not present

## 2018-09-02 DIAGNOSIS — Z01812 Encounter for preprocedural laboratory examination: Secondary | ICD-10-CM | POA: Diagnosis not present

## 2018-09-02 LAB — SARS CORONAVIRUS 2 (TAT 6-24 HRS): SARS Coronavirus 2: NEGATIVE

## 2018-09-03 ENCOUNTER — Other Ambulatory Visit: Payer: Self-pay

## 2018-09-03 ENCOUNTER — Encounter (HOSPITAL_COMMUNITY): Payer: Self-pay | Admitting: Anesthesiology

## 2018-09-03 ENCOUNTER — Encounter (HOSPITAL_COMMUNITY)
Admission: RE | Admit: 2018-09-03 | Discharge: 2018-09-03 | Disposition: A | Discharge status: Home / Self Care | Payer: Medicaid Other | Source: Ambulatory Visit | Attending: Orthopaedic Surgery | Admitting: Orthopaedic Surgery

## 2018-09-03 ENCOUNTER — Ambulatory Visit (HOSPITAL_COMMUNITY)
Admission: RE | Admit: 2018-09-03 | Discharge: 2018-09-03 | Disposition: A | Discharge status: Home / Self Care | Payer: Medicaid Other | Source: Ambulatory Visit | Attending: Physician Assistant | Admitting: Physician Assistant

## 2018-09-03 ENCOUNTER — Encounter (HOSPITAL_COMMUNITY): Payer: Self-pay

## 2018-09-03 ENCOUNTER — Ambulatory Visit: Payer: Medicaid Other | Admitting: Pharmacy Technician

## 2018-09-03 DIAGNOSIS — Z79899 Other long term (current) drug therapy: Secondary | ICD-10-CM

## 2018-09-03 DIAGNOSIS — M87051 Idiopathic aseptic necrosis of right femur: Secondary | ICD-10-CM | POA: Insufficient documentation

## 2018-09-03 HISTORY — DX: Headache, unspecified: R51.9

## 2018-09-03 HISTORY — DX: Unspecified hearing loss, unspecified ear: H91.90

## 2018-09-03 HISTORY — DX: Dependence on other enabling machines and devices: Z99.89

## 2018-09-03 HISTORY — DX: Essential (primary) hypertension: I10

## 2018-09-03 HISTORY — DX: Dyspnea, unspecified: R06.00

## 2018-09-03 LAB — TYPE AND SCREEN
ABO/RH(D): A POS
Antibody Screen: NEGATIVE

## 2018-09-03 LAB — COMPREHENSIVE METABOLIC PANEL
ALT: 89 U/L — ABNORMAL HIGH (ref 0–44)
AST: 76 U/L — ABNORMAL HIGH (ref 15–41)
Albumin: 3.2 g/dL — ABNORMAL LOW (ref 3.5–5.0)
Alkaline Phosphatase: 119 U/L (ref 38–126)
Anion gap: 9 (ref 5–15)
BUN: 12 mg/dL (ref 6–20)
CO2: 22 mmol/L (ref 22–32)
Calcium: 8.7 mg/dL — ABNORMAL LOW (ref 8.9–10.3)
Chloride: 107 mmol/L (ref 98–111)
Creatinine, Ser: 0.79 mg/dL (ref 0.44–1.00)
GFR calc Af Amer: >60 mL/min (ref >60)
GFR calc non Af Amer: >60 mL/min (ref >60)
Glucose, Bld: 82 mg/dL (ref 70–99)
Potassium: 4.2 mmol/L (ref 3.5–5.1)
Sodium: 138 mmol/L (ref 135–145)
Total Bilirubin: <0.1 mg/dL — ABNORMAL LOW (ref 0.3–1.2)
Total Protein: 6.5 g/dL (ref 6.5–8.1)

## 2018-09-03 LAB — CBC WITH DIFFERENTIAL/PLATELET
Abs Immature Granulocytes: 0 10*3/uL (ref 0.00–0.07)
Basophils Absolute: 0 10*3/uL (ref 0.0–0.1)
Basophils Relative: 0 %
Eosinophils Absolute: 0 10*3/uL (ref 0.0–0.5)
Eosinophils Relative: 0 %
HCT: 42.9 % (ref 36.0–46.0)
Hemoglobin: 12.7 g/dL (ref 12.0–15.0)
Lymphocytes Relative: 24 %
Lymphs Abs: 2.3 10*3/uL (ref 0.7–4.0)
MCH: 21.9 pg — ABNORMAL LOW (ref 26.0–34.0)
MCHC: 29.6 g/dL — ABNORMAL LOW (ref 30.0–36.0)
MCV: 74 fL — ABNORMAL LOW (ref 80.0–100.0)
Monocytes Absolute: 0.7 10*3/uL (ref 0.1–1.0)
Monocytes Relative: 7 %
Neutro Abs: 6.7 10*3/uL (ref 1.7–7.7)
Neutrophils Relative %: 69 %
Platelets: 314 10*3/uL (ref 150–400)
RBC: 5.8 MIL/uL — ABNORMAL HIGH (ref 3.87–5.11)
RDW: 22.4 % — ABNORMAL HIGH (ref 11.5–15.5)
WBC: 9.7 10*3/uL (ref 4.0–10.5)
nRBC: 0 % (ref 0.0–0.2)

## 2018-09-03 LAB — SURGICAL PCR SCREEN
MRSA, PCR: NEGATIVE
Staphylococcus aureus: NEGATIVE

## 2018-09-03 LAB — APTT: aPTT: 31 seconds (ref 24–36)

## 2018-09-03 LAB — PROTIME-INR
INR: 1 (ref 0.8–1.2)
Prothrombin Time: 13.2 seconds (ref 11.4–15.2)

## 2018-09-03 IMAGING — CR CHEST - 2 VIEW
2 series · 2 of 2 positions shown · non-contrast
Comparison: [DATE]

CLINICAL DATA: Preoperative study prior to total hip replacement.

EXAM:
CHEST - 2 VIEW

[w chest pa]
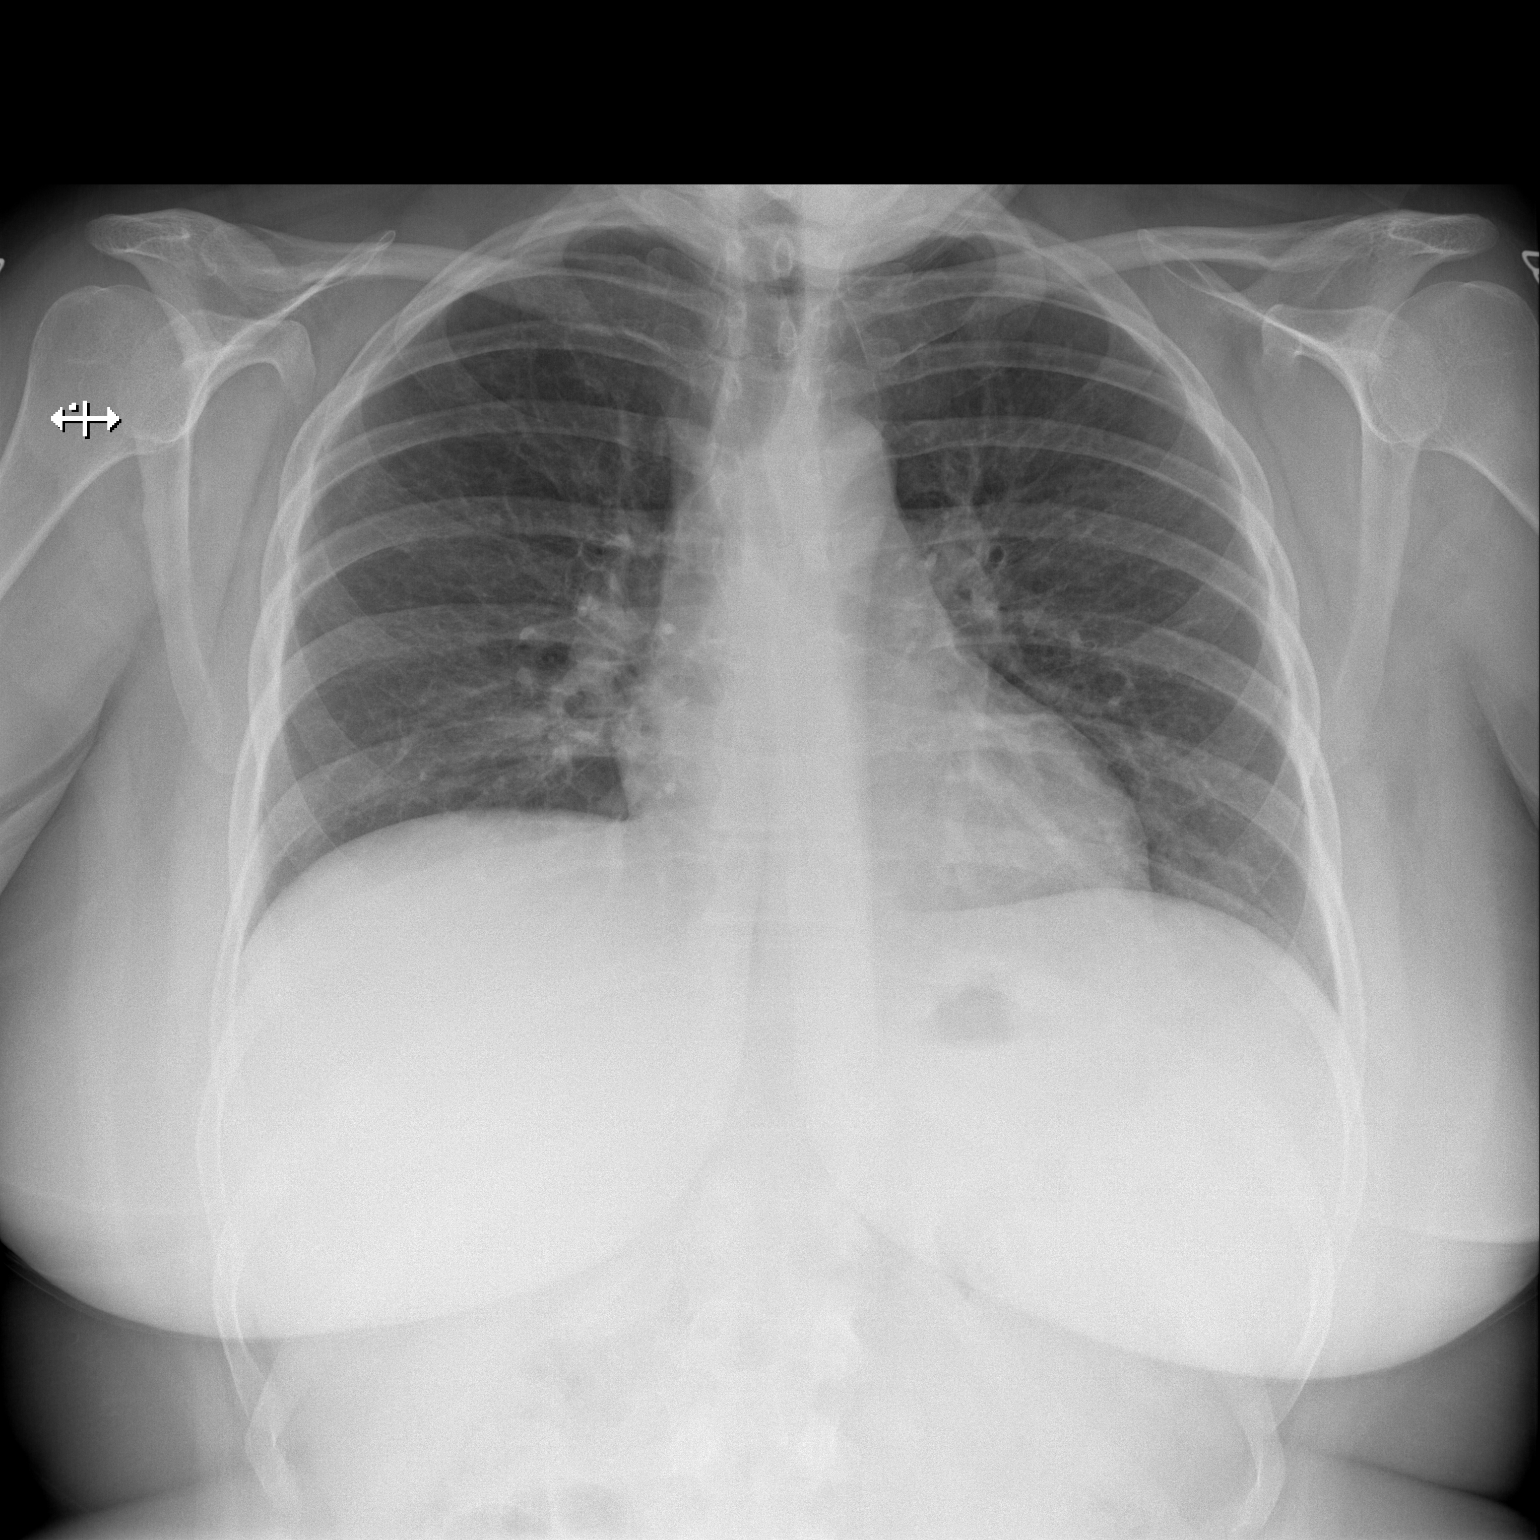

[w chest lat]
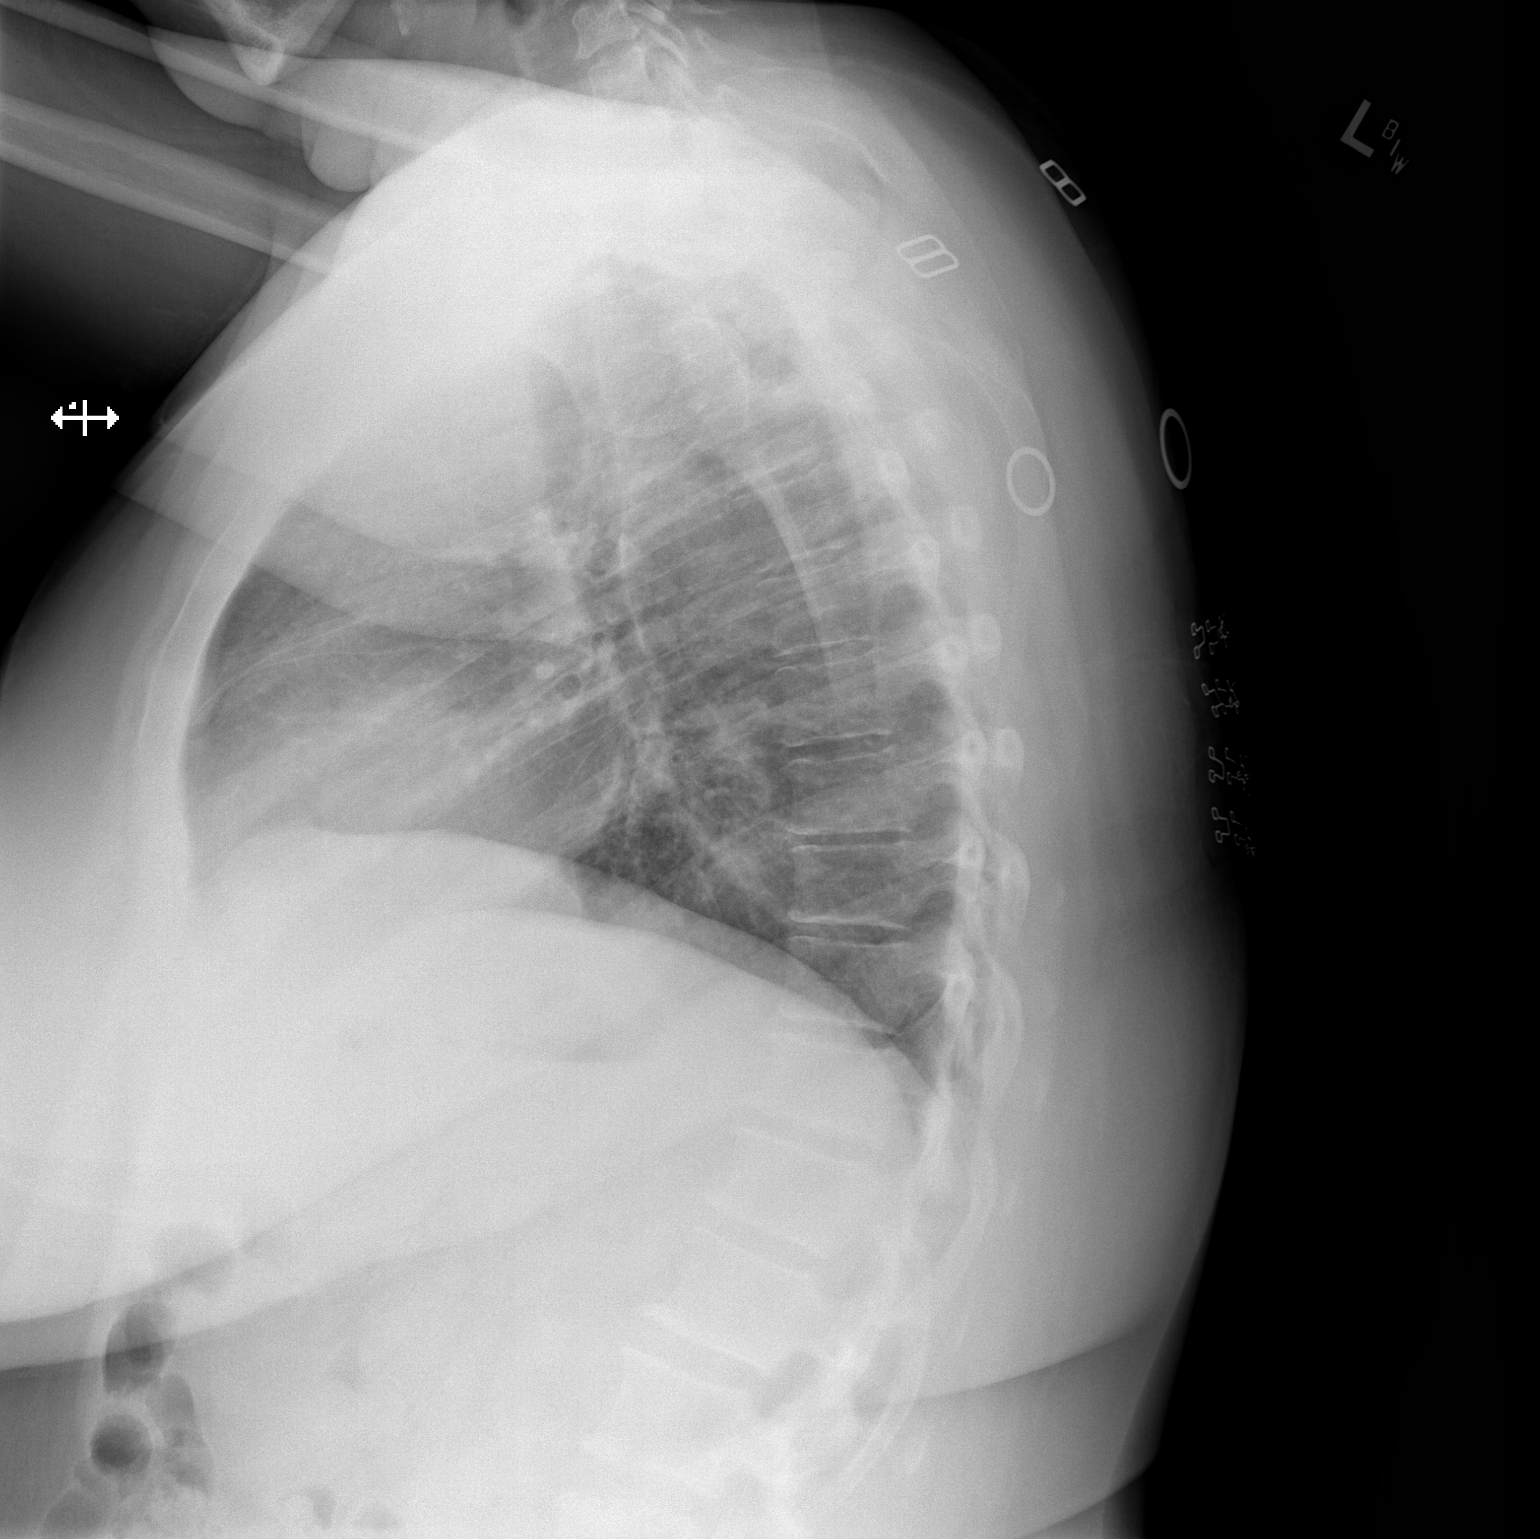

[2 of 2 positions shown; findings below may reference images not displayed]

FINDINGS: The heart size and mediastinal contours are within normal limits.
Both lungs are clear. The visualized skeletal structures are
unremarkable.
IMPRESSION: No active cardiopulmonary disease.

## 2018-09-03 MED ORDER — TRANEXAMIC ACID-NACL 1000-0.7 MG/100ML-% IV SOLN: 1000.0000 mg | INTRAVENOUS | Status: DC

## 2018-09-03 MED ORDER — CEFAZOLIN SODIUM-DEXTROSE 2-4 GM/100ML-% IV SOLN: 2.0000 g | INTRAVENOUS | Status: DC

## 2018-09-03 MED ORDER — TRANEXAMIC ACID 1000 MG/10ML IV SOLN
2000.0000 mg | INTRAVENOUS | Status: DC
  Filled 2018-09-03: qty 20

## 2018-09-03 NOTE — Progress Notes (Signed)
[facility] [address] - [place]), [place] - [address] [place] [address] Phone: [phone] Fax: [phone]  [facility] [place], [place] - [address] [place] [address] Phone: [phone] Fax: [phone]  [id number] [place], [place] - [address] AT [facility] [address] [place] [address] Phone: [phone] Fax: [phone]     Your procedure is scheduled on Monday, August 10.  Report to [facility] Main Entrance "A" at 11 A.M., and check in at the Admitting office.  Call this number if you have problems the morning of surgery:  [phone]  Call [phone] if you have any questions prior to your surgery date Monday-Friday 8am-4pm    Remember:  Do not eat after midnight the night before your surgery Wynelle Link(Sun)  You may drink clear liquids until 8 am Monday, the morning of your surgery.   Clear liquids allowed are: Water, Non-Citrus Juices (without pulp), Carbonated Beverages, Clear Tea, Black Coffee Only, and Gatorade   Please complete your PRE-SURGERY ENSURE that was provided to you by 11 am the morning of surgery.  Please, if able, drink it in one setting. DO NOT SIP.   Take these medicines the morning of surgery with A SIP OF WATER: Albuterol Inhaler - Bring with you on day of surgery diltiazem (CARDIZEM CD)  Glecaprevir-Pibrentasvir (MAVYRET)  oxyCODONE-acetaminophen (PERCOCET) if needed   7 days prior to surgery STOP taking any Aspirin (unless otherwise instructed by your surgeon), Aleve, Naproxen, Ibuprofen, Motrin, Advil, Goody's, BC's, all herbal medications, fish oil, and all vitamins.    The Morning of Surgery  Do not wear jewelry, make-up or nail polish.  Do not wear lotions, powders, or perfumes/colognes, or deodorant  Do not shave 48 hours prior to surgery.  Men may shave face and  neck.  Do not bring valuables to the hospital.  [facility] is not responsible for any belongings or valuables.  If you are a smoker, DO NOT Smoke 24 hours prior to surgery IF you wear a CPAP at night please bring your mask, tubing, and machine the morning of surgery   Remember that you must have someone to transport you home after your surgery, and remain with you for 24 hours if you are discharged the same day.   Contacts, glasses, hearing aids, dentures or bridgework may not be worn into surgery.   For patients admitted to the hospital, discharge time will be determined by your treatment team.  Patients discharged the day of surgery will not be allowed to drive home.    Special instructions:   [place]- Preparing For Surgery  Before surgery, you can play an important role. Because skin is not sterile, your skin needs to be as free of germs as possible. You can reduce the number of germs on your skin by washing with CHG (chlorahexidine gluconate) Soap before surgery.  CHG is an antiseptic cleaner which kills germs and bonds with the skin to continue killing germs even after washing.    Oral Hygiene is also important to reduce your risk of infection.  Remember - BRUSH YOUR TEETH THE MORNING OF SURGERY WITH YOUR REGULAR TOOTHPASTE  Please do not use if you have an allergy to CHG or antibacterial soaps. If your skin becomes reddened/irritated stop using the CHG.  Do not shave (including legs and underarms) for at least 48 hours  prior to first CHG shower. It is OK to shave your face.  Please follow these instructions carefully.   1. Shower the NIGHT BEFORE SURGERY Sun and the [facility] OF SURGERY Mon with CHG Soap.   2. If you chose to wash your hair, wash your hair first as usual with your normal shampoo.  3. After you shampoo, rinse your hair and body thoroughly to remove the shampoo.  4. Use CHG as you would any other liquid soap. You can apply CHG directly to the skin and wash  gently with a scrungie or a clean washcloth.   5. Apply the CHG Soap to your body ONLY FROM THE NECK DOWN.  Do not use on open wounds or open sores. Avoid contact with your eyes, ears, mouth and genitals (private parts). Wash Face and genitals (private parts)  with your normal soap.   6. Wash thoroughly, paying special attention to the area where your surgery will be performed.  7. Thoroughly rinse your body with warm water from the neck down.  8. DO NOT shower/wash with your normal soap after using and rinsing off the CHG Soap.  9. Pat yourself dry with a CLEAN TOWEL.  10. Wear CLEAN PAJAMAS to bed the night before surgery, wear comfortable clothes the morning of surgery  11. Place CLEAN SHEETS on your bed the night of your first shower and DO NOT SLEEP WITH PETS.    Day of Surgery:  Do not apply any deodorants/lotions. Please shower the morning of surgery with the CHG soap  Please wear clean clothes to the hospital/surgery center.   Remember to brush your teeth WITH YOUR REGULAR TOOTHPASTE.   Please read over the following fact sheets that you were given.

## 2018-09-03 NOTE — Progress Notes (Signed)
Patient needs to provide [facility] Disability Award letter for Everlene Balls before additional medication assistance can be provided by [facility].  Made patient aware.  [facility]

## 2018-09-03 NOTE — Progress Notes (Addendum)
Patient denies shortness of breath, fever, cough and chest pain at PAT appointment  PCP - Antony Odea, NP [facility] Cardiologist - Denies  Chest x-ray - 09/03/18 EKG - 7/13//20 Stress Test - Denies ECHO - Denies Cardiac Cath - Denies  Aspirin Instructions: Patient is not taking aspirin at this time.  ERAS: Clears til 8 am, Ensure Pre-Surgery drink given.  Anesthesia review: Yes, spoke with Dr Suzette Battiest via phone, reviewed elevated labs Ast/Alt, pt hx chronic hep c, currently using heroin.  Rapid drug screen and urine preg on DOS.  Jeneen Rinks, [place] reviewed chart back in 08/06/18 when this surgery was scheduled but cancelled due to positive cocaine.  STOP now taking any Aspirin (unless otherwise instructed by your surgeon), Aleve, Naproxen, Ibuprofen, Motrin, Advil, Goody's, BC's, all herbal medications, fish oil, and all vitamins.   Coronavirus Screening Have you or your son experienced the following symptoms:  Cough yes/no: No Fever (>100.95F)  yes/no: No Runny nose yes/no: No Sore throat yes/no: No Difficulty breathing/shortness of breath  yes/no: No  Have you or your son traveled in the last 14 days and where? yes/no: No

## 2018-09-06 ENCOUNTER — Ambulatory Visit (HOSPITAL_COMMUNITY)
Admission: RE | Admit: 2018-09-06 | Discharge: 2018-09-06 | Disposition: A | Discharge status: Home / Self Care | Payer: Self-pay | Attending: Orthopaedic Surgery | Admitting: Orthopaedic Surgery

## 2018-09-06 ENCOUNTER — Encounter (HOSPITAL_COMMUNITY)
Admission: RE | Disposition: A | Discharge status: Home / Self Care | Payer: Self-pay | Source: Home / Self Care | Attending: Orthopaedic Surgery

## 2018-09-06 DIAGNOSIS — B182 Chronic viral hepatitis C: Secondary | ICD-10-CM | POA: Insufficient documentation

## 2018-09-06 DIAGNOSIS — Z79899 Other long term (current) drug therapy: Secondary | ICD-10-CM | POA: Insufficient documentation

## 2018-09-06 DIAGNOSIS — Z5309 Procedure and treatment not carried out because of other contraindication: Secondary | ICD-10-CM | POA: Insufficient documentation

## 2018-09-06 DIAGNOSIS — I10 Essential (primary) hypertension: Secondary | ICD-10-CM | POA: Insufficient documentation

## 2018-09-06 DIAGNOSIS — F319 Bipolar disorder, unspecified: Secondary | ICD-10-CM | POA: Insufficient documentation

## 2018-09-06 DIAGNOSIS — F17210 Nicotine dependence, cigarettes, uncomplicated: Secondary | ICD-10-CM | POA: Insufficient documentation

## 2018-09-06 DIAGNOSIS — J45909 Unspecified asthma, uncomplicated: Secondary | ICD-10-CM | POA: Insufficient documentation

## 2018-09-06 DIAGNOSIS — M1611 Unilateral primary osteoarthritis, right hip: Secondary | ICD-10-CM | POA: Diagnosis present

## 2018-09-06 LAB — POCT PREGNANCY, URINE: Preg Test, Ur: NEGATIVE

## 2018-09-06 LAB — RAPID URINE DRUG SCREEN, HOSP PERFORMED
Amphetamines: NOT DETECTED
Barbiturates: NOT DETECTED
Benzodiazepines: NOT DETECTED
Cocaine: NOT DETECTED
Opiates: NOT DETECTED
Tetrahydrocannabinol: POSITIVE — AB

## 2018-09-06 SURGERY — ARTHROPLASTY, HIP, TOTAL, ANTERIOR APPROACH
Anesthesia: Spinal | Laterality: Right

## 2018-09-06 MED ORDER — PROPOFOL 10 MG/ML IV BOLUS
INTRAVENOUS | Status: AC
  Filled 2018-09-06: qty 20

## 2018-09-06 MED ORDER — OXYCODONE-ACETAMINOPHEN 5-325 MG PO TABS: 1.0000 | ORAL_TABLET | Freq: Three times a day (TID) | ORAL | 0 refills | Status: DC | PRN

## 2018-09-06 MED ORDER — MIDAZOLAM HCL 2 MG/2ML IJ SOLN
INTRAMUSCULAR | Status: AC
  Filled 2018-09-06: qty 2

## 2018-09-06 MED ORDER — VANCOMYCIN HCL 1000 MG IV SOLR
INTRAVENOUS | Status: AC
  Filled 2018-09-06: qty 1000

## 2018-09-06 MED ORDER — FENTANYL CITRATE (PF) 250 MCG/5ML IJ SOLN
INTRAMUSCULAR | Status: AC
  Filled 2018-09-06: qty 5

## 2018-09-06 NOTE — Progress Notes (Signed)
Surgery has been cancelled by Dr. Sabra Heck for recent heroin use.  I have discussed with the patient that she needs to abstain from all illicit drug use for at least a week until she can safely have anesthesia and hip surgery.  I have given her enough percocets to last her for the next week so there should be no reason why she needs to use heroin for the pain.  I have told her that if she uses any illicit drugs during this next week, her surgery will be postponed indefinitely until she can go to drug rehab.  All parties in agreement.

## 2018-09-06 NOTE — Progress Notes (Signed)
Patient stated that she last used heroin yesterday.  Dr. Erlinda Hong aware and Dr. Sabra Heck aware.  Dr. Sabra Heck stated that case is cancelled due to patient admitting that she used heroin yesterday.

## 2018-09-06 NOTE — Discharge Instructions (Signed)

## 2018-09-06 NOTE — H&P (Signed)
PREOPERATIVE H&P  Chief Complaint: right hip degenerative joint disease  HPI: Brenda [name] is a 48 y.o. female who presents for surgical treatment of right hip degenerative joint disease.  She denies any changes in medical history.  Past Medical History:  Diagnosis Date  . Ankle fracture, left    in past.  . Anxiety   . Arthritis    Right hip, scheduled for replacement 7/13/39m, left ankle  . Asthma   . Bipolar disorder (HCC)   . Chronic hepatitis C (HCC)   . Depression   . Dyspnea    occasional with exertion  . GERD (gastroesophageal reflux disease)    [facility]  . Headache   . Hearing loss    some loss in both ears - no dx - per patient "runs in family", no hearing aids  . Hypertension   . Lump in female breast    right  . Pneumonia    x 1  . Polysubstance abuse (HCC)   . Primary localized osteoarthritis of hip    right  . SVD (spontaneous vaginal delivery)    x 3  . Uses roller walker    occasional  . Wears dentures    full upper   Past Surgical History:  Procedure Laterality Date  . TUBAL LIGATION     Social History   Socioeconomic History  . Marital status: Single    Spouse name: Not on file  . Number of children: Not on file  . Years of education: Not on file  . Highest education level: Not on file  Occupational History  . Not on file  Social Needs  . Financial resource strain: Not on file  . Food insecurity    Worry: Not on file    Inability: Not on file  . Transportation needs    Medical: Not on file    Non-medical: Not on file  Tobacco Use  . Smoking status: Current Every Day Smoker    Packs/day: 1.00    Years: 31.00    Pack years: 31.00    Types: Cigarettes  . Smokeless tobacco: Never Used  . Tobacco comment: since age 48  Substance and Sexual Activity  . Alcohol use: No  . 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 Last cocaine 08/02/18  . Sexual  activity: Yes    Birth control/protection: Surgical  Lifestyle  . Physical activity    Days per week: Not on file    Minutes per session: Not on file  . Stress: Not on file  Relationships  . Social Musicianconnections    Talks on phone: Not on file    Gets together: Not on file    Attends religious service: Not on file    Active member of club or organization: Not on file    Attends meetings of clubs or organizations: Not on file    Relationship status: Not on file  Other Topics Concern  . Not on file  Social History Narrative  . Not on file   No family history on file. Allergies  Allergen Reactions  . Flexeril [Cyclobenzaprine] Other (See Comments)    RLS-like symptoms   . Seroquel [Quetiapine Fumarate] Other (See Comments)    RLS-like symptoms   Prior to Admission medications   Medication Sig Start Date End Date Taking? Authorizing Provider  albuterol (PROVENTIL HFA;VENTOLIN HFA) 108 (90 Base)  MCG/ACT inhaler Inhale 2 puffs into the lungs every 4 (four) hours as needed for wheezing or shortness of breath. 04/30/18  Yes Arta Silence, MD  Aspirin-Caffeine (BAYER BACK & BODY) 500-32.5 MG TABS Take 1-2 tablets by mouth 3 (three) times daily as needed (pain.).   Yes [provider]  aspirin EC 81 MG tablet Take 1 tablet (81 mg total) by mouth 2 (two) times daily. Patient not taking: Reported on 09/01/2018 08/09/18   Leandrew Koyanagi, MD  diltiazem (CARDIZEM CD) 120 MG 24 hr capsule Take 1 capsule (120 mg total) by mouth daily. 07/14/18   Iloabachie, Chioma E, NP  ferrous sulfate 325 (65 FE) MG tablet Take 1 tablet (325 mg total) by mouth daily with breakfast. Patient not taking: Reported on 09/01/2018 07/14/18   Iloabachie, Chioma E, NP  Glecaprevir-Pibrentasvir (MAVYRET) 100-40 MG TABS Take 3 tablets by mouth daily. 08/10/18   Lucilla Lame, MD  ibuprofen (ADVIL) 800 MG tablet Take 1 tablet (800 mg total) by mouth every 8 (eight) hours as needed for moderate pain. Patient not taking:  Reported on 09/01/2018 07/14/18   Iloabachie, Chioma E, NP  methocarbamol (ROBAXIN) 750 MG tablet Take 1 tablet (750 mg total) by mouth 2 (two) times daily as needed for muscle spasms. Patient not taking: Reported on 09/01/2018 08/09/18   Leandrew Koyanagi, MD  oxyCODONE-acetaminophen (PERCOCET) 5-325 MG tablet Take 1 tablet by mouth every 6 (six) hours as needed for severe pain. Patient not taking: Reported on 09/01/2018 08/24/18 08/24/19  Johnn Hai, PA-C  promethazine (PHENERGAN) 25 MG tablet Take 1 tablet (25 mg total) by mouth every 6 (six) hours as needed for nausea. Patient not taking: Reported on 09/01/2018 08/09/18   Leandrew Koyanagi, MD  senna-docusate (SENOKOT S) 8.6-50 MG tablet Take 1-2 tablets by mouth at bedtime as needed. Patient not taking: Reported on 09/01/2018 08/09/18   Leandrew Koyanagi, MD     Positive ROS: All other systems have been reviewed and were otherwise negative with the exception of those mentioned in the HPI and as above.  Physical Exam: General: Alert, no acute distress Cardiovascular: No pedal edema Respiratory: No cyanosis, no use of accessory musculature GI: abdomen soft Skin: No lesions in the area of chief complaint Neurologic: Sensation intact distally Psychiatric: Patient is competent for consent with normal mood and affect Lymphatic: no lymphedema  MUSCULOSKELETAL: exam stable  Assessment: right hip degenerative joint disease  Plan: Plan for Procedure(s): RIGHT TOTAL HIP ARTHROPLASTY ANTERIOR APPROACH  The risks benefits and alternatives were discussed with the patient including but not limited to the risks of nonoperative treatment, versus surgical intervention including infection, bleeding, nerve injury,  blood clots, cardiopulmonary complications, morbidity, mortality, among others, and they were willing to proceed.   Preoperative templating of the joint replacement has been completed, documented, and submitted to the Operating Room personnel in order to  optimize intra-operative equipment management.  Eduard Roux, MD   09/06/2018 6:56 AM

## 2018-09-10 ENCOUNTER — Other Ambulatory Visit
Admission: RE | Admit: 2018-09-10 | Discharge: 2018-09-10 | Disposition: A | Discharge status: Home / Self Care | Payer: HRSA Program | Source: Ambulatory Visit | Attending: Orthopaedic Surgery | Admitting: Orthopaedic Surgery

## 2018-09-10 ENCOUNTER — Encounter (HOSPITAL_COMMUNITY): Payer: Self-pay | Admitting: *Deleted

## 2018-09-10 ENCOUNTER — Other Ambulatory Visit: Payer: Self-pay

## 2018-09-10 DIAGNOSIS — Z20828 Contact with and (suspected) exposure to other viral communicable diseases: Secondary | ICD-10-CM | POA: Insufficient documentation

## 2018-09-10 DIAGNOSIS — Z01812 Encounter for preprocedural laboratory examination: Secondary | ICD-10-CM | POA: Diagnosis not present

## 2018-09-10 NOTE — Progress Notes (Signed)
Denies changes since surgery was cancelled earlier this week. Patient aware of the need for a urine specimen on arrival. Patient states she has not use illicit substances since surgery was cancelled.

## 2018-09-11 LAB — SARS CORONAVIRUS 2 (TAT 6-24 HRS): SARS Coronavirus 2: NEGATIVE

## 2018-09-13 ENCOUNTER — Inpatient Hospital Stay (HOSPITAL_COMMUNITY): Payer: Self-pay

## 2018-09-13 ENCOUNTER — Inpatient Hospital Stay (HOSPITAL_COMMUNITY): Payer: Self-pay | Admitting: Certified Registered Nurse Anesthetist

## 2018-09-13 ENCOUNTER — Observation Stay (HOSPITAL_COMMUNITY)
Admission: RE | Admit: 2018-09-13 | Discharge: 2018-09-14 | Disposition: A | Discharge status: Home / Self Care | Payer: Self-pay | Attending: Orthopaedic Surgery | Admitting: Orthopaedic Surgery

## 2018-09-13 ENCOUNTER — Observation Stay (HOSPITAL_COMMUNITY): Payer: Self-pay

## 2018-09-13 ENCOUNTER — Encounter (HOSPITAL_COMMUNITY): Payer: Self-pay | Admitting: Certified Registered Nurse Anesthetist

## 2018-09-13 ENCOUNTER — Encounter (HOSPITAL_COMMUNITY)
Admission: RE | Disposition: A | Discharge status: Home / Self Care | Payer: Self-pay | Source: Home / Self Care | Attending: Orthopaedic Surgery

## 2018-09-13 ENCOUNTER — Other Ambulatory Visit: Payer: Self-pay

## 2018-09-13 DIAGNOSIS — Z96641 Presence of right artificial hip joint: Secondary | ICD-10-CM

## 2018-09-13 DIAGNOSIS — I10 Essential (primary) hypertension: Secondary | ICD-10-CM | POA: Insufficient documentation

## 2018-09-13 DIAGNOSIS — H9193 Unspecified hearing loss, bilateral: Secondary | ICD-10-CM | POA: Insufficient documentation

## 2018-09-13 DIAGNOSIS — F17210 Nicotine dependence, cigarettes, uncomplicated: Secondary | ICD-10-CM | POA: Insufficient documentation

## 2018-09-13 DIAGNOSIS — Z419 Encounter for procedure for purposes other than remedying health state, unspecified: Secondary | ICD-10-CM

## 2018-09-13 DIAGNOSIS — M1611 Unilateral primary osteoarthritis, right hip: Principal | ICD-10-CM | POA: Insufficient documentation

## 2018-09-13 DIAGNOSIS — Z79899 Other long term (current) drug therapy: Secondary | ICD-10-CM | POA: Insufficient documentation

## 2018-09-13 DIAGNOSIS — Z7982 Long term (current) use of aspirin: Secondary | ICD-10-CM | POA: Insufficient documentation

## 2018-09-13 DIAGNOSIS — Z96649 Presence of unspecified artificial hip joint: Secondary | ICD-10-CM

## 2018-09-13 HISTORY — PX: TOTAL HIP ARTHROPLASTY: SHX124

## 2018-09-13 LAB — RAPID URINE DRUG SCREEN, HOSP PERFORMED
Amphetamines: NOT DETECTED
Barbiturates: NOT DETECTED
Benzodiazepines: NOT DETECTED
Cocaine: NOT DETECTED
Opiates: POSITIVE — AB
Tetrahydrocannabinol: NOT DETECTED

## 2018-09-13 LAB — POCT PREGNANCY, URINE: Preg Test, Ur: NEGATIVE

## 2018-09-13 IMAGING — RF OPERATIVE RIGHT HIP WITH PELVIS
1 series · 6 of 6 positions shown · non-contrast
Comparison: None.

CLINICAL DATA: RIGHT total hip anterior approach.

EXAM:
OPERATIVE RIGHT HIP (WITH PELVIS IF PERFORMED) 6 VIEWS
TECHNIQUE: Fluoroscopic spot image(s) were submitted for interpretation
post-operatively.

[Series 1: unknown protocol · 0.20mm/px · 6 of 6 slices shown]
[im 1/6]
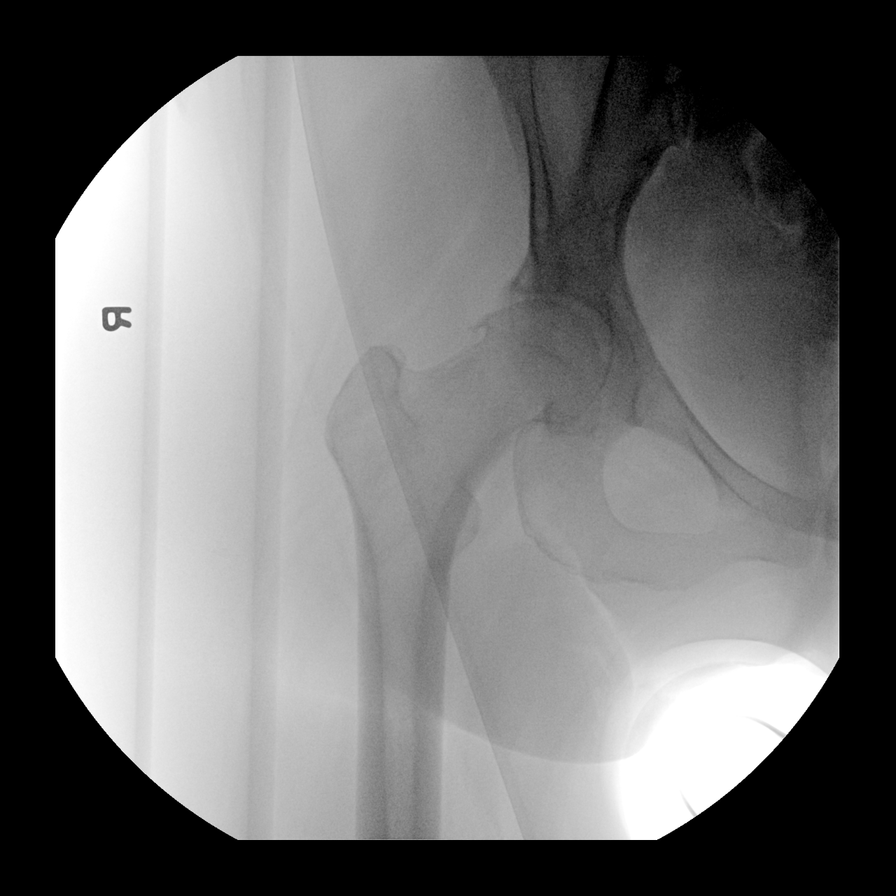
[im 2/6]
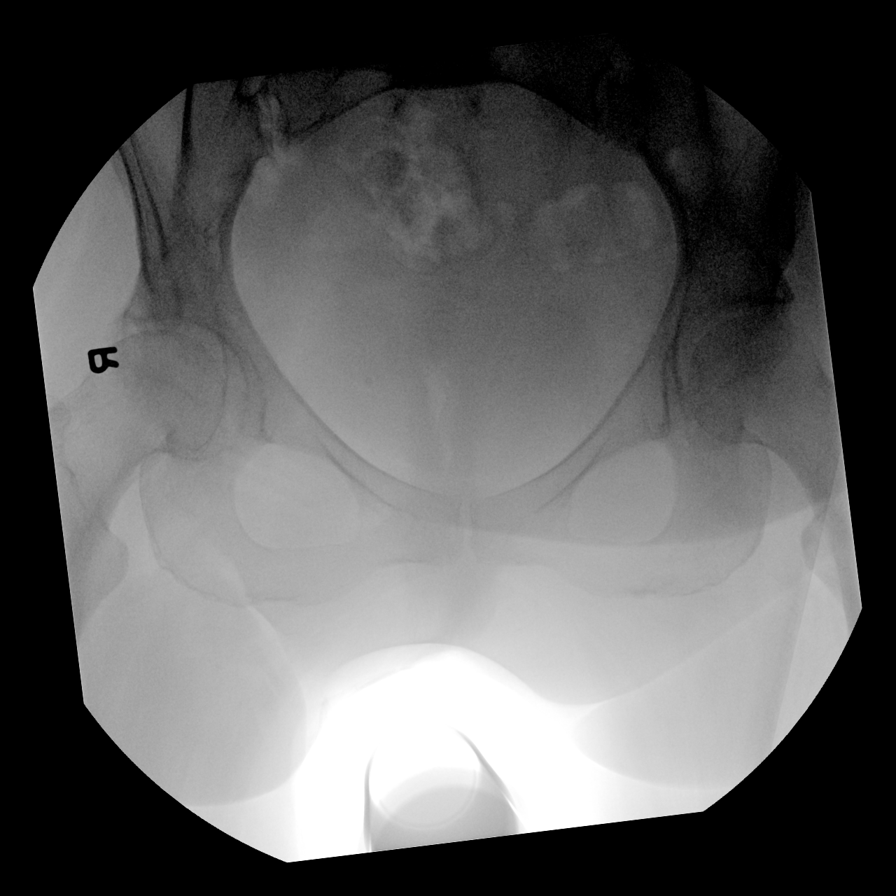
[im 3/6]
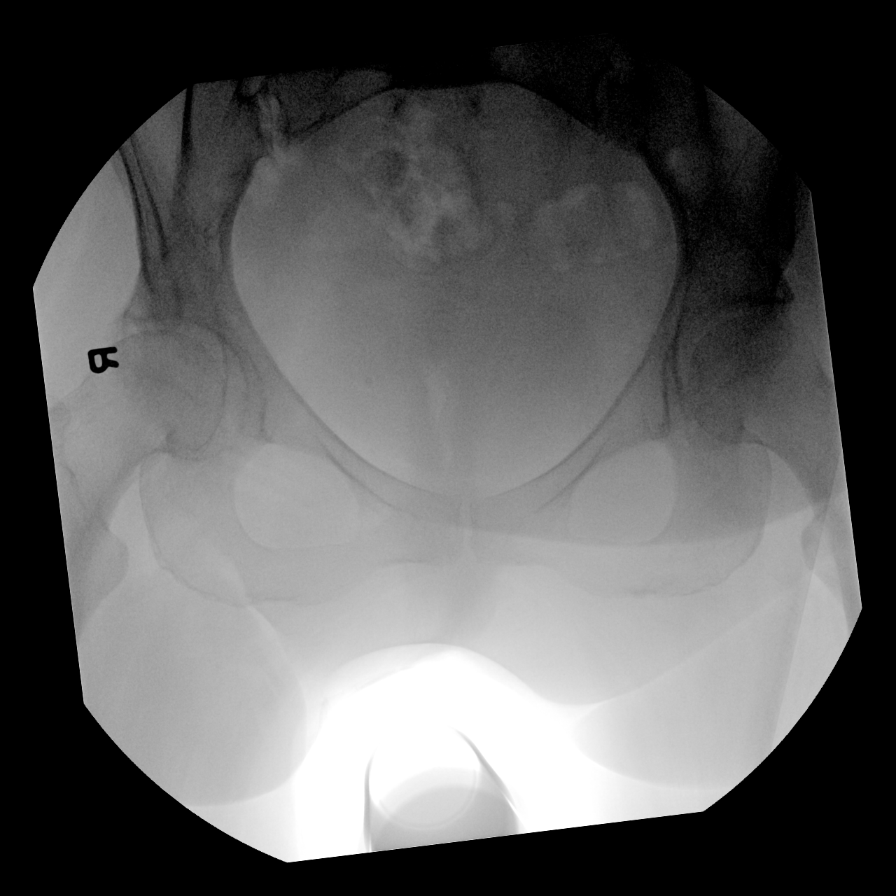
[im 4/6]
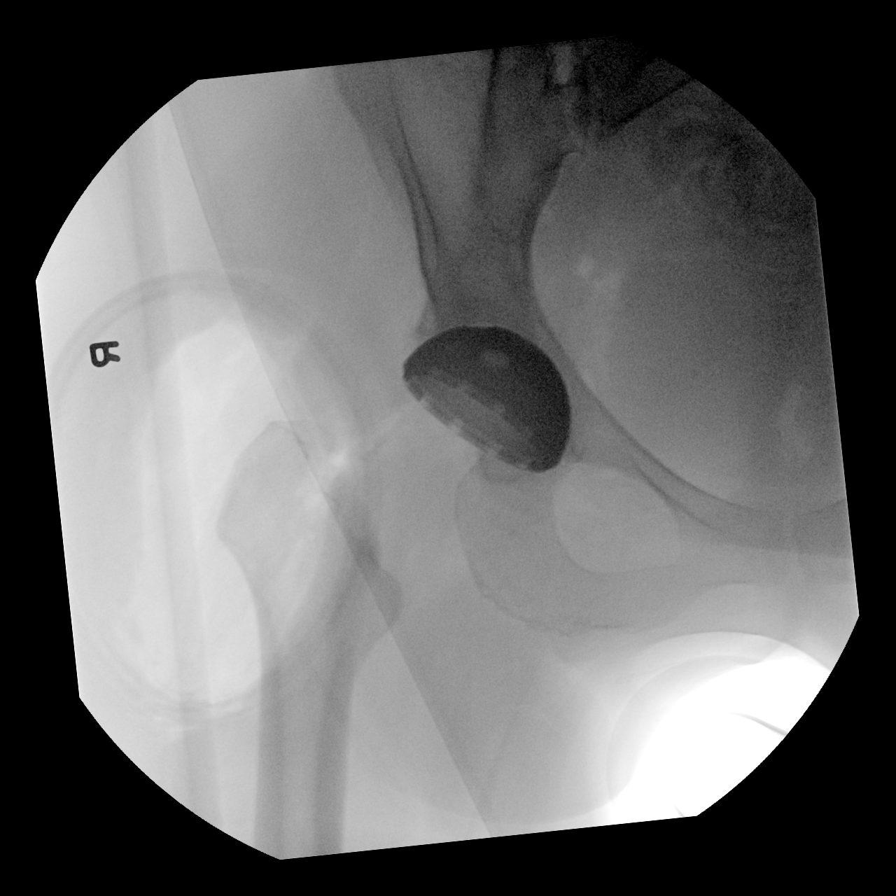
[im 5/6]
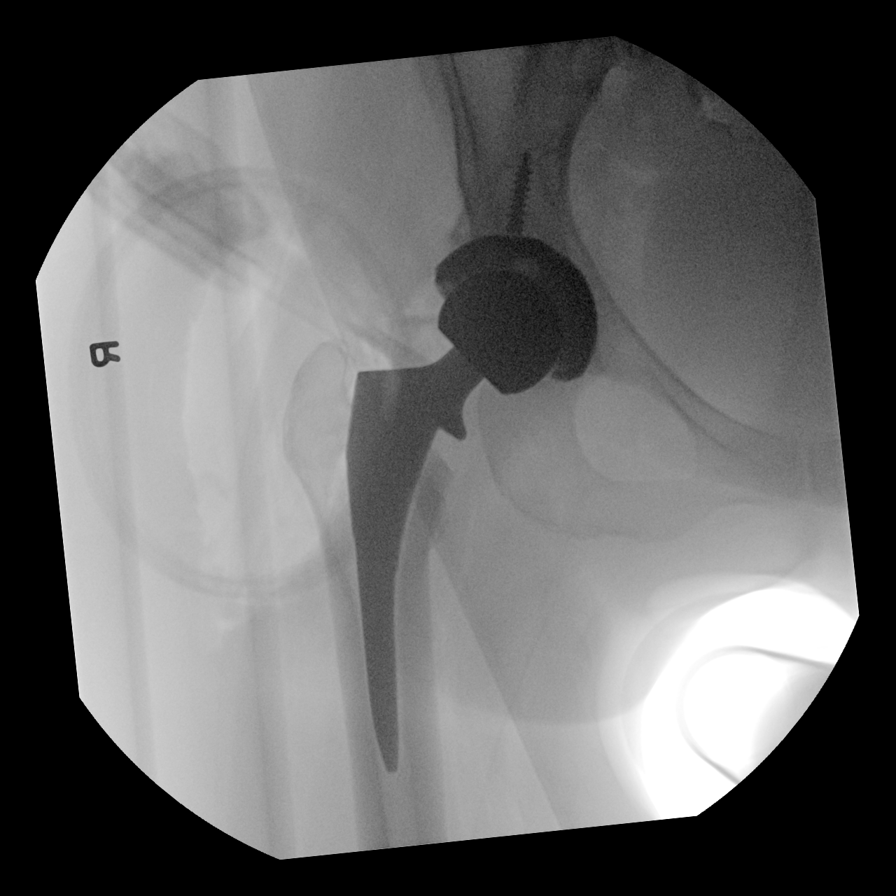
[im 6/6]
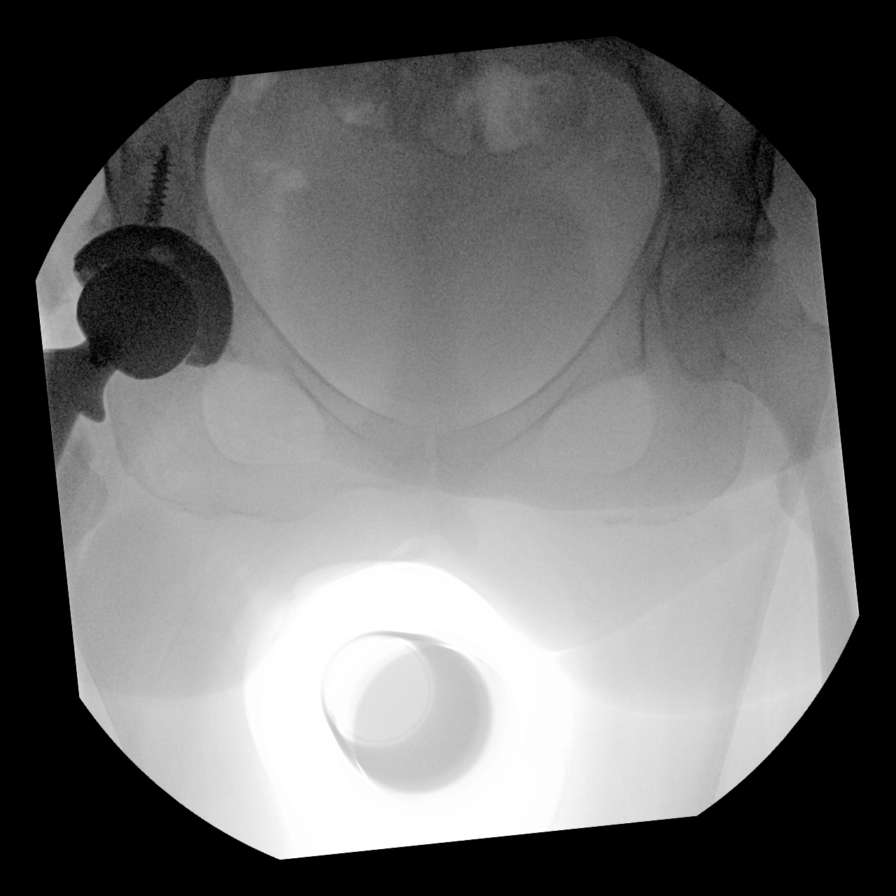

[6 of 6 positions shown; findings below may reference images not displayed]

FINDINGS: Six intraoperative fluoroscopic images are provided. Final images
show arthroplasty hardware at the RIGHT hip. Hardware appears
appropriately positioned. Osseous alignment appears anatomic.
Fluoroscopy provided for 23 seconds.
IMPRESSION: Intraoperative fluoroscopic images showing arthroplasty hardware at
the RIGHT hip. Hardware appears appropriately positioned. No
evidence of surgical complicating feature.

## 2018-09-13 IMAGING — DX PORTABLE PELVIS 1-2 VIEWS
1 series · 1 of 1 positions shown · non-contrast
Comparison: Intraoperative imaging today

CLINICAL DATA: Postop hip replacement

EXAM:
PORTABLE PELVIS 1-2 VIEWS

[pelvis ap]
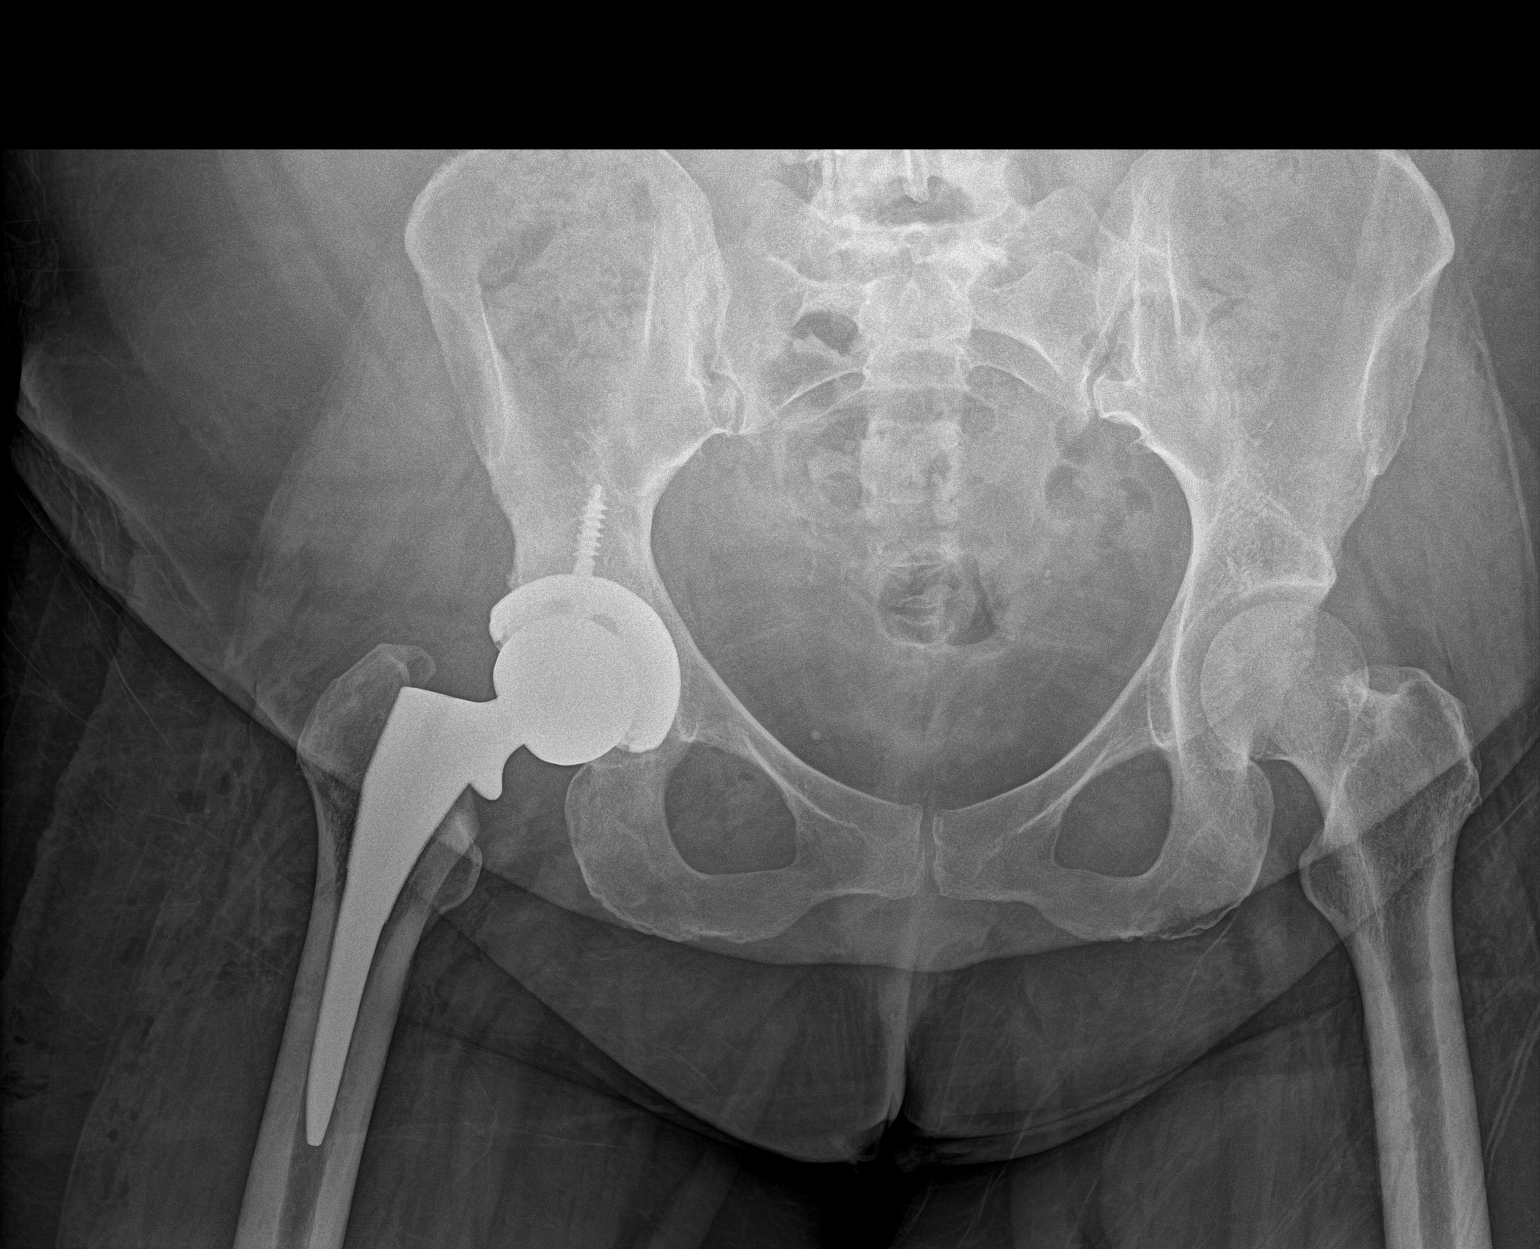

[1 of 1 positions shown; findings below may reference images not displayed]

FINDINGS: Changes of right hip replacement. Normal AP alignment. No hardware
bony complicating feature.
IMPRESSION: Right hip replacement.  No visible complicating feature.

## 2018-09-13 SURGERY — ARTHROPLASTY, HIP, TOTAL, ANTERIOR APPROACH
Anesthesia: Spinal | Laterality: Right

## 2018-09-13 MED ORDER — SODIUM CHLORIDE 0.9 % IR SOLN
Status: DC | PRN
  Administered 2018-09-13: 3000 mL

## 2018-09-13 MED ORDER — METHOCARBAMOL 750 MG PO TABS
750.0000 mg | ORAL_TABLET | Freq: Four times a day (QID) | ORAL | Status: DC | PRN
  Administered 2018-09-13 – 2018-09-14 (×4): 750 mg via ORAL
  Filled 2018-09-13 (×5): qty 1

## 2018-09-13 MED ORDER — TRANEXAMIC ACID-NACL 1000-0.7 MG/100ML-% IV SOLN
1000.0000 mg | Freq: Once | INTRAVENOUS | Status: AC
  Administered 2018-09-13: 1000 mg via INTRAVENOUS
  Filled 2018-09-13: qty 100

## 2018-09-13 MED ORDER — FENTANYL CITRATE (PF) 100 MCG/2ML IJ SOLN
25.0000 ug | INTRAMUSCULAR | Status: DC | PRN
  Administered 2018-09-13 (×2): 50 ug via INTRAVENOUS

## 2018-09-13 MED ORDER — METHOCARBAMOL 1000 MG/10ML IJ SOLN
500.0000 mg | Freq: Four times a day (QID) | INTRAVENOUS | Status: DC | PRN
  Filled 2018-09-13: qty 5

## 2018-09-13 MED ORDER — DEXAMETHASONE SODIUM PHOSPHATE 10 MG/ML IJ SOLN
10.0000 mg | Freq: Once | INTRAMUSCULAR | Status: AC
  Administered 2018-09-14: 10 mg via INTRAVENOUS
  Filled 2018-09-13: qty 1

## 2018-09-13 MED ORDER — ONDANSETRON HCL 4 MG/2ML IJ SOLN: 4.0000 mg | Freq: Four times a day (QID) | INTRAMUSCULAR | Status: DC | PRN

## 2018-09-13 MED ORDER — OXYCODONE HCL 5 MG PO TABS
10.0000 mg | ORAL_TABLET | ORAL | Status: DC | PRN
  Administered 2018-09-14 (×2): 10 mg via ORAL
  Filled 2018-09-13: qty 2

## 2018-09-13 MED ORDER — POLYETHYLENE GLYCOL 3350 17 G PO PACK: 17.0000 g | PACK | Freq: Every day | ORAL | Status: DC | PRN

## 2018-09-13 MED ORDER — PROPOFOL 500 MG/50ML IV EMUL
INTRAVENOUS | Status: DC | PRN
  Administered 2018-09-13: 100 ug/kg/min via INTRAVENOUS

## 2018-09-13 MED ORDER — MAGNESIUM CITRATE PO SOLN: 1.0000 | Freq: Once | ORAL | Status: DC | PRN

## 2018-09-13 MED ORDER — FENTANYL CITRATE (PF) 100 MCG/2ML IJ SOLN
INTRAMUSCULAR | Status: AC
  Administered 2018-09-13: 50 ug via INTRAVENOUS
  Filled 2018-09-13: qty 2

## 2018-09-13 MED ORDER — PROPOFOL 1000 MG/100ML IV EMUL
INTRAVENOUS | Status: AC
  Filled 2018-09-13: qty 100

## 2018-09-13 MED ORDER — OXYCODONE HCL 5 MG/5ML PO SOLN: 5.0000 mg | Freq: Once | ORAL | Status: AC | PRN

## 2018-09-13 MED ORDER — TRANEXAMIC ACID 1000 MG/10ML IV SOLN
2000.0000 mg | INTRAVENOUS | Status: AC
  Administered 2018-09-13: 2000 mg via TOPICAL
  Filled 2018-09-13: qty 20

## 2018-09-13 MED ORDER — HYDROMORPHONE HCL 1 MG/ML IJ SOLN
0.5000 mg | INTRAMUSCULAR | Status: DC | PRN
  Administered 2018-09-13: 1 mg via INTRAVENOUS

## 2018-09-13 MED ORDER — CEFAZOLIN SODIUM-DEXTROSE 2-4 GM/100ML-% IV SOLN
2.0000 g | Freq: Four times a day (QID) | INTRAVENOUS | Status: AC
  Administered 2018-09-13 – 2018-09-14 (×3): 2 g via INTRAVENOUS
  Filled 2018-09-13 (×3): qty 100

## 2018-09-13 MED ORDER — ACETAMINOPHEN 500 MG PO TABS
1000.0000 mg | ORAL_TABLET | Freq: Four times a day (QID) | ORAL | Status: AC
  Administered 2018-09-13 – 2018-09-14 (×4): 1000 mg via ORAL
  Filled 2018-09-13 (×4): qty 2

## 2018-09-13 MED ORDER — FENTANYL CITRATE (PF) 250 MCG/5ML IJ SOLN
INTRAMUSCULAR | Status: AC
  Filled 2018-09-13: qty 5

## 2018-09-13 MED ORDER — METHOCARBAMOL 500 MG PO TABS
ORAL_TABLET | ORAL | Status: AC
  Filled 2018-09-13: qty 1

## 2018-09-13 MED ORDER — DIPHENHYDRAMINE HCL 12.5 MG/5ML PO ELIX: 25.0000 mg | ORAL_SOLUTION | ORAL | Status: DC | PRN

## 2018-09-13 MED ORDER — DOCUSATE SODIUM 100 MG PO CAPS
100.0000 mg | ORAL_CAPSULE | Freq: Two times a day (BID) | ORAL | Status: DC
  Administered 2018-09-13 – 2018-09-14 (×2): 100 mg via ORAL
  Filled 2018-09-13 (×2): qty 1

## 2018-09-13 MED ORDER — MIDAZOLAM HCL 5 MG/5ML IJ SOLN
INTRAMUSCULAR | Status: DC | PRN
  Administered 2018-09-13: 2 mg via INTRAVENOUS

## 2018-09-13 MED ORDER — POVIDONE-IODINE 10 % EX SWAB
2.0000 "application " | Freq: Once | CUTANEOUS | Status: AC
  Administered 2018-09-13: 2 via TOPICAL

## 2018-09-13 MED ORDER — OXYCODONE HCL 5 MG PO TABS
ORAL_TABLET | ORAL | Status: AC
  Administered 2018-09-13: 15:00:00 5 mg via ORAL
  Filled 2018-09-13: qty 1

## 2018-09-13 MED ORDER — HYDROMORPHONE HCL 1 MG/ML IJ SOLN
INTRAMUSCULAR | Status: AC
  Administered 2018-09-13: 15:00:00 1 mg via INTRAVENOUS
  Filled 2018-09-13: qty 1

## 2018-09-13 MED ORDER — GABAPENTIN 300 MG PO CAPS
300.0000 mg | ORAL_CAPSULE | Freq: Three times a day (TID) | ORAL | Status: DC
  Administered 2018-09-13 – 2018-09-14 (×4): 300 mg via ORAL
  Filled 2018-09-13 (×3): qty 1

## 2018-09-13 MED ORDER — VANCOMYCIN HCL 1000 MG IV SOLR
INTRAVENOUS | Status: AC
  Filled 2018-09-13: qty 1000

## 2018-09-13 MED ORDER — ALUM & MAG HYDROXIDE-SIMETH 200-200-20 MG/5ML PO SUSP: 30.0000 mL | ORAL | Status: DC | PRN

## 2018-09-13 MED ORDER — PHENOL 1.4 % MT LIQD: 1.0000 | OROMUCOSAL | Status: DC | PRN

## 2018-09-13 MED ORDER — TRANEXAMIC ACID-NACL 1000-0.7 MG/100ML-% IV SOLN
INTRAVENOUS | Status: AC
  Filled 2018-09-13: qty 100

## 2018-09-13 MED ORDER — METOCLOPRAMIDE HCL 5 MG PO TABS: 5.0000 mg | ORAL_TABLET | Freq: Three times a day (TID) | ORAL | Status: DC | PRN

## 2018-09-13 MED ORDER — PHENYLEPHRINE 40 MCG/ML (10ML) SYRINGE FOR IV PUSH (FOR BLOOD PRESSURE SUPPORT)
PREFILLED_SYRINGE | INTRAVENOUS | Status: AC
  Filled 2018-09-13: qty 10

## 2018-09-13 MED ORDER — LIDOCAINE 2% (20 MG/ML) 5 ML SYRINGE
INTRAMUSCULAR | Status: DC | PRN
  Administered 2018-09-13: 40 mg via INTRAVENOUS

## 2018-09-13 MED ORDER — ONDANSETRON HCL 4 MG/2ML IJ SOLN
INTRAMUSCULAR | Status: AC
  Filled 2018-09-13: qty 4

## 2018-09-13 MED ORDER — ONDANSETRON HCL 4 MG PO TABS: 4.0000 mg | ORAL_TABLET | Freq: Four times a day (QID) | ORAL | Status: DC | PRN

## 2018-09-13 MED ORDER — LIDOCAINE 2% (20 MG/ML) 5 ML SYRINGE
INTRAMUSCULAR | Status: AC
  Filled 2018-09-13: qty 10

## 2018-09-13 MED ORDER — LACTATED RINGERS IV SOLN
INTRAVENOUS | Status: DC
  Administered 2018-09-13 (×2): via INTRAVENOUS

## 2018-09-13 MED ORDER — PHENYLEPHRINE 40 MCG/ML (10ML) SYRINGE FOR IV PUSH (FOR BLOOD PRESSURE SUPPORT)
PREFILLED_SYRINGE | INTRAVENOUS | Status: DC | PRN
  Administered 2018-09-13: 80 ug via INTRAVENOUS

## 2018-09-13 MED ORDER — ACETAMINOPHEN 325 MG PO TABS: 325.0000 mg | ORAL_TABLET | Freq: Four times a day (QID) | ORAL | Status: DC | PRN

## 2018-09-13 MED ORDER — OXYCODONE HCL 5 MG PO TABS
5.0000 mg | ORAL_TABLET | ORAL | Status: DC | PRN
  Administered 2018-09-13 – 2018-09-14 (×3): 10 mg via ORAL
  Filled 2018-09-13 (×4): qty 2

## 2018-09-13 MED ORDER — BUPIVACAINE-EPINEPHRINE (PF) 0.25% -1:200000 IJ SOLN
INTRAMUSCULAR | Status: AC
  Filled 2018-09-13: qty 30

## 2018-09-13 MED ORDER — CHLORHEXIDINE GLUCONATE 4 % EX LIQD: 60.0000 mL | Freq: Once | CUTANEOUS | Status: DC

## 2018-09-13 MED ORDER — SODIUM CHLORIDE 0.9 % IV SOLN
INTRAVENOUS | Status: DC | PRN
  Administered 2018-09-13: 25 ug/min via INTRAVENOUS

## 2018-09-13 MED ORDER — ASPIRIN 81 MG PO CHEW
81.0000 mg | CHEWABLE_TABLET | Freq: Two times a day (BID) | ORAL | Status: DC
  Administered 2018-09-13 – 2018-09-14 (×2): 81 mg via ORAL
  Filled 2018-09-13 (×2): qty 1

## 2018-09-13 MED ORDER — FENTANYL CITRATE (PF) 100 MCG/2ML IJ SOLN
INTRAMUSCULAR | Status: DC | PRN
  Administered 2018-09-13: 50 ug via INTRAVENOUS

## 2018-09-13 MED ORDER — KETOROLAC TROMETHAMINE 15 MG/ML IJ SOLN
15.0000 mg | Freq: Four times a day (QID) | INTRAMUSCULAR | Status: AC
  Administered 2018-09-13 – 2018-09-14 (×4): 15 mg via INTRAVENOUS
  Filled 2018-09-13 (×2): qty 1

## 2018-09-13 MED ORDER — SODIUM CHLORIDE 0.9 % IV SOLN
INTRAVENOUS | Status: DC
  Administered 2018-09-13: 16:00:00 via INTRAVENOUS

## 2018-09-13 MED ORDER — SORBITOL 70 % SOLN: 30.0000 mL | Freq: Every day | Status: DC | PRN

## 2018-09-13 MED ORDER — VANCOMYCIN HCL 1 G IV SOLR
INTRAVENOUS | Status: DC | PRN
  Administered 2018-09-13: 1000 mg via TOPICAL

## 2018-09-13 MED ORDER — TRANEXAMIC ACID-NACL 1000-0.7 MG/100ML-% IV SOLN
1000.0000 mg | INTRAVENOUS | Status: AC
  Administered 2018-09-13: 12:00:00 1000 mg via INTRAVENOUS

## 2018-09-13 MED ORDER — KETOROLAC TROMETHAMINE 15 MG/ML IJ SOLN
INTRAMUSCULAR | Status: AC
  Administered 2018-09-13: 15 mg via INTRAVENOUS
  Filled 2018-09-13: qty 1

## 2018-09-13 MED ORDER — CEFAZOLIN SODIUM-DEXTROSE 2-3 GM-%(50ML) IV SOLR
INTRAVENOUS | Status: DC | PRN
  Administered 2018-09-13: 2 g via INTRAVENOUS

## 2018-09-13 MED ORDER — 0.9 % SODIUM CHLORIDE (POUR BTL) OPTIME
TOPICAL | Status: DC | PRN
  Administered 2018-09-13: 1000 mL

## 2018-09-13 MED ORDER — OXYCODONE HCL 5 MG PO TABS
5.0000 mg | ORAL_TABLET | Freq: Once | ORAL | Status: AC | PRN
  Administered 2018-09-13: 15:00:00 5 mg via ORAL

## 2018-09-13 MED ORDER — OXYCODONE HCL ER 10 MG PO T12A
10.0000 mg | EXTENDED_RELEASE_TABLET | Freq: Two times a day (BID) | ORAL | Status: DC
  Administered 2018-09-13 – 2018-09-14 (×3): 10 mg via ORAL
  Filled 2018-09-13 (×3): qty 1

## 2018-09-13 MED ORDER — ONDANSETRON HCL 4 MG/2ML IJ SOLN
INTRAMUSCULAR | Status: DC | PRN
  Administered 2018-09-13: 4 mg via INTRAVENOUS

## 2018-09-13 MED ORDER — MIDAZOLAM HCL 2 MG/2ML IJ SOLN
INTRAMUSCULAR | Status: AC
  Filled 2018-09-13: qty 2

## 2018-09-13 MED ORDER — MENTHOL 3 MG MT LOZG: 1.0000 | LOZENGE | OROMUCOSAL | Status: DC | PRN

## 2018-09-13 MED ORDER — METOCLOPRAMIDE HCL 5 MG/ML IJ SOLN: 5.0000 mg | Freq: Three times a day (TID) | INTRAMUSCULAR | Status: DC | PRN

## 2018-09-13 MED ORDER — METHOCARBAMOL 500 MG PO TABS
ORAL_TABLET | ORAL | Status: AC
  Administered 2018-09-13: 750 mg via ORAL
  Filled 2018-09-13: qty 1

## 2018-09-13 MED ORDER — ONDANSETRON HCL 4 MG/2ML IJ SOLN: 4.0000 mg | Freq: Once | INTRAMUSCULAR | Status: DC | PRN

## 2018-09-13 SURGICAL SUPPLY — 64 items
BAG DECANTER FOR FLEXI CONT (MISCELLANEOUS) ×3 IMPLANT
CELLS DAT CNTRL 66122 CELL SVR (MISCELLANEOUS) ×1 IMPLANT
COVER PERINEAL POST (MISCELLANEOUS) ×3 IMPLANT
COVER SURGICAL LIGHT HANDLE (MISCELLANEOUS) ×3 IMPLANT
COVER WAND RF STERILE (DRAPES) ×3 IMPLANT
DERMABOND ADHESIVE PROPEN (GAUZE/BANDAGES/DRESSINGS) ×2
DERMABOND ADVANCED .7 DNX6 (GAUZE/BANDAGES/DRESSINGS) IMPLANT
DRAPE C-ARM 42X72 X-RAY (DRAPES) ×3 IMPLANT
DRAPE POUCH INSTRU U-SHP 10X18 (DRAPES) ×3 IMPLANT
DRAPE STERI IOBAN 125X83 (DRAPES) ×3 IMPLANT
DRAPE U-SHAPE 47X51 STRL (DRAPES) ×6 IMPLANT
DRSG AQUACEL AG ADV 3.5X10 (GAUZE/BANDAGES/DRESSINGS) ×3 IMPLANT
DURAPREP 26ML APPLICATOR (WOUND CARE) ×6 IMPLANT
ELECT BLADE 4.0 EZ CLEAN MEGAD (MISCELLANEOUS) ×3
ELECT REM PT RETURN 9FT ADLT (ELECTROSURGICAL) ×3
ELECTRODE BLDE 4.0 EZ CLN MEGD (MISCELLANEOUS) ×1 IMPLANT
ELECTRODE REM PT RTRN 9FT ADLT (ELECTROSURGICAL) ×1 IMPLANT
GLOVE BIOGEL M 6.5 STRL (GLOVE) ×4 IMPLANT
GLOVE BIOGEL PI IND STRL 6.5 (GLOVE) IMPLANT
GLOVE BIOGEL PI IND STRL 7.0 (GLOVE) ×1 IMPLANT
GLOVE BIOGEL PI IND STRL 7.5 (GLOVE) IMPLANT
GLOVE BIOGEL PI INDICATOR 6.5 (GLOVE) ×2
GLOVE BIOGEL PI INDICATOR 7.0 (GLOVE) ×2
GLOVE BIOGEL PI INDICATOR 7.5 (GLOVE) ×2
GLOVE ECLIPSE 7.0 STRL STRAW (GLOVE) ×6 IMPLANT
GLOVE SKINSENSE NS SZ7.5 (GLOVE) ×2
GLOVE SKINSENSE STRL SZ7.5 (GLOVE) ×1 IMPLANT
GLOVE SURG SYN 7.5  E (GLOVE) ×8
GLOVE SURG SYN 7.5 E (GLOVE) ×4 IMPLANT
GLOVE SURG SYN 7.5 PF PI (GLOVE) ×4 IMPLANT
GOWN STRL REIN XL XLG (GOWN DISPOSABLE) ×5 IMPLANT
GOWN STRL REUS W/ TWL LRG LVL3 (GOWN DISPOSABLE) IMPLANT
GOWN STRL REUS W/ TWL XL LVL3 (GOWN DISPOSABLE) ×1 IMPLANT
GOWN STRL REUS W/TWL LRG LVL3 (GOWN DISPOSABLE)
GOWN STRL REUS W/TWL XL LVL3 (GOWN DISPOSABLE) ×2
HANDPIECE INTERPULSE COAX TIP (DISPOSABLE) ×2
HEAD CERAMIC DELTA 36 PLUS 1.5 (Hips) ×2 IMPLANT
HOOD PEEL AWAY FLYTE STAYCOOL (MISCELLANEOUS) ×8 IMPLANT
IV NS IRRIG 3000ML ARTHROMATIC (IV SOLUTION) ×3 IMPLANT
KIT BASIN OR (CUSTOM PROCEDURE TRAY) ×3 IMPLANT
LINER NEUTRAL 52X36MM PLUS 4 (Liner) ×2 IMPLANT
MARKER SKIN DUAL TIP RULER LAB (MISCELLANEOUS) ×3 IMPLANT
NDL SPNL 18GX3.5 QUINCKE PK (NEEDLE) ×1 IMPLANT
NEEDLE SPNL 18GX3.5 QUINCKE PK (NEEDLE) ×3 IMPLANT
PACK TOTAL JOINT (CUSTOM PROCEDURE TRAY) ×3 IMPLANT
PACK UNIVERSAL I (CUSTOM PROCEDURE TRAY) ×3 IMPLANT
PIN SECTOR W/GRIP ACE CUP 52MM (Hips) ×2 IMPLANT
RETRACTOR WND ALEXIS 18 MED (MISCELLANEOUS) IMPLANT
RTRCTR WOUND ALEXIS 18CM MED (MISCELLANEOUS) ×3
SAW OSC TIP CART 19.5X105X1.3 (SAW) ×5 IMPLANT
SCREW 6.5MMX25MM (Screw) ×2 IMPLANT
SET HNDPC FAN SPRY TIP SCT (DISPOSABLE) ×1 IMPLANT
STAPLER VISISTAT 35W (STAPLE) IMPLANT
STEM FEM ACTIS HIGH SZ1 (Stem) ×2 IMPLANT
SUT ETHIBOND 2 V 37 (SUTURE) ×3 IMPLANT
SUT ETHILON 2 0 FS 18 (SUTURE) ×6 IMPLANT
SUT VIC AB 1 CTX 36 (SUTURE) ×2
SUT VIC AB 1 CTX36XBRD ANBCTR (SUTURE) ×1 IMPLANT
SUT VIC AB 2-0 CT1 27 (SUTURE) ×4
SUT VIC AB 2-0 CT1 TAPERPNT 27 (SUTURE) ×2 IMPLANT
SYR 50ML LL SCALE MARK (SYRINGE) ×3 IMPLANT
TOWEL GREEN STERILE (TOWEL DISPOSABLE) ×3 IMPLANT
TRAY CATH 16FR W/PLASTIC CATH (SET/KITS/TRAYS/PACK) ×2 IMPLANT
YANKAUER SUCT BULB TIP NO VENT (SUCTIONS) ×3 IMPLANT

## 2018-09-13 NOTE — H&P (Signed)
PREOPERATIVE H&P  Chief Complaint: right hip degenerative joint disease  HPI: Brenda [name] is a 48 y.o. female who presents for surgical treatment of right hip degenerative joint disease.  She denies any changes in medical history.  Past Medical History:  Diagnosis Date  . Ankle fracture, left    in past.  . Anxiety   . Arthritis    Right hip, scheduled for replacement 7/13/90m, left ankle  . Asthma   . Bipolar disorder (HCC)   . Chronic hepatitis C (HCC)   . Depression   . Dyspnea    occasional with exertion  . GERD (gastroesophageal reflux disease)    [facility]  . Headache   . Hearing loss    some loss in both ears - no dx - per patient "runs in family", no hearing aids  . Hypertension   . Lump in female breast    right  . Pneumonia    x 1  . Polysubstance abuse (HCC)   . Primary localized osteoarthritis of hip    right  . SVD (spontaneous vaginal delivery)    x 3  . Uses roller walker    occasional  . Wears dentures    full upper   Past Surgical History:  Procedure Laterality Date  . TUBAL LIGATION     Social History   Socioeconomic History  . Marital status: Single    Spouse name: Not on file  . Number of children: Not on file  . Years of education: Not on file  . Highest education level: Not on file  Occupational History  . Not on file  Social Needs  . Financial resource strain: Not on file  . Food insecurity    Worry: Not on file    Inability: Not on file  . Transportation needs    Medical: Not on file    Non-medical: Not on file  Tobacco Use  . Smoking status: Current Every Day Smoker    Packs/day: 1.00    Years: 31.00    Pack years: 31.00    Types: Cigarettes  . Smokeless tobacco: Never Used  . Tobacco comment: since age 48  Substance and Sexual Activity  . Alcohol use: No  . 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 Last cocaine 08/02/18  . Sexual  activity: Yes    Birth control/protection: Surgical  Lifestyle  . Physical activity    Days per week: Not on file    Minutes per session: Not on file  . Stress: Not on file  Relationships  . Social Musicianconnections    Talks on phone: Not on file    Gets together: Not on file    Attends religious service: Not on file    Active member of club or organization: Not on file    Attends meetings of clubs or organizations: Not on file    Relationship status: Not on file  Other Topics Concern  . Not on file  Social History Narrative  . Not on file   History reviewed. No pertinent family history. Allergies  Allergen Reactions  . Flexeril [Cyclobenzaprine] Other (See Comments)    RLS-like symptoms   . Seroquel [Quetiapine Fumarate] Other (See Comments)    RLS-like symptoms   Prior to Admission medications   Medication Sig Start Date End Date Taking? Authorizing Provider  aspirin EC 81 MG tablet Take  1 tablet (81 mg total) by mouth 2 (two) times daily. 08/09/18  Yes Leandrew Koyanagi, MD  ferrous sulfate 325 (65 FE) MG tablet Take 1 tablet (325 mg total) by mouth daily with breakfast. 07/14/18  Yes Iloabachie, Chioma E, NP  ibuprofen (ADVIL) 800 MG tablet Take 1 tablet (800 mg total) by mouth every 8 (eight) hours as needed for moderate pain. 07/14/18  Yes Iloabachie, Chioma E, NP  methocarbamol (ROBAXIN) 750 MG tablet Take 1 tablet (750 mg total) by mouth 2 (two) times daily as needed for muscle spasms. 08/09/18  Yes Leandrew Koyanagi, MD  oxyCODONE-acetaminophen (PERCOCET) 5-325 MG tablet Take 1-2 tablets by mouth every 8 (eight) hours as needed for severe pain. 09/06/18  Yes Leandrew Koyanagi, MD  promethazine (PHENERGAN) 25 MG tablet Take 1 tablet (25 mg total) by mouth every 6 (six) hours as needed for nausea. Patient not taking: Reported on 09/01/2018 08/09/18   Leandrew Koyanagi, MD  senna-docusate (SENOKOT S) 8.6-50 MG tablet Take 1-2 tablets by mouth at bedtime as needed. Patient not taking: Reported on  09/01/2018 08/09/18   Leandrew Koyanagi, MD     Positive ROS: All other systems have been reviewed and were otherwise negative with the exception of those mentioned in the HPI and as above.  Physical Exam: General: Alert, no acute distress Cardiovascular: No pedal edema Respiratory: No cyanosis, no use of accessory musculature GI: abdomen soft Skin: No lesions in the area of chief complaint Neurologic: Sensation intact distally Psychiatric: Patient is competent for consent with normal mood and affect Lymphatic: no lymphedema  MUSCULOSKELETAL: exam stable  Assessment: right hip degenerative joint disease  Plan: Plan for Procedure(s): RIGHT TOTAL HIP ARTHROPLASTY ANTERIOR APPROACH  The risks benefits and alternatives were discussed with the patient including but not limited to the risks of nonoperative treatment, versus surgical intervention including infection, bleeding, nerve injury,  blood clots, cardiopulmonary complications, morbidity, mortality, among others, and they were willing to proceed.   Preoperative templating of the joint replacement has been completed, documented, and submitted to the Operating Room personnel in order to optimize intra-operative equipment management.  Eduard Roux, MD   09/13/2018 9:55 AM

## 2018-09-13 NOTE — Anesthesia Procedure Notes (Signed)
Procedure Name: MAC Date/Time: 09/13/2018 11:10 AM Performed by: Candis Shine, CRNA Pre-anesthesia Checklist: Patient identified, Emergency Drugs available, Suction available, Patient being monitored and Timeout performed Patient Re-evaluated:Patient Re-evaluated prior to induction Oxygen Delivery Method: Simple face mask Preoxygenation: Pre-oxygenation with 100% oxygen Dental Injury: Teeth and Oropharynx as per pre-operative assessment

## 2018-09-13 NOTE — Anesthesia Postprocedure Evaluation (Signed)
Anesthesia Post Note  Patient: Brenda [name]  Procedure(s) Performed: RIGHT TOTAL HIP ARTHROPLASTY ANTERIOR APPROACH (Right )     Patient location during evaluation: PACU Anesthesia Type: Spinal Level of consciousness: oriented and awake and alert Pain management: pain level controlled Vital Signs Assessment: post-procedure vital signs reviewed and stable Respiratory status: spontaneous breathing, respiratory function stable and nonlabored ventilation Cardiovascular status: blood pressure returned to baseline and stable Postop Assessment: no headache, no backache, no apparent nausea or vomiting and spinal receding Anesthetic complications: no    Last Vitals:  Vitals:   09/13/18 1510 09/13/18 1535  BP:  128/88  Pulse:  91  Resp:  16  Temp: 36.6 C 37 C  SpO2:  94%      Lidia Collum

## 2018-09-13 NOTE — Anesthesia Preprocedure Evaluation (Addendum)
Anesthesia Evaluation  Patient identified by MRN, date of birth, ID band Patient awake    Reviewed: Allergy & Precautions, NPO status , Patient's Chart, lab work & pertinent test results  History of Anesthesia Complications Negative for: history of anesthetic complications  Airway Mallampati: II  TM Distance: >3 FB Neck ROM: Full    Dental  (+) Edentulous Upper, Poor Dentition, Loose,    Pulmonary asthma , Current Smoker and Patient abstained from smoking.,    Pulmonary exam normal        Cardiovascular hypertension, negative cardio ROS Normal cardiovascular exam     Neuro/Psych PSYCHIATRIC DISORDERS Anxiety Depression Bipolar Disorder negative neurological ROS     GI/Hepatic GERD  ,(+)     substance abuse  cocaine use, Hepatitis -, C  Endo/Other  negative endocrine ROS  Renal/GU negative Renal ROS  negative genitourinary   Musculoskeletal  (+) Arthritis , narcotic dependent  Abdominal   Peds  Hematology negative hematology ROS (+)   Anesthesia Other Findings   Reproductive/Obstetrics                            Anesthesia Physical Anesthesia Plan  ASA: III  Anesthesia Plan: Spinal   Post-op Pain Management:    Induction:   PONV Risk Score and Plan: 1 and Propofol infusion, Midazolam and Treatment may vary due to age or medical condition  Airway Management Planned: Nasal Cannula and Simple Face Mask  Additional Equipment: None  Intra-op Plan:   Post-operative Plan:   Informed Consent: I have reviewed the patients History and Physical, chart, labs and discussed the procedure including the risks, benefits and alternatives for the proposed anesthesia with the patient or authorized representative who has indicated his/her understanding and acceptance.       Plan Discussed with:   Anesthesia Plan Comments:         Anesthesia Quick Evaluation

## 2018-09-13 NOTE — Plan of Care (Signed)

## 2018-09-13 NOTE — Discharge Instructions (Signed)

## 2018-09-13 NOTE — Transfer of Care (Signed)
Immediate Anesthesia Transfer of Care Note  Patient: Brenda [name]  Procedure(s) Performed: RIGHT TOTAL HIP ARTHROPLASTY ANTERIOR APPROACH (Right )  Patient Location: PACU  Anesthesia Type:MAC and Spinal  Level of Consciousness: awake, alert  and oriented  Airway & Oxygen Therapy: Patient Spontanous Breathing and Patient connected to face mask oxygen  Post-op Assessment: Report given to RN and Post -op Vital signs reviewed and stable  Post vital signs: Reviewed and stable  Last Vitals:  Vitals Value Taken Time  BP    Temp    Pulse 75 09/13/18 1319  Resp 14 09/13/18 1319  SpO2 100 % 09/13/18 1319  Vitals shown include unvalidated device data.  Last Pain:  Vitals:   09/13/18 0901  TempSrc:   PainSc: 6       Patients Stated Pain Goal: 3 (62/44/69 5072)  Complications: No apparent anesthesia complications

## 2018-09-13 NOTE — Evaluation (Signed)
Physical Therapy Evaluation Patient Details Name: Brenda RanaJennifer C [name] MRN: [id number] DOB: 02/29/1970 Today's Date: 09/13/2018   History of Present Illness  Pt is a 48 year old female presenting s/p R THA direct anterior approach. Relevant PMH includes anxiety, depression, bipolar disorder, asthma, chronic hepatits C, GERD, and HTN.  Clinical Impression  Pt presents with the above dx and the below deficits. Pt performs mobility tasks at the min G to min A level. Pt reports 10/10 pain. Due to pain, exercises and mobility were reduced. Pt will benefit from skilled acute physical therapy to promote functional independence and safety.    Follow Up Recommendations Follow surgeon's recommendation for DC plan and follow-up therapies    Equipment Recommendations  Rolling walker with 5" wheels;3in1 (PT)    Recommendations for Other Services       Precautions / Restrictions Precautions Precautions: Fall Restrictions Weight Bearing Restrictions: Yes RLE Weight Bearing: Weight bearing as tolerated      Mobility  Bed Mobility Overal bed mobility: Needs Assistance Bed Mobility: Supine to Sit     Supine to sit: Min assist     General bed mobility comments: Pt requires assistance with RLE movement to EOB.  Transfers Overall transfer level: Needs assistance Equipment used: Rolling walker (2 wheeled) Transfers: Sit to/from Stand Sit to Stand: Min assist         General transfer comment: Pt requires lift assistance. VCs for hand placement on walker necessary for STS transfer.  Ambulation/Gait Ambulation/Gait assistance: Min guard Gait Distance (Feet): 1 Feet Assistive device: Rolling walker (2 wheeled) Gait Pattern/deviations: Step-to pattern;Decreased stance time - right;Decreased step length - left;Antalgic Gait velocity: decreased   General Gait Details: Pt ambulates with antalgic gait and a step to pattern. Stance time on R decreased. Step length on L decreased. Further  ambulation limited secondary to pain.  Stairs            Wheelchair Mobility    Modified Rankin (Stroke Patients Only)       Balance Overall balance assessment: Needs assistance Sitting-balance support: Feet supported;No upper extremity supported Sitting balance-Leahy Scale: Fair     Standing balance support: Bilateral upper extremity supported Standing balance-Leahy Scale: Poor Standing balance comment: Pt requires UE support and external support to maintain standing balance.                             Pertinent Vitals/Pain Pain Assessment: 0-10 Pain Score: 10-Worst pain ever Pain Location: R hip Pain Descriptors / Indicators: Burning;Constant;Sharp Pain Intervention(s): Monitored during session;Limited activity within patient's tolerance;Repositioned;Relaxation    Home Living Family/patient expects to be discharged to:: Private residence Living Arrangements: Children Available Help at Discharge: Family;Available 24 hours/day Type of Home: House Home Access: Stairs to enter Entrance Stairs-Rails: Can reach both Entrance Stairs-Number of Steps: 5 Home Layout: One level Home Equipment: Walker - 4 wheels;Cane - single point      Prior Function Level of Independence: Independent with assistive device(s)         Comments: Pt was able to ambulate, but required increased time to complete distances with a cane and rollator.     Hand Dominance        Extremity/Trunk Assessment   Upper Extremity Assessment Upper Extremity Assessment: Overall WFL for tasks assessed    Lower Extremity Assessment Lower Extremity Assessment: Generalized weakness;RLE deficits/detail RLE Deficits / Details: pain and weakness as expected following THR    Cervical / Trunk Assessment Cervical /  Trunk Assessment: Normal  Communication   Communication: No difficulties  Cognition Arousal/Alertness: Awake/alert Behavior During Therapy: WFL for tasks  assessed/performed Overall Cognitive Status: Within Functional Limits for tasks assessed                                        General Comments      Exercises Total Joint Exercises Ankle Circles/Pumps: AROM;Both;20 reps;Supine Quad Sets: AROM;Right;5 reps;Supine(Pt unable to complete 10 reps secondary to pain.)   Assessment/Plan    PT Assessment Patient needs continued PT services  PT Problem List Decreased strength;Decreased range of motion;Decreased activity tolerance;Decreased balance;Decreased mobility;Decreased knowledge of use of DME;Decreased safety awareness;Pain       PT Treatment Interventions DME instruction;Gait training;Stair training;Functional mobility training;Therapeutic activities;Therapeutic exercise;Balance training;Patient/family education    PT Goals (Current goals can be found in the Care Plan section)  Acute Rehab PT Goals Patient Stated Goal: walk without pain PT Goal Formulation: With patient Time For Goal Achievement: 09/27/18 Potential to Achieve Goals: Good    Frequency 7X/week   Barriers to discharge        Co-evaluation               AM-PAC PT "6 Clicks" Mobility  Outcome Measure Help needed turning from your back to your side while in a flat bed without using bedrails?: A Little Help needed moving from lying on your back to sitting on the side of a flat bed without using bedrails?: A Little Help needed moving to and from a bed to a chair (including a wheelchair)?: A Little Help needed standing up from a chair using your arms (e.g., wheelchair or bedside chair)?: A Little Help needed to walk in hospital room?: A Lot Help needed climbing 3-5 steps with a railing? : A Lot 6 Click Score: 16    End of Session Equipment Utilized During Treatment: Gait belt Activity Tolerance: Patient limited by pain Patient left: in chair;with call bell/phone within reach Nurse Communication: Mobility status(Communicate with NT  regarding assist level.) PT Visit Diagnosis: Unsteadiness on feet (R26.81);Other abnormalities of gait and mobility (R26.89);Muscle weakness (generalized) (M62.81);Difficulty in walking, not elsewhere classified (R26.2);Pain Pain - Right/Left: Right Pain - part of body: Hip    Time: 1275-1700 PT Time Calculation (min) (ACUTE ONLY): 27 min   Charges:   PT Evaluation $PT Eval Low Complexity: 1 Low PT Treatments $Therapeutic Activity: 8-22 mins        Christophe Louis, SPT  Christa Fasig 09/13/2018, 7:00 PM

## 2018-09-13 NOTE — Social Work (Signed)
CSW acknowledging consult for SNF placement. Will follow for therapy recommendations.   Jerrye Seebeck, MSW, LCSWA [facility] [phone]   

## 2018-09-14 ENCOUNTER — Telehealth: Payer: Self-pay

## 2018-09-14 ENCOUNTER — Encounter (HOSPITAL_COMMUNITY): Payer: Self-pay | Admitting: General Practice

## 2018-09-14 LAB — BASIC METABOLIC PANEL
Anion gap: 8 (ref 5–15)
BUN: 11 mg/dL (ref 6–20)
CO2: 25 mmol/L (ref 22–32)
Calcium: 8.2 mg/dL — ABNORMAL LOW (ref 8.9–10.3)
Chloride: 103 mmol/L (ref 98–111)
Creatinine, Ser: 0.92 mg/dL (ref 0.44–1.00)
GFR calc Af Amer: >60 mL/min (ref >60)
GFR calc non Af Amer: >60 mL/min (ref >60)
Glucose, Bld: 99 mg/dL (ref 70–99)
Potassium: 4 mmol/L (ref 3.5–5.1)
Sodium: 136 mmol/L (ref 135–145)

## 2018-09-14 LAB — CBC
HCT: 34.3 % — ABNORMAL LOW (ref 36.0–46.0)
Hemoglobin: 10.3 g/dL — ABNORMAL LOW (ref 12.0–15.0)
MCH: 21.8 pg — ABNORMAL LOW (ref 26.0–34.0)
MCHC: 30 g/dL (ref 30.0–36.0)
MCV: 72.7 fL — ABNORMAL LOW (ref 80.0–100.0)
Platelets: 272 10*3/uL (ref 150–400)
RBC: 4.72 MIL/uL (ref 3.87–5.11)
RDW: 21.2 % — ABNORMAL HIGH (ref 11.5–15.5)
WBC: 9.1 10*3/uL (ref 4.0–10.5)
nRBC: 0 % (ref 0.0–0.2)

## 2018-09-14 MED ORDER — OXYCODONE HCL 5 MG PO TABS: 5.0000 mg | ORAL_TABLET | Freq: Three times a day (TID) | ORAL | 0 refills | Status: DC | PRN

## 2018-09-14 MED ORDER — OXYCODONE HCL ER 10 MG PO T12A: 10.0000 mg | EXTENDED_RELEASE_TABLET | Freq: Two times a day (BID) | ORAL | 0 refills | Status: AC

## 2018-09-14 NOTE — Telephone Encounter (Signed)
Merrie Roof at [facility] on [address] called stating that patient would like a Rx for [employer] sent to [facility].  Cb# is [phone].  Please advise.  Thank you.

## 2018-09-14 NOTE — Plan of Care (Signed)

## 2018-09-14 NOTE — Progress Notes (Addendum)
Physical Therapy Treatment Patient Details Name: Brenda [name] MRN: [id number] DOB: 1970/10/31 Today's Date: 09/14/2018    History of Present Illness Pt is a 48 year old female presenting s/p R THA direct anterior approach. Relevant PMH includes anxiety, depression, bipolar disorder, asthma, chronic hepatits C, GERD, and HTN.    PT Comments    Pt performed gait training and functional mobility.  Reviewed stair training and seated/standing exercises from HEP.  Issued HEP for home use and reviewed frequency.  Pt is visably upset after learning a family member is actively dying.  She is eager to return home and is progressing well from a mobility stand point.    Follow Up Recommendations  Follow surgeon's recommendation for DC plan and follow-up therapies     Equipment Recommendations  Rolling walker with 5" wheels;3in1 (PT)    Recommendations for Other Services       Precautions / Restrictions Precautions Precautions: Fall Restrictions Weight Bearing Restrictions: Yes RLE Weight Bearing: Weight bearing as tolerated    Mobility  Bed Mobility Overal bed mobility: Needs Assistance Bed Mobility: Supine to Sit     Supine to sit: Supervision     General bed mobility comments: Pt in recliner on arrival.  Transfers Overall transfer level: Needs assistance Equipment used: Rolling walker (2 wheeled) Transfers: Sit to/from Stand Sit to Stand: Supervision         General transfer comment: Cues for safe hand placement and technique.  Ambulation/Gait Ambulation/Gait assistance: Supervision Gait Distance (Feet): 100 Feet Assistive device: Rolling walker (2 wheeled) Gait Pattern/deviations: Decreased stance time - right;Decreased step length - left;Antalgic;Step-through pattern     General Gait Details: Cues for knee flexion in swing phase and R toe off to avoid circumduction.  Cues for RW safety.   Stairs Stairs: Yes   Stair Management: One rail Right Number of  Stairs: 4 General stair comments: Cues for sequencing and hand placement on rail.  Pt slow and guarded.   Wheelchair Mobility    Modified Rankin (Stroke Patients Only)       Balance Overall balance assessment: Needs assistance Sitting-balance support: Feet supported;No upper extremity supported Sitting balance-Leahy Scale: Fair     Standing balance support: Bilateral upper extremity supported Standing balance-Leahy Scale: Fair Standing balance comment: Pt requires UE support and external support to maintain standing balance.                            Cognition Arousal/Alertness: Awake/alert Behavior During Therapy: Flat affect(very dismal/mournful) Overall Cognitive Status: Within Functional Limits for tasks assessed                                 General Comments: Pt actively in tears as she has just learned a family member is actively dying.      Exercises Total Joint Exercises Hip ABduction/ADduction: AROM;Right;5 reps;Standing Long Arc Quad: AROM;Right;5 reps;Seated Knee Flexion: AROM;Right;5 reps;Standing Marching in Standing: AROM;Right;5 reps;Standing Standing Hip Extension: AROM;Right;5 reps;Standing General Exercises - Lower Extremity Ankle Circles/Pumps: AROM;Both;20 reps;Supine Quad Sets: AROM;Right;10 reps;Supine Heel Slides: AAROM;Right;10 reps;Supine Hip ABduction/ADduction: AAROM;Right;10 reps;Supine    General Comments        Pertinent Vitals/Pain Pain Assessment: 0-10 Pain Score: 6  Pain Location: R hip Pain Descriptors / Indicators: Burning;Constant;Sharp Pain Intervention(s): Monitored during session;Repositioned    Home Living  Prior Function            PT Goals (current goals can now be found in the care plan section) Acute Rehab PT Goals Patient Stated Goal: To go home Potential to Achieve Goals: Good Progress towards PT goals: Progressing toward goals    Frequency     7X/week      PT Plan Current plan remains appropriate    Co-evaluation              AM-PAC PT "6 Clicks" Mobility   Outcome Measure  Help needed turning from your back to your side while in a flat bed without using bedrails?: A Little Help needed moving from lying on your back to sitting on the side of a flat bed without using bedrails?: A Little Help needed moving to and from a bed to a chair (including a wheelchair)?: A Little Help needed standing up from a chair using your arms (e.g., wheelchair or bedside chair)?: A Little Help needed to walk in hospital room?: A Little Help needed climbing 3-5 steps with a railing? : A Little 6 Click Score: 18    End of Session Equipment Utilized During Treatment: Gait belt Activity Tolerance: Patient limited by pain Patient left: in chair;with call bell/phone within reach Nurse Communication: Mobility status PT Visit Diagnosis: Unsteadiness on feet (R26.81);Other abnormalities of gait and mobility (R26.89);Muscle weakness (generalized) (M62.81);Difficulty in walking, not elsewhere classified (R26.2);Pain Pain - Right/Left: Right Pain - part of body: Hip     Time: 1325-1340 PT Time Calculation (min) (ACUTE ONLY): 15 min  Charges:  $Gait Training: 8-22 mins                     Brenda RuaAimee Arthurine [name], PTA Acute Rehabilitation Services Pager [phone] Office [phone]     Brenda [name] Brenda [name] 09/14/2018, 2:34 PM

## 2018-09-14 NOTE — TOC Initial Note (Signed)
Transition of Care [facility]) - Initial/Assessment Note    Patient Details  Name: Brenda [name] MRN: [id number] Date of Birth: 1970-12-17  Transition of Care [facility]) CM/SW Contact:    Marilu Favre, RN Phone Number: 09/14/2018, 11:25 AM  Clinical Narrative:                  Spoke to patient on phone. Referral for home health PT given to [facility] with Edison Nasuti with [facility] given DME orders. Expected Discharge Plan: [place] Barriers to Discharge: No Barriers Identified   Patient Goals and CMS Choice Patient states their goals for this hospitalization and ongoing recovery are:: to go home CMS Medicare.gov Compare Post Acute Care list provided to:: Patient Choice offered to / list presented to : Patient  Expected Discharge Plan and Services Expected Discharge Plan: [place]   Discharge Planning Services: CM Consult Post Acute Care Choice: Home Health, Durable Medical Equipment Living arrangements for the past 2 months: Single Family Home                 DME Arranged: 3-N-1, Walker rolling DME Agency: [facility] Date DME Agency Contacted: 09/14/18 Time DME Agency Contacted: 0938 Representative spoke with at DME Agency: [place]: PT [place]: [place] Date [place]: 09/14/18 Time [place]: 1829 Representative spoke with at [place]: Tommi Rumps  Prior Living Arrangements/Services Living arrangements for the past 2 months: [place] Lives with:: Self(son) Patient language and need for interpreter reviewed:: Yes Do you feel safe going back to the place where you live?: Yes      Need for Family Participation in Patient Care: Yes (Comment) Care giver support system in place?: Yes (comment)   Criminal Activity/Legal Involvement Pertinent to Current Situation/Hospitalization: No - Comment as needed  Activities of Daily Living Home Assistive Devices/Equipment: Walker (specify type) ADL  Screening (condition at time of admission) Patient's cognitive ability adequate to safely complete daily activities?: Yes Is the patient deaf or have difficulty hearing?: No Does the patient have difficulty seeing, even when wearing glasses/contacts?: No Does the patient have difficulty concentrating, remembering, or making decisions?: No Patient able to express need for assistance with ADLs?: Yes Does the patient have difficulty dressing or bathing?: No Independently performs ADLs?: Yes (appropriate for developmental age) Does the patient have difficulty walking or climbing stairs?: Yes Weakness of Legs: Right Weakness of Arms/Hands: None  Permission Sought/Granted   Permission granted to share information with : Yes, Verbal Permission Granted     Permission granted to share info w AGENCY: [facility], [facility]        Emotional Assessment   Attitude/Demeanor/Rapport: Engaged Affect (typically observed): Accepting Orientation: : Oriented to Self, Oriented to Place, Oriented to  Time, Oriented to Situation Alcohol / Substance Use: Not Applicable Psych Involvement: No (comment)  Admission diagnosis:  right hip degenerative joint disease Patient Active Problem List   Diagnosis Date Noted  . Status post total replacement of right hip 09/13/2018  . Leg edema 08/22/2018  . Primary osteoarthritis of right hip 07/27/2018  . Vaginal discharge 07/14/2018  . Sepsis ([place]) 04/30/2018  . Opioid use disorder, moderate, dependence ([place]) 05/02/2016  . Tobacco use disorder 05/02/2016  . Severe recurrent major depression without psychotic features ([place]) 05/01/2016  . Cocaine use disorder, moderate, dependence ([place]) 05/01/2016  . Alcohol abuse 05/01/2016  . GERD (gastroesophageal reflux disease) 05/01/2016   PCP:  System, Pcp Not  In Pharmacy:   [facility] [address] - [place] ([place]), [place] - [address] [place] [address] Phone: [phone] Fax:  [phone]  [facility] [place], [place] - [address] [place] [address] Phone: [phone] Fax: [phone]  [id number] [place]- [place], [place] - [address] AT [facility] [address] [place] [address] Phone: [phone] Fax: [phone]  [facility] [address] [place], [place] - [address] [place] [address] Phone: [phone] Fax: [phone]     Social Determinants of Health (SDOH) Interventions    Readmission Risk Interventions Readmission Risk Prevention Plan 05/24/2018  Transportation Screening Complete  HRI or Home Care Consult (No Data)  Palliative Care Screening Not Applicable  Medication Review (RN Care Manager) Complete  Some recent data might be hidden

## 2018-09-14 NOTE — Discharge Summary (Signed)
Physician Discharge Summary      Patient ID: Brenda [name] MRN: [id number] DOB/AGE: 02/15/1970 48 y.o.  Admit date: 09/13/2018 Discharge date: 09/14/2018  Admission Diagnoses:  Primary osteoarthritis of right hip  Discharge Diagnoses:  Principal Problem:   Primary osteoarthritis of right hip Active Problems:   Status post total replacement of right hip   Past Medical History:  Diagnosis Date   Ankle fracture, left    in past.   Anxiety    Arthritis    Right hip, scheduled for replacement 7/13/50m, left ankle   Asthma    Bipolar disorder (HCC)    Chronic hepatitis C (HCC)    Depression    Dyspnea    occasional with exertion   GERD (gastroesophageal reflux disease)    pmh   Headache    Hearing loss    some loss in both ears - no dx - per patient "runs in family", no hearing aids   Hypertension    Lump in female breast    right   Pneumonia    x 1   Polysubstance abuse (HCC)    Primary localized osteoarthritis of hip    right   SVD (spontaneous vaginal delivery)    x 3   Uses roller walker    occasional   Wears dentures    full upper    Surgeries: Procedure(s): RIGHT TOTAL HIP ARTHROPLASTY ANTERIOR APPROACH on 09/13/2018   Consultants (if any):   Discharged Condition: Improved  Hospital Course: Brenda [name] is an 48 y.o. female who was admitted 09/13/2018 with a diagnosis of Primary osteoarthritis of right hip and went to the operating room on 09/13/2018 and underwent the above named procedures.    She was given perioperative antibiotics:  Anti-infectives (From admission, onward)   Start     Dose/Rate Route Frequency Ordered Stop   09/13/18 1700  ceFAZolin (ANCEF) IVPB 2g/100 mL premix     2 g 200 mL/hr over 30 Minutes Intravenous Every 6 hours 09/13/18 1531 09/14/18 0538   09/13/18 1149  vancomycin (VANCOCIN) powder  Status:  Discontinued       As needed 09/13/18 1150 09/13/18 1314    .  She was given sequential  compression devices, early ambulation, and appropriate chemoprophylaxis for DVT prophylaxis.  She benefited maximally from the hospital stay and there were no complications.    Recent vital signs:  Vitals:   09/14/18 0431 09/14/18 0834  BP: 137/85 117/76  Pulse: 79 82  Resp: 16   Temp: 98.6 F (37 C) 98.4 F (36.9 C)  SpO2: 100% 95%    Recent laboratory studies:  Lab Results  Component Value Date   HGB 10.3 (L) 09/14/2018   HGB 12.7 09/03/2018   HGB 11.6 (L) 08/09/2018   Lab Results  Component Value Date   WBC 9.1 09/14/2018   PLT 272 09/14/2018   Lab Results  Component Value Date   INR 1.0 09/03/2018   Lab Results  Component Value Date   NA 136 09/14/2018   K 4.0 09/14/2018   CL 103 09/14/2018   CO2 25 09/14/2018   BUN 11 09/14/2018   CREATININE 0.92 09/14/2018   GLUCOSE 99 09/14/2018    Discharge Medications:   Allergies as of 09/14/2018      Reactions   Flexeril [cyclobenzaprine] Other (See Comments)   RLS-like symptoms    Seroquel [quetiapine Fumarate] Other (See Comments)   RLS-like symptoms      Medication List    TAKE  these medications   aspirin EC 81 MG tablet Take 1 tablet (81 mg total) by mouth 2 (two) times daily.   ferrous sulfate 325 (65 FE) MG tablet Take 1 tablet (325 mg total) by mouth daily with breakfast. Notes to patient: Resume home regimen   ibuprofen 800 MG tablet Commonly known as: ADVIL Take 1 tablet (800 mg total) by mouth every 8 (eight) hours as needed for moderate pain.   methocarbamol 750 MG tablet Commonly known as: ROBAXIN Take 1 tablet (750 mg total) by mouth 2 (two) times daily as needed for muscle spasms.   oxyCODONE-acetaminophen 5-325 MG tablet Commonly known as: Percocet Take 1-2 tablets by mouth every 8 (eight) hours as needed for severe pain.   promethazine 25 MG tablet Commonly known as: PHENERGAN Take 1 tablet (25 mg total) by mouth every 6 (six) hours as needed for nausea.   senna-docusate 8.6-50 MG  tablet Commonly known as: Senokot S Take 1-2 tablets by mouth at bedtime as needed.     ASK your doctor about these medications   oxyCODONE 5 MG immediate release tablet Commonly known as: Oxy IR/ROXICODONE Take 1-2 tablets (5-10 mg total) by mouth every 8 (eight) hours as needed for severe pain.   oxyCODONE 10 mg 12 hr tablet Commonly known as: OXYCONTIN Take 1 tablet (10 mg total) by mouth every 12 (twelve) hours for 3 days.       Diagnostic Studies: Dg Chest 2 View  Result Date: 09/03/2018 CLINICAL DATA:  Preoperative study prior to total hip replacement. EXAM: CHEST - 2 VIEW COMPARISON:  May 23, 2018 FINDINGS: The heart size and mediastinal contours are within normal limits. Both lungs are clear. The visualized skeletal structures are unremarkable. IMPRESSION: No active cardiopulmonary disease. Electronically Signed   By: Dorise Bullion III M.D   On: 09/03/2018 15:49   Dg Ankle Complete Left  Result Date: 08/24/2018 CLINICAL DATA:  Fall, left ankle pain EXAM: LEFT ANKLE COMPLETE - 3+ VIEW COMPARISON:  05/31/2013 FINDINGS: No fracture or dislocation is seen. The ankle mortise is intact. The base of the fifth metatarsal is unremarkable. Small plantar calcaneal enthesophyte. Mild lateral soft tissue swelling. IMPRESSION: No fracture or dislocation is seen. Mild lateral soft tissue swelling. Electronically Signed   By: Julian Hy M.D.   On: 08/24/2018 03:11   Dg Pelvis Portable  Result Date: 09/13/2018 CLINICAL DATA:  Postop hip replacement EXAM: PORTABLE PELVIS 1-2 VIEWS COMPARISON:  Intraoperative imaging today FINDINGS: Changes of right hip replacement. Normal AP alignment. No hardware bony complicating feature. IMPRESSION: Right hip replacement.  No visible complicating feature. Electronically Signed   By: Rolm Baptise M.D.   On: 09/13/2018 13:27   Dg Knee Complete 4 Views Right  Result Date: 08/24/2018 CLINICAL DATA:  Fall, right knee pain EXAM: RIGHT KNEE - COMPLETE 4+  VIEW COMPARISON:  None. FINDINGS: No fracture or dislocation is seen. The joint spaces are preserved. The visualized soft tissues are unremarkable. Small suprapatellar knee joint effusion. IMPRESSION: No fracture or dislocation is seen. Small suprapatellar knee joint effusion. Electronically Signed   By: Julian Hy M.D.   On: 08/24/2018 03:10   Dg C-arm 1-60 Min  Result Date: 09/13/2018 CLINICAL DATA:  RIGHT total hip anterior approach. EXAM: OPERATIVE RIGHT HIP (WITH PELVIS IF PERFORMED) 6 VIEWS TECHNIQUE: Fluoroscopic spot image(s) were submitted for interpretation post-operatively. COMPARISON:  None. FINDINGS: Six intraoperative fluoroscopic images are provided. Final images show arthroplasty hardware at the RIGHT hip. Hardware appears appropriately positioned. Osseous  alignment appears anatomic. Fluoroscopy provided for 23 seconds. IMPRESSION: Intraoperative fluoroscopic images showing arthroplasty hardware at the RIGHT hip. Hardware appears appropriately positioned. No evidence of surgical complicating feature. Electronically Signed   By: Bary RichardStan  Maynard M.D.   On: 09/13/2018 12:53   Dg Hip Operative Unilat W Or W/o Pelvis Right  Result Date: 09/13/2018 CLINICAL DATA:  RIGHT total hip anterior approach. EXAM: OPERATIVE RIGHT HIP (WITH PELVIS IF PERFORMED) 6 VIEWS TECHNIQUE: Fluoroscopic spot image(s) were submitted for interpretation post-operatively. COMPARISON:  None. FINDINGS: Six intraoperative fluoroscopic images are provided. Final images show arthroplasty hardware at the RIGHT hip. Hardware appears appropriately positioned. Osseous alignment appears anatomic. Fluoroscopy provided for 23 seconds. IMPRESSION: Intraoperative fluoroscopic images showing arthroplasty hardware at the RIGHT hip. Hardware appears appropriately positioned. No evidence of surgical complicating feature. Electronically Signed   By: Bary RichardStan  Maynard M.D.   On: 09/13/2018 12:53   Dg Hip Unilat W Or Wo Pelvis 2-3 Views  Right  Result Date: 08/24/2018 CLINICAL DATA:  Fall, right hip pain EXAM: DG HIP (WITH OR WITHOUT PELVIS) 2-3V RIGHT COMPARISON:  None. FINDINGS: No fracture or dislocation is seen. Mild to moderate degenerative changes of the right hip. Left hip joint space is preserved. Visualized bony pelvis appears intact. IMPRESSION: No fracture or dislocation is seen. Mild to moderate degenerative changes of the right hip. Electronically Signed   By: Charline BillsSriyesh  Krishnan M.D.   On: 08/24/2018 03:11    Disposition:   Discharge Instructions    Call MD / Call 911   Complete by: As directed    If you experience chest pain or shortness of breath, CALL 911 and be transported to the hospital emergency room.  If you develope a fever above 101.5 F, pus (white drainage) or increased drainage or redness at the wound, or calf pain, call your surgeon's office.   Constipation Prevention   Complete by: As directed    Drink plenty of fluids.  Prune juice may be helpful.  You may use a stool softener, such as Colace (over the counter) 100 mg twice a day.  Use MiraLax (over the counter) for constipation as needed.   Driving restrictions   Complete by: As directed    No driving while taking narcotic pain meds.   Increase activity slowly as tolerated   Complete by: As directed       Follow-up Information    Tarry KosXu, Kianah Harries M, MD In 2 weeks.   Specialty: Orthopedic Surgery Why: For suture removal, For wound re-check Contact information: [address] [place] [address] [phone]        [facility], [facility] Follow up.   Contact information: [address] [place] [address] [phone]        [facility] Follow [facility].   Specialty: Home Health Services Contact information: [address] [place] [address] [phone]            Signed: Glee ArvinMichael Sugey Trevathan 09/14/2018, 9:17 PM

## 2018-09-14 NOTE — Progress Notes (Signed)
Physical Therapy Treatment Patient Details Name: Brenda [name] MRN: [id number] DOB: 04-25-1970 Today's Date: 09/14/2018    History of Present Illness Pt is a 48 year old female presenting s/p R THA direct anterior approach. Relevant PMH includes anxiety, depression, bipolar disorder, asthma, chronic hepatits C, GERD, and HTN.    PT Comments    Pt performed gt training and functional mobility during session this am.  She is progressing well and will continue on track to return home.  Will plan for f/u therapy to return home.    Follow Up Recommendations  Follow surgeon's recommendation for DC plan and follow-up therapies     Equipment Recommendations  Rolling walker with 5" wheels;3in1 (PT)    Recommendations for Other Services       Precautions / Restrictions Precautions Precautions: Fall Restrictions Weight Bearing Restrictions: Yes RLE Weight Bearing: Weight bearing as tolerated    Mobility  Bed Mobility Overal bed mobility: Needs Assistance Bed Mobility: Supine to Sit     Supine to sit: Supervision     General bed mobility comments: Pt used UEs to progress LEs to edge of bed.  Pt slow and guarded to edge of bed.  Transfers Overall transfer level: Needs assistance Equipment used: Rolling walker (2 wheeled) Transfers: Sit to/from Stand Sit to Stand: Min assist         General transfer comment: Pt requires lift assistance. VCs for hand placement on walker necessary for STS transfer.  Ambulation/Gait Ambulation/Gait assistance: Min guard Gait Distance (Feet): 140 Feet Assistive device: Rolling walker (2 wheeled) Gait Pattern/deviations: Step-to pattern;Decreased stance time - right;Decreased step length - left;Antalgic;Step-through pattern     General Gait Details: Cues for knee flexion in swing phase and R toe off to avoid circumduction.  Cues for RW safety.   Stairs             Wheelchair Mobility    Modified Rankin (Stroke Patients  Only)       Balance Overall balance assessment: Needs assistance Sitting-balance support: Feet supported;No upper extremity supported Sitting balance-Leahy Scale: Fair     Standing balance support: Bilateral upper extremity supported Standing balance-Leahy Scale: Poor                              Cognition Arousal/Alertness: Awake/alert Behavior During Therapy: WFL for tasks assessed/performed Overall Cognitive Status: Within Functional Limits for tasks assessed                                        Exercises General Exercises - Lower Extremity Ankle Circles/Pumps: AROM;Both;20 reps;Supine Quad Sets: AROM;Right;10 reps;Supine Heel Slides: AAROM;Right;10 reps;Supine Hip ABduction/ADduction: AAROM;Right;10 reps;Supine    General Comments        Pertinent Vitals/Pain Pain Assessment: 0-10 Pain Score: 6  Pain Location: R hip Pain Descriptors / Indicators: Burning;Constant;Sharp Pain Intervention(s): Monitored during session;Repositioned;Ice applied    Home Living Family/patient expects to be discharged to:: Private residence Living Arrangements: Children;Spouse/significant other                  Prior Function            PT Goals (current goals can now be found in the care plan section) Acute Rehab PT Goals Patient Stated Goal: walk without pain Potential to Achieve Goals: Good Progress towards PT goals: Progressing toward goals    Frequency  7X/week      PT Plan Current plan remains appropriate    Co-evaluation              AM-PAC PT "6 Clicks" Mobility   Outcome Measure  Help needed turning from your back to your side while in a flat bed without using bedrails?: A Little Help needed moving from lying on your back to sitting on the side of a flat bed without using bedrails?: A Little Help needed moving to and from a bed to a chair (including a wheelchair)?: A Little Help needed standing up from a chair  using your arms (e.g., wheelchair or bedside chair)?: A Little Help needed to walk in hospital room?: A Little Help needed climbing 3-5 steps with a railing? : A Little 6 Click Score: 18    End of Session Equipment Utilized During Treatment: Gait belt Activity Tolerance: Patient limited by pain Patient left: in chair;with call bell/phone within reach Nurse Communication: Mobility status PT Visit Diagnosis: Unsteadiness on feet (R26.81);Other abnormalities of gait and mobility (R26.89);Muscle weakness (generalized) (M62.81);Difficulty in walking, not elsewhere classified (R26.2);Pain Pain - Right/Left: Right Pain - part of body: Hip     Time: [id number] PT Time Calculation (min) (ACUTE ONLY): 26 min  Charges:  $Gait Training: 8-22 mins $Therapeutic Exercise: 8-22 mins                     Joycelyn RuaAimee Khadeem Rockett, PTA Acute Rehabilitation Services Pager [phone] Office [phone]     Lateshia Schmoker Artis DelayJ Everlynn Sagun 09/14/2018, 11:34 AM

## 2018-09-14 NOTE — Progress Notes (Signed)
DME- 3-in-1 BSC and RW delivered to room, Pt discharged home accompanied by son.

## 2018-09-14 NOTE — Progress Notes (Signed)
Provided discharge education/instructions, all questions and concerns addressed, Pt not in distress, to discharge home with belongings upon delivery of DME to Pt's room.

## 2018-09-14 NOTE — Op Note (Addendum)
RIGHT TOTAL HIP ARTHROPLASTY ANTERIOR APPROACH  Procedure Note Ethel RanaJennifer C Kawabata   [id number]  Pre-op Diagnosis: right hip degenerative joint disease     Post-op Diagnosis: same   Operative Procedures  1. Total hip replacement; Right hip; uncemented [id number]   Personnel  Surgeon(s): Tarry KosXu, Naiping M, MD  Assist: April Green, RNFA   Anesthesia: spinal  Prosthesis: Depuy Acetabulum: Pinnacle 52 mm Femur: Actis HO 1 Head: 36 mm size: +1.5 Liner: +4 neutral Bearing Type: ceramic on poly  Total Hip Arthroplasty (Anterior Approach) Op Note:  After informed consent was obtained and the operative extremity marked in the holding area, the patient was brought back to the operating room and placed supine on the HANA table. Next, the operative extremity was prepped and draped in normal sterile fashion. Surgical timeout occurred verifying patient identification, surgical site, surgical procedure and administration of antibiotics.  A modified anterior Smith-Peterson approach to the hip was performed, using the interval between tensor fascia lata and sartorius.  Dissection was carried bluntly down onto the anterior hip capsule. The lateral femoral circumflex vessels were identified and coagulated. A capsulotomy was performed and the capsular flaps tagged for later repair.  Fluoroscopy was utilized to prepare for the femoral neck cut. The neck osteotomy was performed. The femoral head was removed, the acetabular rim was cleared of soft tissue and attention was turned to reaming the acetabulum.  Sequential reaming was performed under fluoroscopic guidance. We reamed to a size 51 mm, and then impacted the acetabular shell. A single cancellous screw was placed for added fixation.  The liner was then placed after irrigation and attention turned to the femur.  After placing the femoral hook, the leg was taken to externally rotated, extended and adducted position taking care to perform soft tissue releases  to allow for adequate mobilization of the femur. Soft tissue was cleared from the shoulder of the greater trochanter and the hook elevator used to improve exposure of the proximal femur. Sequential broaching performed up to a size 1. Trial neck and head were placed. The leg was brought back up to neutral and the construct reduced. The position and sizing of components, offset and leg lengths were checked using fluoroscopy. Stability of the construct was checked in extension and external rotation without any subluxation or impingement of prosthesis. We dislocated the prosthesis, dropped the leg back into position, removed trial components, and irrigated copiously. The final stem and head was then placed, the leg brought back up, the system reduced and fluoroscopy used to verify positioning.  We irrigated, obtained hemostasis and closed the capsule using #2 ethibond suture.  One gram of vancomycin powder was placed in the surgical bed. The fascia was closed with #1 vicryl plus, the deep fat layer was closed with 0 vicryl, the subcutaneous layers closed with 2.0 Vicryl Plus and the skin closed with 2.0 nylon and dermabond. A sterile dressing was applied. The patient was awakened in the operating room and taken to recovery in stable condition.  All sponge, needle, and instrument counts were correct at the end of the case.   Position: supine  Complications: see description of procedure.  Time Out: performed   Drains/Packing: none  Estimated blood loss: see anesthesia record  Returned to Recovery Room: in good condition.   Antibiotics: yes   Mechanical VTE (DVT) Prophylaxis: sequential compression devices, TED thigh-high  Chemical VTE (DVT) Prophylaxis: aspirin   Fluid Replacement: see anesthesia record  Specimens Removed: 1 to pathology   Sponge  and Instrument Count Correct? yes   PACU: portable radiograph - low AP   Plan/RTC: Return in 2 weeks for staple removal. Weight Bearing/Load Lower  Extremity: full  Hip precautions: none Suture Removal: 2 weeks   N. Eduard Roux, MD [facility] 9:14 PM   Implant Name Type Inv. Item Serial No. Manufacturer Lot No. LRB No. Used Action  SCREW 6.5MMX25MM - [id number] Screw SCREW 6.5MMX25MM  DEPUY SYNTHES [id number] Right 1 Implanted  LINER NEUTRAL 52X36MM PLUS 4 - [id number] Liner LINER NEUTRAL 52X36MM PLUS 4  DEPUY SYNTHES [id number] Right 1 Implanted  PIN SECTOR W/GRIP ACE CUP 52MM - [id number] Hips PIN SECTOR W/GRIP ACE CUP 52MM  DEPUY SYNTHES [id number] Right 1 Implanted  STEM FEM ACTIS HIGH SZ1 - [id number] Stem STEM FEM ACTIS HIGH SZ1  DEPUY SYNTHES [id number] Right 1 Implanted  HEAD CERAMIC DELTA 36 PLUS 1.5 - [id number] Hips HEAD CERAMIC DELTA 36 PLUS 1.5  DEPUY SYNTHES [id number] Right 1 Implanted

## 2018-09-14 NOTE — Progress Notes (Signed)
   Subjective:  Patient reports pain as controlled. Did PT yesterday afternoon.  Objective:   VITALS:   Vitals:   09/13/18 1535 09/13/18 1942 09/13/18 2355 09/14/18 0431  BP: 128/88 127/90 120/84 137/85  Pulse: 91 85 91 79  Resp: 16 17 16 16   Temp: 98.6 F (37 C) 98.7 F (37.1 C) 98.9 F (37.2 C) 98.6 F (37 C)  TempSrc: Oral Oral Oral Oral  SpO2: 94% 94% (!) 88% 100%    Neurologically intact Neurovascular intact Sensation intact distally Intact pulses distally Dorsiflexion/Plantar flexion intact Incision: dressing C/D/I and no drainage   Lab Results  Component Value Date   WBC 9.1 09/14/2018   HGB 10.3 (L) 09/14/2018   HCT 34.3 (L) 09/14/2018   MCV 72.7 (L) 09/14/2018   PLT 272 09/14/2018     Assessment/Plan:  1 Day Post-Op   - Expected postop acute blood loss anemia - will monitor for symptoms - Up with PT/OT - DVT ppx - SCDs, ambulation, aspirin - WBAT operative extremity - Pain control - Discharge planning - may d/c home today if progresses well through PT  Eduard Roux 09/14/2018, 7:42 AM [phone]

## 2018-09-14 NOTE — Telephone Encounter (Signed)
Noted  

## 2018-09-14 NOTE — Telephone Encounter (Signed)
done

## 2018-09-14 NOTE — Progress Notes (Signed)
Responded to page for spiritual care consult. Nurse stated pt was in tears about her family back home and wanted to be present with them today. Pt was alert and using a walker to get around. Pt was in tears and stated she was about to go home. I offered pt spiritual care with ministry of presence, words of comfort, empathic listening and prayer. Pt was very thankful for the Chaplain visit and staff care.  Chaplain Fidel Levy [phone]

## 2018-09-14 NOTE — Addendum Note (Signed)
Addended by: Azucena Cecil on: 09/14/2018 02:35 PM   Modules accepted: Orders

## 2018-09-15 ENCOUNTER — Telehealth: Payer: Self-pay

## 2018-09-15 NOTE — Telephone Encounter (Signed)
Negative COVID results given. Patient results "NOT Detected." Caller expressed understanding. ° °

## 2018-09-17 ENCOUNTER — Telehealth: Payer: Self-pay | Admitting: Orthopaedic Surgery

## 2018-09-17 ENCOUNTER — Other Ambulatory Visit: Payer: Self-pay | Admitting: Orthopaedic Surgery

## 2018-09-17 NOTE — Telephone Encounter (Signed)
Please advise 

## 2018-09-17 NOTE — Telephone Encounter (Signed)
Pt called in said she needs a refill on her oxycodone. Please have that sent to [facility]   [phone]

## 2018-09-20 MED ORDER — OXYCODONE HCL 5 MG PO TABS: ORAL_TABLET | ORAL | 0 refills | Status: DC

## 2018-09-20 NOTE — Telephone Encounter (Signed)
Sent in oxycodone.  Not refilling oxycontin

## 2018-09-20 NOTE — Telephone Encounter (Signed)
Sent in

## 2018-09-20 NOTE — Telephone Encounter (Signed)
I called patient and advised that per patient med request sent from pharmacy, Oxycodone was renewed but not Oxycontin. She expressed understanding.

## 2018-09-22 ENCOUNTER — Ambulatory Visit: Payer: Medicaid Other | Admitting: Gerontology

## 2018-09-22 ENCOUNTER — Other Ambulatory Visit: Payer: Self-pay

## 2018-09-22 ENCOUNTER — Encounter: Payer: Self-pay | Admitting: Gerontology

## 2018-09-22 ENCOUNTER — Telehealth: Payer: Self-pay | Admitting: Orthopaedic Surgery

## 2018-09-22 VITALS — BP 146/88 | HR 109 | Wt 200.0 lb

## 2018-09-22 DIAGNOSIS — Z8709 Personal history of other diseases of the respiratory system: Secondary | ICD-10-CM

## 2018-09-22 DIAGNOSIS — R9431 Abnormal electrocardiogram [ECG] [EKG]: Secondary | ICD-10-CM | POA: Insufficient documentation

## 2018-09-22 DIAGNOSIS — M25551 Pain in right hip: Secondary | ICD-10-CM

## 2018-09-22 MED ORDER — FLUTICASONE-SALMETEROL 100-50 MCG/DOSE IN AEPB: 1.0000 | INHALATION_SPRAY | Freq: Two times a day (BID) | RESPIRATORY_TRACT | 2 refills | Status: DC

## 2018-09-22 MED ORDER — DILTIAZEM HCL ER COATED BEADS 120 MG PO CP24: 120.0000 mg | ORAL_CAPSULE | Freq: Every day | ORAL | 0 refills | Status: DC

## 2018-09-22 NOTE — Telephone Encounter (Signed)
Message sent in error

## 2018-09-22 NOTE — Progress Notes (Signed)
Established Patient Office Visit  Subjective:  Patient ID: Brenda [name], female    DOB: [birth year]  Age: 48 y.o. MRN: [id number]  CC: No chief complaint on file. Patient consents to telephone visit and 2 patient identifiers was used to identify patient.  HPI Brenda [name] presents for follow up post hospital discharge after total right hip arthroplasty on 09/13/2018. Currently, she states that she fell on 09/20/18. She states that her hip gave out when she got to the top of her steps and she fell to her bottom. She endorses experiencing constant sharp pain to her right hip. She denies muscle weakness or paresthesia, and ambulates with a walker.  She also reports that she continues to experience intermittent shortness of breath when walking around the house. She has a history of asthma, states that she was  on oxygen via nasal canula when hospitalized and was told per patient that she needs to be on home oxygen. She uses her albuterol inhaler, but she denies wheezing, chest tightness and cough. She continues to smoke 1 pack of cigarette daily and admits the desire to quit.  She states that she has not taking Diltiazem 120 mg daily for couple of weeks because she has no  More refills. She was started on Diltiazem 120 mg during her hospitalization in April due to abnormal EKG done on 05/22/18. She reports that her heart rate has being greater than 100 bpm, and it ranges between 107-127 since not taking Diltiazem. Her blood pressure during telephone visit was 146/88 and HR 109. She endorses 2 episodes of intermittent non radiating left chest pain last week, which resolves within 1 hour when she rested. She denies associated symptoms with chest pain. She reports that she will follow up with Cardiologist 10/11/18. She states that she's doing well and denies further concern.  Past Medical History:  Diagnosis Date  . Ankle fracture, left    in past.  . Anxiety   . Arthritis    Right hip,  scheduled for replacement 7/13/58m, left ankle  . Asthma   . Bipolar disorder ([place])   . Chronic hepatitis C ([place])   . Depression   . Dyspnea    occasional with exertion  . GERD (gastroesophageal reflux disease)    pmh  . Headache   . Hearing loss    some loss in both ears - no dx - per patient "runs in family", no hearing aids  . Hypertension   . Lump in female breast    right  . Pneumonia    x 1  . Polysubstance abuse ([place])   . Primary localized osteoarthritis of hip    right  . SVD (spontaneous vaginal delivery)    x 3  . Uses roller walker    occasional  . Wears dentures    full upper    Past Surgical History:  Procedure Laterality Date  . TOTAL HIP ARTHROPLASTY Right 09/13/2018  . TOTAL HIP ARTHROPLASTY Right 09/13/2018   Procedure: RIGHT TOTAL HIP ARTHROPLASTY ANTERIOR APPROACH;  Surgeon: Leandrew Koyanagi, MD;  Location: [facility];  Service: Orthopedics;  Laterality: Right;  . TUBAL LIGATION      No family history on file.  Social History   Socioeconomic History  . Marital status: Single    Spouse name: Not on file  . Number of children: Not on file  . Years of education: Not on file  . Highest education level: Not on file  Occupational History  . Not  on file  Social Needs  . Financial resource strain: Not on file  . Food insecurity    Worry: Not on file    Inability: Not on file  . Transportation needs    Medical: Not on file    Non-medical: Not on file  Tobacco Use  . Smoking status: Current Every Day Smoker    Packs/day: 1.00    Years: 31.00    Pack years: 31.00    Types: Cigarettes  . Smokeless tobacco: Never Used  . Tobacco comment: since age 48  Substance and Sexual Activity  . Alcohol use: No  . Drug use: Yes    Frequency: 7.0 times per week    Types: Cocaine, Heroin    Comment: uses heroin for pain 09/03/18. Last cocaine 08/02/18  . Sexual activity: Yes    Birth control/protection: Surgical  Lifestyle  . Physical activity    Days per week:  Not on file    Minutes per session: Not on file  . Stress: Not on file  Relationships  . Social Musicianconnections    Talks on phone: Not on file    Gets together: Not on file    Attends religious service: Not on file    Active member of club or organization: Not on file    Attends meetings of clubs or organizations: Not on file    Relationship status: Not on file  . Intimate partner violence    Fear of current or ex partner: Not on file    Emotionally abused: Not on file    Physically abused: Not on file    Forced sexual activity: Not on file  Other Topics Concern  . Not on file  Social History Narrative  . Not on file    Outpatient Medications Prior to Visit  Medication Sig Dispense Refill  . aspirin EC 81 MG tablet Take 1 tablet (81 mg total) by mouth 2 (two) times daily. 84 tablet 0  . ferrous sulfate 325 (65 FE) MG tablet Take 1 tablet (325 mg total) by mouth daily with breakfast. 30 tablet 3  . methocarbamol (ROBAXIN) 750 MG tablet Take 1 tablet (750 mg total) by mouth 2 (two) times daily as needed for muscle spasms. 60 tablet 0  . oxyCODONE (OXY IR/ROXICODONE) 5 MG immediate release tablet Take 1-2 tablets (5-10 mg total) by mouth every 8 (eight) hours as needed for severe pain. 30 tablet 0  . oxyCODONE (ROXICODONE) 5 MG immediate release tablet Take 1-2 tabs po tid prn pain 30 tablet 0  . promethazine (PHENERGAN) 25 MG tablet Take 1 tablet (25 mg total) by mouth every 6 (six) hours as needed for nausea. 30 tablet 1  . senna-docusate (SENOKOT S) 8.6-50 MG tablet Take 1-2 tablets by mouth at bedtime as needed. 30 tablet 1  . ibuprofen (ADVIL) 800 MG tablet Take 1 tablet (800 mg total) by mouth every 8 (eight) hours as needed for moderate pain. 30 tablet 0  . oxyCODONE-acetaminophen (PERCOCET) 5-325 MG tablet Take 1-2 tablets by mouth every 8 (eight) hours as needed for severe pain. (Patient not taking: Reported on 09/22/2018) 42 tablet 0   No facility-administered medications prior to  visit.     Allergies  Allergen Reactions  . Flexeril [Cyclobenzaprine] Other (See Comments)    RLS-like symptoms   . Seroquel [Quetiapine Fumarate] Other (See Comments)    RLS-like symptoms    ROS Review of Systems  Constitutional: Negative.   Respiratory: Positive for shortness of breath. Negative  for cough, chest tightness and wheezing.   Cardiovascular: Negative.   Gastrointestinal: Negative.   Musculoskeletal: Positive for arthralgias (right hip atroplasty).  Neurological: Negative.   Psychiatric/Behavioral: Negative.       Objective:    Physical Exam No vital sign or PE done BP (!) 146/88   Pulse (!) 109 Comment: Patient report  Wt 200 lb (90.7 kg) Comment: pt stated  LMP  (LMP Unknown)   BMI 36.58 kg/m  Wt Readings from Last 3 Encounters:  09/22/18 200 lb (90.7 kg)  09/14/18 209 lb (94.8 kg)  09/06/18 209 lb (94.8 kg)     Health Maintenance Due  Topic Date Due  . PAP SMEAR-Modifier  02/08/1993  . INFLUENZA VACCINE  08/28/2018    There are no preventive care reminders to display for this patient.  Lab Results  Component Value Date   TSH 1.192 04/30/2018   Lab Results  Component Value Date   WBC 9.1 09/14/2018   HGB 10.3 (L) 09/14/2018   HCT 34.3 (L) 09/14/2018   MCV 72.7 (L) 09/14/2018   PLT 272 09/14/2018   Lab Results  Component Value Date   NA 136 09/14/2018   K 4.0 09/14/2018   CO2 25 09/14/2018   GLUCOSE 99 09/14/2018   BUN 11 09/14/2018   CREATININE 0.92 09/14/2018   BILITOT <0.1 (L) 09/03/2018   ALKPHOS 119 09/03/2018   AST 76 (H) 09/03/2018   ALT 89 (H) 09/03/2018   PROT 6.5 09/03/2018   ALBUMIN 3.2 (L) 09/03/2018   CALCIUM 8.2 (L) 09/14/2018   ANIONGAP 8 09/14/2018   Lab Results  Component Value Date   CHOL 163 07/07/2018   Lab Results  Component Value Date   HDL 48 07/07/2018   Lab Results  Component Value Date   LDLCALC 83 07/07/2018   Lab Results  Component Value Date   TRIG 159 (H) 07/07/2018   Lab Results   Component Value Date   CHOLHDL 3.4 07/07/2018   Lab Results  Component Value Date   HGBA1C 5.1 05/02/2016      Assessment & Plan:     1. Abnormal EKG - She was advised to continue on current treatment and to follow up with Cardiologist on 10/11/2018. She was advised to go to the ED with worsening symptoms. - diltiazem (DILTIAZEM CD) 120 MG 24 hr capsule; Take 1 capsule (120 mg total) by mouth daily.  Dispense: 20 capsule; Refill: 0  2. History of asthma - She will continue ADVAIR, was advised on side effect and to notify clinic. - She was strongly advised on smoking cessation and was provided the [place] Quitline number. - Fluticasone-Salmeterol (ADVAIR) 100-50 MCG/DOSE AEPB; Inhale 1 puff into the lungs 2 (two) times daily.  Dispense: 60 each; Refill: 2  3. Right hip pain - She was advised to continue on her pain regimen and notify Orthopedic clinic about her fall and right hip pain.   Follow-up: Return in about 29 days (around 10/21/2018), or if symptoms worsen or fail to improve.    Shavona Gunderman Trellis Paganini, NP

## 2018-09-24 ENCOUNTER — Ambulatory Visit (INDEPENDENT_AMBULATORY_CARE_PROVIDER_SITE_OTHER): Payer: Self-pay

## 2018-09-24 ENCOUNTER — Encounter: Payer: Self-pay | Admitting: Orthopaedic Surgery

## 2018-09-24 ENCOUNTER — Ambulatory Visit (INDEPENDENT_AMBULATORY_CARE_PROVIDER_SITE_OTHER): Payer: Self-pay | Admitting: Orthopaedic Surgery

## 2018-09-24 VITALS — Ht 62.0 in | Wt 201.0 lb

## 2018-09-24 DIAGNOSIS — Z96641 Presence of right artificial hip joint: Secondary | ICD-10-CM

## 2018-09-24 DIAGNOSIS — M25551 Pain in right hip: Secondary | ICD-10-CM

## 2018-09-24 MED ORDER — OXYCODONE HCL 5 MG PO TABS: 5.0000 mg | ORAL_TABLET | Freq: Two times a day (BID) | ORAL | 0 refills | Status: DC | PRN

## 2018-09-24 MED ORDER — IBUPROFEN 800 MG PO TABS: 800.0000 mg | ORAL_TABLET | Freq: Three times a day (TID) | ORAL | 2 refills | Status: DC | PRN

## 2018-09-24 NOTE — Progress Notes (Signed)
Post-Op Visit Note   Patient: Brenda [name]           Date of Birth: May 20, 1970           MRN: [id number] Visit Date: 09/24/2018 PCP: System, Pcp Not In   Assessment & Plan:  Chief Complaint:  Chief Complaint  Patient presents with  . Right Hip - Routine Post Op    Right THA DOS 09/13/2018   Visit Diagnoses:  1. Status post right hip replacement   2. Pain in right hip     Plan: Brenda [name] is 11 days status post right total hip replacement.  She states that she had a fell 4 days ago and landed on her right hip when she is going up the stairs.  She denies torquing her leg or hip in any sort of weird weight.  She states that she is having increased right hip pain.  She is able to ambulate with a walker.  She states that she felt something give in her right hip which caused her to fall.  Her x-rays demonstrate no acute abnormalities.  I do not feel any instability of her right hip when I test her range of motion.  Her incision is clean dry and intact.  I replaced her bandage today.  She likely had a muscle spasm that caused her to fall.  Fortunately there is no issues with the implant.  Her back next week as scheduled for suture removal.  Follow-Up Instructions: Return for as scheduled.   Orders:  Orders Placed This Encounter  Procedures  . XR HIP UNILAT W OR W/O PELVIS 2-3 VIEWS RIGHT   Meds ordered this encounter  Medications  . oxyCODONE (OXY IR/ROXICODONE) 5 MG immediate release tablet    Sig: Take 1-2 tablets (5-10 mg total) by mouth 2 (two) times daily as needed for severe pain.    Dispense:  30 tablet    Refill:  0  . ibuprofen (ADVIL) 800 MG tablet    Sig: Take 1 tablet (800 mg total) by mouth every 8 (eight) hours as needed.    Dispense:  30 tablet    Refill:  2    Imaging: Xr Hip Unilat W Or W/o Pelvis 2-3 Views Right  Result Date: 09/24/2018 No acute abnormalities.   PMFS History: Patient Active Problem List   Diagnosis Date Noted  . Abnormal EKG  09/22/2018  . History of asthma 09/22/2018  . Right hip pain 09/22/2018  . Status post right hip replacement 09/13/2018  . Leg edema 08/22/2018  . Primary osteoarthritis of right hip 07/27/2018  . Vaginal discharge 07/14/2018  . Sepsis (HCC) 04/30/2018  . Opioid use disorder, moderate, dependence (HCC) 05/02/2016  . Tobacco use disorder 05/02/2016  . Severe recurrent major depression without psychotic features (HCC) 05/01/2016  . Cocaine use disorder, moderate, dependence (HCC) 05/01/2016  . Alcohol abuse 05/01/2016  . GERD (gastroesophageal reflux disease) 05/01/2016   Past Medical History:  Diagnosis Date  . Ankle fracture, left    in past.  . Anxiety   . Arthritis    Right hip, scheduled for replacement 7/13/48m, left ankle  . Asthma   . Bipolar disorder (HCC)   . Chronic hepatitis C (HCC)   . Depression   . Dyspnea    occasional with exertion  . GERD (gastroesophageal reflux disease)    [facility]  . Headache   . Hearing loss    some loss in both ears - no dx - per patient "runs  in family", no hearing aids  . Hypertension   . Lump in female breast    right  . Pneumonia    x 1  . Polysubstance abuse ([place])   . Primary localized osteoarthritis of hip    right  . SVD (spontaneous vaginal delivery)    x 3  . Uses roller walker    occasional  . Wears dentures    full upper    History reviewed. No pertinent family history.  Past Surgical History:  Procedure Laterality Date  . TOTAL HIP ARTHROPLASTY Right 09/13/2018  . TOTAL HIP ARTHROPLASTY Right 09/13/2018   Procedure: RIGHT TOTAL HIP ARTHROPLASTY ANTERIOR APPROACH;  Surgeon: Leandrew Koyanagi, MD;  Location: [facility];  Service: Orthopedics;  Laterality: Right;  . TUBAL LIGATION     Social History   Occupational History  . Not on file  Tobacco Use  . Smoking status: Current Every Day Smoker    Packs/day: 1.00    Years: 31.00    Pack years: 31.00    Types: Cigarettes  . Smokeless tobacco: Never Used  . Tobacco  comment: since age 6  Substance and Sexual Activity  . Alcohol use: No  . Drug use: Yes    Frequency: 7.0 times per week    Types: Cocaine, Heroin    Comment: uses heroin for pain 09/03/18. Last cocaine 08/02/18  . Sexual activity: Yes    Birth control/protection: Surgical

## 2018-09-28 ENCOUNTER — Encounter: Payer: Self-pay | Admitting: Orthopaedic Surgery

## 2018-09-28 ENCOUNTER — Ambulatory Visit (INDEPENDENT_AMBULATORY_CARE_PROVIDER_SITE_OTHER): Payer: Self-pay | Admitting: Orthopaedic Surgery

## 2018-09-28 DIAGNOSIS — Z96641 Presence of right artificial hip joint: Secondary | ICD-10-CM

## 2018-09-28 NOTE — Progress Notes (Signed)
Post-Op Visit Note   Patient: Brenda [name]           Date of Birth: 1970-05-16           MRN: [id number] Visit Date: 09/28/2018 PCP: System, Pcp Not In   Assessment & Plan:  Chief Complaint:  Chief Complaint  Patient presents with  . Right Hip - Routine Post Op   Visit Diagnoses:  1. Status post right hip replacement     Plan: Brenda [name] is two-week status post right total hip replacement.  She is doing well overall.  Not really needing pain medicines.  Surgical incisions well-healed.  Sutures were removed today.  She will continue with home exercise program for progressive hip strengthening.  Continue with aspirin for DVT prophylaxis.  Recheck in 4 weeks with standing AP pelvis and lateral hip.  Follow-Up Instructions: Return in about 4 weeks (around 10/26/2018).   Orders:  No orders of the defined types were placed in this encounter.  No orders of the defined types were placed in this encounter.   Imaging: No results found.  PMFS History: Patient Active Problem List   Diagnosis Date Noted  . Abnormal EKG 09/22/2018  . History of asthma 09/22/2018  . Right hip pain 09/22/2018  . Status post right hip replacement 09/13/2018  . Leg edema 08/22/2018  . Primary osteoarthritis of right hip 07/27/2018  . Vaginal discharge 07/14/2018  . Sepsis ([place]) 04/30/2018  . Opioid use disorder, moderate, dependence ([place]) 05/02/2016  . Tobacco use disorder 05/02/2016  . Severe recurrent major depression without psychotic features ([place]) 05/01/2016  . Cocaine use disorder, moderate, dependence ([place]) 05/01/2016  . Alcohol abuse 05/01/2016  . GERD (gastroesophageal reflux disease) 05/01/2016   Past Medical History:  Diagnosis Date  . Ankle fracture, left    in past.  . Anxiety   . Arthritis    Right hip, scheduled for replacement 7/13/44m, left ankle  . Asthma   . Bipolar disorder ([place])   . Chronic hepatitis C ([place])   . Depression   . Dyspnea    occasional with exertion   . GERD (gastroesophageal reflux disease)    [facility]  . Headache   . Hearing loss    some loss in both ears - no dx - per patient "runs in family", no hearing aids  . Hypertension   . Lump in female breast    right  . Pneumonia    x 1  . Polysubstance abuse ([place])   . Primary localized osteoarthritis of hip    right  . SVD (spontaneous vaginal delivery)    x 3  . Uses roller walker    occasional  . Wears dentures    full upper    No family history on file.  Past Surgical History:  Procedure Laterality Date  . TOTAL HIP ARTHROPLASTY Right 09/13/2018  . TOTAL HIP ARTHROPLASTY Right 09/13/2018   Procedure: RIGHT TOTAL HIP ARTHROPLASTY ANTERIOR APPROACH;  Surgeon: Leandrew Koyanagi, MD;  Location: [facility];  Service: Orthopedics;  Laterality: Right;  . TUBAL LIGATION     Social History   Occupational History  . Not on file  Tobacco Use  . Smoking status: Current Every Day Smoker    Packs/day: 1.00    Years: 31.00    Pack years: 31.00    Types: Cigarettes  . Smokeless tobacco: Never Used  . Tobacco comment: since age 70  Substance and Sexual Activity  . Alcohol use: No  . Drug use:  Yes    Frequency: 7.0 times per week    Types: Cocaine, Heroin    Comment: uses heroin for pain 09/03/18. Last cocaine 08/02/18  . Sexual activity: Yes    Birth control/protection: Surgical

## 2018-09-29 ENCOUNTER — Ambulatory Visit (INDEPENDENT_AMBULATORY_CARE_PROVIDER_SITE_OTHER): Payer: Self-pay | Admitting: Obstetrics and Gynecology

## 2018-09-29 ENCOUNTER — Other Ambulatory Visit: Payer: Self-pay

## 2018-09-29 ENCOUNTER — Encounter: Payer: Self-pay | Admitting: Obstetrics and Gynecology

## 2018-09-29 VITALS — BP 130/81 | HR 91 | Ht 62.0 in | Wt 211.2 lb

## 2018-09-29 DIAGNOSIS — R3915 Urgency of urination: Secondary | ICD-10-CM

## 2018-09-29 DIAGNOSIS — N6310 Unspecified lump in the right breast, unspecified quadrant: Secondary | ICD-10-CM

## 2018-09-29 DIAGNOSIS — R350 Frequency of micturition: Secondary | ICD-10-CM

## 2018-09-29 LAB — POCT URINALYSIS DIPSTICK
Bilirubin, UA: NEGATIVE
Glucose, UA: NEGATIVE
Ketones, UA: NEGATIVE
Nitrite, UA: NEGATIVE
Protein, UA: POSITIVE — AB
Spec Grav, UA: 1.025 (ref 1.010–1.025)
Urobilinogen, UA: 0.2 E.U./dL
pH, UA: 5 (ref 5.0–8.0)

## 2018-09-29 MED ORDER — NITROFURANTOIN MONOHYD MACRO 100 MG PO CAPS: 100.0000 mg | ORAL_CAPSULE | Freq: Two times a day (BID) | ORAL | 1 refills | Status: DC

## 2018-09-29 NOTE — Progress Notes (Signed)
Patient c/o right breast lump that was noted on mammogram and breast ultrasound 2 years ago, denies pain or discomfort. Patient c/o urinary urgency and voiding small amounts x2 days.

## 2018-09-29 NOTE — Progress Notes (Signed)
HPI:      [name] is a 48 y.o. [phone] who LMP was No LMP recorded (lmp unknown). (Menstrual status: Perimenopausal).  Subjective:   She presents today because she has "become concerned".  About a breast mass that was diagnosed by mammography and ultrasound 2 years ago.  She was scheduled for 20-month follow-up but did not return.  She reports that she cannot feel it. She also states that she has urinary frequency and that she gets frequent UTIs. She states that she was using heroin and cocaine approximately 6 months ago but since her hip replacement surgery she has " needed them anymore."     Hx: The following portions of the patient's history were reviewed and updated as appropriate:             She  has a past medical history of Ankle fracture, left, Anxiety, Arthritis, Asthma, Bipolar disorder ([place]), Chronic hepatitis C ([place]), Depression, Dyspnea, GERD (gastroesophageal reflux disease), Headache, Hearing loss, Hypertension, Lump in female breast, Pneumonia, Polysubstance abuse ([place]), Primary localized osteoarthritis of hip, SVD (spontaneous vaginal delivery), Uses roller walker, and Wears dentures. She does not have any pertinent problems on file. She  has a past surgical history that includes Tubal ligation; Total hip arthroplasty (Right, 09/13/2018); and Total hip arthroplasty (Right, 09/13/2018). Her family history includes Breast cancer in her sister. She  reports that she has been smoking cigarettes. She has a 31.00 pack-year smoking history. She has never used smokeless tobacco. She reports current alcohol use. She reports current drug use. Frequency: 7.00 times per week. Drugs: Cocaine and Heroin. She has a current medication list which includes the following prescription(s): aspirin ec, diltiazem, ferrous sulfate, fluticasone-salmeterol, ibuprofen, and oxycodone. She is allergic to flexeril [cyclobenzaprine] and seroquel [quetiapine fumarate].       Review of Systems:   Review of Systems  Constitutional: Denied constitutional symptoms, night sweats, recent illness, fatigue, fever, insomnia and weight loss.  Eyes: Denied eye symptoms, eye pain, photophobia, vision change and visual disturbance.  Ears/Nose/Throat/Neck: Denied ear, nose, throat or neck symptoms, hearing loss, nasal discharge, sinus congestion and sore throat.  Cardiovascular: Denied cardiovascular symptoms, arrhythmia, chest pain/pressure, edema, exercise intolerance, orthopnea and palpitations.  Respiratory: Denied pulmonary symptoms, asthma, pleuritic pain, productive sputum, cough, dyspnea and wheezing.  Gastrointestinal: Denied, gastro-esophageal reflux, melena, nausea and vomiting.  Genitourinary: See HPI for additional information.  Musculoskeletal: Denied musculoskeletal symptoms, stiffness, swelling, muscle weakness and myalgia.  Dermatologic: Denied dermatology symptoms, rash and scar.  Neurologic: Denied neurology symptoms, dizziness, headache, neck pain and syncope.  Psychiatric: Denied psychiatric symptoms, anxiety and depression.  Endocrine: Denied endocrine symptoms including hot flashes and night sweats.   Meds:   Current Outpatient Medications on File Prior to Visit  Medication Sig Dispense Refill  . aspirin EC 81 MG tablet Take 1 tablet (81 mg total) by mouth 2 (two) times daily. 84 tablet 0  . diltiazem (DILTIAZEM CD) 120 MG 24 hr capsule Take 1 capsule (120 mg total) by mouth daily. 20 capsule 0  . ferrous sulfate 325 (65 FE) MG tablet Take 1 tablet (325 mg total) by mouth daily with breakfast. 30 tablet 3  . Fluticasone-Salmeterol (ADVAIR) 100-50 MCG/DOSE AEPB Inhale 1 puff into the lungs 2 (two) times daily. 60 each 2  . ibuprofen (ADVIL) 800 MG tablet Take 1 tablet (800 mg total) by mouth every 8 (eight) hours as needed. 30 tablet 2  . oxyCODONE (OXY IR/ROXICODONE) 5 MG immediate release tablet Take 1-2  tablets (5-10 mg total) by mouth every 8 (eight) hours as needed for  severe pain. 30 tablet 0   No current facility-administered medications on file prior to visit.     Objective:     Vitals:   09/29/18 1435  BP: 130/81  Pulse: 91              Examination of the right breast and axilla regions reveals no masses.  There is no nipple discharge.  There is no skin dimpling.    Assessment:    [id number] Patient Active Problem List   Diagnosis Date Noted  . Abnormal EKG 09/22/2018  . History of asthma 09/22/2018  . Right hip pain 09/22/2018  . Status post right hip replacement 09/13/2018  . Leg edema 08/22/2018  . Primary osteoarthritis of right hip 07/27/2018  . Vaginal discharge 07/14/2018  . Sepsis (HCC) 04/30/2018  . Opioid use disorder, moderate, dependence (HCC) 05/02/2016  . Tobacco use disorder 05/02/2016  . Severe recurrent major depression without psychotic features (HCC) 05/01/2016  . Cocaine use disorder, moderate, dependence (HCC) 05/01/2016  . Alcohol abuse 05/01/2016  . GERD (gastroesophageal reflux disease) 05/01/2016     1. Mass of right breast on mammogram   2. Frequency of urination     Mass cannot be palpated but was found 2 years ago on mammogram/ultrasound at [facility].   Plan:            1.  Patient was asked to leave a urine for us to check for UTI but as of this time has been unable.  2.  Schedule mammogram follow-up of history of right breast mass. Orders Orders Placed This Encounter  Procedures  . MM Digital Diagnostic Unilat R    No orders of the defined types were placed in this encounter.     F/U  Return for We will contact her with any abnormal test results. I spent 22 minutes involved in the care of this patient of which greater than 50% was spent discussing right breast mass, family history, work-up for breast mass, frequency of urination, UTIs, all questions answered.  Elonda Huskyavid J. Declan Adamson, M.D. 09/29/2018 2:59 PM

## 2018-09-29 NOTE — Addendum Note (Signed)
Addended by: Finis Bud on: 09/29/2018 03:19 PM   Modules accepted: Orders

## 2018-09-29 NOTE — Addendum Note (Signed)
Addended by: Edrick Oh J on: 09/29/2018 03:15 PM   Modules accepted: Orders

## 2018-10-01 LAB — URINE CULTURE

## 2018-10-05 ENCOUNTER — Telehealth: Payer: Self-pay | Admitting: Orthopaedic Surgery

## 2018-10-05 ENCOUNTER — Other Ambulatory Visit: Payer: Self-pay | Admitting: Physician Assistant

## 2018-10-05 MED ORDER — HYDROCODONE-ACETAMINOPHEN 5-325 MG PO TABS: 1.0000 | ORAL_TABLET | Freq: Three times a day (TID) | ORAL | 0 refills | Status: DC | PRN

## 2018-10-05 NOTE — Telephone Encounter (Signed)
Pt called in requesting a refill on oxycodone, please have that sent to [facility].   [phone]

## 2018-10-05 NOTE — Telephone Encounter (Signed)
Weaning to norco and I just sent in

## 2018-10-05 NOTE — Telephone Encounter (Signed)
Please advise. Thanks.  

## 2018-10-05 NOTE — Telephone Encounter (Signed)
IC patient and advised.  

## 2018-10-11 ENCOUNTER — Ambulatory Visit: Payer: Medicaid Other | Admitting: Cardiology

## 2018-10-12 ENCOUNTER — Other Ambulatory Visit: Payer: Self-pay | Admitting: Physician Assistant

## 2018-10-12 ENCOUNTER — Telehealth: Payer: Self-pay | Admitting: Orthopaedic Surgery

## 2018-10-12 MED ORDER — HYDROCODONE-ACETAMINOPHEN 5-325 MG PO TABS: 1.0000 | ORAL_TABLET | Freq: Two times a day (BID) | ORAL | 0 refills | Status: DC | PRN

## 2018-10-12 NOTE — Telephone Encounter (Signed)
Just sent in

## 2018-10-12 NOTE — Telephone Encounter (Signed)
Patient called to get refill of Hydrocodone.  Please give patient a call [phone]

## 2018-10-12 NOTE — Telephone Encounter (Signed)
I left voicemail for patient advising. 

## 2018-10-12 NOTE — Telephone Encounter (Signed)
Please advise 

## 2018-10-14 ENCOUNTER — Other Ambulatory Visit: Payer: Self-pay | Admitting: Obstetrics and Gynecology

## 2018-10-14 ENCOUNTER — Encounter: Payer: Medicaid Other | Admitting: Obstetrics and Gynecology

## 2018-10-14 DIAGNOSIS — N6489 Other specified disorders of breast: Secondary | ICD-10-CM

## 2018-10-20 ENCOUNTER — Telehealth: Payer: Self-pay | Admitting: Orthopaedic Surgery

## 2018-10-20 ENCOUNTER — Other Ambulatory Visit: Payer: Self-pay | Admitting: Physician Assistant

## 2018-10-20 MED ORDER — HYDROCODONE-ACETAMINOPHEN 5-325 MG PO TABS: 1.0000 | ORAL_TABLET | Freq: Every day | ORAL | 0 refills | Status: DC | PRN

## 2018-10-20 NOTE — Telephone Encounter (Signed)
Pt is s/p a right total hip on 09/13/18 requesting refill on Hydrocodone. Please advise.

## 2018-10-20 NOTE — Telephone Encounter (Signed)
done

## 2018-10-20 NOTE — Telephone Encounter (Signed)
Patient called requesting prescription refill of Hydrocodone to be sent to [facility] in [place].

## 2018-10-26 ENCOUNTER — Encounter: Payer: Self-pay | Admitting: Gerontology

## 2018-10-26 ENCOUNTER — Other Ambulatory Visit: Payer: Self-pay

## 2018-10-26 ENCOUNTER — Ambulatory Visit: Payer: Medicaid Other | Admitting: Gerontology

## 2018-10-26 VITALS — BP 132/92 | HR 89 | Ht 62.0 in | Wt 213.0 lb

## 2018-10-26 DIAGNOSIS — R9431 Abnormal electrocardiogram [ECG] [EKG]: Secondary | ICD-10-CM

## 2018-10-26 DIAGNOSIS — M549 Dorsalgia, unspecified: Secondary | ICD-10-CM | POA: Insufficient documentation

## 2018-10-26 DIAGNOSIS — M545 Low back pain, unspecified: Secondary | ICD-10-CM

## 2018-10-26 DIAGNOSIS — Z0000 Encounter for general adult medical examination without abnormal findings: Secondary | ICD-10-CM

## 2018-10-26 DIAGNOSIS — R399 Unspecified symptoms and signs involving the genitourinary system: Secondary | ICD-10-CM | POA: Insufficient documentation

## 2018-10-26 DIAGNOSIS — G4730 Sleep apnea, unspecified: Secondary | ICD-10-CM

## 2018-10-26 DIAGNOSIS — M25551 Pain in right hip: Secondary | ICD-10-CM

## 2018-10-26 DIAGNOSIS — Z8619 Personal history of other infectious and parasitic diseases: Secondary | ICD-10-CM

## 2018-10-26 MED ORDER — DILTIAZEM HCL ER COATED BEADS 120 MG PO CP24: 120.0000 mg | ORAL_CAPSULE | Freq: Every day | ORAL | 0 refills | Status: DC

## 2018-10-26 NOTE — Progress Notes (Signed)
Established Patient Office Visit  Subjective:  Patient ID: Brenda [name], female    DOB: 03-19-1970  Age: 48 y.o. MRN: [id number]  CC:  Chief Complaint  Patient presents with  . Hypertension    needs medications refilled    HPI Brenda [name] presents for follow up of Hypertension with Tachycardia, gasping for air while sleeping at night, right hip pain and urinary tract infection symptoms. She reports that her right hip replacement site has completely healed, and pain has decreased tremendously to 3/10. She states that she's being experiencing intermittent non radiating sharp pain to her mid lower back that has being going on since her right hip replacement surgery. She states that the lower back pain intensity increases from 5-8 while standing, but pain is relieved with sitting and taking 1-2 tablets of Hydrocodone- acetaminophen 5-325 mg. She denies urinary or bladder incontinence and saddle anesthesia.   She checks her blood pressure at home and states that it is usually  less than 140/90, but her heart rate is controlled with taking Diltiazem 120 mg daily. She states that sometimes her heart rate could increase up to 110 bpm. She was started on Diltiazem 120 mg during her hospitalization in April due to abnormal EKG done on 05/22/18. She states that she is yet to reschedule an appointment with the Cardiologist.  She endorses that her breathing is stable, though she experiences intermittent shortness of breath with exertion, but she continues to wake up 1-2 times at night gasping for air. She states that while she was admitted in the hospital in April, she was on oxygen. Her oxygen saturation during visit was 97 % on room air. She continues to smoke 1 pack of cigarette daily and admits the desire to quit.  She also reports that she's being experiencing dysuria, urinary frequency and urgency after being treated for UTI with Nitrofurantoin on 09/29/2018. She denies fever, chills, flank pain  and no further concerns.    Past Medical History:  Diagnosis Date  . Ankle fracture, left    in past.  . Anxiety   . Arthritis    Right hip, scheduled for replacement 7/13/62m, left ankle  . Asthma   . Bipolar disorder ([place])   . Chronic hepatitis C ([place])   . Depression   . Dyspnea    occasional with exertion  . GERD (gastroesophageal reflux disease)    pmh  . Headache   . Hearing loss    some loss in both ears - no dx - per patient "runs in family", no hearing aids  . Hypertension   . Lump in female breast    right  . Pneumonia    x 1  . Polysubstance abuse ([place])   . Primary localized osteoarthritis of hip    right  . SVD (spontaneous vaginal delivery)    x 3  . Uses roller walker    occasional  . Wears dentures    full upper    Past Surgical History:  Procedure Laterality Date  . TOTAL HIP ARTHROPLASTY Right 09/13/2018  . TOTAL HIP ARTHROPLASTY Right 09/13/2018   Procedure: RIGHT TOTAL HIP ARTHROPLASTY ANTERIOR APPROACH;  Surgeon: Leandrew Koyanagi, MD;  Location: [place];  Service: Orthopedics;  Laterality: Right;  . TUBAL LIGATION      Family History  Problem Relation Age of Onset  . Breast cancer Sister   . Ovarian cancer Neg Hx   . Colon cancer Neg Hx     Social History  Socioeconomic History  . Marital status: Single    Spouse name: Not on file  . Number of children: Not on file  . Years of education: Not on file  . Highest education level: Not on file  Occupational History  . Not on file  Social Needs  . Financial resource strain: Not on file  . Food insecurity    Worry: Not on file    Inability: Not on file  . Transportation needs    Medical: Not on file    Non-medical: Not on file  Tobacco Use  . Smoking status: Current Every Day Smoker    Packs/day: 1.00    Years: 31.00    Pack years: 31.00    Types: Cigarettes  . Smokeless tobacco: Never Used  . Tobacco comment: since age 48  Substance and Sexual Activity  . Alcohol use: Yes     Comment: rare  . 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 Last cocaine 08/02/18  . Sexual activity: Not Currently    Birth control/protection: Surgical    Comment: tubal   Lifestyle  . Physical activity    Days per week: Not on file    Minutes per session: Not on file  . Stress: Not on file  Relationships  . Social Musician on phone: Not on file    Gets together: Not on file    Attends religious service: Not on file    Active member of club or organization: Not on file    Attends meetings of clubs or organizations: Not on file    Relationship status: Not on file  . Intimate partner violence    Fear of current or ex partner: Not on file    Emotionally abused: Not on file    Physically abused: Not on file    Forced sexual activity: Not on file  Other Topics Concern  . Not on file  Social History Narrative  . Not on file    Outpatient Medications Prior to Visit  Medication Sig Dispense Refill  . aspirin EC 81 MG tablet Take 1 tablet (81 mg total) by mouth 2 (two) times daily. 84 tablet 0  . ferrous sulfate 325 (65 FE) MG tablet Take 1 tablet (325 mg total) by mouth daily with breakfast. 30 tablet 3  . Fluticasone-Salmeterol (ADVAIR) 100-50 MCG/DOSE AEPB Inhale 1 puff into the lungs 2 (two) times daily. 60 each 2  . HYDROcodone-acetaminophen (NORCO) 5-325 MG tablet Take 1-2 tablets by mouth daily as needed for moderate pain. 20 tablet 0  . ibuprofen (ADVIL) 800 MG tablet Take 1 tablet (800 mg total) by mouth every 8 (eight) hours as needed. 30 tablet 2  . diltiazem (DILTIAZEM CD) 120 MG 24 hr capsule Take 1 capsule (120 mg total) by mouth daily. 20 capsule 0  . nitrofurantoin, macrocrystal-monohydrate, (MACROBID) 100 MG capsule Take 1 capsule (100 mg total) by mouth 2 (two) times daily. 14 capsule 1  . oxyCODONE (OXY IR/ROXICODONE) 5 MG immediate release tablet Take 1-2 tablets (5-10 mg  total) by mouth every 8 (eight) hours as needed for severe pain. 30 tablet 0   No facility-administered medications prior to visit.     Allergies  Allergen Reactions  . Flexeril [Cyclobenzaprine] Other (See Comments)    RLS-like symptoms   . Seroquel [Quetiapine Fumarate] Other (See Comments)    RLS-like symptoms  ROS Review of Systems  Constitutional: Negative.   Respiratory: Positive for shortness of breath (1-2 times at night).   Cardiovascular: Negative.   Genitourinary: Positive for dysuria, frequency and urgency.  Musculoskeletal: Positive for back pain.  Skin: Negative.   Neurological: Negative.   Psychiatric/Behavioral: Negative.       Objective:    Physical Exam  Constitutional: She is oriented to person, place, and time. She appears well-developed and well-nourished.  HENT:  Head: Normocephalic.  Eyes: Pupils are equal, round, and reactive to light.  Neck: Normal range of motion.  Cardiovascular: Normal rate.  Pulmonary/Chest: Effort normal and breath sounds normal.  Abdominal: Soft.  Central obesity  Musculoskeletal:     Lumbar back: She exhibits tenderness.       Back:     Comments: Limping to right leg  Neurological: She is alert and oriented to person, place, and time.  Skin: Skin is warm and dry.  Psychiatric: She has a normal mood and affect. Her behavior is normal. Judgment and thought content normal.    BP (!) 132/92 (BP Location: Left Arm, Patient Position: Sitting)   Pulse 89   Ht 5\' 2"  (1.575 m)   Wt 213 lb (96.6 kg)   SpO2 98%   BMI 38.96 kg/m  Wt Readings from Last 3 Encounters:  10/26/18 213 lb (96.6 kg)  09/29/18 211 lb 3.2 oz (95.8 kg)  09/24/18 201 lb (91.2 kg)   She was encouraged to continue on weight loss regimen.  Health Maintenance Due  Topic Date Due  . PAP SMEAR-Modifier  02/08/1993  . INFLUENZA VACCINE  08/28/2018    There are no preventive care reminders to display for this patient.  Lab Results  Component  Value Date   TSH 1.192 04/30/2018   Lab Results  Component Value Date   WBC 9.1 09/14/2018   HGB 10.3 (L) 09/14/2018   HCT 34.3 (L) 09/14/2018   MCV 72.7 (L) 09/14/2018   PLT 272 09/14/2018   Lab Results  Component Value Date   NA 136 09/14/2018   K 4.0 09/14/2018   CO2 25 09/14/2018   GLUCOSE 99 09/14/2018   BUN 11 09/14/2018   CREATININE 0.92 09/14/2018   BILITOT <0.1 (L) 09/03/2018   ALKPHOS 119 09/03/2018   AST 76 (H) 09/03/2018   ALT 89 (H) 09/03/2018   PROT 6.5 09/03/2018   ALBUMIN 3.2 (L) 09/03/2018   CALCIUM 8.2 (L) 09/14/2018   ANIONGAP 8 09/14/2018   Lab Results  Component Value Date   CHOL 163 07/07/2018   Lab Results  Component Value Date   HDL 48 07/07/2018   Lab Results  Component Value Date   LDLCALC 83 07/07/2018   Lab Results  Component Value Date   TRIG 159 (H) 07/07/2018   Lab Results  Component Value Date   CHOLHDL 3.4 07/07/2018   Lab Results  Component Value Date   HGBA1C 5.1 05/02/2016      Assessment & Plan:    1. Abnormal EKG - She will continue on Diltiazem and was encouraged to schedule an appointment with Cardiologist. - diltiazem (DILTIAZEM CD) 120 MG 24 hr capsule; Take 1 capsule (120 mg total) by mouth daily.  Dispense: 30 capsule; Refill: 0  2. History of hepatitis - She was encouraged to schedule an appointment with Gastroenterology for Hepatitis treatment.  3. Urinary tract infection symptoms - Urinalysis will be checked for possible UTI. - Urinalysis; Future - UA/M w/rflx Culture, Routine; Future  4. Right hip  pain - She will continue to follow up with Dr Gershon Mussel. - She continues to take Hydrocodone- acetaminiphen 5-325 mg 1-2 tablets daily prn 5. Sleep apnea, unspecified type - She was encouraged to complete [facility] charity care application for  - Ambulatory referral to Neurology for Sleep study.  6. Health care maintenance  - CBC w/Diff; Future - HgB A1c; Future - Lipid panel; Future - Flu Vaccine  QUAD 6+ mos PF IM (Fluarix Quad PF) was administered during visit.   7. Acute midline low back pain without sciatica - She was advised to notify Dr Gershon Mussel during her visit tomorrow about her back pain.    Follow-up: Return in about 1 month (around 11/25/2018), or if symptoms worsen or fail to improve.    Klay Sobotka Trellis Paganini, NP

## 2018-10-27 ENCOUNTER — Other Ambulatory Visit: Payer: Self-pay | Admitting: Physician Assistant

## 2018-10-27 ENCOUNTER — Other Ambulatory Visit: Payer: Medicaid Other

## 2018-10-27 ENCOUNTER — Ambulatory Visit: Payer: Medicaid Other | Admitting: Orthopaedic Surgery

## 2018-10-27 ENCOUNTER — Telehealth: Payer: Self-pay | Admitting: Orthopaedic Surgery

## 2018-10-27 NOTE — Telephone Encounter (Signed)
Patient could not keep her appointment today. She would like to know if she could have pain meds refilled. Her call back number is [phone]

## 2018-10-27 NOTE — Telephone Encounter (Signed)
Too  early.  Can fill Friday.  Have her call back then to remind [place]

## 2018-10-27 NOTE — Telephone Encounter (Signed)
See message below. She R/S to 11/02/2018.

## 2018-10-29 ENCOUNTER — Other Ambulatory Visit: Payer: Self-pay | Admitting: Physician Assistant

## 2018-10-29 MED ORDER — HYDROCODONE-ACETAMINOPHEN 5-325 MG PO TABS: 1.0000 | ORAL_TABLET | Freq: Every day | ORAL | 0 refills | Status: DC | PRN

## 2018-10-29 NOTE — Telephone Encounter (Signed)
Can you fill for her please.

## 2018-10-29 NOTE — Telephone Encounter (Signed)
Last refill.  Use sparingly.  Called in by me

## 2018-10-29 NOTE — Telephone Encounter (Signed)
I left voicemail for patient advising. 

## 2018-11-02 ENCOUNTER — Ambulatory Visit: Payer: Medicaid Other | Admitting: Orthopaedic Surgery

## 2018-11-08 ENCOUNTER — Other Ambulatory Visit: Payer: Medicaid Other

## 2018-11-10 ENCOUNTER — Telehealth: Payer: Self-pay | Admitting: Orthopaedic Surgery

## 2018-11-10 NOTE — Telephone Encounter (Signed)
See message below °

## 2018-11-10 NOTE — Telephone Encounter (Signed)
Patient called. She would like pain medication called in. Her call back number is [phone]

## 2018-11-11 NOTE — Telephone Encounter (Signed)
Called patient to advise we can only give her Tramadol. No answer. LMOM. If she calls back we need to know if she does want tramadol and if so what pharmacy does she use

## 2018-11-11 NOTE — Telephone Encounter (Signed)
Called patient. She does not want tramadol.she said she will wait until her appt Tuesday.

## 2018-11-11 NOTE — Telephone Encounter (Signed)
I can call in tramadol but that is strongest we can call in at this point.  She was notified last narcotic refilled was the last one that we could call in

## 2018-11-12 ENCOUNTER — Other Ambulatory Visit: Payer: Self-pay

## 2018-11-12 ENCOUNTER — Encounter: Payer: Self-pay | Admitting: Cardiology

## 2018-11-12 ENCOUNTER — Ambulatory Visit (INDEPENDENT_AMBULATORY_CARE_PROVIDER_SITE_OTHER): Payer: Medicaid Other | Admitting: Cardiology

## 2018-11-12 VITALS — BP 117/81 | HR 92 | Ht 62.0 in | Wt 215.0 lb

## 2018-11-12 DIAGNOSIS — R0600 Dyspnea, unspecified: Secondary | ICD-10-CM

## 2018-11-12 DIAGNOSIS — I10 Essential (primary) hypertension: Secondary | ICD-10-CM

## 2018-11-12 DIAGNOSIS — F17200 Nicotine dependence, unspecified, uncomplicated: Secondary | ICD-10-CM

## 2018-11-12 DIAGNOSIS — R0609 Other forms of dyspnea: Secondary | ICD-10-CM

## 2018-11-12 DIAGNOSIS — R9431 Abnormal electrocardiogram [ECG] [EKG]: Secondary | ICD-10-CM

## 2018-11-12 DIAGNOSIS — R079 Chest pain, unspecified: Secondary | ICD-10-CM

## 2018-11-12 NOTE — Progress Notes (Signed)
Cardiology Office Note:    Date:  11/12/2018   ID:  Brenda [name], DOB 06-May-1970, MRN [id number]  PCP:  Rolm Gala, NP  Cardiologist:  Debbe Odea, MD  Electrophysiologist:  None   Referring MD: Rolm Gala, NP   Chief Complaint  Patient presents with  . New Patient (Initial Visit)    Ptient c/o SOB, Chest pain, and swelling in ankles. Meds reviewed verbally with patient.     History of Present Illness:     Brenda [name] is a 48 y.o. female with a hx of hypertension, current smoker x30 years, who presents due to chest discomfort and also dyspnea on exertion.  Patient admitted about 4 months ago due to a pneumonia.  During that admission she was noted to be hypotensive and also tachycardic.  She was advised to see a cardiologist and started on diltiazem which she currently takes.  She states feeling short of breath with exertion which has worsened over the past couple of years.  She was seen about 4 5 years ago for similar reasons by a cardiologist.  She states while she was doing the exercise treadmill test, the test was stopped prematurely and she never followed up for results.  She lost her insurance and is trying to get back on track with her health.  She has some edema which worsens throughout the day.  She sleeps on the side and cannot sleep on her back due to recent hip surgery.  She snores when she sleeps and is being scheduled to see a specialist for sleep study.  Past Medical History:  Diagnosis Date  . Ankle fracture, left    in past.  . Anxiety   . Arthritis    Right hip, scheduled for replacement 7/13/29m, left ankle  . Asthma   . Bipolar disorder ([facility])   . Chronic hepatitis C ([facility])   . Depression   . Dyspnea    occasional with exertion  . GERD (gastroesophageal reflux disease)    pmh  . Headache   . Hearing loss    some loss in both ears - no dx - per patient "runs in family", no hearing aids  . Hypertension   . Lump in female  breast    right  . Pneumonia    x 1  . Polysubstance abuse ([facility])   . Primary localized osteoarthritis of hip    right  . SVD (spontaneous vaginal delivery)    x 3  . Uses roller walker    occasional  . Wears dentures    full upper    Past Surgical History:  Procedure Laterality Date  . TOTAL HIP ARTHROPLASTY Right 09/13/2018  . TOTAL HIP ARTHROPLASTY Right 09/13/2018   Procedure: RIGHT TOTAL HIP ARTHROPLASTY ANTERIOR APPROACH;  Surgeon: Tarry Kos, MD;  Location: [facility];  Service: Orthopedics;  Laterality: Right;  . TUBAL LIGATION      Current Medications: Current Meds  Medication Sig  . aspirin 81 MG chewable tablet Chew 81 mg by mouth daily.  Marland Kitchen diltiazem (DILTIAZEM CD) 120 MG 24 hr capsule Take 1 capsule (120 mg total) by mouth daily.  . Fluticasone-Salmeterol (ADVAIR) 100-50 MCG/DOSE AEPB Inhale 1 puff into the lungs 2 (two) times daily.  Marland Kitchen HYDROcodone-acetaminophen (NORCO) 5-325 MG tablet Take 1-2 tablets by mouth daily as needed for moderate pain.  Marland Kitchen HYDROcodone-acetaminophen (NORCO) 5-325 MG tablet Take 1 tablet by mouth daily as needed for moderate pain.  Marland Kitchen ibuprofen (ADVIL) 800 MG  tablet Take 1 tablet (800 mg total) by mouth every 8 (eight) hours as needed.     Allergies:   Flexeril [cyclobenzaprine] and Seroquel [quetiapine fumarate]   Social History   Socioeconomic History  . Marital status: Single    Spouse name: Not on file  . Number of children: Not on file  . Years of education: Not on file  . Highest education level: Not on file  Occupational History  . Not on file  Social Needs  . Financial resource strain: Not on file  . Food insecurity    Worry: Not on file    Inability: Not on file  . Transportation needs    Medical: Not on file    Non-medical: Not on file  Tobacco Use  . Smoking status: Current Every Day Smoker    Packs/day: 1.00    Years: 31.00    Pack years: 31.00    Types: Cigarettes  . Smokeless tobacco: Never Used  . Tobacco  comment: since age 32  Substance and Sexual Activity  . Alcohol use: Yes    Comment: rare  . 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 Last cocaine 08/02/18  . Sexual activity: Not Currently    Birth control/protection: Surgical    Comment: tubal   Lifestyle  . Physical activity    Days per week: Not on file    Minutes per session: Not on file  . Stress: Not on file  Relationships  . Social Herbalist on phone: Not on file    Gets together: Not on file    Attends religious service: Not on file    Active member of club or organization: Not on file    Attends meetings of clubs or organizations: Not on file    Relationship status: Not on file  Other Topics Concern  . Not on file  Social History Narrative  . Not on file     Family History: The patient's family history includes Breast cancer in her sister. There is no history of Ovarian cancer or Colon cancer.  ROS:   Please see the history of present illness.     all other systems reviewed and are negative.  EKGs/Labs/Other Studies Reviewed:    The following studies were reviewed today:   EKG:  EKG is  ordered today.  The ekg ordered today demonstrates normal sinus rhythm, possible left atrial enlargement, possible old inferior infarct.  Recent Labs: 04/30/2018: TSH 1.192 05/22/2018: B Natriuretic Peptide 145.0 09/03/2018: ALT 89 09/14/2018: BUN 11; Creatinine, Ser 0.92; Hemoglobin 10.3; Platelets 272; Potassium 4.0; Sodium 136  Recent Lipid Panel    Component Value Date/Time   CHOL 163 07/07/2018 1132   TRIG 159 (H) 07/07/2018 1132   HDL 48 07/07/2018 1132   CHOLHDL 3.4 07/07/2018 1132   CHOLHDL 4.2 05/02/2016 0652   VLDL 26 05/02/2016 0652   LDLCALC 83 07/07/2018 1132    Physical Exam:    VS:  BP 117/81 (BP Location: Right Arm, Patient Position: Sitting, Cuff Size: Normal)   Pulse 92   Ht 5\' 2"  (1.575 m)   Wt 215 lb  (97.5 kg)   BMI 39.32 kg/m     Wt Readings from Last 3 Encounters:  11/12/18 215 lb (97.5 kg)  10/26/18 213 lb (96.6 kg)  09/29/18 211 lb 3.2 oz (95.8 kg)     GEN:  Well nourished, well developed in no acute distress HEENT: Normal NECK: No JVD; No carotid bruits LYMPHATICS: No lymphadenopathy CARDIAC: RRR, no murmurs, rubs, gallops RESPIRATORY: Poor inspiratory effort, clear to auscultation without rales, wheezing or rhonchi  ABDOMEN: Soft, non-tender, non-distended MUSCULOSKELETAL:  No edema; No deformity  SKIN: Warm and dry NEUROLOGIC:  Alert and oriented x 3 PSYCHIATRIC:  Normal affect   ASSESSMENT:   Patient has risk factors of hypertension, long smoking history.  Dyspnea on exertion seems to be angina equivalent.  She also has occasional chest pain with the dyspnea.  Due to her long smoking history pulmonary dysfunction is also possible. 1. Dyspnea on exertion   2. Chest pain of uncertain etiology   3. Abnormal EKG   4. Essential hypertension   5. Smoking    PLAN:    In order of problems listed above:  1. Get echocardiogram.  Lexiscan myocardial perfusion imaging. 2. Lexiscan as above. 3. Echo and Lexiscan 4. Blood pressure well controlled.  Continue diltiazem at current dose. 5. Smoking cessation advised.  Over 5 minutes spent counseling patient on smoking.  Total encounter time more than 60 minutes  Greater than 50% was spent in counseling and coordination of care with the patient   This note was generated in part or whole with voice recognition software. Voice recognition is usually quite accurate but there are transcription errors that can and very often do occur. I apologize for any typographical errors that were not detected and corrected.  Medication Adjustments/Labs and Tests Ordered: Current medicines are reviewed at length with the patient today.  Concerns regarding medicines are outlined above.  Orders Placed This Encounter  Procedures  . NM Myocar  Multi W/Spect W/Wall Motion / EF  . EKG 12-Lead  . ECHOCARDIOGRAM COMPLETE   No orders of the defined types were placed in this encounter.   Patient Instructions  Medication Instructions:   1. Your physician recommends that you continue on your current medications as directed. Please refer to the Current Medication list given to you today.  *If you need a refill on your cardiac medications before your next appointment, please call your pharmacy*  Lab Work:  1. None Ordered  If you have labs (blood work) drawn today and your tests are completely normal, you will receive your results only by: Marland Kitchen. MyChart Message (if you have MyChart) OR . A paper copy in the mail If you have any lab test that is abnormal or we need to change your treatment, we will call you to review the results.  Testing/Procedures:  1. Echocariogram Please return to [facility] on ______________ at _______________ AM/PM for an Echocardiogram. Your physician has requested that you have an echocardiogram. Echocardiography is a painless test that uses sound waves to create images of your heart. It provides your doctor with information about the size and shape of your heart and how well your heart's chambers and valves are working. This procedure takes approximately one hour. There are no restrictions for this procedure. Please note; depending on visual quality an IV may need to be placed.   1. [facility] MYOVIEW  Your caregiver has ordered a Stress Test with nuclear imaging. The purpose of this test is to evaluate the blood supply to your heart muscle. This procedure is referred to as a "Non-Invasive Stress Test." This is because other than having an IV started in your vein, nothing is inserted or "invades" your body. Cardiac stress tests are done to find areas of poor  blood flow to the heart by determining the extent of coronary artery disease (CAD). Some patients exercise on a treadmill, which naturally increases  the blood flow to your heart, while others who are  unable to walk on a treadmill due to physical limitations have a pharmacologic/chemical stress agent called Lexiscan . This medicine will mimic walking on a treadmill by temporarily increasing your coronary blood flow.   Please note: these test may take anywhere between 2-4 hours to complete  PLEASE REPORT TO [facility] MEDICAL MALL ENTRANCE  THE VOLUNTEERS AT THE FIRST DESK WILL DIRECT YOU WHERE TO GO   Instructions regarding medication:    __X__:  Hold Diltiazem the morning of the procedure.    PLEASE NOTIFY THE OFFICE AT LEAST 24 HOURS IN ADVANCE IF YOU ARE UNABLE TO KEEP YOUR APPOINTMENT.  [phone] AND  PLEASE NOTIFY NUCLEAR MEDICINE AT [facility] AT LEAST 24 HOURS IN ADVANCE IF YOU ARE UNABLE TO KEEP YOUR APPOINTMENT. [phone]  How to prepare for your Myoview test:  1. Do not eat or drink after midnight 2. No caffeine for 24 hours prior to test 3. No smoking 24 hours prior to test. 4. Your medication may be taken with water.  If your doctor stopped a medication because of this test, do not take that medication. 5. Ladies, please do not wear dresses.  Skirts or pants are appropriate. Please wear a short sleeve shirt. 6. No perfume, cologne or lotion. 7. Wear comfortable walking shoes. No heels!   Follow-Up: At [facility], you and your health needs are our priority.  As part of our continuing mission to provide you with exceptional heart care, we have created designated Provider Care Teams.  These Care Teams include your primary Cardiologist (physician) and Advanced Practice Providers (APPs -  Physician Assistants and Nurse Practitioners) who all work together to provide you with the care you need, when you need it.  Your next appointment:   6 weeks  The format for your next appointment:   In Person  Provider:   Debbe Odea, MD      Signed, Debbe Odea, MD  11/12/2018 5:09 PM    [facility]  HeartCare

## 2018-11-12 NOTE — Patient Instructions (Signed)
Medication Instructions:   1. Your physician recommends that you continue on your current medications as directed. Please refer to the Current Medication list given to you today.  *If you need a refill on your cardiac medications before your next appointment, please call your pharmacy*  Lab Work:  1. None Ordered  If you have labs (blood work) drawn today and your tests are completely normal, you will receive your results only by: Marland Kitchen MyChart Message (if you have MyChart) OR . A paper copy in the mail If you have any lab test that is abnormal or we need to change your treatment, we will call you to review the results.  Testing/Procedures:  1. Echocariogram Please return to [facility] on ______________ at _______________ AM/PM for an Echocardiogram. Your physician has requested that you have an echocardiogram. Echocardiography is a painless test that uses sound waves to create images of your heart. It provides your doctor with information about the size and shape of your heart and how well your heart's chambers and valves are working. This procedure takes approximately one hour. There are no restrictions for this procedure. Please note; depending on visual quality an IV may need to be placed.   1. [place]  Your caregiver has ordered a Stress Test with nuclear imaging. The purpose of this test is to evaluate the blood supply to your heart muscle. This procedure is referred to as a "Non-Invasive Stress Test." This is because other than having an IV started in your vein, nothing is inserted or "invades" your body. Cardiac stress tests are done to find areas of poor blood flow to the heart by determining the extent of coronary artery disease (CAD). Some patients exercise on a treadmill, which naturally increases the blood flow to your heart, while others who are  unable to walk on a treadmill due to physical limitations have a pharmacologic/chemical stress agent called Lexiscan .  This medicine will mimic walking on a treadmill by temporarily increasing your coronary blood flow.   Please note: these test may take anywhere between 2-4 hours to complete  PLEASE REPORT TO [facility] WILL DIRECT YOU WHERE TO GO   Instructions regarding medication:    __X__:  Hold Diltiazem the morning of the procedure.    PLEASE NOTIFY THE OFFICE AT LEAST 76 HOURS IN ADVANCE IF YOU ARE UNABLE TO KEEP YOUR APPOINTMENT.  [phone] AND  PLEASE NOTIFY NUCLEAR MEDICINE AT [facility] AT LEAST 24 HOURS IN ADVANCE IF YOU ARE UNABLE TO KEEP YOUR APPOINTMENT. [phone]  How to prepare for your Myoview test:  1. Do not eat or drink after midnight 2. No caffeine for 24 hours prior to test 3. No smoking 24 hours prior to test. 4. Your medication may be taken with water.  If your doctor stopped a medication because of this test, do not take that medication. 5. Ladies, please do not wear dresses.  Skirts or pants are appropriate. Please wear a short sleeve shirt. 6. No perfume, cologne or lotion. 7. Wear comfortable walking shoes. No heels!   Follow-Up: At [facility], you and your health needs are our priority.  As part of our continuing mission to provide you with exceptional heart care, we have created designated Provider Care Teams.  These Care Teams include your primary Cardiologist (physician) and Advanced Practice Providers (APPs -  Physician Assistants and Nurse Practitioners) who all work together to provide you with the care you  need, when you need it.  Your next appointment:   6 weeks  The format for your next appointment:   In Person  Provider:   Debbe Odea, MD

## 2018-11-15 ENCOUNTER — Telehealth: Payer: Self-pay | Admitting: Cardiology

## 2018-11-15 NOTE — Telephone Encounter (Signed)
Attempted to schedule fu and testing

## 2018-11-16 ENCOUNTER — Other Ambulatory Visit: Payer: Self-pay

## 2018-11-16 ENCOUNTER — Ambulatory Visit (INDEPENDENT_AMBULATORY_CARE_PROVIDER_SITE_OTHER): Payer: Self-pay | Admitting: Physician Assistant

## 2018-11-16 ENCOUNTER — Encounter: Payer: Self-pay | Admitting: Orthopaedic Surgery

## 2018-11-16 ENCOUNTER — Ambulatory Visit (INDEPENDENT_AMBULATORY_CARE_PROVIDER_SITE_OTHER): Payer: Self-pay

## 2018-11-16 DIAGNOSIS — Z96641 Presence of right artificial hip joint: Secondary | ICD-10-CM

## 2018-11-16 MED ORDER — PREDNISONE 10 MG (21) PO TBPK: ORAL_TABLET | ORAL | 0 refills | Status: DC

## 2018-11-16 MED ORDER — MELOXICAM 7.5 MG PO TABS: ORAL_TABLET | ORAL | 2 refills | Status: DC

## 2018-11-16 MED ORDER — ACETAMINOPHEN-CODEINE #3 300-30 MG PO TABS: 1.0000 | ORAL_TABLET | Freq: Four times a day (QID) | ORAL | 0 refills | Status: DC | PRN

## 2018-11-16 NOTE — Progress Notes (Signed)
Post-Op Visit Note   Patient: Brenda [name]           Date of Birth: 1970-04-21           MRN: [id number] Visit Date: 11/16/2018 PCP: Rolm Gala, NP   Assessment & Plan:  Chief Complaint:  Chief Complaint  Patient presents with  . Right Hip - Pain   Visit Diagnoses:  1. Status post right hip replacement     Plan: Patient is a 48 year old female who presents to our clinic today 64 days status post right anterior total hip replacement, date of surgery 09/13/2018.  She has been doing well.  She never had home health PT or went to outpatient physical therapy and is struggling a little with lifting her right leg.  She has also been complaining of right lower back pain radiating down her right leg.  She does have a history of sciatica.  No fevers or chills.  Examination of her right lower extremity reveals negative logroll.  She does have a hard time flexing her hip.  She has a positive straight leg raise.  Her calf is soft nontender.  She is neurovascularly intact distally.  At this point, she has agreed to start formal physical therapy to help regain range of motion and strength.  We will also have them work on core strengthening for her back.  I have called in a Sterapred taper as well as Tylenol 3.  She will follow up with [place] in 4 weeks time for repeat evaluation.  Follow-Up Instructions: Return in about 4 weeks (around 12/14/2018).   Orders:  Orders Placed This Encounter  Procedures  . XR HIP UNILAT W OR W/O PELVIS 2-3 VIEWS RIGHT   Meds ordered this encounter  Medications  . predniSONE (STERAPRED UNI-PAK 21 TAB) 10 MG (21) TBPK tablet    Sig: Take as directed    Dispense:  21 tablet    Refill:  0  . acetaminophen-codeine (TYLENOL #3) 300-30 MG tablet    Sig: Take 1 tablet by mouth every 6 (six) hours as needed for moderate pain.    Dispense:  30 tablet    Refill:  0  . meloxicam (MOBIC) 7.5 MG tablet    Sig: Take one tab po daily prn, but must wait until  finished with steroid taper before starting    Dispense:  30 tablet    Refill:  2    Imaging: Xr Hip Unilat W Or W/o Pelvis 2-3 Views Right  Result Date: 11/16/2018 X-rays demonstrate a well-seated prosthesis without complication   PMFS History: Patient Active Problem List   Diagnosis Date Noted  . History of hepatitis 10/26/2018  . Urinary tract infection symptoms 10/26/2018  . Sleep apnea 10/26/2018  . Back pain 10/26/2018  . Abnormal EKG 09/22/2018  . History of asthma 09/22/2018  . Right hip pain 09/22/2018  . Status post right hip replacement 09/13/2018  . Leg edema 08/22/2018  . Primary osteoarthritis of right hip 07/27/2018  . Vaginal discharge 07/14/2018  . Sepsis (HCC) 04/30/2018  . Opioid use disorder, moderate, dependence (HCC) 05/02/2016  . Tobacco use disorder 05/02/2016  . Severe recurrent major depression without psychotic features (HCC) 05/01/2016  . Cocaine use disorder, moderate, dependence (HCC) 05/01/2016  . Alcohol abuse 05/01/2016  . GERD (gastroesophageal reflux disease) 05/01/2016   Past Medical History:  Diagnosis Date  . Ankle fracture, left    in past.  . Anxiety   . Arthritis    Right  hip, scheduled for replacement 7/13/32m, left ankle  . Asthma   . Bipolar disorder ([place])   . Chronic hepatitis C ([place])   . Depression   . Dyspnea    occasional with exertion  . GERD (gastroesophageal reflux disease)    pmh  . Headache   . Hearing loss    some loss in both ears - no dx - per patient "runs in family", no hearing aids  . Hypertension   . Lump in female breast    right  . Pneumonia    x 1  . Polysubstance abuse ([place])   . Primary localized osteoarthritis of hip    right  . SVD (spontaneous vaginal delivery)    x 3  . Uses roller walker    occasional  . Wears dentures    full upper    Family History  Problem Relation Age of Onset  . Breast cancer Sister   . Ovarian cancer Neg Hx   . Colon cancer Neg Hx     Past Surgical  History:  Procedure Laterality Date  . TOTAL HIP ARTHROPLASTY Right 09/13/2018  . TOTAL HIP ARTHROPLASTY Right 09/13/2018   Procedure: RIGHT TOTAL HIP ARTHROPLASTY ANTERIOR APPROACH;  Surgeon: Leandrew Koyanagi, MD;  Location: [facility];  Service: Orthopedics;  Laterality: Right;  . TUBAL LIGATION     Social History   Occupational History  . Not on file  Tobacco Use  . Smoking status: Current Every Day Smoker    Packs/day: 1.00    Years: 31.00    Pack years: 31.00    Types: Cigarettes  . Smokeless tobacco: Never Used  . Tobacco comment: since age 45  Substance and Sexual Activity  . Alcohol use: Yes    Comment: rare  . 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 Last cocaine 08/02/18  . Sexual activity: Not Currently    Birth control/protection: Surgical    Comment: tubal

## 2018-11-23 ENCOUNTER — Other Ambulatory Visit: Payer: Self-pay | Admitting: Physician Assistant

## 2018-11-23 ENCOUNTER — Telehealth: Payer: Self-pay

## 2018-11-23 ENCOUNTER — Other Ambulatory Visit: Payer: Self-pay | Admitting: Gerontology

## 2018-11-23 ENCOUNTER — Other Ambulatory Visit: Payer: Self-pay | Admitting: Orthopaedic Surgery

## 2018-11-23 DIAGNOSIS — R9431 Abnormal electrocardiogram [ECG] [EKG]: Secondary | ICD-10-CM

## 2018-11-23 NOTE — Telephone Encounter (Signed)
Patient LVM asking for referral to a Pain Clinic in [place].  I LVM for pt that we do not send referrals to Pain Clinics. Asked pt to call back if she has any further questions.

## 2018-11-24 ENCOUNTER — Encounter: Payer: Medicaid Other | Admitting: Obstetrics and Gynecology

## 2018-11-24 ENCOUNTER — Other Ambulatory Visit: Payer: Medicaid Other

## 2018-11-26 NOTE — Telephone Encounter (Signed)
Ok for refill? 

## 2018-11-28 NOTE — Telephone Encounter (Signed)
yes

## 2018-12-01 ENCOUNTER — Telehealth: Payer: Self-pay | Admitting: Neurology

## 2018-12-01 ENCOUNTER — Institutional Professional Consult (permissible substitution): Payer: Self-pay | Admitting: Neurology

## 2018-12-01 NOTE — Telephone Encounter (Signed)
Patient was a no show to the apt for sleep consult today

## 2018-12-02 ENCOUNTER — Ambulatory Visit: Payer: Medicaid Other | Admitting: Gerontology

## 2018-12-02 ENCOUNTER — Encounter: Payer: Self-pay | Admitting: Neurology

## 2018-12-03 NOTE — Telephone Encounter (Signed)
Patient has Lexi scheduled. Doing follow up, Echo and 6 week f/u still need to be scheduled.

## 2018-12-08 NOTE — Telephone Encounter (Signed)
lmov to schedule echo and fu °

## 2018-12-09 ENCOUNTER — Ambulatory Visit: Payer: Self-pay | Admitting: Gastroenterology

## 2018-12-14 ENCOUNTER — Ambulatory Visit: Payer: Medicaid Other | Admitting: Orthopaedic Surgery

## 2018-12-16 ENCOUNTER — Telehealth: Payer: Self-pay | Admitting: Pharmacy Technician

## 2018-12-16 NOTE — Telephone Encounter (Signed)
Patient failed to provide [id number] signed by Everlene Balls and Social Security Award Letter from [place].  No additional medication assistance will be provided by [facility] without the required proof of income documentation.  Patient notified.  [facility]

## 2018-12-20 ENCOUNTER — Ambulatory Visit: Payer: Medicaid Other | Admitting: Gerontology

## 2018-12-20 ENCOUNTER — Other Ambulatory Visit: Payer: Self-pay

## 2018-12-20 DIAGNOSIS — R9431 Abnormal electrocardiogram [ECG] [EKG]: Secondary | ICD-10-CM

## 2018-12-20 DIAGNOSIS — R05 Cough: Secondary | ICD-10-CM | POA: Insufficient documentation

## 2018-12-20 DIAGNOSIS — R0789 Other chest pain: Secondary | ICD-10-CM

## 2018-12-20 DIAGNOSIS — R059 Cough, unspecified: Secondary | ICD-10-CM

## 2018-12-20 MED ORDER — DILTIAZEM HCL ER COATED BEADS 120 MG PO CP24: 120.0000 mg | ORAL_CAPSULE | Freq: Every day | ORAL | 3 refills | Status: DC

## 2018-12-20 NOTE — Progress Notes (Signed)
Established Patient Office Visit  Subjective:  Patient ID: Brenda [name], female    DOB: Oct 30, 1970  Age: 48 y.o. MRN: [id number]  CC: No chief complaint on file.   HPI Brenda [name] presents for complaint of chest tightness that has being going on for six days. She states that it 'feels like when she had pneumonia few months ago'. She reports that she had slight fever last Thursday, but currently denies fever , chills, sore throat, change in taste or smell, but states that she experiences intermittent shortness of breath. She also c/o of having cough with yellowish phlegm that has being going on for six days, though she reports experiencing  smokers cough. She continues to smoke 1 pack of cigarette daily and denies the desire to quit. She states that her right hip pain s/p right hip replacement has resolved. She followed up with  Orthopedic Surgery Venida Jarvis M.L PA-C on 11/16/18 and formal physical therapy was recommended. She also was seen by the Cardiologist Dr Garen Lah B on 11/12/2018 for Dyspnea on exertion, abnormal EKG and chest pain. She is scheduled for Echocardiogram and Lexiscan and will continue on diltiazem. She missed her lab appointment and no showed to her follow up appointments. She states that she's doing well and offers no further concerns.     Past Medical History:  Diagnosis Date  . Ankle fracture, left    in past.  . Anxiety   . Arthritis    Right hip, scheduled for replacement 7/13/67m, left ankle  . Asthma   . Bipolar disorder ([place])   . Chronic hepatitis C ([place])   . Depression   . Dyspnea    occasional with exertion  . GERD (gastroesophageal reflux disease)    pmh  . Headache   . Hearing loss    some loss in both ears - no dx - per patient "runs in family", no hearing aids  . Hypertension   . Lump in female breast    right  . Pneumonia    x 1  . Polysubstance abuse ([place])   . Primary localized osteoarthritis of hip    right  . SVD  (spontaneous vaginal delivery)    x 3  . Uses roller walker    occasional  . Wears dentures    full upper    Past Surgical History:  Procedure Laterality Date  . TOTAL HIP ARTHROPLASTY Right 09/13/2018  . TOTAL HIP ARTHROPLASTY Right 09/13/2018   Procedure: RIGHT TOTAL HIP ARTHROPLASTY ANTERIOR APPROACH;  Surgeon: Leandrew Koyanagi, MD;  Location: [facility];  Service: Orthopedics;  Laterality: Right;  . TUBAL LIGATION      Family History  Problem Relation Age of Onset  . Breast cancer Sister   . Ovarian cancer Neg Hx   . Colon cancer Neg Hx     Social History   Socioeconomic History  . Marital status: Single    Spouse name: Not on file  . Number of children: Not on file  . Years of education: Not on file  . Highest education level: Not on file  Occupational History  . Not on file  Social Needs  . Financial resource strain: Not on file  . Food insecurity    Worry: Not on file    Inability: Not on file  . Transportation needs    Medical: Not on file    Non-medical: Not on file  Tobacco Use  . Smoking status: Current Every Day Smoker    Packs/day:  1.00    Years: 31.00    Pack years: 31.00    Types: Cigarettes  . Smokeless tobacco: Never Used  . Tobacco comment: since age 29  Substance and Sexual Activity  . Alcohol use: Yes    Comment: rare  . 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 Last cocaine 08/02/18  . Sexual activity: Not Currently    Birth control/protection: Surgical    Comment: tubal   Lifestyle  . Physical activity    Days per week: Not on file    Minutes per session: Not on file  . Stress: Not on file  Relationships  . Social Herbalist on phone: Not on file    Gets together: Not on file    Attends religious service: Not on file    Active member of club or organization: Not on file    Attends meetings of clubs or organizations: Not on file    Relationship  status: Not on file  . Intimate partner violence    Fear of current or ex partner: Not on file    Emotionally abused: Not on file    Physically abused: Not on file    Forced sexual activity: Not on file  Other Topics Concern  . Not on file  Social History Narrative  . Not on file    Outpatient Medications Prior to Visit  Medication Sig Dispense Refill  . aspirin 81 MG chewable tablet Chew 81 mg by mouth daily.    . Fluticasone-Salmeterol (ADVAIR) 100-50 MCG/DOSE AEPB Inhale 1 puff into the lungs 2 (two) times daily. 60 each 2  . diltiazem (CARDIZEM CD) 120 MG 24 hr capsule TAKE 1 CAPSULE BY MOUTH DAILY 30 capsule 0  . acetaminophen-codeine (TYLENOL #3) 300-30 MG tablet Take 1 tablet by mouth every 6 (six) hours as needed for moderate pain. (Patient not taking: Reported on 12/20/2018) 30 tablet 0  . HYDROcodone-acetaminophen (NORCO) 5-325 MG tablet Take 1-2 tablets by mouth daily as needed for moderate pain. (Patient not taking: Reported on 12/20/2018) 20 tablet 0  . HYDROcodone-acetaminophen (NORCO) 5-325 MG tablet Take 1 tablet by mouth daily as needed for moderate pain. (Patient not taking: Reported on 12/20/2018) 20 tablet 0  . ibuprofen (ADVIL) 800 MG tablet TAKE 1 TABLET BY MOUTH EVERY 8 HOURS AS NEEDED (Patient not taking: Reported on 12/20/2018) 30 tablet 2  . meloxicam (MOBIC) 7.5 MG tablet Take one tab po daily prn, but must wait until finished with steroid taper before starting (Patient not taking: Reported on 12/20/2018) 30 tablet 2  . predniSONE (STERAPRED UNI-PAK 21 TAB) 10 MG (21) TBPK tablet Take as directed (Patient not taking: Reported on 12/20/2018) 21 tablet 0   No facility-administered medications prior to visit.     Allergies  Allergen Reactions  . Flexeril [Cyclobenzaprine] Other (See Comments)    RLS-like symptoms   . Seroquel [Quetiapine Fumarate] Other (See Comments)    RLS-like symptoms    ROS Review of Systems  Constitutional: Negative.  Negative for  chills and fever.  Respiratory: Positive for cough, chest tightness and shortness of breath. Negative for wheezing.   Cardiovascular: Negative.   Skin: Negative.   Neurological: Negative.   Psychiatric/Behavioral: Negative.       Objective:    Physical Exam No PE was done There were no vitals taken for this visit. Wt Readings from  Last 3 Encounters:  11/12/18 215 lb (97.5 kg)  10/26/18 213 lb (96.6 kg)  09/29/18 211 lb 3.2 oz (95.8 kg)     Health Maintenance Due  Topic Date Due  . PAP SMEAR-Modifier  02/08/1993    There are no preventive care reminders to display for this patient.  Lab Results  Component Value Date   TSH 1.192 04/30/2018   Lab Results  Component Value Date   WBC 9.1 09/14/2018   HGB 10.3 (L) 09/14/2018   HCT 34.3 (L) 09/14/2018   MCV 72.7 (L) 09/14/2018   PLT 272 09/14/2018   Lab Results  Component Value Date   NA 136 09/14/2018   K 4.0 09/14/2018   CO2 25 09/14/2018   GLUCOSE 99 09/14/2018   BUN 11 09/14/2018   CREATININE 0.92 09/14/2018   BILITOT <0.1 (L) 09/03/2018   ALKPHOS 119 09/03/2018   AST 76 (H) 09/03/2018   ALT 89 (H) 09/03/2018   PROT 6.5 09/03/2018   ALBUMIN 3.2 (L) 09/03/2018   CALCIUM 8.2 (L) 09/14/2018   ANIONGAP 8 09/14/2018   Lab Results  Component Value Date   CHOL 163 07/07/2018   Lab Results  Component Value Date   HDL 48 07/07/2018   Lab Results  Component Value Date   LDLCALC 83 07/07/2018   Lab Results  Component Value Date   TRIG 159 (H) 07/07/2018   Lab Results  Component Value Date   CHOLHDL 3.4 07/07/2018   Lab Results  Component Value Date   HGBA1C 5.1 05/02/2016      Assessment & Plan:    1. Abnormal EKG - She was encouraged to go for her scheduled Echo and Lexiscan and continue to take Diltiazem 120 mg daily. - diltiazem (CARDIZEM CD) 120 MG 24 hr capsule; Take 1 capsule (120 mg total) by mouth daily.  Dispense: 30 capsule; Refill: 3 - Comp Met (CMET); Future  2. Chest  tightness - Ddx Pneumonia, she was advised to go for chest x ray. - DG Chest 2 View; Future -CBC w/Diff; Future  3. Cough - She was advised to notify clinic for worsening cough and shortness of breath. She was strongly encouraged on smoking cessation.    Follow-up: Return in about 4 weeks (around 01/17/2019), or if symptoms worsen or fail to improve.    Reyes Fifield Jerold Coombe, NP

## 2018-12-21 ENCOUNTER — Other Ambulatory Visit: Payer: Self-pay

## 2018-12-21 ENCOUNTER — Encounter: Payer: Self-pay | Admitting: *Deleted

## 2018-12-21 ENCOUNTER — Ambulatory Visit
Admission: RE | Admit: 2018-12-21 | Discharge: 2018-12-21 | Disposition: A | Discharge status: Home / Self Care | Payer: Self-pay | Source: Ambulatory Visit | Attending: Gerontology | Admitting: Gerontology

## 2018-12-21 ENCOUNTER — Ambulatory Visit
Admission: RE | Admit: 2018-12-21 | Discharge: 2018-12-21 | Disposition: A | Discharge status: Home / Self Care | Payer: Self-pay | Attending: Gerontology | Admitting: Gerontology

## 2018-12-21 ENCOUNTER — Emergency Department: Payer: Medicaid Other

## 2018-12-21 ENCOUNTER — Emergency Department
Admission: EM | Admit: 2018-12-21 | Discharge: 2018-12-21 | Disposition: A | Discharge status: Home / Self Care | Payer: Medicaid Other | Attending: Emergency Medicine | Admitting: Emergency Medicine

## 2018-12-21 DIAGNOSIS — M7752 Other enthesopathy of left foot: Secondary | ICD-10-CM

## 2018-12-21 DIAGNOSIS — R0789 Other chest pain: Secondary | ICD-10-CM | POA: Insufficient documentation

## 2018-12-21 DIAGNOSIS — Z79899 Other long term (current) drug therapy: Secondary | ICD-10-CM | POA: Insufficient documentation

## 2018-12-21 DIAGNOSIS — M65872 Other synovitis and tenosynovitis, left ankle and foot: Secondary | ICD-10-CM | POA: Insufficient documentation

## 2018-12-21 DIAGNOSIS — I10 Essential (primary) hypertension: Secondary | ICD-10-CM | POA: Insufficient documentation

## 2018-12-21 DIAGNOSIS — F17210 Nicotine dependence, cigarettes, uncomplicated: Secondary | ICD-10-CM | POA: Insufficient documentation

## 2018-12-21 DIAGNOSIS — Z7982 Long term (current) use of aspirin: Secondary | ICD-10-CM | POA: Insufficient documentation

## 2018-12-21 IMAGING — CR DG ANKLE COMPLETE 3+V*L*
3 series · 3 of 3 positions shown · non-contrast
Comparison: [DATE]

CLINICAL DATA: Left ankle pain

EXAM:
LEFT ANKLE COMPLETE - 3+ VIEW

[ankle ap]
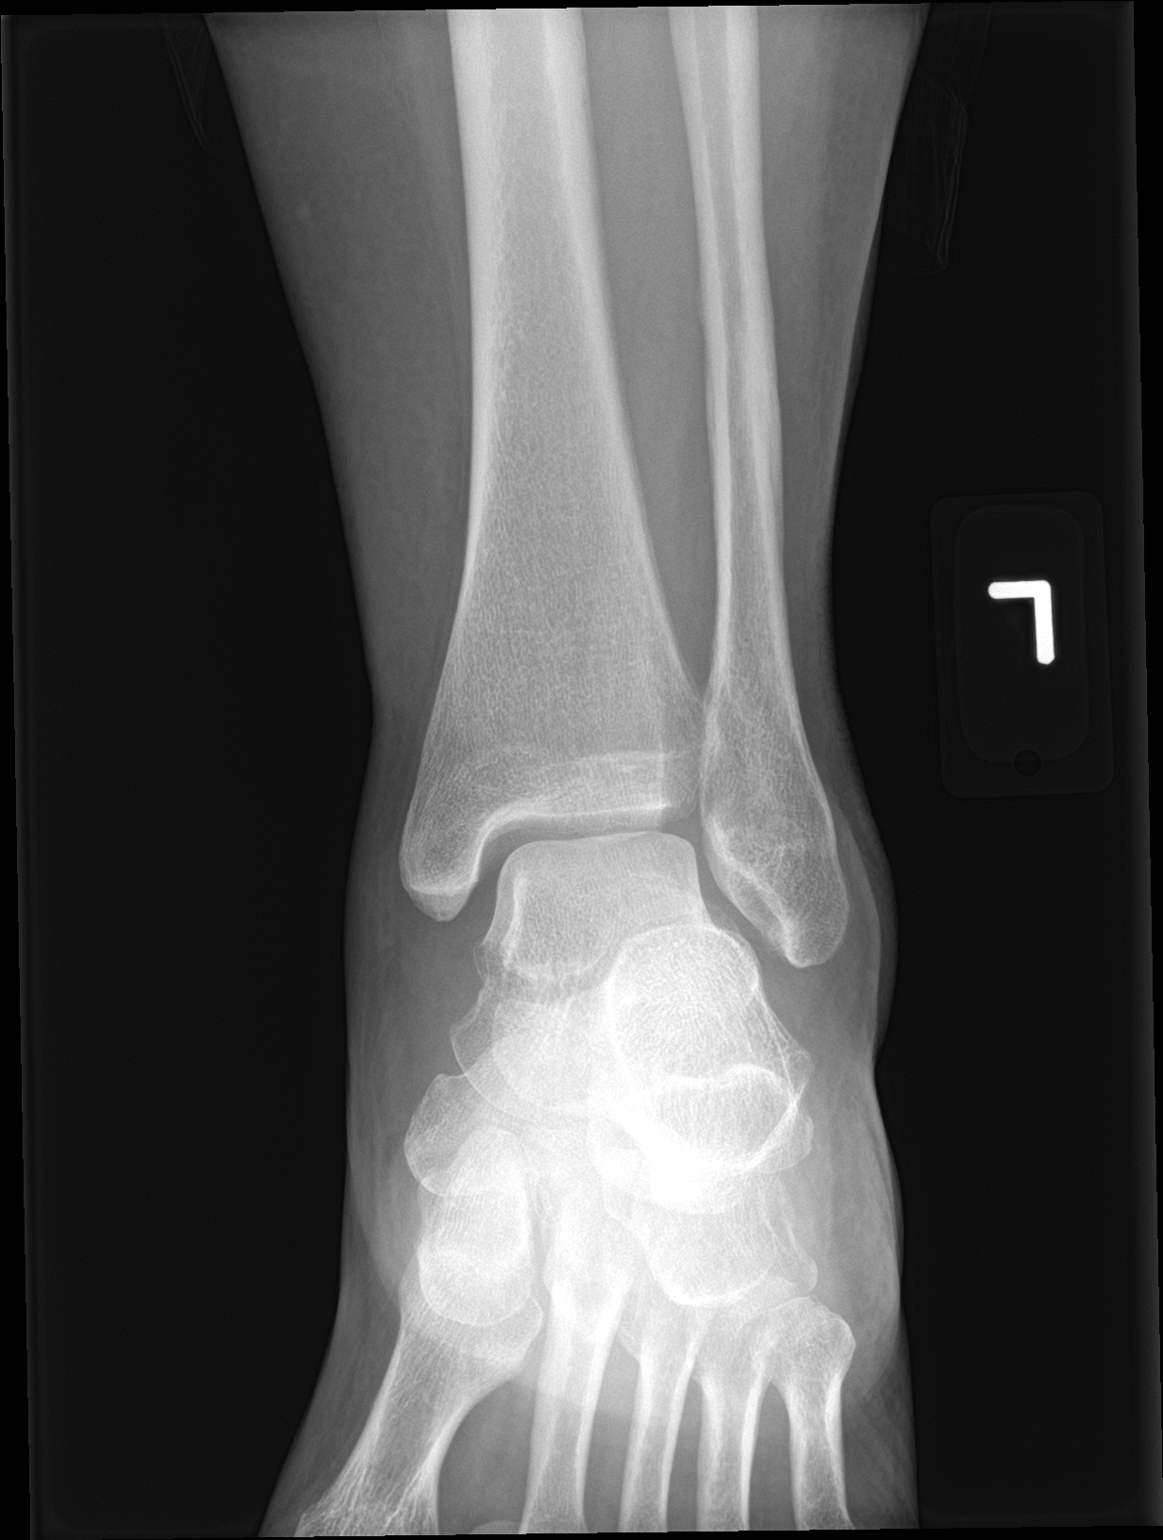

[ankle obl]
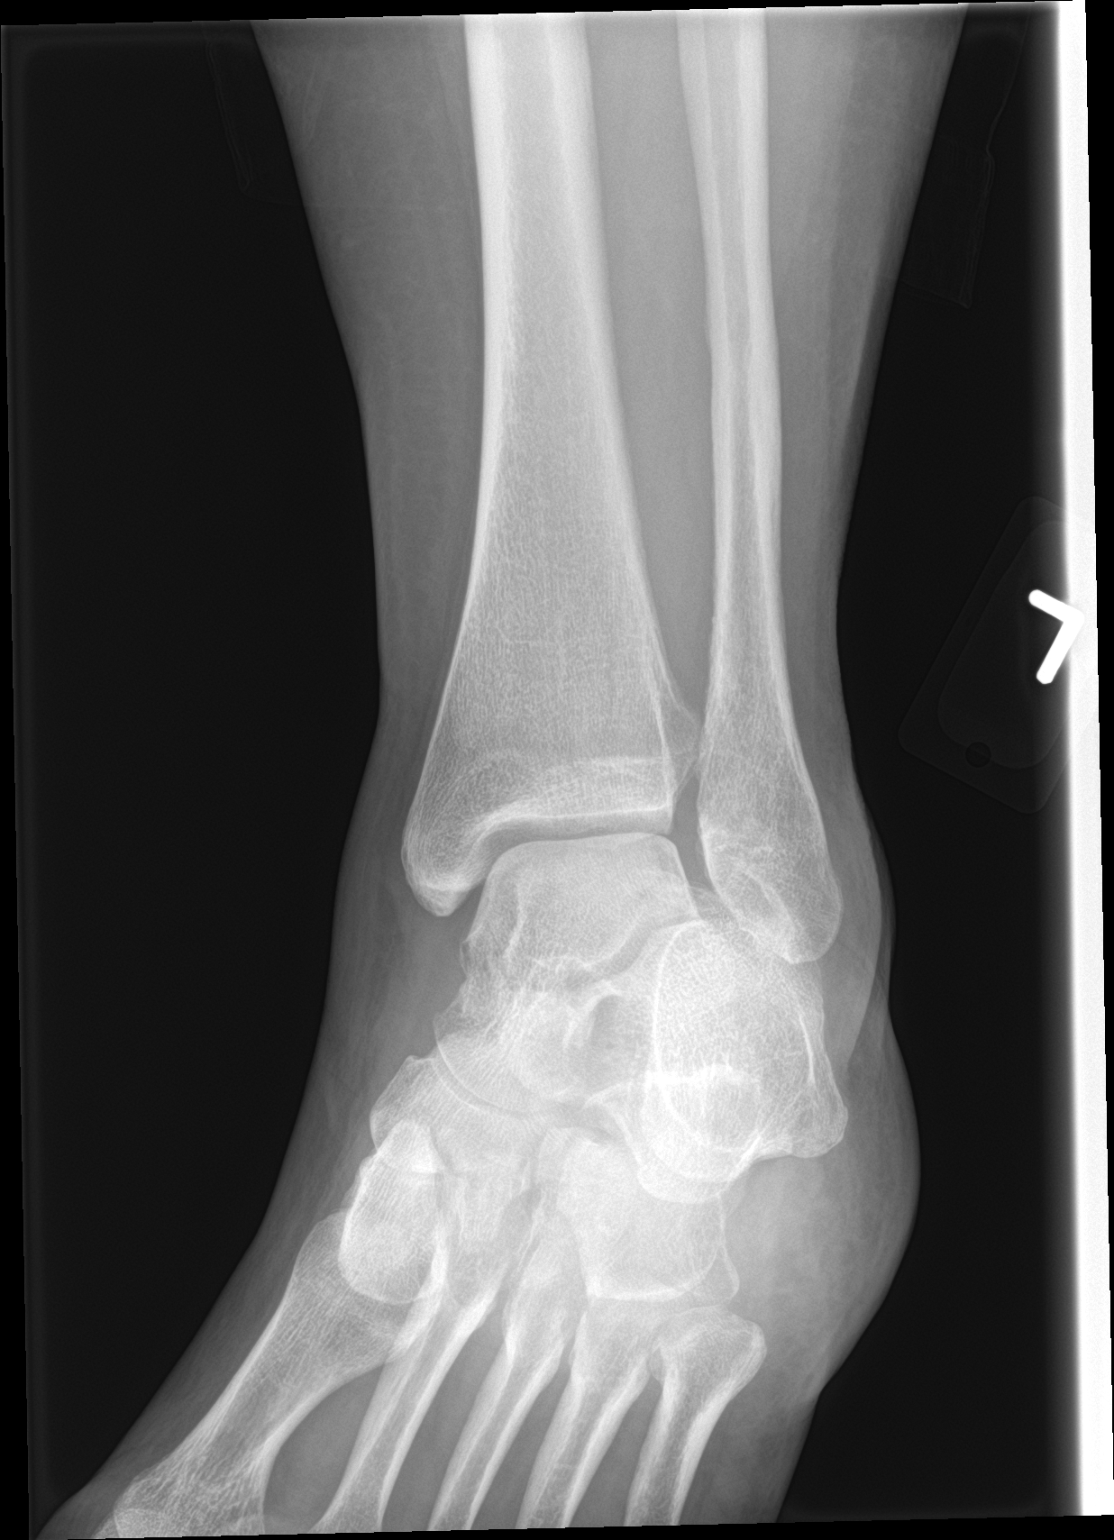

[ankle lat]
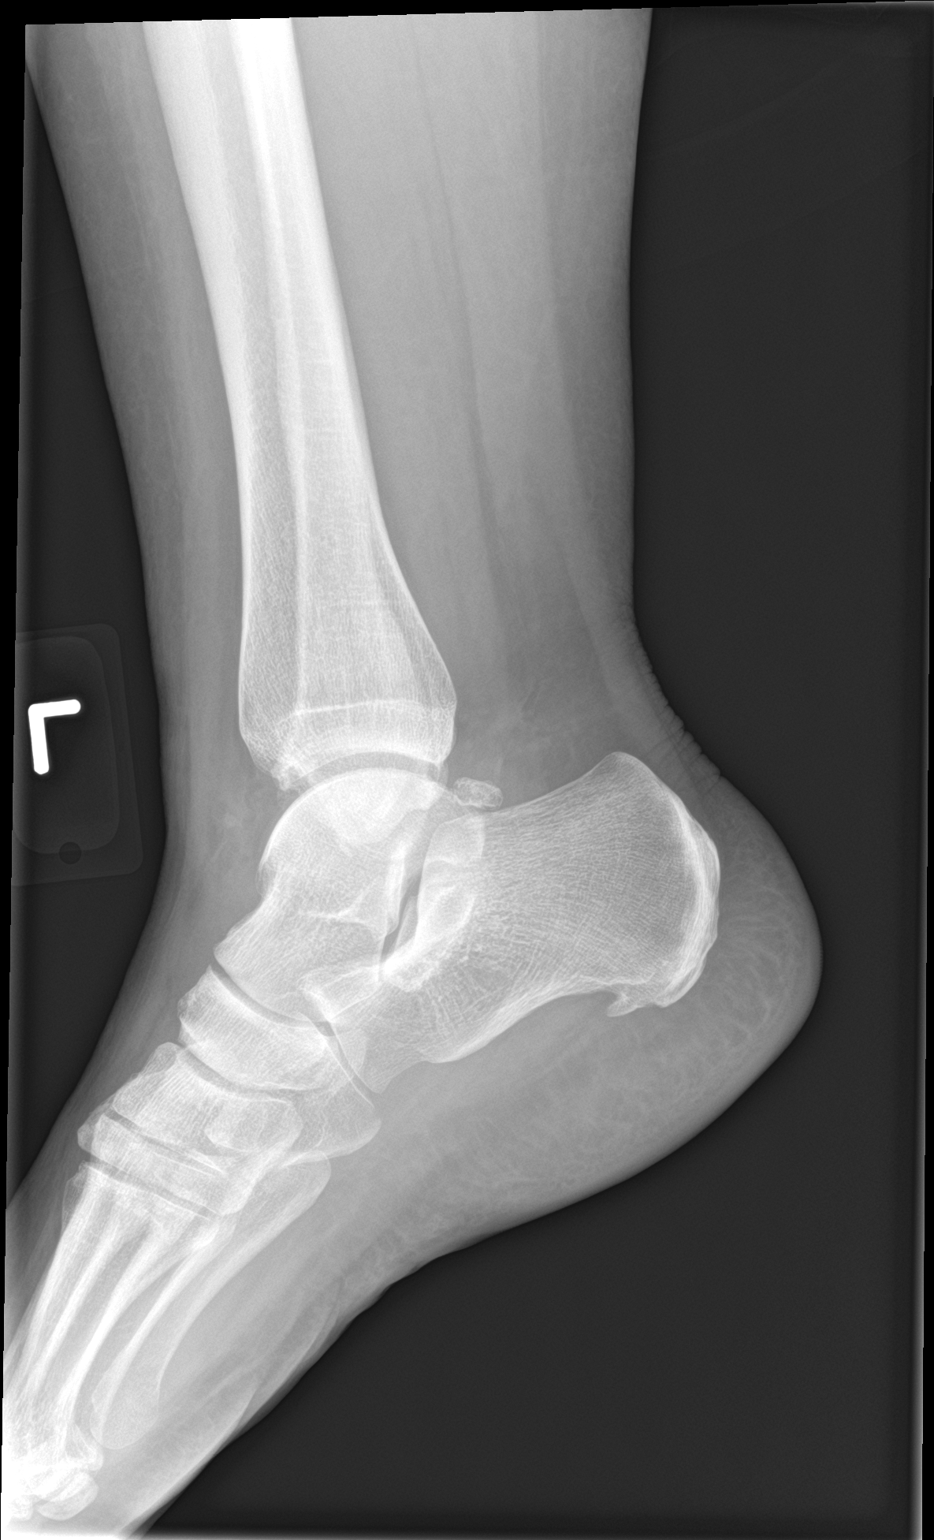

[3 of 3 positions shown; findings below may reference images not displayed]

FINDINGS: There is no evidence of fracture, dislocation, or joint effusion.
There is no evidence of arthropathy or other focal bone abnormality.
Soft tissues are unremarkable.
IMPRESSION: Negative.

## 2018-12-21 IMAGING — CR DG CHEST 2V
1 series · 2 of 2 positions shown · non-contrast
Comparison: [DATE]

CLINICAL DATA: Chest tightness

EXAM:
CHEST - 2 VIEW

[Series 1: dg chest 2 view · 0.14mm/px · 2 of 2 slices shown]
[im 1/2]
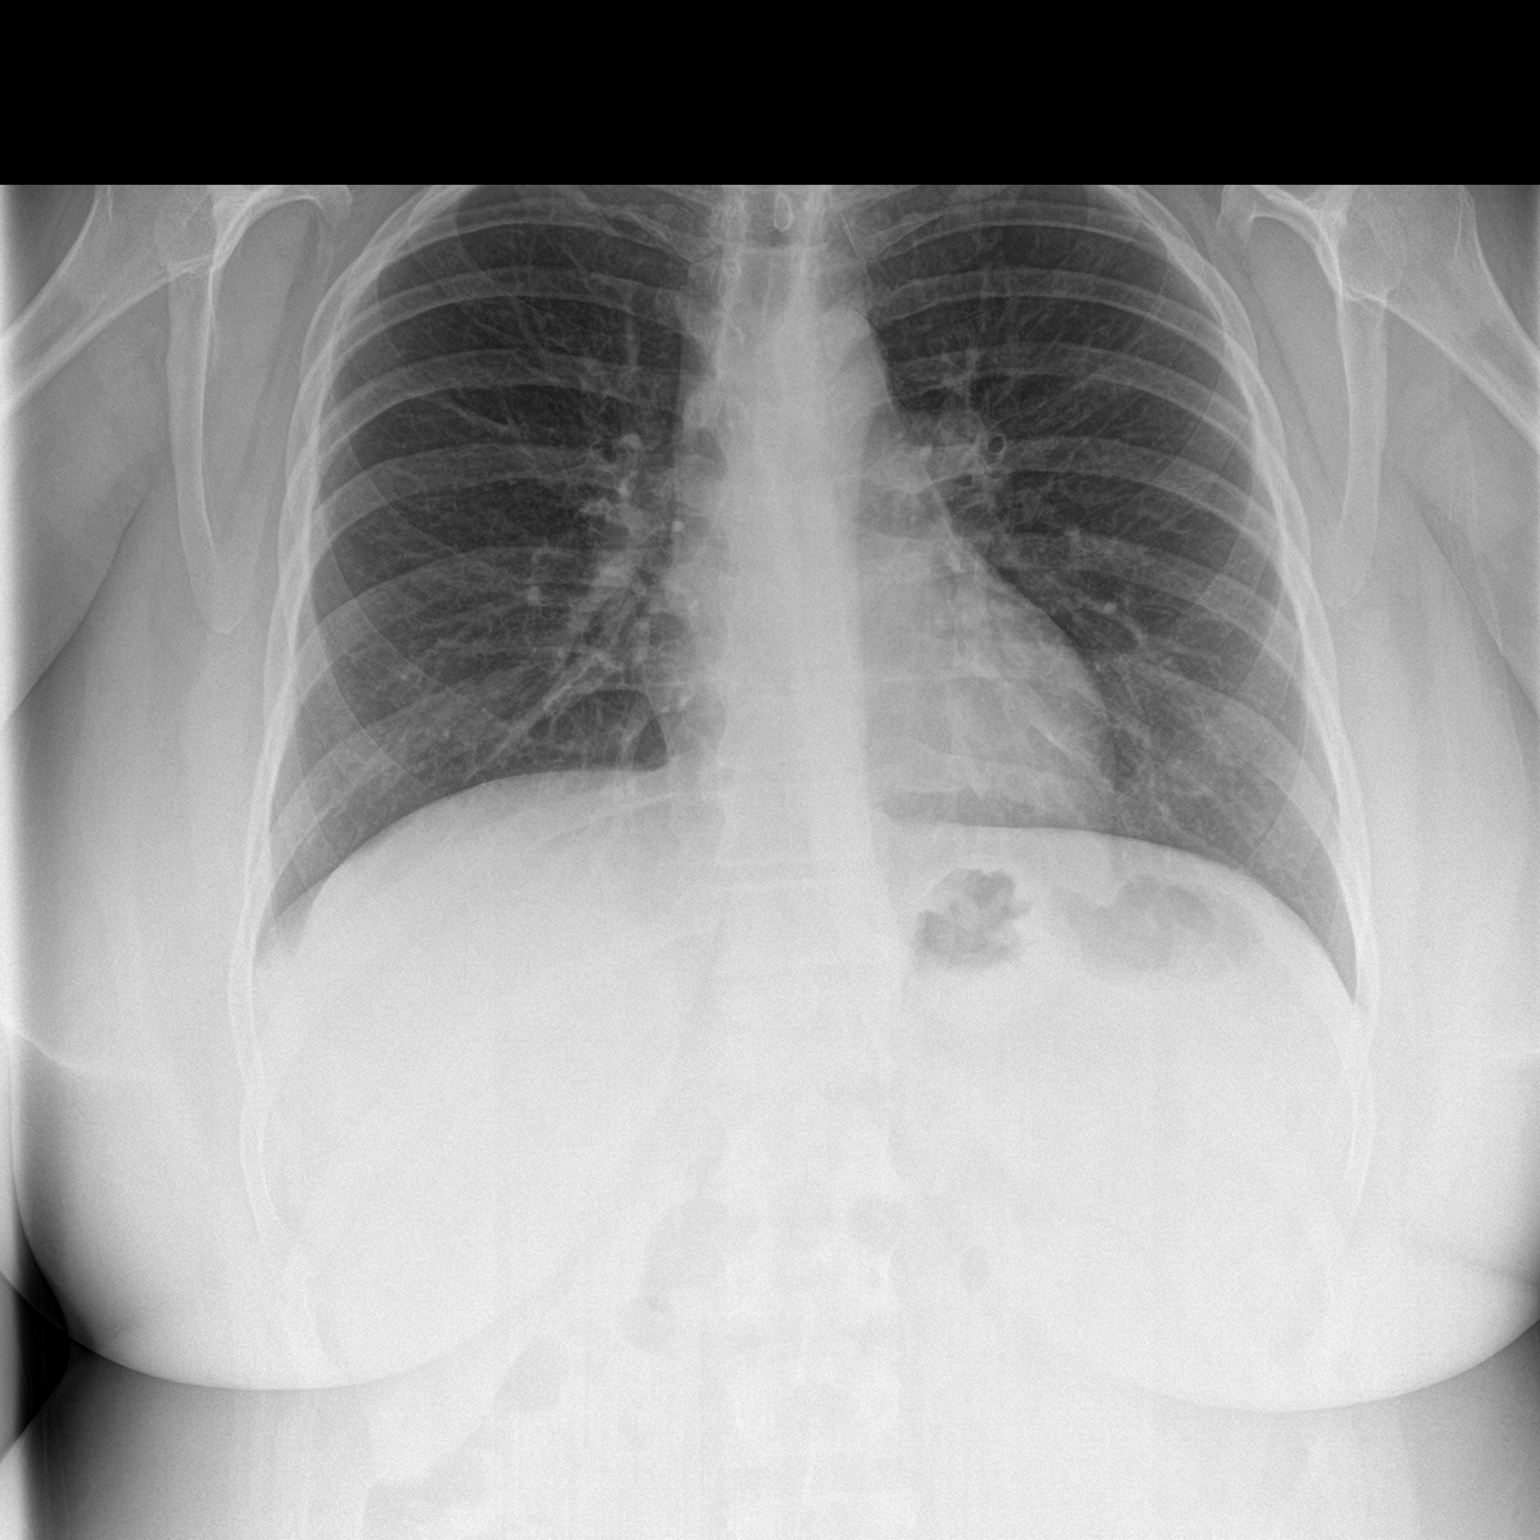
[im 2/2]
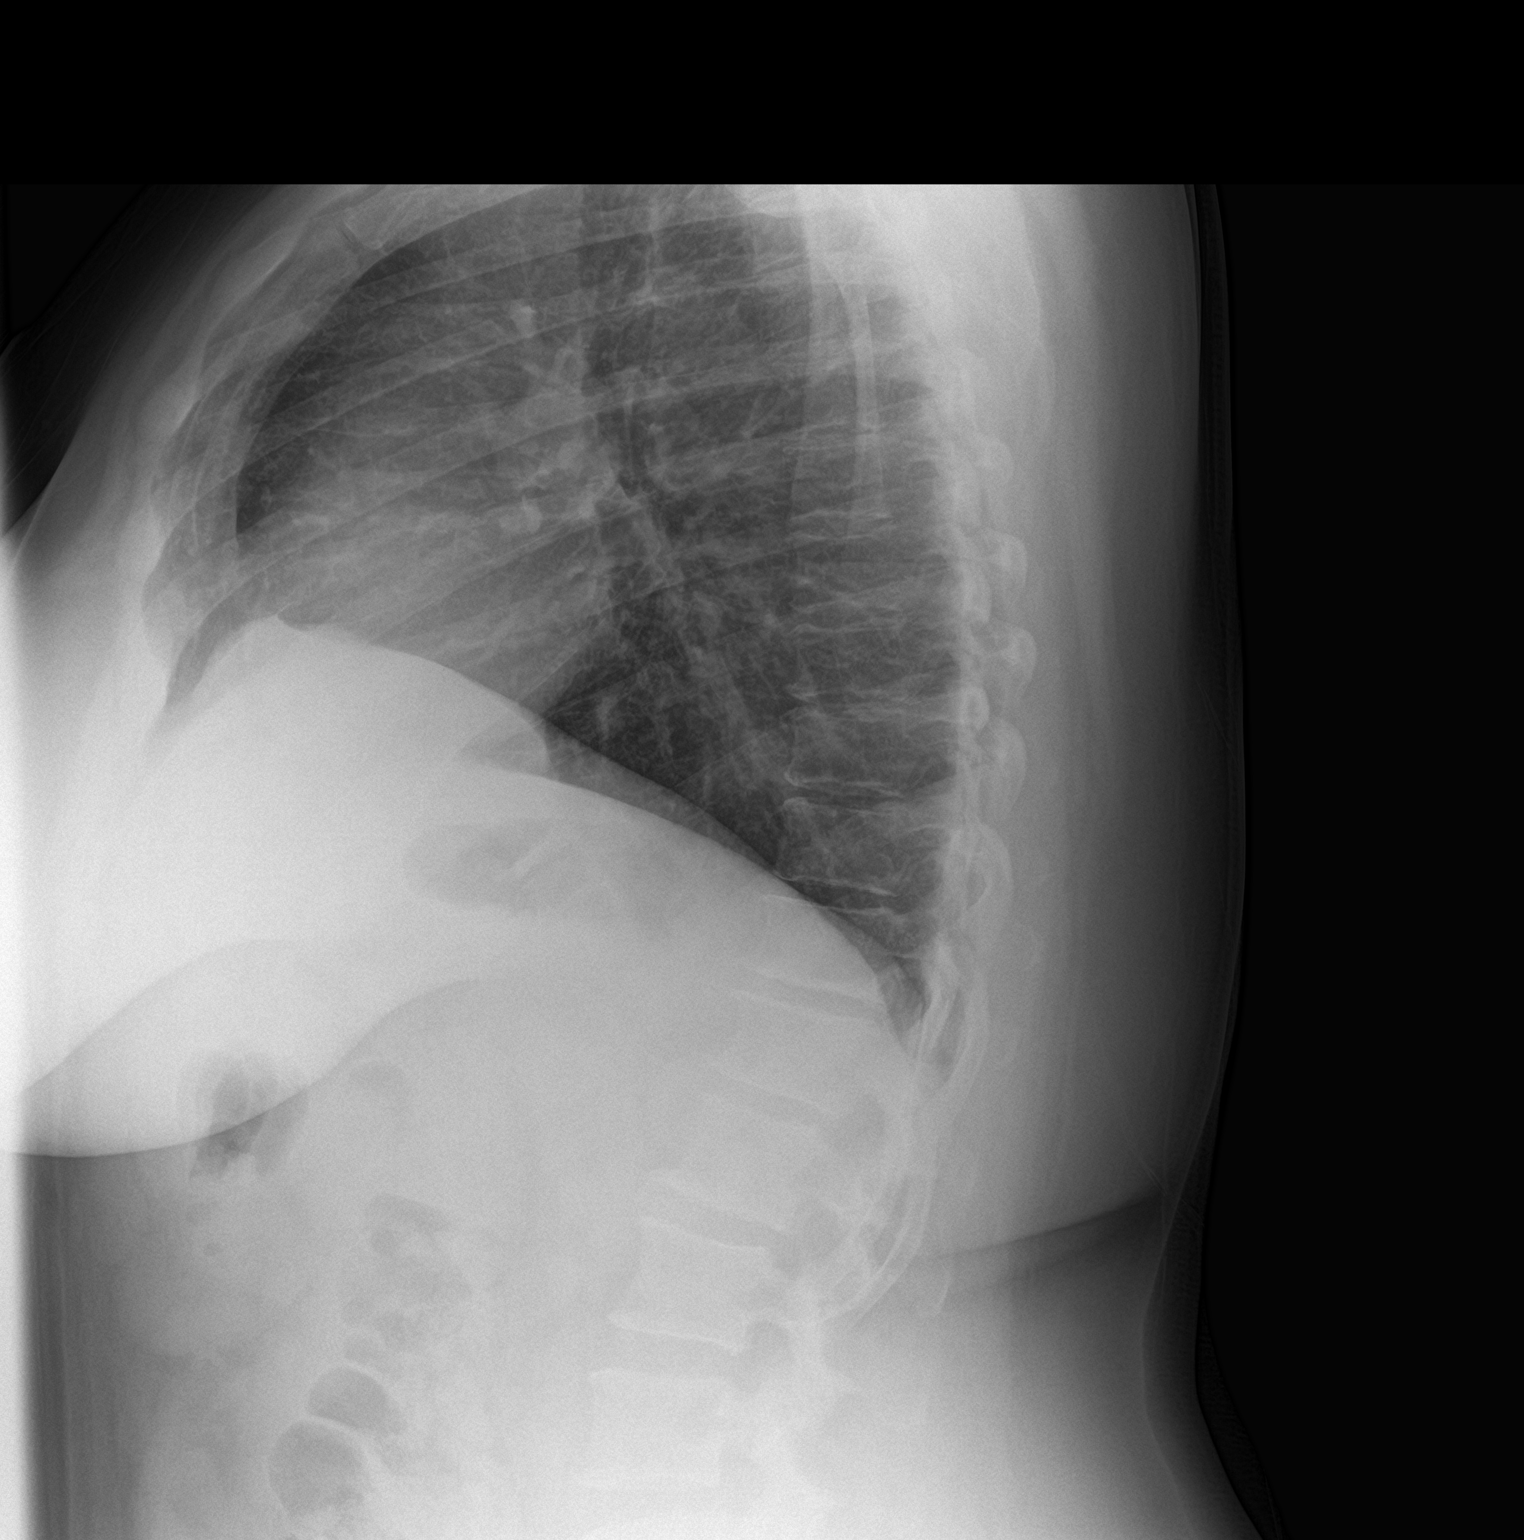

[2 of 2 positions shown; findings below may reference images not displayed]

FINDINGS: Normal heart size, mediastinal contours, and pulmonary vascularity.

Mild chronic bronchitic changes.

Lungs otherwise clear.

No pleural effusion or pneumothorax.

Bones unremarkable.
IMPRESSION: Mild chronic bronchitic changes without infiltrate.

## 2018-12-21 MED ORDER — KETOROLAC TROMETHAMINE 30 MG/ML IJ SOLN
30.0000 mg | Freq: Once | INTRAMUSCULAR | Status: AC
  Administered 2018-12-21: 30 mg via INTRAMUSCULAR
  Filled 2018-12-21: qty 1

## 2018-12-21 MED ORDER — MELOXICAM 15 MG PO TABS: 15.0000 mg | ORAL_TABLET | Freq: Every day | ORAL | 0 refills | Status: DC

## 2018-12-21 NOTE — ED Provider Notes (Signed)
[facility] Emergency Department Provider Note  ____________________________________________  Time seen: Approximately 10:22 PM  I have reviewed the triage vital signs and the nursing notes.   HISTORY  Chief Complaint Ankle Pain    HPI Brenda [name] is a 48 y.o. female who presents the emergency department complaining of intermittent pain, swelling to the left ankle.  Patient reports that she had a previous injury and has had intermittent pain and swelling since this injury.  Patient did not require any surgery for previous injury that occurred years ago.  Patient states that she did have a fall several weeks prior and symptoms began afterwards but per not attributed directly to this injury.  Patient denies any pain extending into the calf.  She states that the pain is primarily on the bilateral posterior aspects of the ankle.  No other injury or complaint at this time.         Past Medical History:  Diagnosis Date  . Ankle fracture, left    in past.  . Anxiety   . Arthritis    Right hip, scheduled for replacement 7/13/29m, left ankle  . Asthma   . Bipolar disorder ([place])   . Chronic hepatitis C ([place])   . Depression   . Dyspnea    occasional with exertion  . GERD (gastroesophageal reflux disease)    pmh  . Headache   . Hearing loss    some loss in both ears - no dx - per patient "runs in family", no hearing aids  . Hypertension   . Lump in female breast    right  . Pneumonia    x 1  . Polysubstance abuse ([place])   . Primary localized osteoarthritis of hip    right  . SVD (spontaneous vaginal delivery)    x 3  . Uses roller walker    occasional  . Wears dentures    full upper    Patient Active Problem List   Diagnosis Date Noted  . Chest tightness 12/20/2018  . Cough 12/20/2018  . History of hepatitis 10/26/2018  . Urinary tract infection symptoms 10/26/2018  . Sleep apnea 10/26/2018  . Back pain 10/26/2018  . Abnormal EKG  09/22/2018  . History of asthma 09/22/2018  . Right hip pain 09/22/2018  . Status post right hip replacement 09/13/2018  . Leg edema 08/22/2018  . Primary osteoarthritis of right hip 07/27/2018  . Vaginal discharge 07/14/2018  . Sepsis ([place]) 04/30/2018  . Opioid use disorder, moderate, dependence ([place]) 05/02/2016  . Tobacco use disorder 05/02/2016  . Severe recurrent major depression without psychotic features ([place]) 05/01/2016  . Cocaine use disorder, moderate, dependence ([place]) 05/01/2016  . Alcohol abuse 05/01/2016  . GERD (gastroesophageal reflux disease) 05/01/2016    Past Surgical History:  Procedure Laterality Date  . TOTAL HIP ARTHROPLASTY Right 09/13/2018  . TOTAL HIP ARTHROPLASTY Right 09/13/2018   Procedure: RIGHT TOTAL HIP ARTHROPLASTY ANTERIOR APPROACH;  Surgeon: Leandrew Koyanagi, MD;  Location: [place];  Service: Orthopedics;  Laterality: Right;  . TUBAL LIGATION      Prior to Admission medications   Medication Sig Start Date End Date Taking? Authorizing Provider  aspirin 81 MG chewable tablet Chew 81 mg by mouth daily.    [provider]  diltiazem (CARDIZEM CD) 120 MG 24 hr capsule Take 1 capsule (120 mg total) by mouth daily. 12/20/18   Iloabachie, Chioma E, NP  Fluticasone-Salmeterol (ADVAIR) 100-50 MCG/DOSE AEPB Inhale 1 puff into the lungs 2 (  two) times daily. 09/22/18   Iloabachie, Chioma E, NP  meloxicam (MOBIC) 15 MG tablet Take 1 tablet (15 mg total) by mouth daily. 12/21/18   Cuthriell, Delorise Royals, PA-C    Allergies Flexeril [cyclobenzaprine] and Seroquel [quetiapine fumarate]  Family History  Problem Relation Age of Onset  . Breast cancer Sister   . Ovarian cancer Neg Hx   . Colon cancer Neg Hx     Social History Social History   Tobacco Use  . Smoking status: Current Every Day Smoker    Packs/day: 1.00    Years: 31.00    Pack years: 31.00    Types: Cigarettes  . Smokeless tobacco: Never Used  . Tobacco comment: since age 32  Substance  Use Topics  . Alcohol use: Yes    Comment: rare  . 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 Last cocaine 08/02/18     Review of Systems  Constitutional: No fever/chills Eyes: No visual changes. No discharge ENT: No upper respiratory complaints. Cardiovascular: no chest pain. Respiratory: no cough. No SOB. Gastrointestinal: No abdominal pain.  No nausea, no vomiting.  No diarrhea.  No constipation. Musculoskeletal: Left ankle pain, swelling Skin: Negative for rash, abrasions, lacerations, ecchymosis. Neurological: Negative for headaches, focal weakness or numbness. 10-point ROS otherwise negative.  ____________________________________________   PHYSICAL EXAM:  VITAL SIGNS: ED Triage Vitals  Enc Vitals Group     BP 12/21/18 2003 (!) 144/85     Pulse Rate 12/21/18 2003 (!) 116     Resp 12/21/18 2003 20     Temp 12/21/18 2003 98.2 F (36.8 C)     Temp Source 12/21/18 2003 Oral     SpO2 12/21/18 2003 98 %     Weight 12/21/18 2002 215 lb (97.5 kg)     Height 12/21/18 2002 5\' 2"  (1.575 m)     Head Circumference --      Peak Flow --      Pain Score 12/21/18 2002 8     Pain Loc --      Pain Edu? --      Excl. in GC? --      Constitutional: Alert and oriented. Well appearing and in no acute distress. Eyes: Conjunctivae are normal. PERRL. EOMI. Head: Atraumatic. ENT:      Ears:       Nose: No congestion/rhinnorhea.      Mouth/Throat: Mucous membranes are moist.  Neck: No stridor.    Cardiovascular: Normal rate, regular rhythm. Normal S1 and S2.  Good peripheral circulation. Respiratory: Normal respiratory effort without tachypnea or retractions. Lungs CTAB. Good air entry to the bases with no decreased or absent breath sounds. Musculoskeletal: Full range of motion to all extremities. No gross deformities appreciated.  Visualization of the left ankle reveals minimal point specific edema in  the posterior lateral aspect of the ankle between the malleolus and Achilles tendon region.  No erythema.  No edema noted to the remainder of the left foot, ankle or lower extremity.  Good range of motion.  Patient is ambulatory at this time.  Patient is tender to palpation posterior to both malleolus is but not over the Achilles tendon.  No other tenderness to palpation.  Palpation reveals intact Achilles tendon.  Dorsalis pedis pulse intact.  Sensation intact all digits. Neurologic:  Normal speech and language. No gross focal neurologic deficits are appreciated.  Skin:  Skin is warm, dry and intact. No rash noted. Psychiatric: Mood and affect are normal. Speech and behavior are normal. Patient exhibits appropriate insight and judgement.   ____________________________________________   LABS (all labs ordered are listed, but only abnormal results are displayed)  Labs Reviewed - No data to display ____________________________________________  EKG   ____________________________________________  RADIOLOGY I personally viewed and evaluated these images as part of my medical decision making, as well as reviewing the written report by the radiologist.  Dg Chest 2 View  Result Date: 12/21/2018 CLINICAL DATA:  Chest tightness EXAM: CHEST - 2 VIEW COMPARISON:  09/03/2018 FINDINGS: Normal heart size, mediastinal contours, and pulmonary vascularity. Mild chronic bronchitic changes. Lungs otherwise clear. No pleural effusion or pneumothorax. Bones unremarkable. IMPRESSION: Mild chronic bronchitic changes without infiltrate. Electronically Signed   By: Ulyses SouthwardMark  Boles M.D.   On: 12/21/2018 18:01   Dg Ankle Complete Left  Result Date: 12/21/2018 CLINICAL DATA:  Left ankle pain EXAM: LEFT ANKLE COMPLETE - 3+ VIEW COMPARISON:  08/24/2018 FINDINGS: There is no evidence of fracture, dislocation, or joint effusion. There is no evidence of arthropathy or other focal bone abnormality. Soft tissues are  unremarkable. IMPRESSION: Negative. Electronically Signed   By: Deatra RobinsonKevin  Herman M.D.   On: 12/21/2018 21:56    ____________________________________________    PROCEDURES  Procedure(s) performed:    Procedures    Medications  ketorolac (TORADOL) 30 MG/ML injection 30 mg (has no administration in time range)     ____________________________________________   INITIAL IMPRESSION / ASSESSMENT AND PLAN / ED COURSE  Pertinent labs & imaging results that were available during my care of the patient were reviewed by me and considered in my medical decision making (see chart for details).  Review of the [place] CSRS was performed in accordance of the [facility] prior to dispensing any controlled drugs.           Patient's diagnosis is consistent with left ankle pain.  Patient presented to emergency department with acute on chronic left ankle pain and swelling.  No significant edema on exam.  X-ray revealed no acute osseous abnormality.  Suspect this is acute worsening of a chronic problem.  It appears the patient likely has tendinitis in her ankle.  Patient will be placed on anti-inflammatories at this time for pain relief and symptom control.  Patient is encouraged to use a brace to further assist in chronic issues with patient's ankle.  Follow-up with orthopedics as needed..Patient is given ED precautions to return to the ED for any worsening or new symptoms.     ____________________________________________  FINAL CLINICAL IMPRESSION(S) / ED DIAGNOSES  Final diagnoses:  Left ankle tendinitis      NEW MEDICATIONS STARTED DURING THIS VISIT:  ED Discharge Orders         Ordered    meloxicam (MOBIC) 15 MG tablet  Daily     12/21/18 2225              This chart was dictated using voice recognition software/Dragon. Despite best efforts to proofread, errors can occur which can change the meaning. Any change was purely unintentional.    Racheal PatchesCuthriell, Jonathan D, PA-C 12/21/18  2226    Dionne BucySiadecki, Sebastian, MD 12/21/18 2239

## 2018-12-21 NOTE — ED Triage Notes (Signed)
Pt reporting left ankle pain for about 3-4 days, denies specific injury. Lateral left ankle swelling.

## 2018-12-21 NOTE — Discharge Instructions (Addendum)
Acquire an ASO lace up stirrup ankle brace for further improvement of ankle pain.

## 2018-12-22 ENCOUNTER — Other Ambulatory Visit: Payer: Medicaid Other

## 2018-12-22 ENCOUNTER — Other Ambulatory Visit: Payer: Self-pay

## 2018-12-22 DIAGNOSIS — R059 Cough, unspecified: Secondary | ICD-10-CM

## 2018-12-22 DIAGNOSIS — R399 Unspecified symptoms and signs involving the genitourinary system: Secondary | ICD-10-CM

## 2018-12-22 DIAGNOSIS — R9431 Abnormal electrocardiogram [ECG] [EKG]: Secondary | ICD-10-CM

## 2018-12-22 DIAGNOSIS — Z0000 Encounter for general adult medical examination without abnormal findings: Secondary | ICD-10-CM

## 2018-12-22 DIAGNOSIS — R05 Cough: Secondary | ICD-10-CM

## 2018-12-27 LAB — COMPREHENSIVE METABOLIC PANEL
ALT: 88 IU/L — ABNORMAL HIGH (ref 0–32)
AST: 67 IU/L — ABNORMAL HIGH (ref 0–40)
Albumin/Globulin Ratio: 1.3 (ref 1.2–2.2)
Albumin: 3.9 g/dL (ref 3.8–4.8)
Alkaline Phosphatase: 133 IU/L — ABNORMAL HIGH (ref 39–117)
BUN/Creatinine Ratio: 12 (ref 9–23)
BUN: 11 mg/dL (ref 6–24)
Bilirubin Total: 0.2 mg/dL (ref 0.0–1.2)
CO2: 22 mmol/L (ref 20–29)
Calcium: 9.1 mg/dL (ref 8.7–10.2)
Chloride: 103 mmol/L (ref 96–106)
Creatinine, Ser: 0.91 mg/dL (ref 0.57–1.00)
GFR calc Af Amer: 88 mL/min/{1.73_m2} (ref >59)
GFR calc non Af Amer: 76 mL/min/{1.73_m2} (ref >59)
Globulin, Total: 3 g/dL (ref 1.5–4.5)
Glucose: 78 mg/dL (ref 65–99)
Potassium: 4.5 mmol/L (ref 3.5–5.2)
Sodium: 139 mmol/L (ref 134–144)
Total Protein: 6.9 g/dL (ref 6.0–8.5)

## 2018-12-27 LAB — URINE CULTURE, REFLEX

## 2018-12-27 LAB — CBC WITH DIFFERENTIAL/PLATELET
Basophils Absolute: 0.1 10*3/uL (ref 0.0–0.2)
Basos: 0 %
EOS (ABSOLUTE): 0 10*3/uL (ref 0.0–0.4)
Eos: 0 %
Hematocrit: 40.1 % (ref 34.0–46.6)
Hemoglobin: 12.8 g/dL (ref 11.1–15.9)
Immature Grans (Abs): 0 10*3/uL (ref 0.0–0.1)
Immature Granulocytes: 0 %
Lymphocytes Absolute: 2.9 10*3/uL (ref 0.7–3.1)
Lymphs: 22 %
MCH: 22.7 pg — ABNORMAL LOW (ref 26.6–33.0)
MCHC: 31.9 g/dL (ref 31.5–35.7)
MCV: 71 fL — ABNORMAL LOW (ref 79–97)
Monocytes Absolute: 1 10*3/uL — ABNORMAL HIGH (ref 0.1–0.9)
Monocytes: 7 %
Neutrophils Absolute: 9.1 10*3/uL — ABNORMAL HIGH (ref 1.4–7.0)
Neutrophils: 71 %
Platelets: 341 10*3/uL (ref 150–450)
RBC: 5.63 x10E6/uL — ABNORMAL HIGH (ref 3.77–5.28)
RDW: 19.7 % — ABNORMAL HIGH (ref 11.7–15.4)
WBC: 13 10*3/uL — ABNORMAL HIGH (ref 3.4–10.8)

## 2018-12-27 LAB — UA/M W/RFLX CULTURE, ROUTINE
Bilirubin, UA: NEGATIVE
Glucose, UA: NEGATIVE
Ketones, UA: NEGATIVE
Nitrite, UA: NEGATIVE
Protein,UA: NEGATIVE
RBC, UA: NEGATIVE
Specific Gravity, UA: 1.017 (ref 1.005–1.030)
Urobilinogen, Ur: 0.2 mg/dL (ref 0.2–1.0)
pH, UA: 6 (ref 5.0–7.5)

## 2018-12-27 LAB — MICROSCOPIC EXAMINATION
Bacteria, UA: NONE SEEN
Casts: NONE SEEN /lpf

## 2018-12-27 LAB — HEMOGLOBIN A1C
Est. average glucose Bld gHb Est-mCnc: 105 mg/dL
Hgb A1c MFr Bld: 5.3 % (ref 4.8–5.6)

## 2018-12-27 LAB — LIPID PANEL
Chol/HDL Ratio: 4.1 ratio (ref 0.0–4.4)
Cholesterol, Total: 149 mg/dL (ref 100–199)
HDL: 36 mg/dL — ABNORMAL LOW (ref >39)
LDL Chol Calc (NIH): 94 mg/dL (ref 0–99)
Triglycerides: 99 mg/dL (ref 0–149)
VLDL Cholesterol Cal: 19 mg/dL (ref 5–40)

## 2018-12-29 ENCOUNTER — Ambulatory Visit
Admission: RE | Admit: 2018-12-29 | Discharge: 2018-12-29 | Disposition: A | Discharge status: Home / Self Care | Payer: Medicaid Other | Source: Ambulatory Visit | Attending: Obstetrics and Gynecology | Admitting: Obstetrics and Gynecology

## 2018-12-29 DIAGNOSIS — N6489 Other specified disorders of breast: Secondary | ICD-10-CM

## 2018-12-29 IMAGING — MG DIGITAL DIAGNOSTIC BILAT W/ TOMO W/ CAD
8 series · 8 of 24 positions shown · non-contrast
Comparison: Previous exam dated [DATE] [DATE] and
[DATE].

CLINICAL DATA: Patient had a diagnostic study performed on
where a probable benign mass was seen on ultrasound at 6 o'clock 1
cm from the nipple in the right breast. Delayed follow-up.

EXAM:
DIGITAL DIAGNOSTIC BILATERAL MAMMOGRAM WITH CAD AND TOMO
ULTRASOUND RIGHT BREAST

[R CC synth-2D]
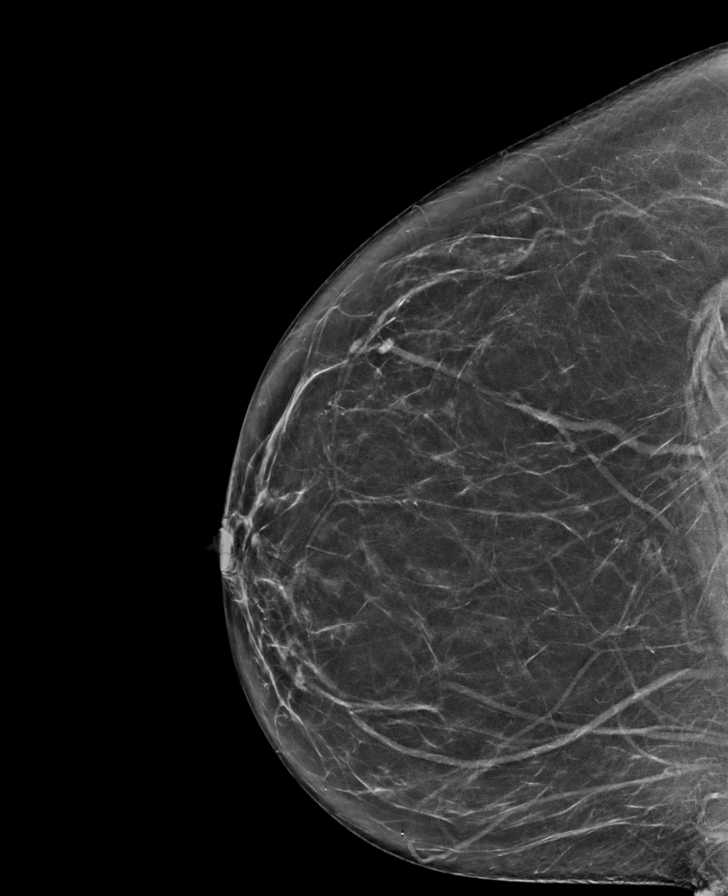

[L CC synth-2D]
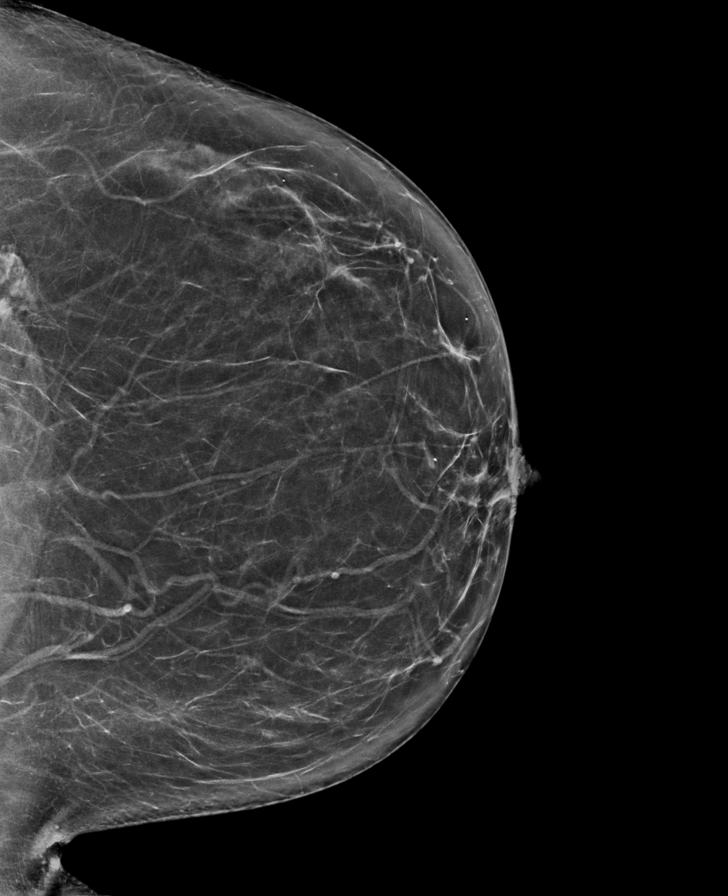

[L MLO synth-2D]
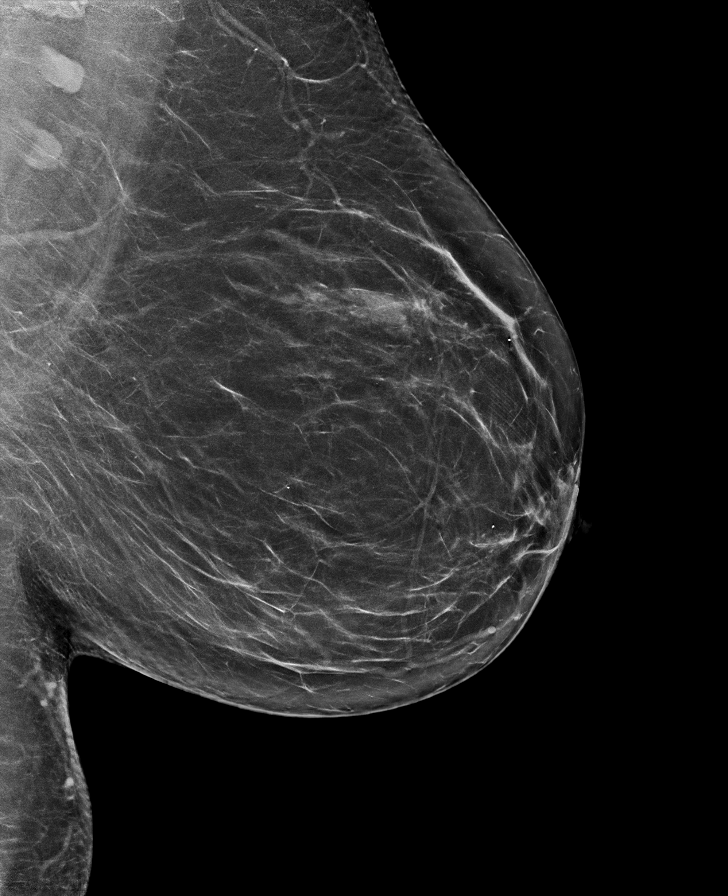

[R MLO synth-2D]
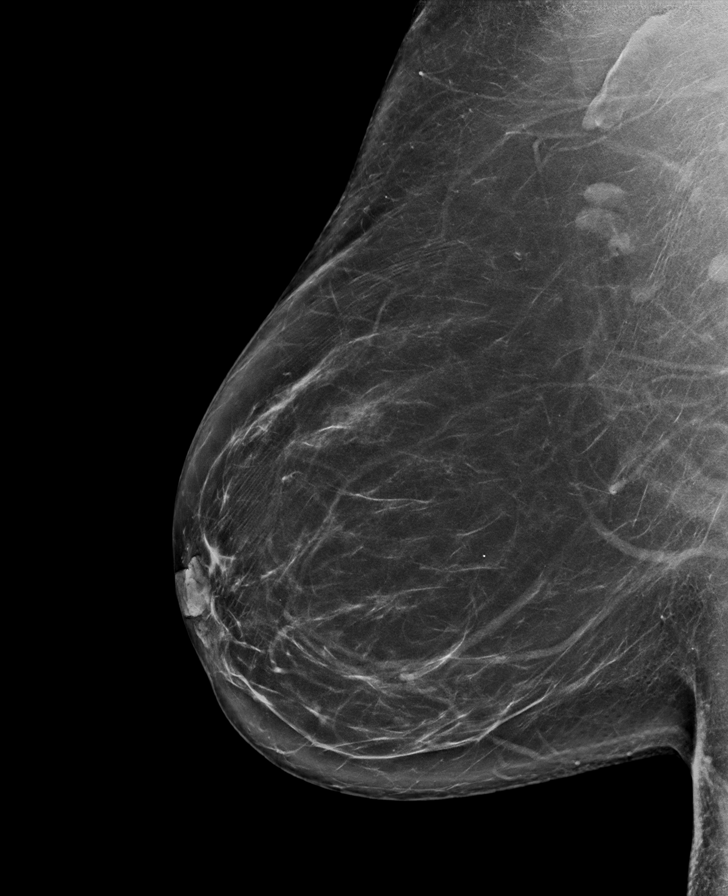

[L MLO tomo · tomo slice 41/82.0]
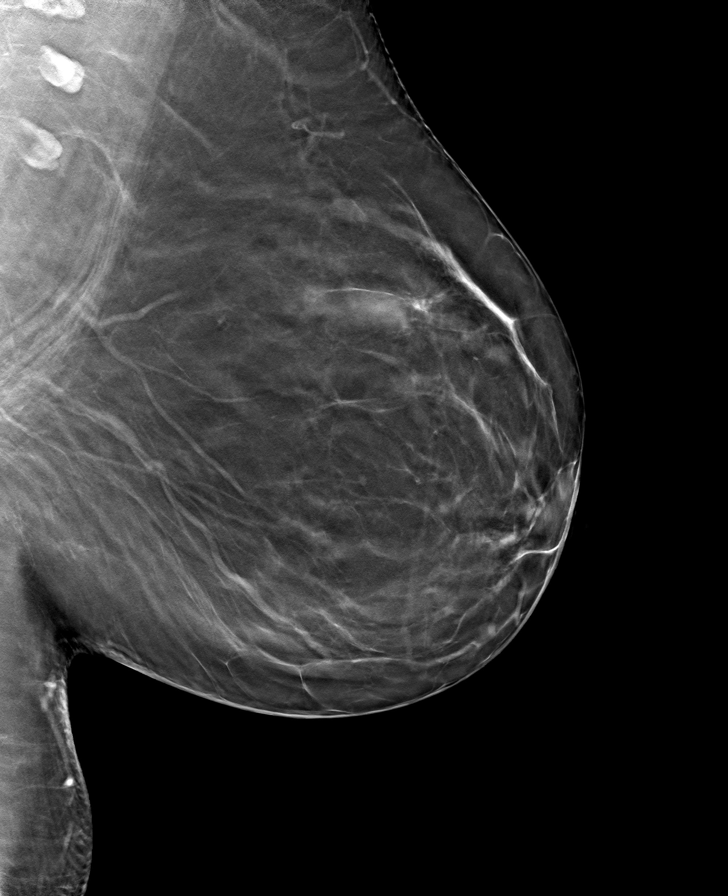

[R MLO tomo · tomo slice 47/92.0]
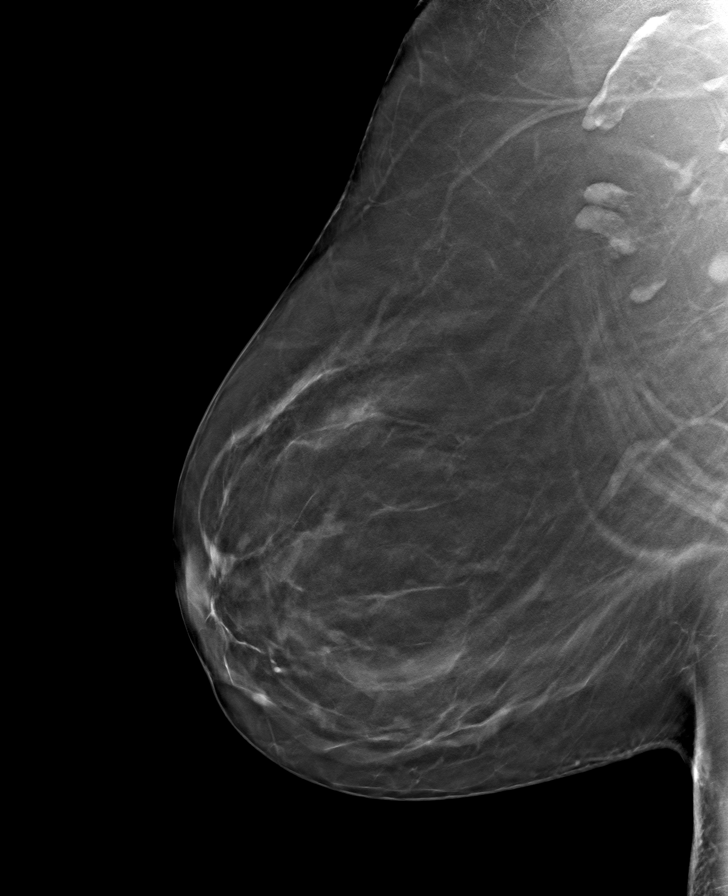

[L CC tomo · tomo slice 37/74.0]
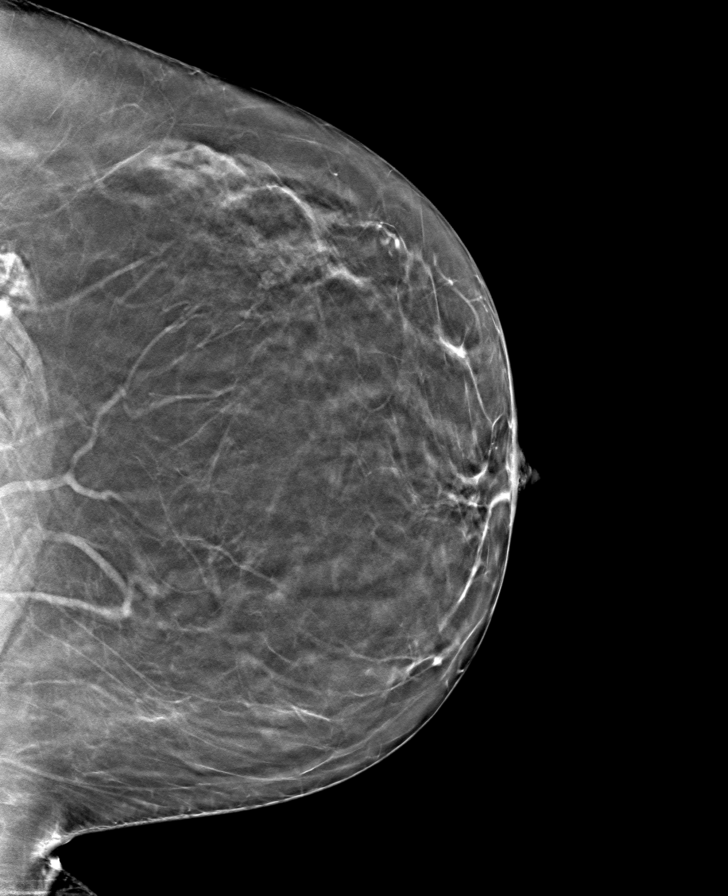

[R CC tomo · tomo slice 37/73.0]
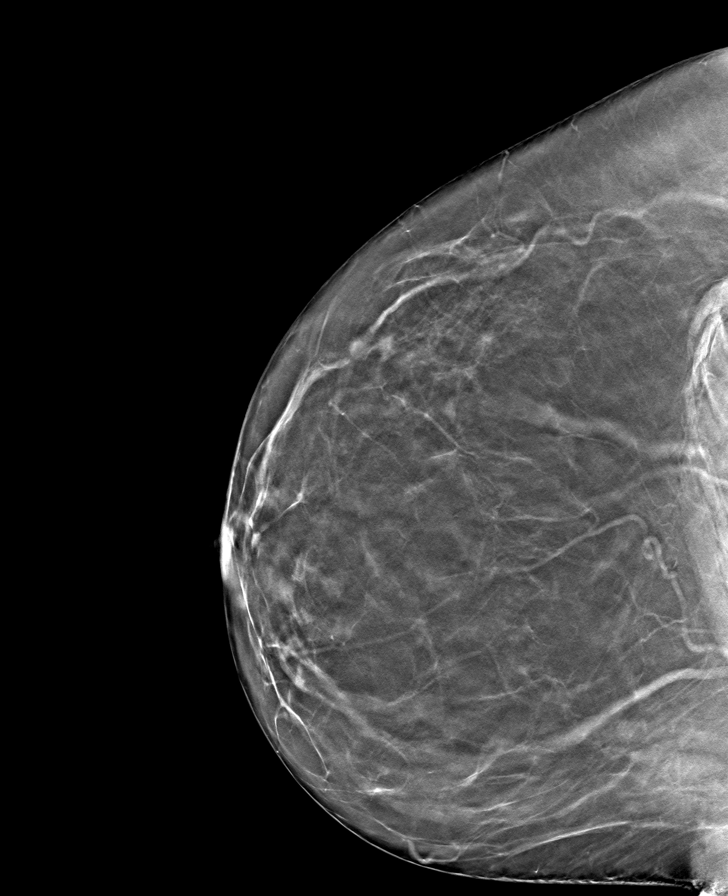

[8 of 24 positions shown; findings below may reference images not displayed]

The prior ultrasound dated [DATE] is not available
for comparison.

ACR Breast Density Category b: There are scattered areas of
fibroglandular density.
FINDINGS: No suspicious mass, malignant type microcalcifications or distortion
detected in either breast.

Mammographic images were processed with CAD.

On physical exam, I do not palpate a mass in the right breast at 6
o'clock 1 cm from the nipple.

Targeted ultrasound is performed, showing a probable benign cluster
of cysts (apocrine metaplasia) at 6 o'clock 1 cm from the nipple
measuring 5 x 4 x 5 mm.
IMPRESSION: Probable benign apocrine metaplasia in the right breast.

RECOMMENDATION:
Short-term interval follow-up right breast ultrasound in 6 months is
recommended.

I have discussed the findings and recommendations with the patient.
If applicable, a reminder letter will be sent to the patient
regarding the next appointment.

BI-RADS CATEGORY  3: Probably benign.

## 2018-12-29 IMAGING — US US BREAST*R* LIMITED INC AXILLA
1 series · 7 of 7 positions shown · non-contrast
Comparison: Previous exam dated [DATE] [DATE] and
[DATE].

CLINICAL DATA: Patient had a diagnostic study performed on
where a probable benign mass was seen on ultrasound at 6 o'clock 1
cm from the nipple in the right breast. Delayed follow-up.

EXAM:
DIGITAL DIAGNOSTIC BILATERAL MAMMOGRAM WITH CAD AND TOMO
ULTRASOUND RIGHT BREAST

[Series 1: us breast*right* limited inc axilla · 0.06mm/px · 7 of 7 slices shown]
[im 1/7]
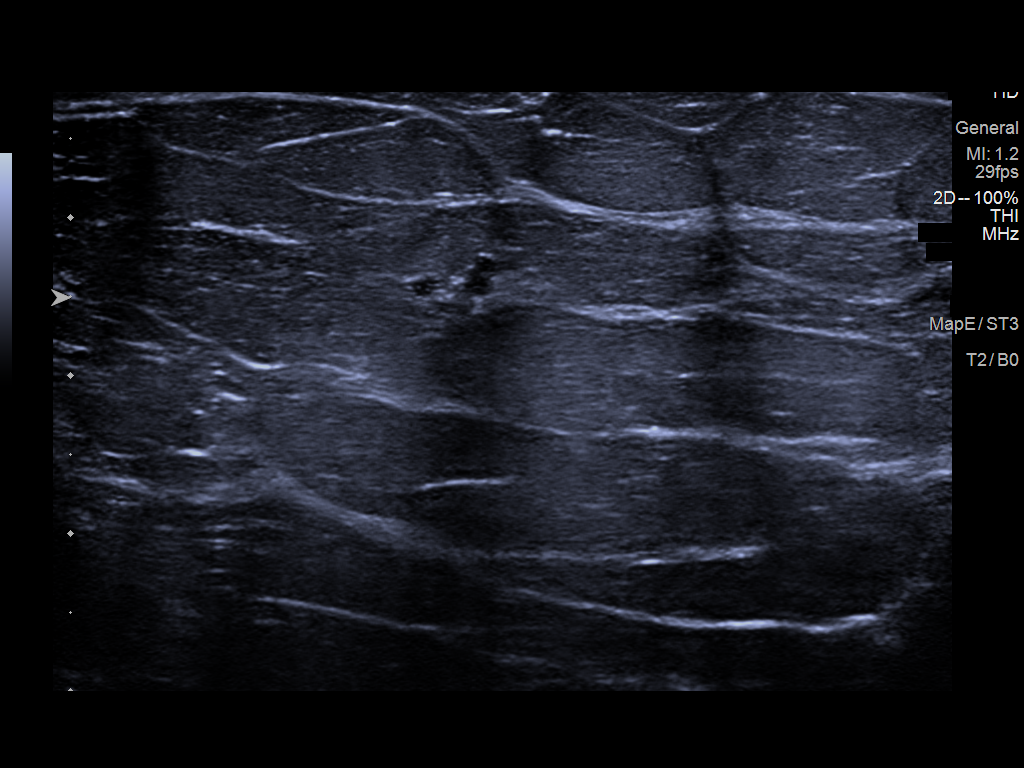
[im 2/7]
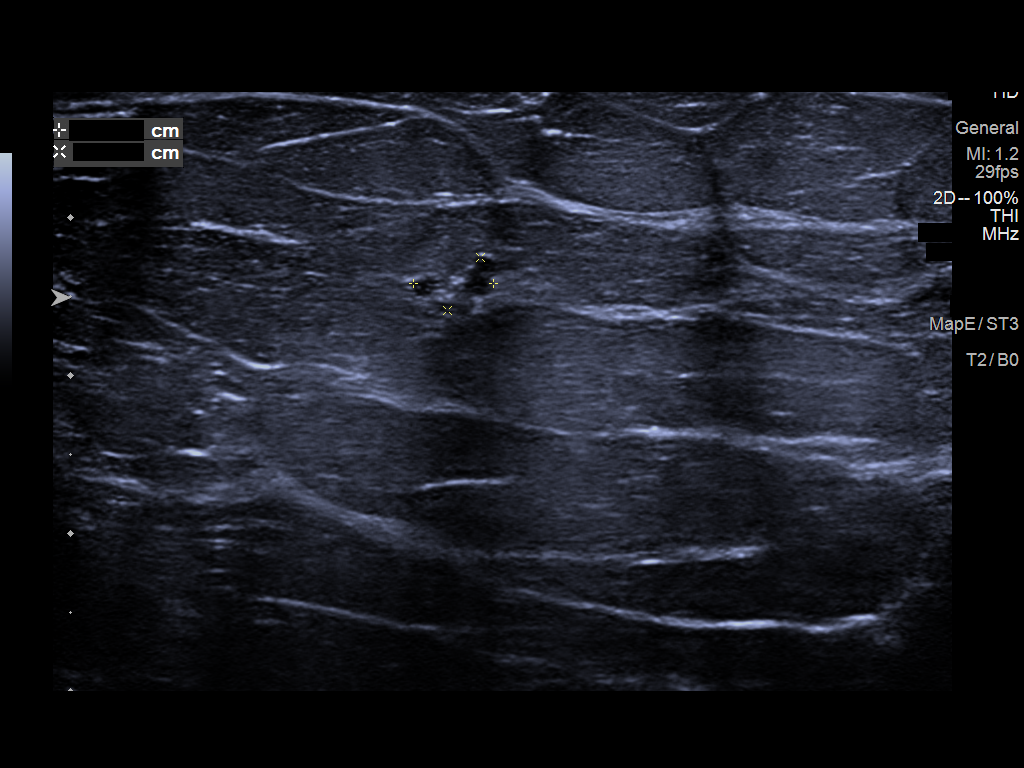
[im 3/7]
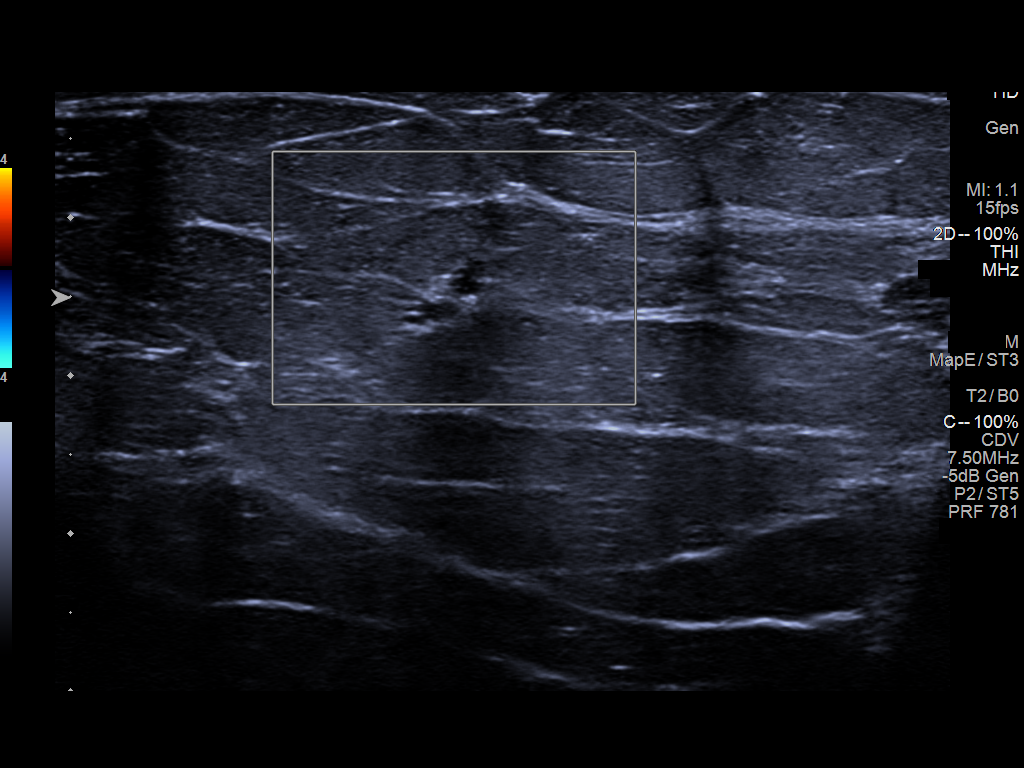
[im 4/7]
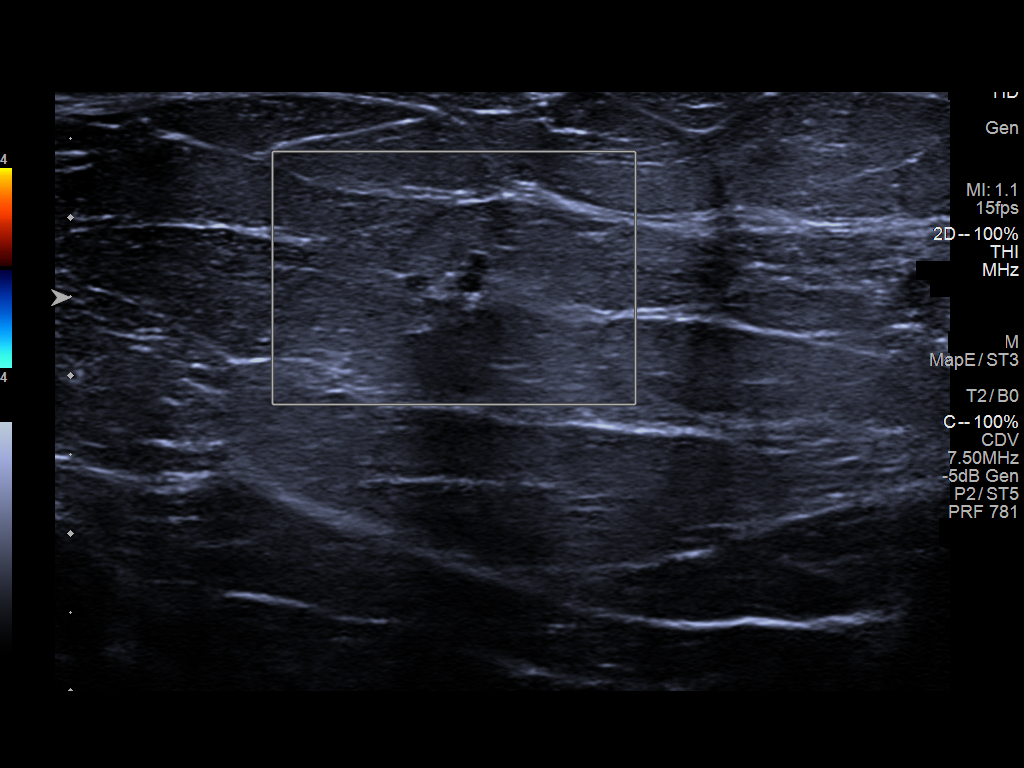
[im 5/7]
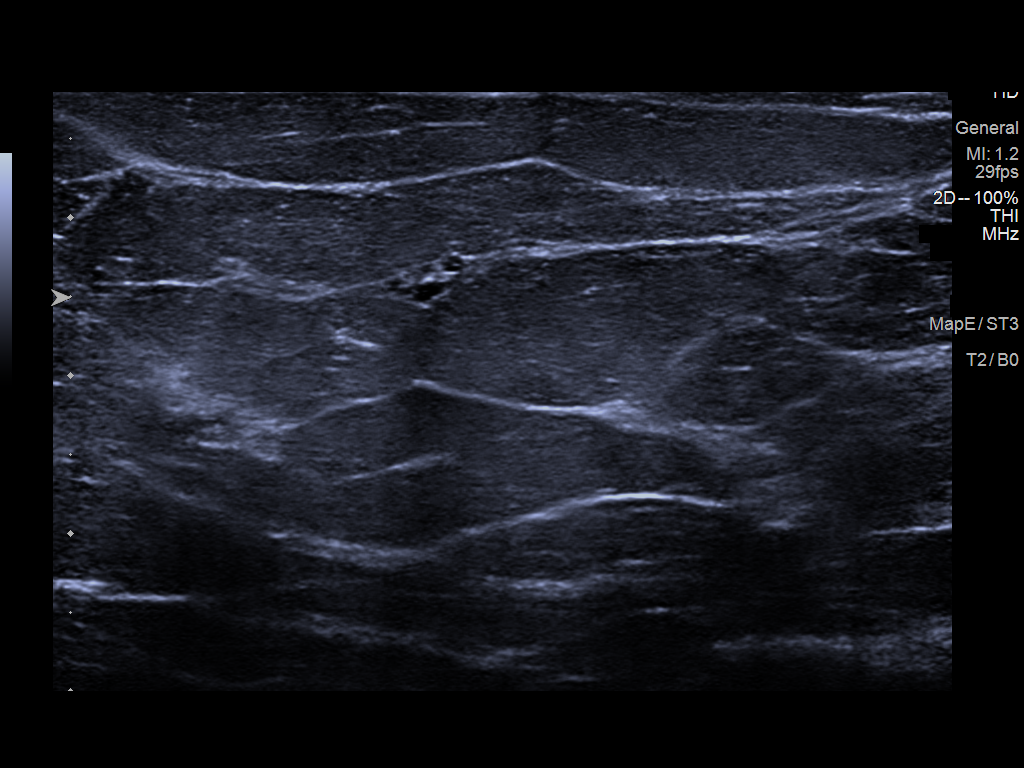
[im 6/7]
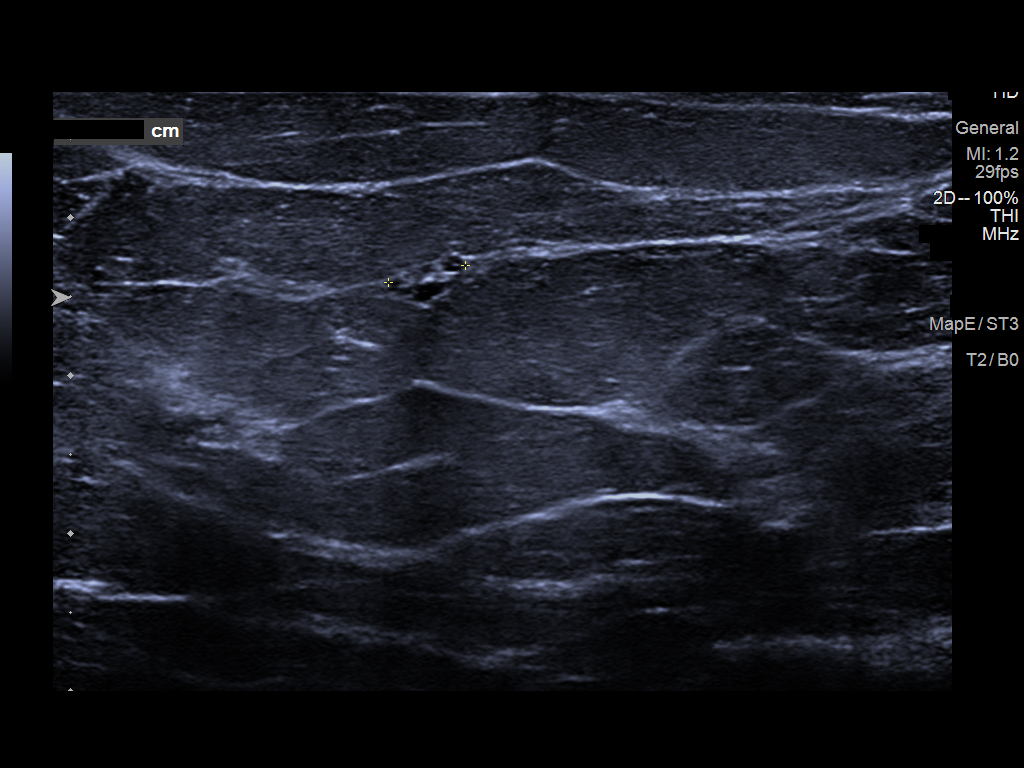
[im 7/7]
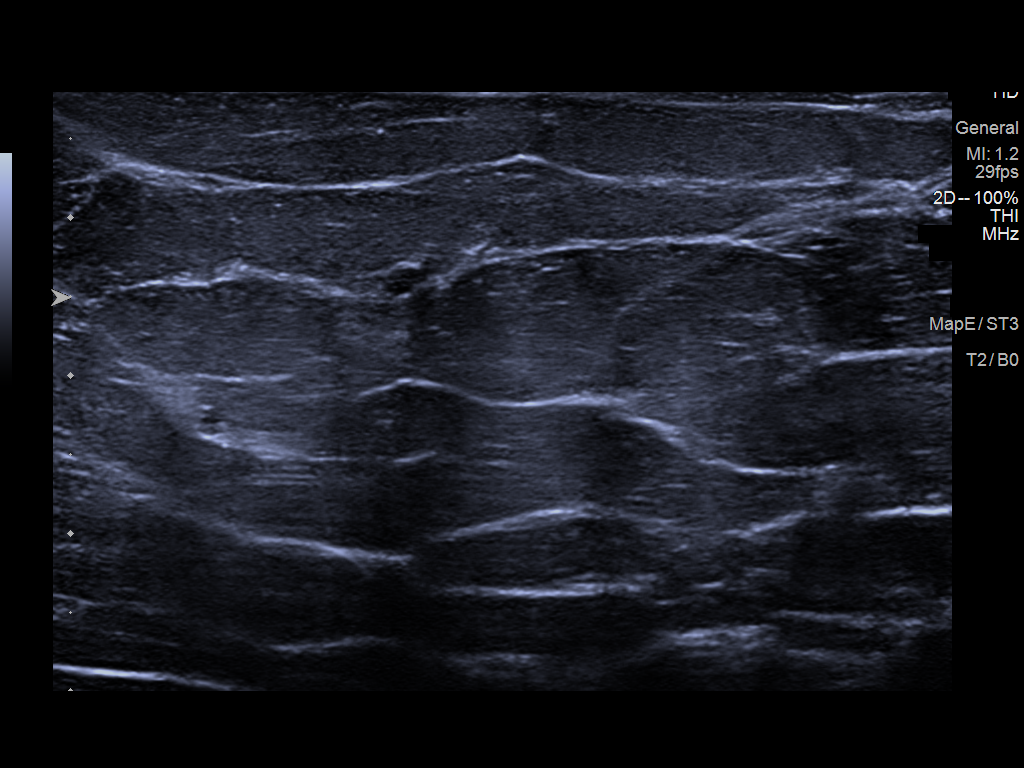

[7 of 7 positions shown; findings below may reference images not displayed]

The prior ultrasound dated [DATE] is not available
for comparison.

ACR Breast Density Category b: There are scattered areas of
fibroglandular density.
FINDINGS: No suspicious mass, malignant type microcalcifications or distortion
detected in either breast.

Mammographic images were processed with CAD.

On physical exam, I do not palpate a mass in the right breast at 6
o'clock 1 cm from the nipple.

Targeted ultrasound is performed, showing a probable benign cluster
of cysts (apocrine metaplasia) at 6 o'clock 1 cm from the nipple
measuring 5 x 4 x 5 mm.
IMPRESSION: Probable benign apocrine metaplasia in the right breast.

RECOMMENDATION:
Short-term interval follow-up right breast ultrasound in 6 months is
recommended.

I have discussed the findings and recommendations with the patient.
If applicable, a reminder letter will be sent to the patient
regarding the next appointment.

BI-RADS CATEGORY  3: Probably benign.

## 2019-01-03 ENCOUNTER — Institutional Professional Consult (permissible substitution): Payer: Medicaid Other | Admitting: Neurology

## 2019-01-03 ENCOUNTER — Telehealth: Payer: Self-pay | Admitting: Neurology

## 2019-01-03 ENCOUNTER — Encounter: Payer: Self-pay | Admitting: Neurology

## 2019-01-03 NOTE — Telephone Encounter (Signed)
Robin please dismiss this pt

## 2019-01-03 NOTE — Telephone Encounter (Signed)
This is 2nd new patient no show for this patient. Please dismiss per protocol

## 2019-01-10 ENCOUNTER — Encounter: Payer: Self-pay | Admitting: *Deleted

## 2019-01-10 ENCOUNTER — Ambulatory Visit: Payer: Self-pay | Admitting: Gastroenterology

## 2019-01-18 ENCOUNTER — Ambulatory Visit: Payer: Medicaid Other | Admitting: Gerontology

## 2019-01-24 ENCOUNTER — Ambulatory Visit: Payer: Medicaid Other | Admitting: Gerontology

## 2019-01-24 ENCOUNTER — Other Ambulatory Visit: Payer: Self-pay

## 2019-01-24 DIAGNOSIS — J029 Acute pharyngitis, unspecified: Secondary | ICD-10-CM

## 2019-01-24 DIAGNOSIS — R0602 Shortness of breath: Secondary | ICD-10-CM | POA: Insufficient documentation

## 2019-01-24 DIAGNOSIS — R05 Cough: Secondary | ICD-10-CM | POA: Insufficient documentation

## 2019-01-24 DIAGNOSIS — R059 Cough, unspecified: Secondary | ICD-10-CM

## 2019-01-24 MED ORDER — GUAIFENESIN 100 MG/5ML PO SYRP: 200.0000 mg | ORAL_SOLUTION | Freq: Three times a day (TID) | ORAL | 0 refills | Status: DC | PRN

## 2019-01-24 MED ORDER — ALBUTEROL SULFATE HFA 108 (90 BASE) MCG/ACT IN AERS: 2.0000 | INHALATION_SPRAY | Freq: Four times a day (QID) | RESPIRATORY_TRACT | 2 refills | Status: DC | PRN

## 2019-01-24 MED ORDER — BENZONATATE 100 MG PO CAPS: 100.0000 mg | ORAL_CAPSULE | Freq: Three times a day (TID) | ORAL | 0 refills | Status: DC | PRN

## 2019-01-24 NOTE — Progress Notes (Signed)
Established Patient Office Visit  Subjective:  Patient ID: Brenda [name], female    DOB: [birth year]  Age: 48 y.o. MRN: [id number]  CC: No chief complaint on file. Patient consents to telephone visit, and 2 patient identifiers was used to identify patient.  HPI Brenda [name] presents for follow up post ED visit. She was seen at the ED on 12/21/2018 for left Ankle pain. Left Ankle X ray done, showed no evidence of fracture, dislocation, or joint effusion.There was no evidence of arthropathy or other focal bone abnormality, and Soft tissues are unremarkable, per Dr Ulyses Jarred. She was treated with meloxicam 15 mg and advised to use ankle brace.  She states that she continues to experience mild pain to left ankle, and she has not been using ankle brace and taking meloxicam did not relieve pain. She states that she is tolerating the left ankle pain. Currently she states that she continues to experience productive cough with thick yellowish phlegm, sore throat and intermittent shortness of breath when she walks 3-4 blocks. She states that cough wakes her up at night and taking Nyquil didn't relieve her symptoms. She continues to smoke less than one pack of cigarette daily and states that she's not ready to quit. She states that she had a Covid test done on 01/20/2019 at [facility] which was negative. She denies any fever, chills, loss of sense of taste or smell, myalgia, chest tightness, light headedness, wheezing and fatigue.  She had a chest x-ray done on 01/19/2019 and it showed Mild chronic bronchitic changes without infiltrate, per Dr Joylene Draft. She states that she's doing well and offers no further complaint.  Past Medical History:  Diagnosis Date  . Ankle fracture, left    in past.  . Anxiety   . Arthritis    Right hip, scheduled for replacement 7/13/62m, left ankle  . Asthma   . Bipolar disorder ([place])   . Chronic hepatitis C ([place])   . Depression   . Dyspnea    occasional  with exertion  . GERD (gastroesophageal reflux disease)    pmh  . Headache   . Hearing loss    some loss in both ears - no dx - per patient "runs in family", no hearing aids  . Hypertension   . Lump in female breast    right  . Pneumonia    x 1  . Polysubstance abuse ([place])   . Primary localized osteoarthritis of hip    right  . SVD (spontaneous vaginal delivery)    x 3  . Uses roller walker    occasional  . Wears dentures    full upper    Past Surgical History:  Procedure Laterality Date  . TOTAL HIP ARTHROPLASTY Right 09/13/2018  . TOTAL HIP ARTHROPLASTY Right 09/13/2018   Procedure: RIGHT TOTAL HIP ARTHROPLASTY ANTERIOR APPROACH;  Surgeon: Leandrew Koyanagi, MD;  Location: [place];  Service: Orthopedics;  Laterality: Right;  . TUBAL LIGATION      Family History  Problem Relation Age of Onset  . Ovarian cancer Neg Hx   . Colon cancer Neg Hx   . Breast cancer Neg Hx     Social History   Socioeconomic History  . Marital status: Single    Spouse name: Not on file  . Number of children: Not on file  . Years of education: Not on file  . Highest education level: Not on file  Occupational History  . Not on file  Tobacco Use  . Smoking status: Current Every Day Smoker    Packs/day: 1.00    Years: 31.00    Pack years: 31.00    Types: Cigarettes  . Smokeless tobacco: Never Used  . Tobacco comment: since age 46  Substance and Sexual Activity  . Alcohol use: Yes    Comment: rare  . 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 Last cocaine 08/02/18  . Sexual activity: Not Currently    Birth control/protection: Surgical    Comment: tubal   Other Topics Concern  . Not on file  Social History Narrative  . Not on file   Social Determinants of Health   Financial Resource Strain:   . Difficulty of Paying Living Expenses: Not on file  Food Insecurity:   . Worried About Programme researcher, broadcasting/film/video in  the Last Year: Not on file  . Ran Out of Food in the Last Year: Not on file  Transportation Needs:   . Lack of Transportation (Medical): Not on file  . Lack of Transportation (Non-Medical): Not on file  Physical Activity:   . Days of Exercise per Week: Not on file  . Minutes of Exercise per Session: Not on file  Stress:   . Feeling of Stress : Not on file  Social Connections:   . Frequency of Communication with Friends and Family: Not on file  . Frequency of Social Gatherings with Friends and Family: Not on file  . Attends Religious Services: Not on file  . Active Member of Clubs or Organizations: Not on file  . Attends Banker Meetings: Not on file  . Marital Status: Not on file  Intimate Partner Violence:   . Fear of Current or Ex-Partner: Not on file  . Emotionally Abused: Not on file  . Physically Abused: Not on file  . Sexually Abused: Not on file    Outpatient Medications Prior to Visit  Medication Sig Dispense Refill  . aspirin 81 MG chewable tablet Chew 81 mg by mouth daily.    Marland Kitchen diltiazem (CARDIZEM CD) 120 MG 24 hr capsule Take 1 capsule (120 mg total) by mouth daily. 30 capsule 3  . Fluticasone-Salmeterol (ADVAIR) 100-50 MCG/DOSE AEPB Inhale 1 puff into the lungs 2 (two) times daily. 60 each 2  . meloxicam (MOBIC) 15 MG tablet Take 1 tablet (15 mg total) by mouth daily. 30 tablet 0   No facility-administered medications prior to visit.    Allergies  Allergen Reactions  . Flexeril [Cyclobenzaprine] Other (See Comments)    RLS-like symptoms   . Seroquel [Quetiapine Fumarate] Other (See Comments)    RLS-like symptoms    ROS Review of Systems  Constitutional: Negative for chills and fever.  HENT: Positive for sore throat. Negative for sinus pressure and sinus pain.   Respiratory: Positive for cough and shortness of breath.   Cardiovascular: Negative.   Neurological: Negative.   Psychiatric/Behavioral: Negative.       Objective:    Physical  Exam No PE was done There were no vitals taken for this visit. Wt Readings from Last 3 Encounters:  12/21/18 215 lb (97.5 kg)  11/12/18 215 lb (97.5 kg)  10/26/18 213 lb (96.6 kg)     Health Maintenance Due  Topic Date Due  . PAP SMEAR-Modifier  02/08/1993    There are no preventive care reminders to display for this patient.  Lab Results  Component Value Date   TSH 1.192 04/30/2018   Lab Results  Component Value Date   WBC 13.0 (H) 12/22/2018   HGB 12.8 12/22/2018   HCT 40.1 12/22/2018   MCV 71 (L) 12/22/2018   PLT 341 12/22/2018   Lab Results  Component Value Date   NA 139 12/22/2018   K 4.5 12/22/2018   CO2 22 12/22/2018   GLUCOSE 78 12/22/2018   BUN 11 12/22/2018   CREATININE 0.91 12/22/2018   BILITOT 0.2 12/22/2018   ALKPHOS 133 (H) 12/22/2018   AST 67 (H) 12/22/2018   ALT 88 (H) 12/22/2018   PROT 6.9 12/22/2018   ALBUMIN 3.9 12/22/2018   CALCIUM 9.1 12/22/2018   ANIONGAP 8 09/14/2018   Lab Results  Component Value Date   CHOL 149 12/22/2018   Lab Results  Component Value Date   HDL 36 (L) 12/22/2018   Lab Results  Component Value Date   LDLCALC 94 12/22/2018   Lab Results  Component Value Date   TRIG 99 12/22/2018   Lab Results  Component Value Date   CHOLHDL 4.1 12/22/2018   Lab Results  Component Value Date   HGBA1C 5.3 12/22/2018      Assessment & Plan:   1. Cough - She will start Benzonatate and Guaifenesin, was educated on medication side effects. She was advised to go to the ED with worsening symptoms. - benzonatate (TESSALON PERLES) 100 MG capsule; Take 1 capsule (100 mg total) by mouth 3 (three) times daily as needed for cough.  Dispense: 20 capsule; Refill: 0 - guaifenesin (ROBITUSSIN) 100 MG/5ML syrup; Take 10 mLs (200 mg total) by mouth 3 (three) times daily as needed for cough.  Dispense: 240 mL; Refill: 0  2. Shortness of breath - She has negative COVID test, and was strongly encouraged on smoking cessation. She will  start Albuterol as needed, was educated on medication side effects and to notify clinic. She was advised to notify clinic or go to the ED with worsening symptoms. - albuterol (VENTOLIN HFA) 108 (90 Base) MCG/ACT inhaler; Inhale 2 puffs into the lungs every 6 (six) hours as needed for wheezing or shortness of breath.  Dispense: 18 g; Refill: 2  3. Sore throat Per patient, she has COVID 19 test was negative, but she was encouraged to go to the ED for worsening symptoms. She was advised to drink enough fluids to keep your pee (urine) pale yellow. -Rest when you feel you need to. -To help with pain: ? Sip warm liquids, such as broth, herbal tea, or warm water. ? Eat or drink cold or frozen liquids, such as frozen ice pops. ? Gargle with a salt-water mixture 3-4 times a day or as needed. To make a salt-water mixture, add -1 tsp (3-6 g) of salt to 1 cup (237 mL) of warm water. Mix it until you cannot see the salt anymore. ? Suck on hard candy or throat lozenges. ? Put a cool-mist humidifier in your bedroom at night. ? Sit in the bathroom with the door closed for 5-10 minutes while you run hot water in the shower.   Follow-up: Return in about 29 days (around 02/22/2019), or if symptoms worsen or fail to improve.    Kol Consuegra Trellis PaganiniE Eliasar Hlavaty, NP

## 2019-01-24 NOTE — Patient Instructions (Signed)

## 2019-02-22 ENCOUNTER — Ambulatory Visit: Payer: Medicaid Other | Admitting: Gerontology

## 2019-02-22 ENCOUNTER — Encounter: Payer: Self-pay | Admitting: Gerontology

## 2019-02-22 ENCOUNTER — Other Ambulatory Visit: Payer: Self-pay

## 2019-02-22 DIAGNOSIS — R05 Cough: Secondary | ICD-10-CM

## 2019-02-22 DIAGNOSIS — F17200 Nicotine dependence, unspecified, uncomplicated: Secondary | ICD-10-CM

## 2019-02-22 DIAGNOSIS — R0602 Shortness of breath: Secondary | ICD-10-CM

## 2019-02-22 DIAGNOSIS — R059 Cough, unspecified: Secondary | ICD-10-CM

## 2019-02-22 NOTE — Patient Instructions (Signed)

## 2019-02-22 NOTE — Progress Notes (Signed)
Established Patient Office Visit  Subjective:  Patient ID: Brenda [name], female    DOB: 07/10/1970  Age: 49 y.o. MRN: [id number]  CC: No chief complaint on file. Patient consents to telephone visit and 2 patient identifiers was used to identify patient.  HPI Brenda [name] presents for follow up of cough and intermittent shortness of breath. She states that her cough has improved 75%, but she continues to experince productive cough with light yellowish phelgm, she states that cough wakes her up at night sometimes, and taking  Robitusin offers some relief. She also states that she experiences mild intermittent shortness of breath with exertion and Advair was expensive and she couldn't afford it. She continues to smoke 1/2 pack of cigarette daily and admits the desire to quit. She denies chest pain, palpitation, light headedness, fever and chills. Overall she states that she's doing well and offers no further complaint.    Past Medical History:  Diagnosis Date  . Ankle fracture, left    in past.  . Anxiety   . Arthritis    Right hip, scheduled for replacement 7/13/25m, left ankle  . Asthma   . Bipolar disorder (HCC)   . Chronic hepatitis C (HCC)   . Depression   . Dyspnea    occasional with exertion  . GERD (gastroesophageal reflux disease)    [facility]  . Headache   . Hearing loss    some loss in both ears - no dx - per patient "runs in family", no hearing aids  . Hypertension   . Lump in female breast    right  . Pneumonia    x 1  . Polysubstance abuse (HCC)   . Primary localized osteoarthritis of hip    right  . SVD (spontaneous vaginal delivery)    x 3  . Uses roller walker    occasional  . Wears dentures    full upper    Past Surgical History:  Procedure Laterality Date  . TOTAL HIP ARTHROPLASTY Right 09/13/2018  . TOTAL HIP ARTHROPLASTY Right 09/13/2018   Procedure: RIGHT TOTAL HIP ARTHROPLASTY ANTERIOR APPROACH;  Surgeon: Tarry Kos, MD;  Location: [facility]  OR;  Service: Orthopedics;  Laterality: Right;  . TUBAL LIGATION      Family History  Problem Relation Age of Onset  . Ovarian cancer Neg Hx   . Colon cancer Neg Hx   . Breast cancer Neg Hx     Social History   Socioeconomic History  . Marital status: Single    Spouse name: Not on file  . Number of children: Not on file  . Years of education: Not on file  . Highest education level: Not on file  Occupational History  . Not on file  Tobacco Use  . Smoking status: Current Every Day Smoker    Packs/day: 1.00    Years: 31.00    Pack years: 31.00    Types: Cigarettes  . Smokeless tobacco: Never Used  . Tobacco comment: since age 89  Substance and Sexual Activity  . Alcohol use: Yes    Comment: rare  . 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 Last cocaine 08/02/18  . Sexual activity: Not Currently    Birth control/protection: Surgical    Comment: tubal   Other Topics Concern  . Not on file  Social History Narrative  . Not on file  Social Determinants of Health   Financial Resource Strain:   . Difficulty of Paying Living Expenses: Not on file  Food Insecurity:   . Worried About Programme researcher, broadcasting/film/video in the Last Year: Not on file  . Ran Out of Food in the Last Year: Not on file  Transportation Needs:   . Lack of Transportation (Medical): Not on file  . Lack of Transportation (Non-Medical): Not on file  Physical Activity:   . Days of Exercise per Week: Not on file  . Minutes of Exercise per Session: Not on file  Stress:   . Feeling of Stress : Not on file  Social Connections:   . Frequency of Communication with Friends and Family: Not on file  . Frequency of Social Gatherings with Friends and Family: Not on file  . Attends Religious Services: Not on file  . Active Member of Clubs or Organizations: Not on file  . Attends Banker Meetings: Not on file  . Marital Status:  Not on file  Intimate Partner Violence:   . Fear of Current or Ex-Partner: Not on file  . Emotionally Abused: Not on file  . Physically Abused: Not on file  . Sexually Abused: Not on file    Outpatient Medications Prior to Visit  Medication Sig Dispense Refill  . albuterol (VENTOLIN HFA) 108 (90 Base) MCG/ACT inhaler Inhale 2 puffs into the lungs every 6 (six) hours as needed for wheezing or shortness of breath. 18 g 2  . aspirin 81 MG chewable tablet Chew 81 mg by mouth daily.    Marland Kitchen diltiazem (CARDIZEM CD) 120 MG 24 hr capsule Take 1 capsule (120 mg total) by mouth daily. 30 capsule 3  . guaifenesin (ROBITUSSIN) 100 MG/5ML syrup Take 10 mLs (200 mg total) by mouth 3 (three) times daily as needed for cough. 240 mL 0  . Fluticasone-Salmeterol (ADVAIR) 100-50 MCG/DOSE AEPB Inhale 1 puff into the lungs 2 (two) times daily. (Patient not taking: Reported on 02/22/2019) 60 each 2  . benzonatate (TESSALON PERLES) 100 MG capsule Take 1 capsule (100 mg total) by mouth 3 (three) times daily as needed for cough. 20 capsule 0  . meloxicam (MOBIC) 15 MG tablet Take 1 tablet (15 mg total) by mouth daily. (Patient not taking: Reported on 02/22/2019) 30 tablet 0   No facility-administered medications prior to visit.    Allergies  Allergen Reactions  . Flexeril [Cyclobenzaprine] Other (See Comments)    RLS-like symptoms   . Seroquel [Quetiapine Fumarate] Other (See Comments)    RLS-like symptoms    ROS Review of Systems  Constitutional: Negative.   Respiratory: Positive for cough and shortness of breath.   Cardiovascular: Negative.   Skin: Negative.   Neurological: Negative.   Psychiatric/Behavioral: Negative.       Objective:    Physical Exam No Physical exam was done There were no vitals taken for this visit. Wt Readings from Last 3 Encounters:  12/21/18 215 lb (97.5 kg)  11/12/18 215 lb (97.5 kg)  10/26/18 213 lb (96.6 kg)     Health Maintenance Due  Topic Date Due  . PAP  SMEAR-Modifier  02/08/1993    There are no preventive care reminders to display for this patient.  Lab Results  Component Value Date   TSH 1.192 04/30/2018   Lab Results  Component Value Date   WBC 13.0 (H) 12/22/2018   HGB 12.8 12/22/2018   HCT 40.1 12/22/2018   MCV 71 (L) 12/22/2018   PLT  341 12/22/2018   Lab Results  Component Value Date   NA 139 12/22/2018   K 4.5 12/22/2018   CO2 22 12/22/2018   GLUCOSE 78 12/22/2018   BUN 11 12/22/2018   CREATININE 0.91 12/22/2018   BILITOT 0.2 12/22/2018   ALKPHOS 133 (H) 12/22/2018   AST 67 (H) 12/22/2018   ALT 88 (H) 12/22/2018   PROT 6.9 12/22/2018   ALBUMIN 3.9 12/22/2018   CALCIUM 9.1 12/22/2018   ANIONGAP 8 09/14/2018   Lab Results  Component Value Date   CHOL 149 12/22/2018   Lab Results  Component Value Date   HDL 36 (L) 12/22/2018   Lab Results  Component Value Date   LDLCALC 94 12/22/2018   Lab Results  Component Value Date   TRIG 99 12/22/2018   Lab Results  Component Value Date   CHOLHDL 4.1 12/22/2018   Lab Results  Component Value Date   HGBA1C 5.3 12/22/2018      Assessment & Plan:   1. Shortness of breath -She was advised to use Albuterol inhaler as needed and complete Medication Management application so she can get her medication free. She was advised to notify clinic or go to the ED for worsening symptoms.  2. Cough - She was advised to use OTC Robitussin and notify clinic for worsening symptoms.  3. Smoking - She was strongly encouraged on smoking cessation and was provided with [place] [facility]uitline information.     Follow-up: Return in about 4 months (around 06/22/2019), or if symptoms worsen or fail to improve.    Taeveon Keesling Jerold Coombe, NP

## 2019-03-11 NOTE — Telephone Encounter (Signed)
Patient waiting on charity care to be reinstated .  Will call back when she is able to schedule.

## 2019-06-29 ENCOUNTER — Ambulatory Visit: Payer: Medicaid Other

## 2019-07-19 NOTE — Telephone Encounter (Signed)
LVM to check status of charity care and scheduling

## 2019-10-21 NOTE — Telephone Encounter (Signed)
L VM to schedule testing and follow up appointment

## 2020-02-17 NOTE — Telephone Encounter (Signed)
Unable to schedule after multiple attempts     Closing encounter and sending letter to patient .

## 2020-04-30 ENCOUNTER — Other Ambulatory Visit: Payer: Self-pay | Admitting: Internal Medicine

## 2020-04-30 ENCOUNTER — Ambulatory Visit
Admission: RE | Admit: 2020-04-30 | Discharge: 2020-04-30 | Disposition: A | Discharge status: Home / Self Care | Payer: Disability Insurance | Source: Ambulatory Visit | Attending: Internal Medicine | Admitting: Internal Medicine

## 2020-04-30 DIAGNOSIS — Z96649 Presence of unspecified artificial hip joint: Secondary | ICD-10-CM | POA: Diagnosis present

## 2020-04-30 DIAGNOSIS — M1990 Unspecified osteoarthritis, unspecified site: Secondary | ICD-10-CM

## 2020-04-30 IMAGING — CR DG FOOT 2V*L*
2 series · 2 of 2 positions shown · non-contrast
Comparison: None.

CLINICAL DATA: Left foot pain.

EXAM:
LEFT FOOT - 2 VIEW

[foot ap]
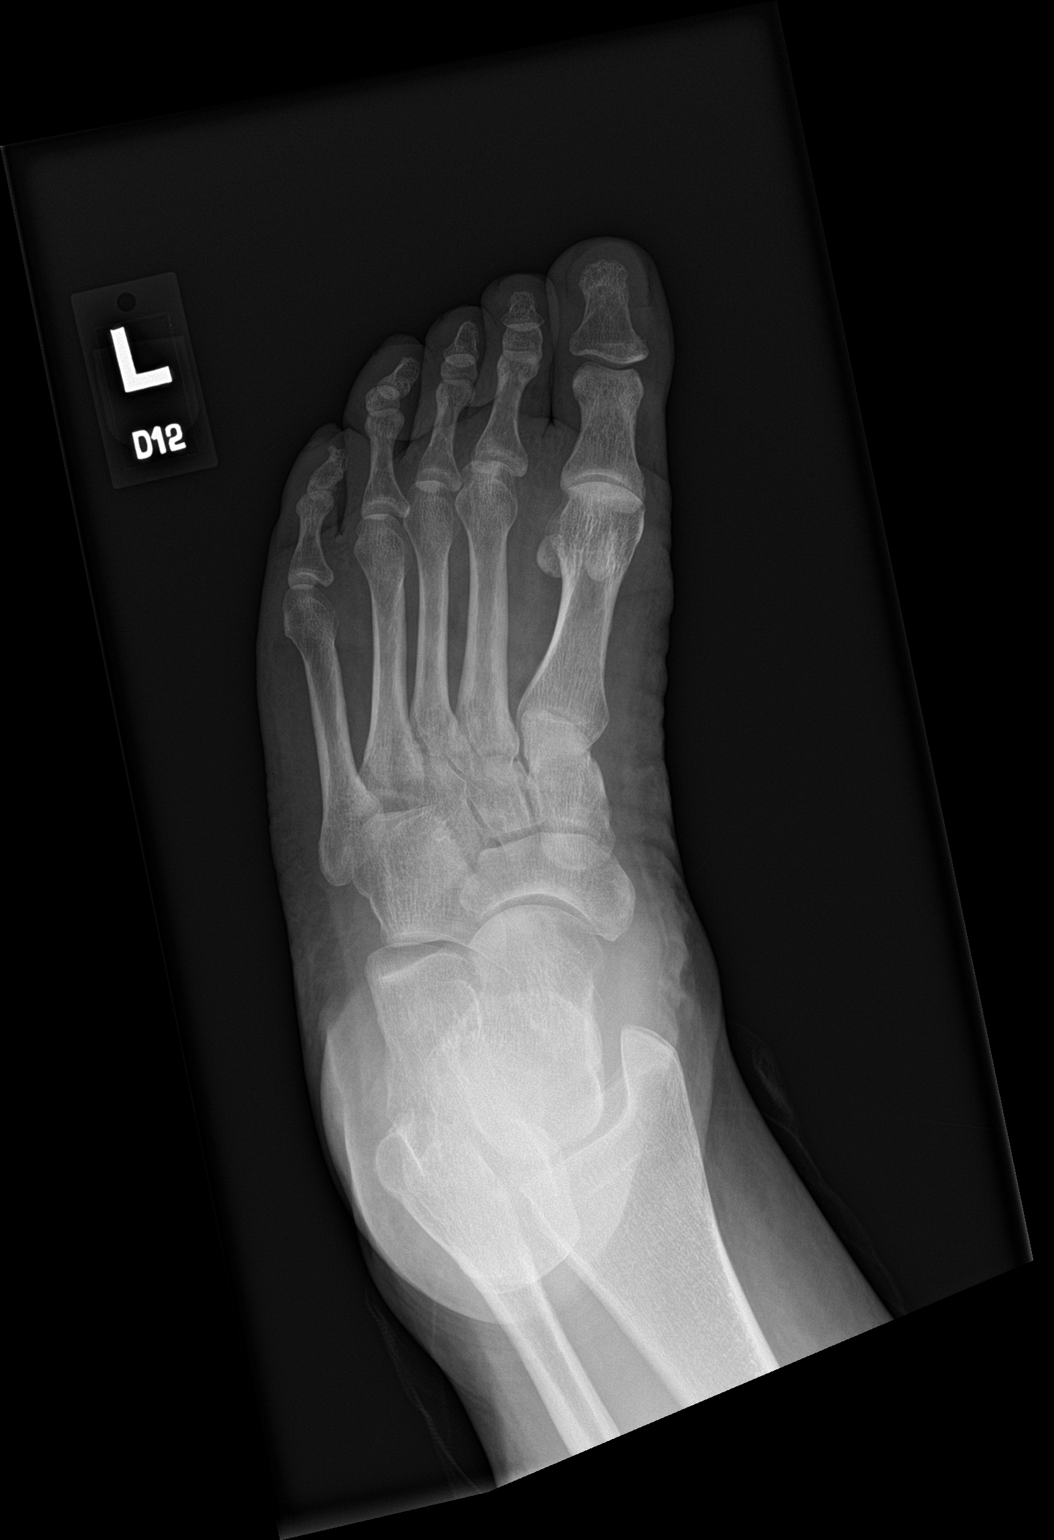

[foot lat]
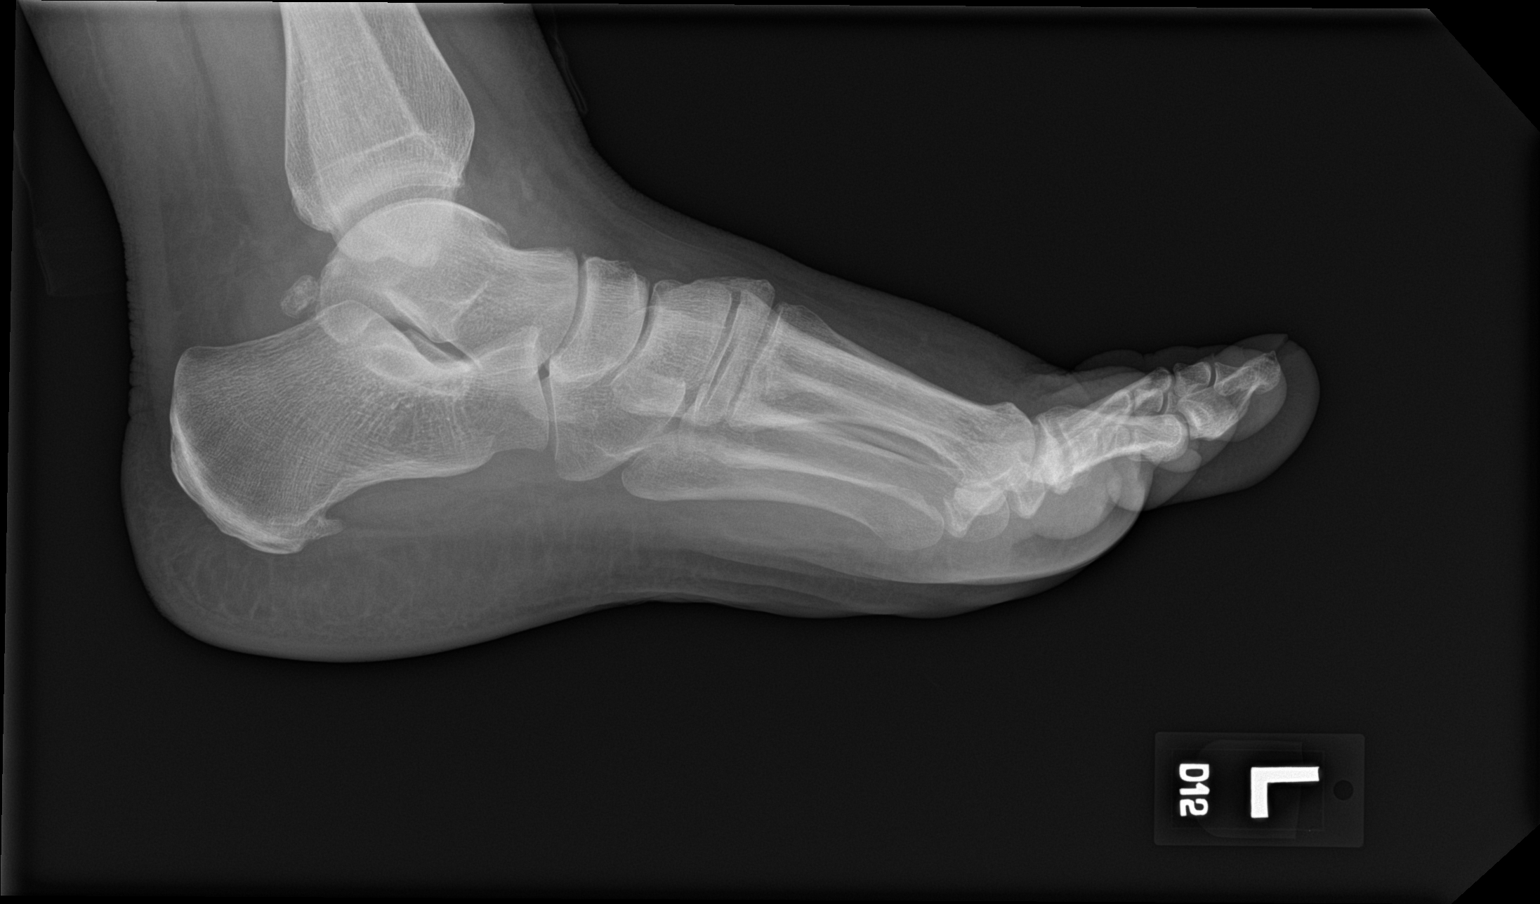

[2 of 2 positions shown; findings below may reference images not displayed]

FINDINGS: Mild flattening with subchondral sclerosis and cystic change
involving the second metatarsal head. There is no evidence of
fracture or dislocation. Normal alignment and joint spaces. Trace
degenerative spurring at the first metatarsal phalangeal joint. No
evidence of inflammatory arthropathy or bone destruction. Moderate
plantar calcaneal spur. Incidental os trigonum. Soft tissues are
unremarkable.
IMPRESSION: 1.  Possible avascular necrosis of the second metatarsal head.

2. Trace degenerative spurring at the first metatarsophalangeal
joint.

3.  Moderate plantar calcaneal spur.

## 2020-04-30 IMAGING — CR DG HIP (WITH OR WITHOUT PELVIS) 2-3V*R*
3 series · 3 of 3 positions shown · non-contrast
Comparison: None.

CLINICAL DATA: History of hip replacement.  Pain.

EXAM:
DG HIP (WITH OR WITHOUT PELVIS) 2-3V RIGHT

[pelvis ap]
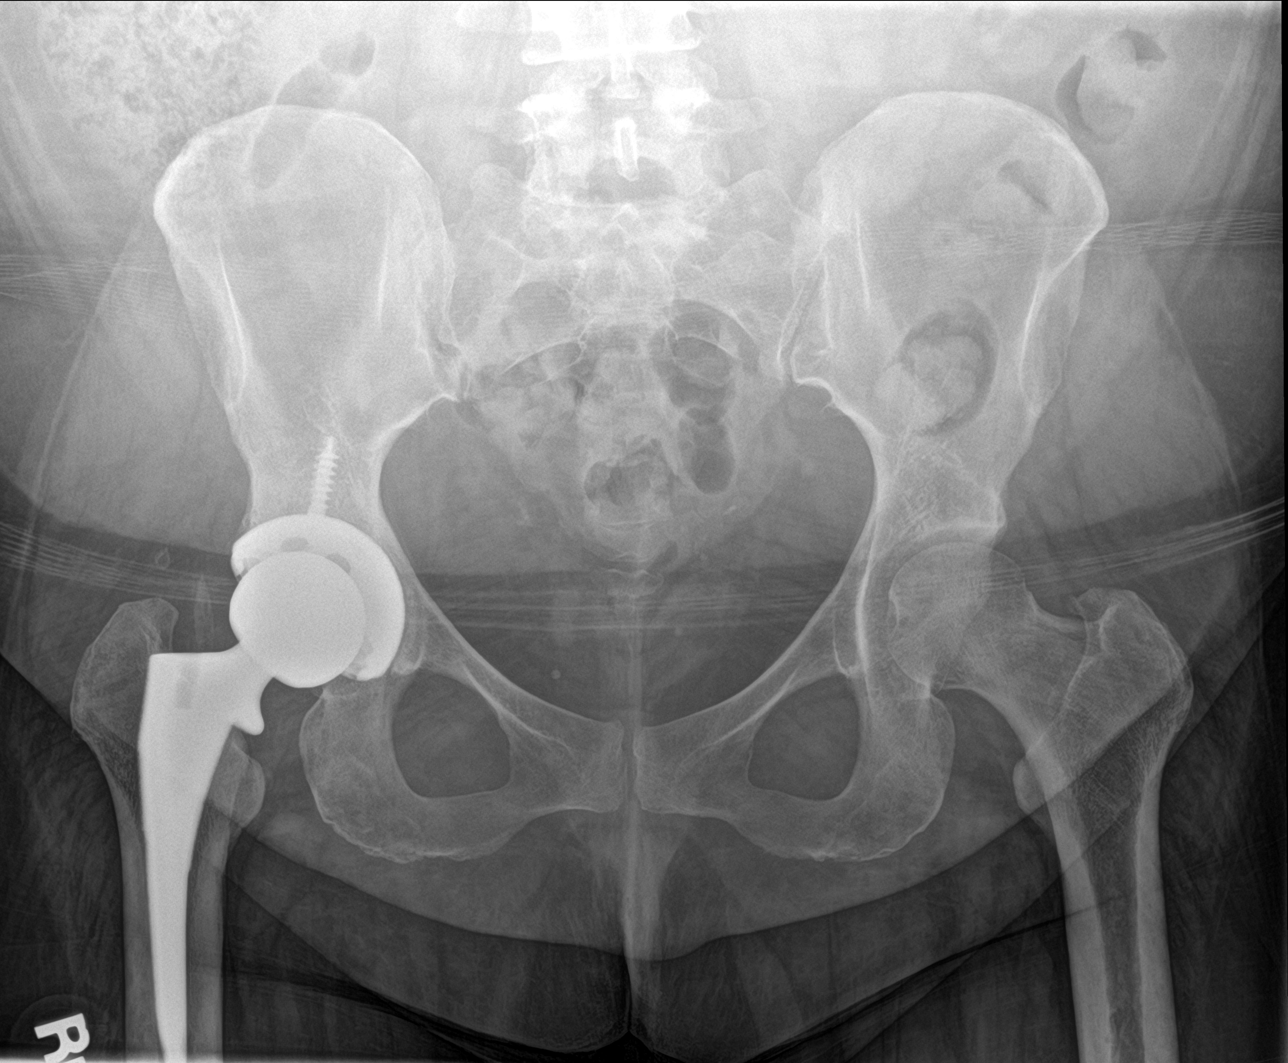

[hip ap]
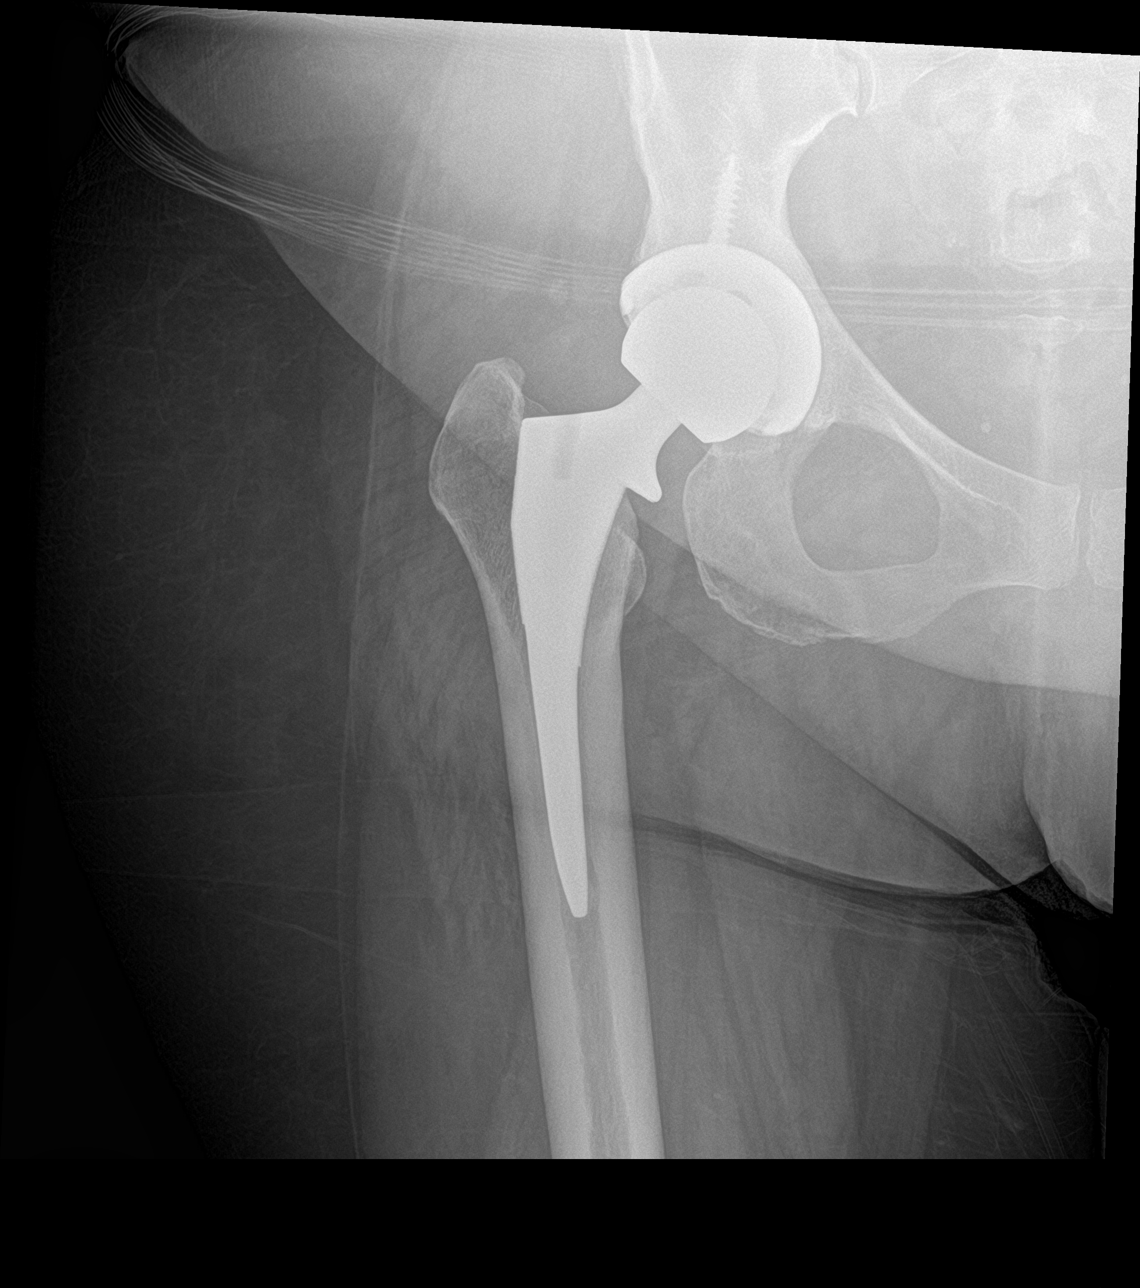

[hip lat]
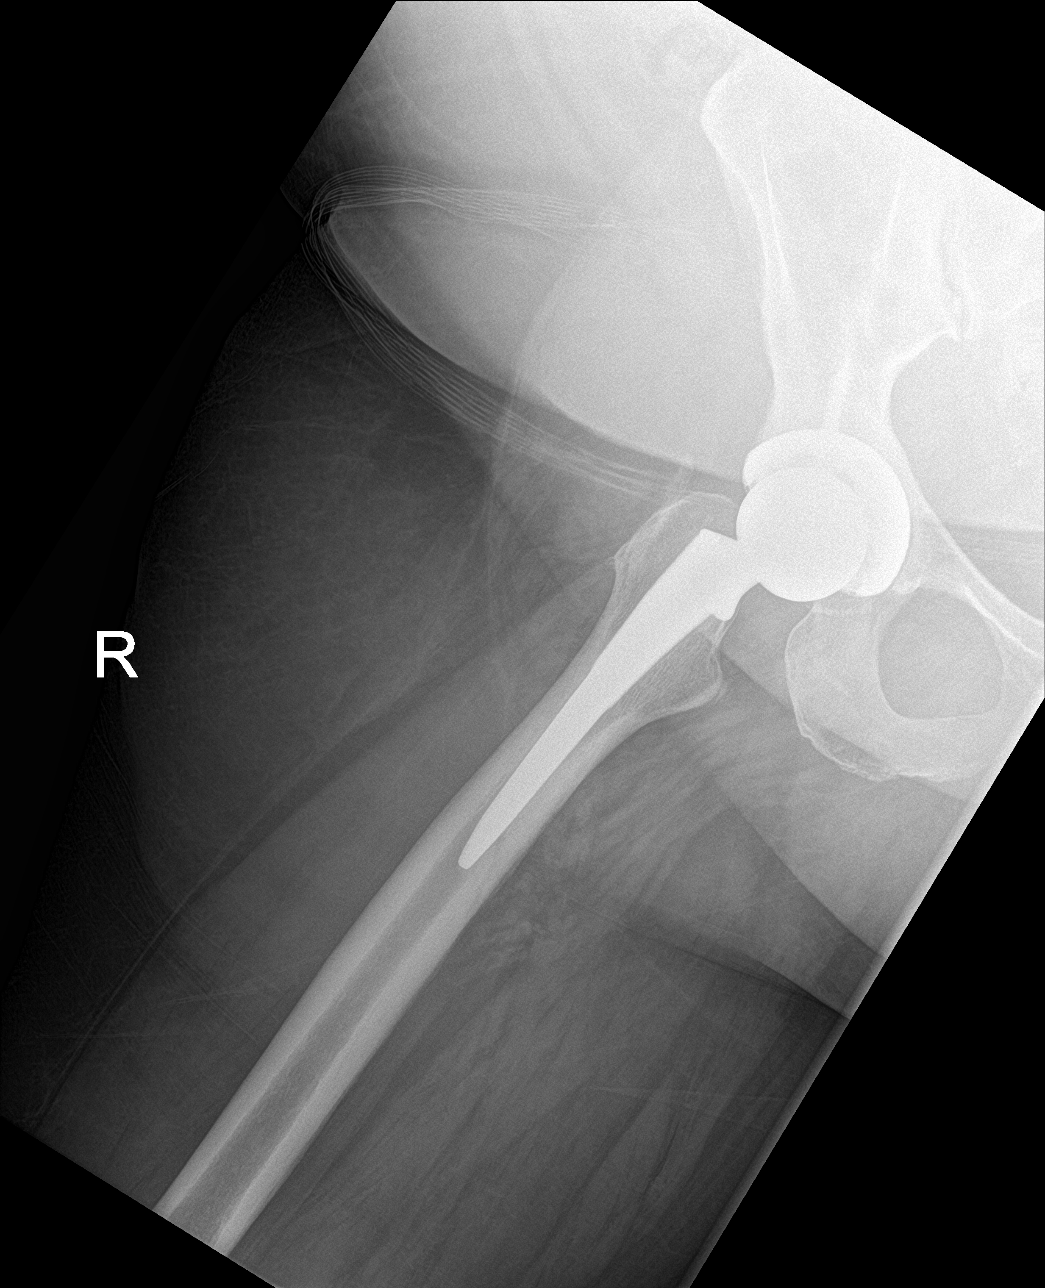

[3 of 3 positions shown; findings below may reference images not displayed]

FINDINGS: The patient is status post right hip replacement. Acetabular and
femoral components are in good position. No other abnormalities.
IMPRESSION: Right hip replacement in good position.  No other abnormalities.

## 2020-04-30 IMAGING — CR DG HIP (WITH OR WITHOUT PELVIS) 2-3V*L*
2 series · 2 of 2 positions shown · non-contrast
Comparison: None.

CLINICAL DATA: Pain.

EXAM:
DG HIP (WITH OR WITHOUT PELVIS) 2-3V LEFT

[hip ap]
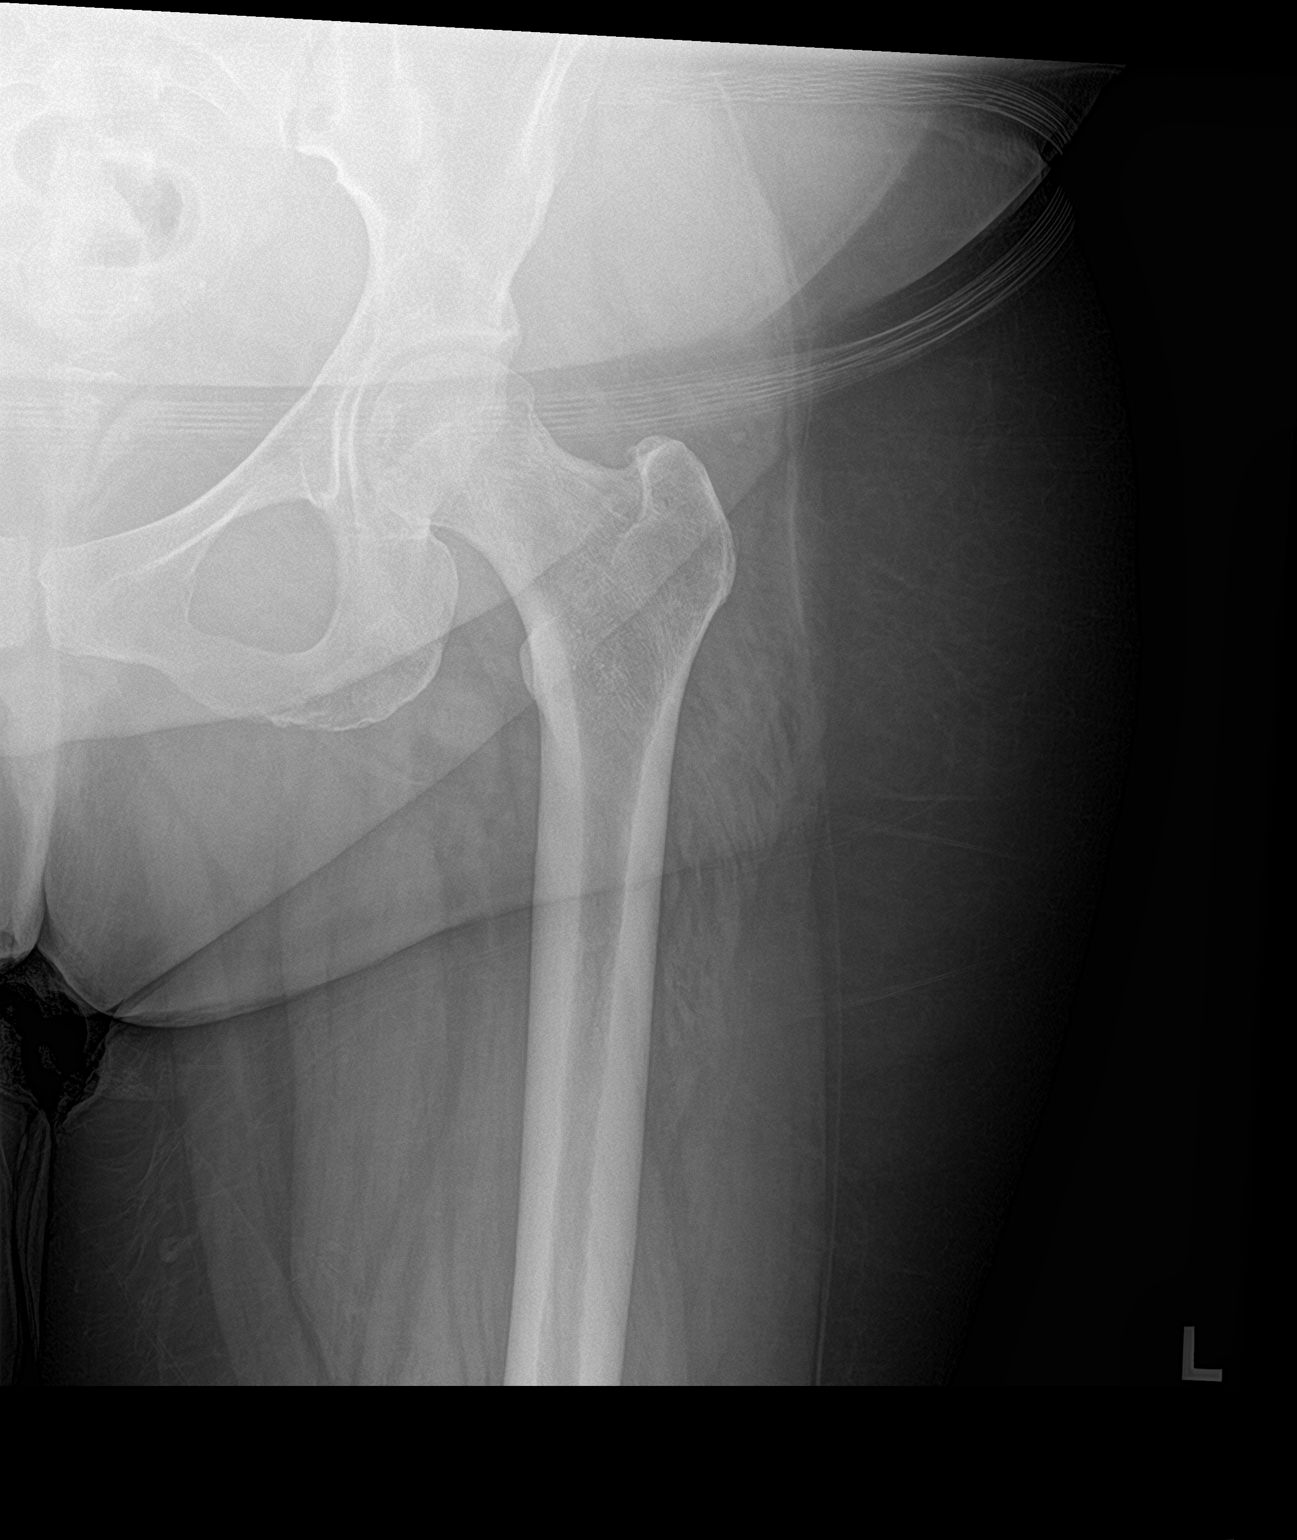

[hip lat]
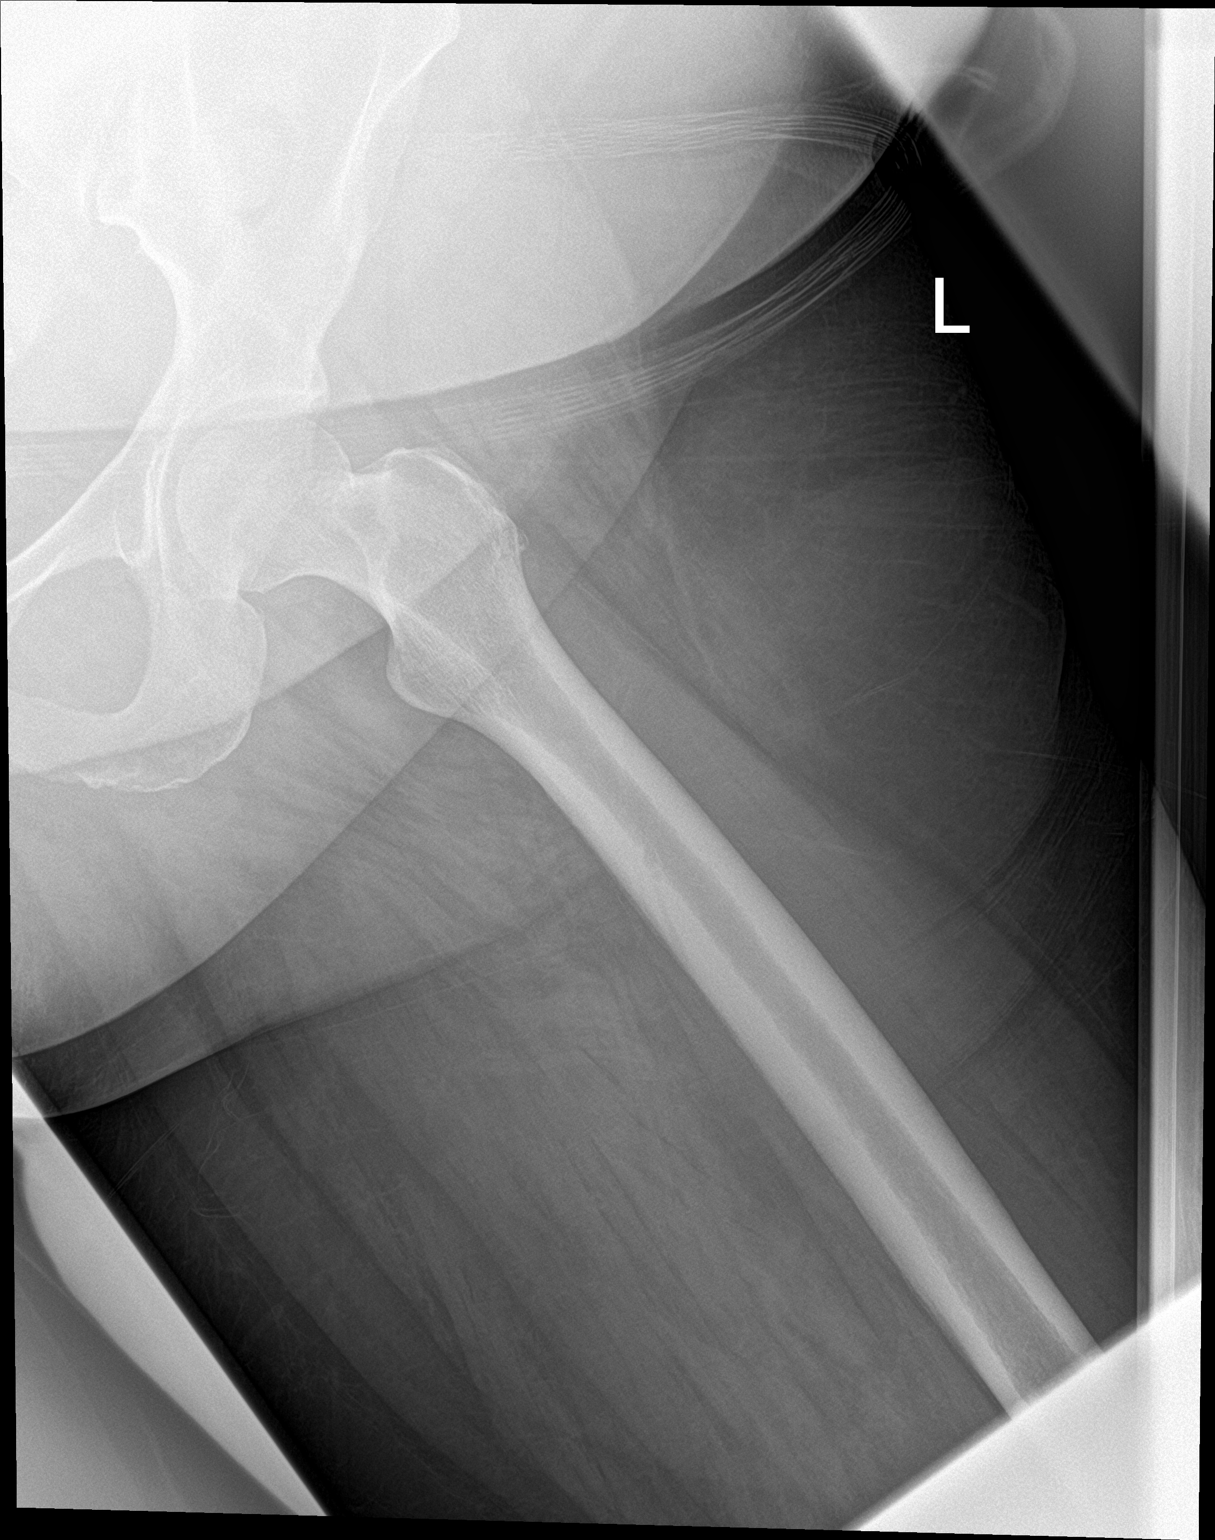

[2 of 2 positions shown; findings below may reference images not displayed]

FINDINGS: There is no evidence of hip fracture or dislocation. There is no
evidence of arthropathy or other focal bone abnormality.
IMPRESSION: Negative.

## 2020-08-24 ENCOUNTER — Inpatient Hospital Stay
Admission: EM | Admit: 2020-08-24 | Discharge: 2020-08-29 | DRG: 872 | Disposition: A | Discharge status: Home / Self Care | Payer: Self-pay | Attending: Internal Medicine | Admitting: Internal Medicine

## 2020-08-24 ENCOUNTER — Other Ambulatory Visit: Payer: Self-pay

## 2020-08-24 ENCOUNTER — Emergency Department: Payer: Disability Insurance

## 2020-08-24 DIAGNOSIS — R079 Chest pain, unspecified: Secondary | ICD-10-CM

## 2020-08-24 DIAGNOSIS — F1910 Other psychoactive substance abuse, uncomplicated: Secondary | ICD-10-CM | POA: Diagnosis present

## 2020-08-24 DIAGNOSIS — B182 Chronic viral hepatitis C: Secondary | ICD-10-CM | POA: Diagnosis present

## 2020-08-24 DIAGNOSIS — M4624 Osteomyelitis of vertebra, thoracic region: Secondary | ICD-10-CM | POA: Diagnosis present

## 2020-08-24 DIAGNOSIS — F1410 Cocaine abuse, uncomplicated: Secondary | ICD-10-CM | POA: Diagnosis present

## 2020-08-24 DIAGNOSIS — F1010 Alcohol abuse, uncomplicated: Secondary | ICD-10-CM | POA: Diagnosis present

## 2020-08-24 DIAGNOSIS — F418 Other specified anxiety disorders: Secondary | ICD-10-CM | POA: Diagnosis present

## 2020-08-24 DIAGNOSIS — M4644 Discitis, unspecified, thoracic region: Secondary | ICD-10-CM

## 2020-08-24 DIAGNOSIS — Z20822 Contact with and (suspected) exposure to covid-19: Secondary | ICD-10-CM | POA: Diagnosis present

## 2020-08-24 DIAGNOSIS — N3000 Acute cystitis without hematuria: Secondary | ICD-10-CM | POA: Diagnosis present

## 2020-08-24 DIAGNOSIS — F17200 Nicotine dependence, unspecified, uncomplicated: Secondary | ICD-10-CM | POA: Diagnosis present

## 2020-08-24 DIAGNOSIS — F1123 Opioid dependence with withdrawal: Secondary | ICD-10-CM | POA: Diagnosis not present

## 2020-08-24 DIAGNOSIS — A419 Sepsis, unspecified organism: Secondary | ICD-10-CM | POA: Diagnosis present

## 2020-08-24 DIAGNOSIS — H918X3 Other specified hearing loss, bilateral: Secondary | ICD-10-CM | POA: Diagnosis present

## 2020-08-24 DIAGNOSIS — R112 Nausea with vomiting, unspecified: Secondary | ICD-10-CM | POA: Diagnosis present

## 2020-08-24 DIAGNOSIS — A4159 Other Gram-negative sepsis: Principal | ICD-10-CM | POA: Diagnosis present

## 2020-08-24 DIAGNOSIS — Z96641 Presence of right artificial hip joint: Secondary | ICD-10-CM | POA: Diagnosis present

## 2020-08-24 DIAGNOSIS — F419 Anxiety disorder, unspecified: Secondary | ICD-10-CM | POA: Diagnosis present

## 2020-08-24 DIAGNOSIS — F17210 Nicotine dependence, cigarettes, uncomplicated: Secondary | ICD-10-CM | POA: Diagnosis present

## 2020-08-24 DIAGNOSIS — M50322 Other cervical disc degeneration at C5-C6 level: Secondary | ICD-10-CM | POA: Diagnosis present

## 2020-08-24 DIAGNOSIS — E876 Hypokalemia: Secondary | ICD-10-CM | POA: Diagnosis present

## 2020-08-24 DIAGNOSIS — J45909 Unspecified asthma, uncomplicated: Secondary | ICD-10-CM | POA: Diagnosis present

## 2020-08-24 DIAGNOSIS — R7881 Bacteremia: Secondary | ICD-10-CM | POA: Diagnosis present

## 2020-08-24 DIAGNOSIS — M47817 Spondylosis without myelopathy or radiculopathy, lumbosacral region: Secondary | ICD-10-CM | POA: Diagnosis present

## 2020-08-24 DIAGNOSIS — M4802 Spinal stenosis, cervical region: Secondary | ICD-10-CM | POA: Diagnosis present

## 2020-08-24 DIAGNOSIS — Z8709 Personal history of other diseases of the respiratory system: Secondary | ICD-10-CM

## 2020-08-24 DIAGNOSIS — R911 Solitary pulmonary nodule: Secondary | ICD-10-CM

## 2020-08-24 DIAGNOSIS — B1920 Unspecified viral hepatitis C without hepatic coma: Secondary | ICD-10-CM

## 2020-08-24 DIAGNOSIS — I10 Essential (primary) hypertension: Secondary | ICD-10-CM | POA: Diagnosis present

## 2020-08-24 DIAGNOSIS — A498 Other bacterial infections of unspecified site: Secondary | ICD-10-CM

## 2020-08-24 DIAGNOSIS — L03113 Cellulitis of right upper limb: Secondary | ICD-10-CM | POA: Diagnosis present

## 2020-08-24 DIAGNOSIS — F319 Bipolar disorder, unspecified: Secondary | ICD-10-CM | POA: Diagnosis present

## 2020-08-24 DIAGNOSIS — K760 Fatty (change of) liver, not elsewhere classified: Secondary | ICD-10-CM

## 2020-08-24 DIAGNOSIS — M549 Dorsalgia, unspecified: Secondary | ICD-10-CM | POA: Diagnosis present

## 2020-08-24 DIAGNOSIS — F1990 Other psychoactive substance use, unspecified, uncomplicated: Secondary | ICD-10-CM | POA: Diagnosis present

## 2020-08-24 DIAGNOSIS — B9620 Unspecified Escherichia coli [E. coli] as the cause of diseases classified elsewhere: Secondary | ICD-10-CM | POA: Diagnosis present

## 2020-08-24 DIAGNOSIS — N390 Urinary tract infection, site not specified: Secondary | ICD-10-CM | POA: Diagnosis present

## 2020-08-24 LAB — MAGNESIUM: Magnesium: 1.2 mg/dL — ABNORMAL LOW (ref 1.7–2.4)

## 2020-08-24 LAB — CBC WITH DIFFERENTIAL/PLATELET
Abs Immature Granulocytes: 0.08 10*3/uL — ABNORMAL HIGH (ref 0.00–0.07)
Basophils Absolute: 0.1 10*3/uL (ref 0.0–0.1)
Basophils Relative: 1 %
Eosinophils Absolute: 0 10*3/uL (ref 0.0–0.5)
Eosinophils Relative: 0 %
HCT: 39.2 % (ref 36.0–46.0)
Hemoglobin: 12.3 g/dL (ref 12.0–15.0)
Immature Granulocytes: 1 %
Lymphocytes Relative: 10 %
Lymphs Abs: 1.1 10*3/uL (ref 0.7–4.0)
MCH: 22.9 pg — ABNORMAL LOW (ref 26.0–34.0)
MCHC: 31.4 g/dL (ref 30.0–36.0)
MCV: 73.1 fL — ABNORMAL LOW (ref 80.0–100.0)
Monocytes Absolute: 0.2 10*3/uL (ref 0.1–1.0)
Monocytes Relative: 2 %
Neutro Abs: 9.6 10*3/uL — ABNORMAL HIGH (ref 1.7–7.7)
Neutrophils Relative %: 86 %
Platelets: 179 10*3/uL (ref 150–400)
RBC: 5.36 MIL/uL — ABNORMAL HIGH (ref 3.87–5.11)
RDW: 21.9 % — ABNORMAL HIGH (ref 11.5–15.5)
Smear Review: NORMAL
WBC: 11.1 10*3/uL — ABNORMAL HIGH (ref 4.0–10.5)
nRBC: 0 % (ref 0.0–0.2)

## 2020-08-24 LAB — COMPREHENSIVE METABOLIC PANEL
ALT: 58 U/L — ABNORMAL HIGH (ref 0–44)
AST: 36 U/L (ref 15–41)
Albumin: 3 g/dL — ABNORMAL LOW (ref 3.5–5.0)
Alkaline Phosphatase: 93 U/L (ref 38–126)
Anion gap: 12 (ref 5–15)
BUN: 7 mg/dL (ref 6–20)
CO2: 26 mmol/L (ref 22–32)
Calcium: 9.3 mg/dL (ref 8.9–10.3)
Chloride: 96 mmol/L — ABNORMAL LOW (ref 98–111)
Creatinine, Ser: 0.93 mg/dL (ref 0.44–1.00)
GFR, Estimated: >60 mL/min (ref >60)
Glucose, Bld: 156 mg/dL — ABNORMAL HIGH (ref 70–99)
Potassium: 3 mmol/L — ABNORMAL LOW (ref 3.5–5.1)
Sodium: 134 mmol/L — ABNORMAL LOW (ref 135–145)
Total Bilirubin: 1 mg/dL (ref 0.3–1.2)
Total Protein: 7.5 g/dL (ref 6.5–8.1)

## 2020-08-24 LAB — SEDIMENTATION RATE: Sed Rate: 59 mm/hr — ABNORMAL HIGH (ref 0–20)

## 2020-08-24 LAB — RESP PANEL BY RT-PCR (FLU A&B, COVID) ARPGX2
Influenza A by PCR: NEGATIVE
Influenza B by PCR: NEGATIVE
SARS Coronavirus 2 by RT PCR: NEGATIVE

## 2020-08-24 LAB — HCG, QUANTITATIVE, PREGNANCY: hCG, Beta Chain, Quant, S: <1 m[IU]/mL (ref <5)

## 2020-08-24 LAB — PROTIME-INR
INR: 1.1 (ref 0.8–1.2)
Prothrombin Time: 14 seconds (ref 11.4–15.2)

## 2020-08-24 LAB — PROCALCITONIN: Procalcitonin: 5.58 ng/mL

## 2020-08-24 LAB — LACTIC ACID, PLASMA: Lactic Acid, Venous: 2.3 mmol/L (ref 0.5–1.9)

## 2020-08-24 IMAGING — DX DG CHEST 1V
1 series · 1 of 1 positions shown · non-contrast
Comparison: [DATE]

CLINICAL DATA: Sepsis

EXAM:
CHEST  1 VIEW

[chest ap]
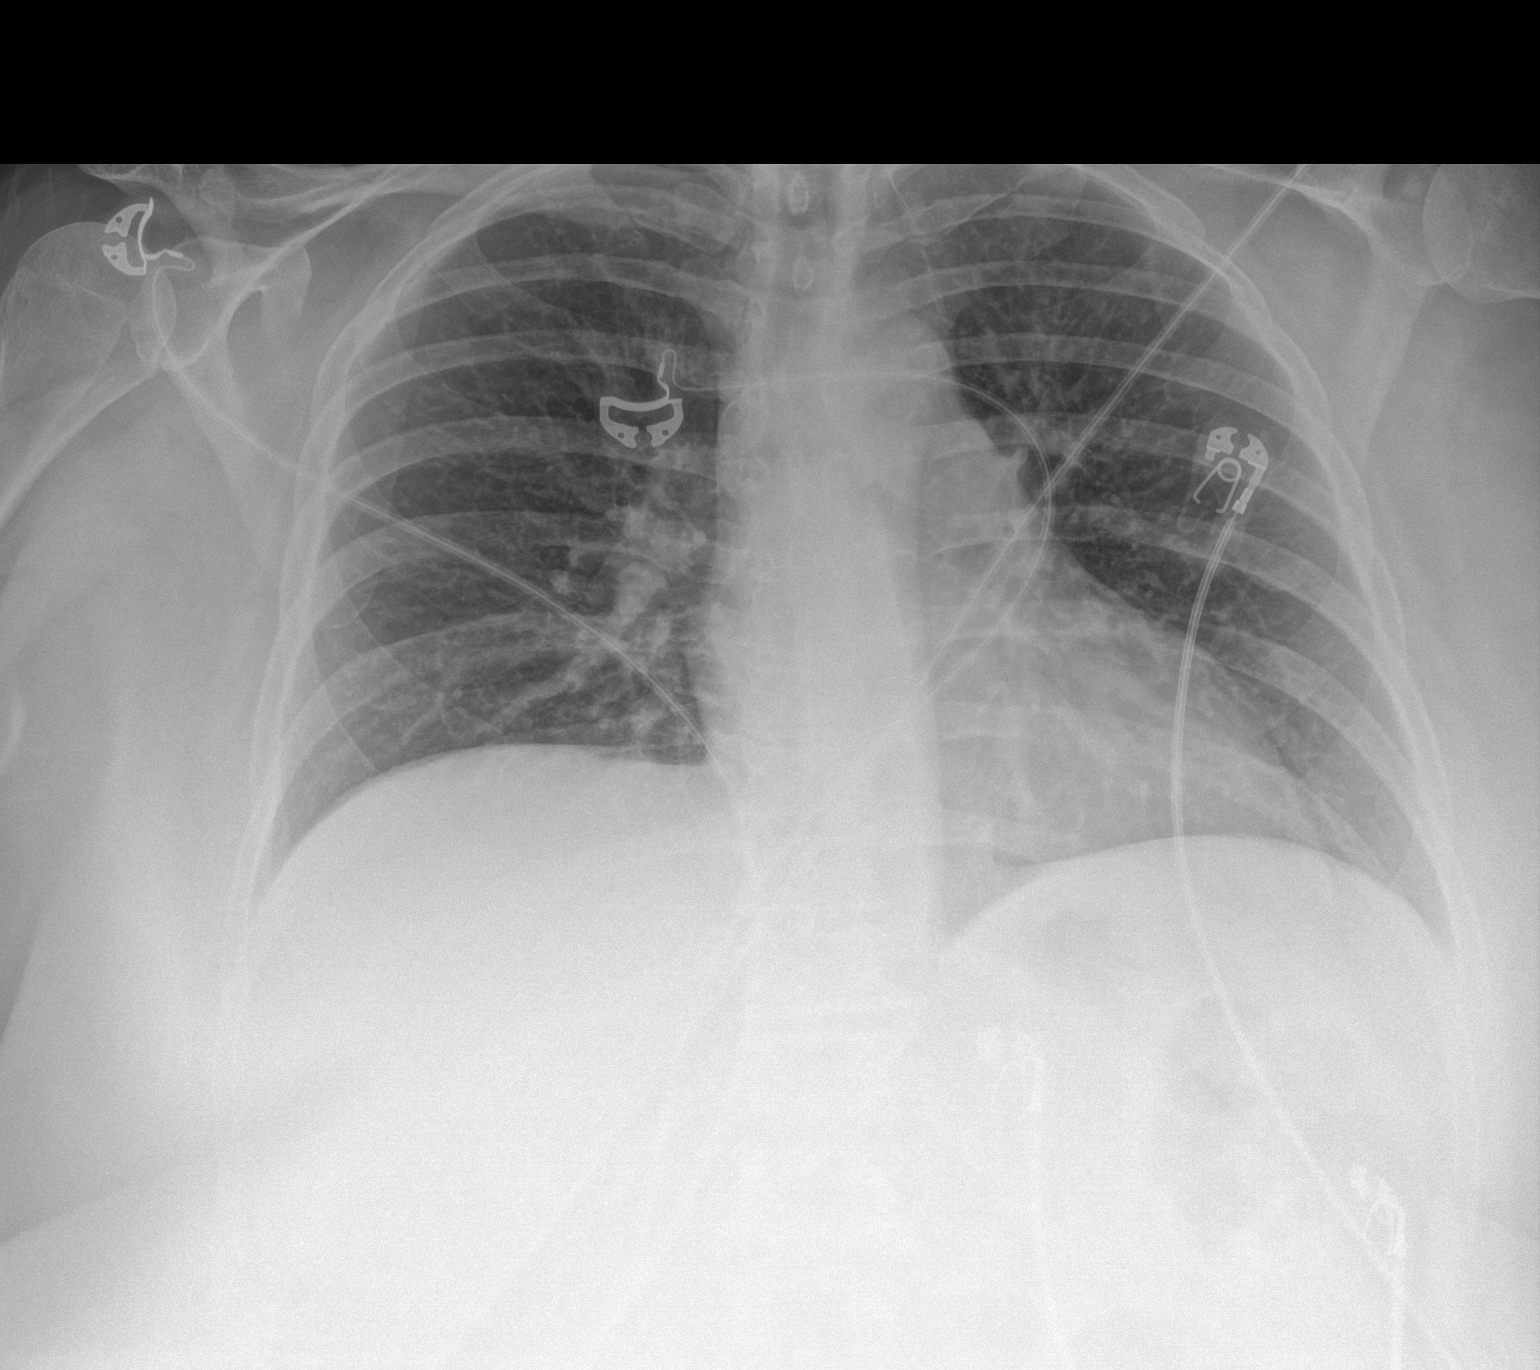

[1 of 1 positions shown; findings below may reference images not displayed]

FINDINGS: The heart size and mediastinal contours are within normal limits.
Both lungs are clear. The visualized skeletal structures are
unremarkable.
IMPRESSION: Normal study.

## 2020-08-24 MED ORDER — SODIUM CHLORIDE 0.9 % IV SOLN
2.0000 g | Freq: Once | INTRAVENOUS | Status: AC
  Administered 2020-08-24: 2 g via INTRAVENOUS
  Filled 2020-08-24: qty 2

## 2020-08-24 MED ORDER — IOHEXOL 350 MG/ML SOLN
100.0000 mL | Freq: Once | INTRAVENOUS | Status: AC | PRN
  Administered 2020-08-24: 100 mL via INTRAVENOUS

## 2020-08-24 MED ORDER — MORPHINE SULFATE (PF) 4 MG/ML IV SOLN
6.0000 mg | Freq: Once | INTRAVENOUS | Status: AC
  Administered 2020-08-24: 6 mg via INTRAVENOUS
  Filled 2020-08-24: qty 2

## 2020-08-24 MED ORDER — LACTATED RINGERS IV BOLUS
1000.0000 mL | Freq: Once | INTRAVENOUS | Status: AC
  Administered 2020-08-25: 1000 mL via INTRAVENOUS

## 2020-08-24 MED ORDER — MAGNESIUM SULFATE 4 GM/100ML IV SOLN
4.0000 g | Freq: Once | INTRAVENOUS | Status: AC
  Administered 2020-08-25: 4 g via INTRAVENOUS
  Filled 2020-08-24: qty 100

## 2020-08-24 MED ORDER — VANCOMYCIN HCL 1.5 G IV SOLR
1500.0000 mg | Freq: Once | INTRAVENOUS | Status: AC
  Administered 2020-08-25: 1500 mg via INTRAVENOUS
  Filled 2020-08-24: qty 1500

## 2020-08-24 MED ORDER — LACTATED RINGERS IV BOLUS
1000.0000 mL | Freq: Once | INTRAVENOUS | Status: AC
  Administered 2020-08-24: 1000 mL via INTRAVENOUS

## 2020-08-24 MED ORDER — VANCOMYCIN HCL IN DEXTROSE 1-5 GM/200ML-% IV SOLN
1000.0000 mg | Freq: Once | INTRAVENOUS | Status: AC
  Administered 2020-08-24: 1000 mg via INTRAVENOUS
  Filled 2020-08-24: qty 200

## 2020-08-24 MED ORDER — POTASSIUM CHLORIDE CRYS ER 20 MEQ PO TBCR
40.0000 meq | EXTENDED_RELEASE_TABLET | Freq: Once | ORAL | Status: AC
  Administered 2020-08-25: 40 meq via ORAL
  Filled 2020-08-24: qty 2

## 2020-08-24 NOTE — ED Triage Notes (Signed)
Pt complains of mid to lower back pain, vomiting, fever and hematuria for a week. Pt states she fell injuring self earlier. This week.

## 2020-08-24 NOTE — ED Notes (Signed)
Report given to Dahlia Byes

## 2020-08-24 NOTE — ED Provider Notes (Signed)
-----------------------------------------   11:33 PM on 08/24/2020 -----------------------------------------  Assuming care from Dr. Michiel Sites.  In short, Brenda [name] is a 50 y.o. female with a chief complaint of sepsis and low back pain in the setting of IV drug use.  Refer to the original H&P for additional details.  The current plan of care is to follow up CTs and MRIs, admit.   ----------------------------------------- 5:26 AM on 08/25/2020 -----------------------------------------  Patient has been stable all night.  Labs all point towards severe sepsis although she has no evidence of shock.  She received empiric antibiotics as per the note from Dr. Katrinka Blazing.  She obtained her MRIs and I spoke directly with the radiologist, Dr. Phill Myron, about the results.  He said there is no evidence of epidural abscess and does not seem to be a drainable fluid collection, but he does wonder if she has some early osteomyelitis of the thoracic spine.  However there does not seem to be any need for neurosurgical intervention.  I talked in person with Dr. Para March with the hospitalist service and she will admit.   Loleta Rose, MD 08/25/20 [phone]

## 2020-08-24 NOTE — ED Provider Notes (Signed)
[facility] Emergency Department Provider Note  ____________________________________________   None    (approximate)  I have reviewed the triage vital signs and the nursing notes.   HISTORY  Chief Complaint Back Pain   HPI Brenda [name] is a 50 y.o. female with a past medical history of hep C, bipolar disorder, asthma, anxiety, arthritis, and IV drug use most recently with fentanyl yesterday in the right hand who presents for assessment of for 4 days of vomiting, chest pain, increased cough and shortness of breath from baseline and fairly severe low back pain she states began when she was walking down some steps gave out from under her and she fell on her lower back.  She states she has chronic urinary incontinence and this is not sure if this is new at all.  She has not had any stool incontinence.  She has no pain in her arms or legs or head and does not think she injured any joints.  She stated that she has a little redness at the base of her right thumb where she last injected yesterday.  She denies any other illicit drug use or EtOH use.  Denies any other recent injuries or falls.  No other acute concerns at this time         Past Medical History:  Diagnosis Date   Ankle fracture, left    in past.   Anxiety    Arthritis    Right hip, scheduled for replacement 7/13/7m, left ankle   Asthma    Bipolar disorder ([place])    Chronic hepatitis C ([place])    Depression    Dyspnea    occasional with exertion   GERD (gastroesophageal reflux disease)    pmh   Headache    Hearing loss    some loss in both ears - no dx - per patient "runs in family", no hearing aids   Hypertension    Lump in female breast    right   Pneumonia    x 1   Polysubstance abuse ([place])    Primary localized osteoarthritis of hip    right   SVD (spontaneous vaginal delivery)    x 3   Uses roller walker    occasional   Wears dentures    full upper    Patient Active  Problem List   Diagnosis Date Noted   Smoking 02/22/2019   Cough 01/24/2019   Shortness of breath 01/24/2019   Sore throat 01/24/2019   Chest tightness 12/20/2018   History of hepatitis 10/26/2018   Urinary tract infection symptoms 10/26/2018   Sleep apnea 10/26/2018   Back pain 10/26/2018   Abnormal EKG 09/22/2018   History of asthma 09/22/2018   Right hip pain 09/22/2018   Status post right hip replacement 09/13/2018   Leg edema 08/22/2018   Primary osteoarthritis of right hip 07/27/2018   Vaginal discharge 07/14/2018   Sepsis ([place]) 04/30/2018   Opioid use disorder, moderate, dependence ([place]) 05/02/2016   Tobacco use disorder 05/02/2016   Severe recurrent major depression without psychotic features ([place]) 05/01/2016   Cocaine use disorder, moderate, dependence ([place]) 05/01/2016   Alcohol abuse 05/01/2016   GERD (gastroesophageal reflux disease) 05/01/2016    Past Surgical History:  Procedure Laterality Date   TOTAL HIP ARTHROPLASTY Right 09/13/2018   TOTAL HIP ARTHROPLASTY Right 09/13/2018   Procedure: RIGHT TOTAL HIP ARTHROPLASTY ANTERIOR APPROACH;  Surgeon: Leandrew Koyanagi, MD;  Location: [facility];  Service: Orthopedics;  Laterality: Right;  TUBAL LIGATION      Prior to Admission medications   Medication Sig Start Date End Date Taking? Authorizing Provider  albuterol (VENTOLIN HFA) 108 (90 Base) MCG/ACT inhaler Inhale 2 puffs into the lungs every 6 (six) hours as needed for wheezing or shortness of breath. Patient not taking: Reported on 08/24/2020 01/24/19   Iloabachie, Chioma E, NP  aspirin 81 MG chewable tablet Chew 81 mg by mouth daily. Patient not taking: Reported on 08/24/2020    [provider]  diltiazem (CARDIZEM CD) 120 MG 24 hr capsule Take 1 capsule (120 mg total) by mouth daily. Patient not taking: Reported on 08/24/2020 12/20/18   Iloabachie, Chioma E, NP  Fluticasone-Salmeterol (ADVAIR) 100-50 MCG/DOSE AEPB Inhale 1 puff into the lungs 2 (two) times  daily. Patient not taking: No sig reported 09/22/18   Iloabachie, Chioma E, NP  guaifenesin (ROBITUSSIN) 100 MG/5ML syrup Take 10 mLs (200 mg total) by mouth 3 (three) times daily as needed for cough. Patient not taking: Reported on 08/24/2020 01/24/19   Iloabachie, Chioma E, NP    Allergies Flexeril [cyclobenzaprine] and Seroquel [quetiapine fumarate]  Family History  Problem Relation Age of Onset   Ovarian cancer Neg Hx    Colon cancer Neg Hx    Breast cancer Neg Hx     Social History Social History   Tobacco Use   Smoking status: Every Day    Packs/day: 1.00    Years: 31.00    Pack years: 31.00    Types: Cigarettes   Smokeless tobacco: Never   Tobacco comments:    since age 80  Vaping Use   Vaping Use: Never used  Substance Use Topics   Alcohol use: Yes    Comment: rare   Drug use: Not Currently    Frequency: 7.0 times per week    Types: Cocaine, Heroin    Comment: uses heroin for pain 09/03/18. Last cocaine 08/02/18    Review of Systems  Review of Systems  Constitutional:  Positive for chills, fever and malaise/fatigue.  HENT:  Negative for sore throat.   Eyes:  Negative for pain.  Respiratory:  Positive for cough and shortness of breath. Negative for stridor.   Cardiovascular:  Positive for chest pain.  Gastrointestinal:  Positive for nausea and vomiting.  Genitourinary:  Negative for dysuria.  Musculoskeletal:  Positive for back pain and myalgias. Negative for joint pain.  Skin:  Negative for rash.  Neurological:  Negative for seizures, loss of consciousness and headaches.  Psychiatric/Behavioral:  Negative for suicidal ideas.   All other systems reviewed and are negative.    ____________________________________________   PHYSICAL EXAM:  VITAL SIGNS: ED Triage Vitals  Enc Vitals Group     BP 08/24/20 2213 (!) 122/95     Pulse Rate 08/24/20 2213 (!) 144     Resp 08/24/20 2213 (!) 24     Temp 08/24/20 2213 (!) 101.3 F (38.5 C)     Temp Source  08/24/20 2213 Oral     SpO2 08/24/20 2213 100 %     Weight 08/24/20 2213 250 lb (113.4 kg)     Height 08/24/20 2213 '5\' 2"'  (1.575 m)     Head Circumference --      Peak Flow --      Pain Score 08/24/20 2214 10     Pain Loc --      Pain Edu? --      Excl. in [place]? --    Vitals:   08/24/20 2300  08/25/20 0001  BP: 131/75   Pulse: (!) 127   Resp: 20   Temp:  (!) 100.7 F (38.2 C)  SpO2: 93%    Physical Exam Vitals and nursing note reviewed.  Constitutional:      General: She is in acute distress.     Appearance: She is well-developed. She is ill-appearing.  HENT:     Head: Normocephalic and atraumatic.     Right Ear: External ear normal.     Left Ear: External ear normal.     Mouth/Throat:     Mouth: Mucous membranes are dry.  Eyes:     Conjunctiva/sclera: Conjunctivae normal.  Cardiovascular:     Rate and Rhythm: Regular rhythm. Tachycardia present.     Heart sounds: No murmur heard. Pulmonary:     Effort: Pulmonary effort is normal. No respiratory distress.     Breath sounds: Normal breath sounds.  Abdominal:     Palpations: Abdomen is soft.     Tenderness: There is no abdominal tenderness.  Musculoskeletal:     Cervical back: Neck supple.  Skin:    General: Skin is warm and dry.     Capillary Refill: Capillary refill takes more than 3 seconds.  Neurological:     Mental Status: She is alert and oriented to person, place, and time.  Psychiatric:        Mood and Affect: Mood normal.    Patient is fairly tender over her L-spine.  No overlying skin changes or palpable fluctuance.  Rectal tone is intact.  Patient has symmetric strength in her lower extremities.  2+ patellar reflexes.  No tenderness over the C or T-spine.  Patient has symmetric strength in upper extremities.  Abdomen is soft nontender throughout.  No CVA tenderness.  Sensation is intact in the distribution of the radial ulnar and median nerves in the bilateral upper extremities.  Patient has a circular area  of erythema approximately coin size at the base of the right thumb.  She has forage motion of the thumb in the area does not extend over the proximal interphalangeal joint.  No significant fluctuance there is some warmth and tenderness. ____________________________________________   LABS (all labs ordered are listed, but only abnormal results are displayed)  Labs Reviewed  COMPREHENSIVE METABOLIC PANEL - Abnormal; Notable for the following components:      Result Value   Sodium 134 (*)    Potassium 3.0 (*)    Chloride 96 (*)    Glucose, Bld 156 (*)    Albumin 3.0 (*)    ALT 58 (*)    All other components within normal limits  LACTIC ACID, PLASMA - Abnormal; Notable for the following components:   Lactic Acid, Venous 2.3 (*)    All other components within normal limits  CBC WITH DIFFERENTIAL/PLATELET - Abnormal; Notable for the following components:   WBC 11.1 (*)    RBC 5.36 (*)    MCV 73.1 (*)    MCH 22.9 (*)    RDW 21.9 (*)    Neutro Abs 9.6 (*)    Abs Immature Granulocytes 0.08 (*)    All other components within normal limits  URINALYSIS, COMPLETE (UACMP) WITH MICROSCOPIC - Abnormal; Notable for the following components:   Color, Urine YELLOW (*)    APPearance HAZY (*)    Hgb urine dipstick MODERATE (*)    Nitrite POSITIVE (*)    Leukocytes,Ua TRACE (*)    Bacteria, UA MANY (*)    All other components  within normal limits  MAGNESIUM - Abnormal; Notable for the following components:   Magnesium 1.2 (*)    All other components within normal limits  SEDIMENTATION RATE - Abnormal; Notable for the following components:   Sed Rate 59 (*)    All other components within normal limits  RESP PANEL BY RT-PCR (FLU A&B, COVID) ARPGX2  CULTURE, BLOOD (ROUTINE X 2)  CULTURE, BLOOD (ROUTINE X 2)  CULTURE, BLOOD (SINGLE)  URINE CULTURE  PROTIME-INR  PROCALCITONIN  URINE DRUG SCREEN, QUALITATIVE ([facility] ONLY)  HCG, QUANTITATIVE, PREGNANCY  LACTIC ACID, PLASMA  PROCALCITONIN  HIV  ANTIBODY (ROUTINE TESTING W REFLEX)  APTT  POC URINE PREG, ED  TROPONIN I (HIGH SENSITIVITY)   ____________________________________________  EKG  Sinus tachycardia with a rate of 131, unremarkable intervals some nonspecific changes in inferior leads without other clear evidence of acute ischemia or significant arrhythmia. ____________________________________________  RADIOLOGY  ED MD interpretation: Chest x-ray has no evidence of effusion, edema, pneumothorax or other clear acute intrathoracic process.  Official radiology report(s): DG Chest 1 View  Result Date: 08/24/2020 CLINICAL DATA:  Sepsis EXAM: CHEST  1 VIEW COMPARISON:  12/21/2018 FINDINGS: The heart size and mediastinal contours are within normal limits. Both lungs are clear. The visualized skeletal structures are unremarkable. IMPRESSION: Normal study. Electronically Signed   By: Rolm Baptise M.D.   On: 08/24/2020 22:57    ____________________________________________   PROCEDURES  Procedure(s) performed (including Critical Care):  .Critical Care  Date/Time: 08/25/2020 12:18 AM Performed by: Lucrezia Starch, MD Authorized by: Lucrezia Starch, MD   Critical care provider statement:    Critical care time (minutes):  45   Critical care was necessary to treat or prevent imminent or life-threatening deterioration of the following conditions:  Sepsis   Critical care was time spent personally by me on the following activities:  Discussions with consultants, evaluation of patient's response to treatment, examination of patient, ordering and performing treatments and interventions, ordering and review of laboratory studies, ordering and review of radiographic studies, pulse oximetry, re-evaluation of patient's condition, obtaining history from patient or surrogate and review of old charts   ____________________________________________   INITIAL IMPRESSION / [place] / ED COURSE      Patient presents with  above-stated history exam for assessment of shortness of breath, chest pain, vomiting, and and severe back pain in the setting of ongoing IV drug abuse and a fall onto her lower back 4 days ago.  On arrival she is febrile at 101.3, tachycardic at 144, tachypneic at 24 with a BP of 122/95 in setting 90% room air.  With regard to her shortness of breath and chest discomfort as well as increased cough from baseline differential includes pneumonia, PE, ACS, myocarditis, pericarditis and possible endocarditis.   With regard to her vomiting and severe low back pain differential includes pyelonephritis, kidney stone, epidural abscess or discitis, diverticulitis, appendicitis, cholecystitis and pancreatitis.  Cholecystitis pancreatitis and appendicitis I think are less likely given absence of abdominal pain and diarrhea.  Chest x-ray is unremarkable for evidence of pneumonia or other acute abnormalities.  CMP remarkable for K of 3, ALT of 58 without any other significant electrolyte or metabolic derangements.  No evidence of cholestasis.  Initial lactic acid is slightly elevated at 2.3.  CBC shows mild leukocytosis with WBC count of 11.1 with normal hemoglobin and platelets.  INR is WNL.  Urinalysis is positive for nitrites with trace leukocyte esterase and 21-50 WBCs and many bacteria visualized.  This concerning for cystitis.  COVID and influenza PCR is negative.  Magnesium 1.2.  Procalcitonin elevated at 5.58.  ESR 59.  hCG is negative.  To assess for possible PE obtain CTA chest.  To assess for possible evidence of pyelonephritis stone or other acute infectious abdominal process will obtain CT abdomen pelvis as well.  To assess for possible epidural abscess or discitis will obtain MR C/T/L-spine.  We will have a lower suspicion for ACS will obtain troponin to assess for ACS or myocarditis.  Blood and urine cultures ordered.  Will give [place] cefepime and start fluid resuscitation with 2 L of IV  fluids.  Care patient signed over to oncoming provider approximately 2330.  Plan is to follow-up imaging studies and remaining labs including troponin and admit.     ____________________________________________   FINAL CLINICAL IMPRESSION(S) / ED DIAGNOSES  Final diagnoses:  Back pain  Sepsis, due to unspecified organism, unspecified whether acute organ dysfunction present [facility])  Chest pain, unspecified type  Acute cystitis without hematuria  Cellulitis of right hand  IV drug abuse (HCC)  Hypomagnesemia  Hypokalemia    Medications  vancomycin (VANCOCIN) IVPB 1000 mg/200 mL premix (1,000 mg Intravenous New Bag/Given 08/24/20 2345)    Followed by  Vancomycin (VANCOCIN) 1,500 mg in sodium chloride 0.9 % 500 mL IVPB (has no administration in time range)  magnesium sulfate IVPB 4 g 100 mL (has no administration in time range)  potassium chloride SA (KLOR-CON) CR tablet 40 mEq (has no administration in time range)  lactated ringers bolus 1,000 mL (has no administration in time range)  lactated ringers bolus 1,000 mL (0 mLs Intravenous Stopped 08/25/20 0007)  morphine 4 MG/ML injection 6 mg (6 mg Intravenous Given 08/24/20 2343)  ceFEPIme (MAXIPIME) 2 g in sodium chloride 0.9 % 100 mL IVPB (0 g Intravenous Stopped 08/25/20 0024)  iohexol (OMNIPAQUE) 350 MG/ML injection 100 mL (100 mLs Intravenous Contrast Given 08/24/20 2354)     ED Discharge Orders     None        Note:  This document was prepared using Dragon voice recognition software and may include unintentional dictation errors.    Lucrezia Starch, MD 08/25/20 [phone]

## 2020-08-24 NOTE — Progress Notes (Signed)
PHARMACY -  BRIEF ANTIBIOTIC NOTE   Pharmacy has received consult(s) for Cefepime and Vancomycin from an ED provider.  The patient's profile has been reviewed for ht/wt/allergies/indication/available labs.    One time order(s) placed for Cefepime 2 gm and Vancomycin 2500 mg per pt wt : 113.4 kg  Further antibiotics/pharmacy consults should be ordered by admitting physician if indicated.                       Thank you, Otelia Sergeant, PharmD, [facility] 08/24/2020 10:40 PM

## 2020-08-25 ENCOUNTER — Emergency Department: Payer: Disability Insurance

## 2020-08-25 ENCOUNTER — Encounter: Payer: Self-pay | Admitting: Internal Medicine

## 2020-08-25 DIAGNOSIS — F1010 Alcohol abuse, uncomplicated: Secondary | ICD-10-CM

## 2020-08-25 DIAGNOSIS — F17200 Nicotine dependence, unspecified, uncomplicated: Secondary | ICD-10-CM

## 2020-08-25 DIAGNOSIS — M545 Low back pain, unspecified: Secondary | ICD-10-CM | POA: Diagnosis not present

## 2020-08-25 DIAGNOSIS — R911 Solitary pulmonary nodule: Secondary | ICD-10-CM

## 2020-08-25 DIAGNOSIS — R112 Nausea with vomiting, unspecified: Secondary | ICD-10-CM

## 2020-08-25 DIAGNOSIS — A419 Sepsis, unspecified organism: Secondary | ICD-10-CM

## 2020-08-25 DIAGNOSIS — N390 Urinary tract infection, site not specified: Secondary | ICD-10-CM | POA: Diagnosis present

## 2020-08-25 DIAGNOSIS — F418 Other specified anxiety disorders: Secondary | ICD-10-CM

## 2020-08-25 DIAGNOSIS — F1990 Other psychoactive substance use, unspecified, uncomplicated: Secondary | ICD-10-CM

## 2020-08-25 DIAGNOSIS — M4624 Osteomyelitis of vertebra, thoracic region: Secondary | ICD-10-CM | POA: Diagnosis not present

## 2020-08-25 DIAGNOSIS — Z8709 Personal history of other diseases of the respiratory system: Secondary | ICD-10-CM

## 2020-08-25 DIAGNOSIS — R7881 Bacteremia: Secondary | ICD-10-CM | POA: Diagnosis present

## 2020-08-25 DIAGNOSIS — R079 Chest pain, unspecified: Secondary | ICD-10-CM

## 2020-08-25 DIAGNOSIS — I10 Essential (primary) hypertension: Secondary | ICD-10-CM

## 2020-08-25 DIAGNOSIS — E876 Hypokalemia: Secondary | ICD-10-CM

## 2020-08-25 DIAGNOSIS — F1910 Other psychoactive substance abuse, uncomplicated: Secondary | ICD-10-CM

## 2020-08-25 DIAGNOSIS — L03113 Cellulitis of right upper limb: Secondary | ICD-10-CM | POA: Diagnosis present

## 2020-08-25 LAB — PROCALCITONIN: Procalcitonin: 8.5 ng/mL

## 2020-08-25 LAB — BLOOD CULTURE ID PANEL (REFLEXED) - BCID2

## 2020-08-25 LAB — URINE DRUG SCREEN, QUALITATIVE (ARMC ONLY)
Amphetamines, Ur Screen: NOT DETECTED
Barbiturates, Ur Screen: NOT DETECTED
Benzodiazepine, Ur Scrn: NOT DETECTED
Cannabinoid 50 Ng, Ur ~~LOC~~: NOT DETECTED
Cocaine Metabolite,Ur ~~LOC~~: NOT DETECTED
MDMA (Ecstasy)Ur Screen: NOT DETECTED
Methadone Scn, Ur: NOT DETECTED
Opiate, Ur Screen: NOT DETECTED
Phencyclidine (PCP) Ur S: NOT DETECTED
Tricyclic, Ur Screen: NOT DETECTED

## 2020-08-25 LAB — URINALYSIS, COMPLETE (UACMP) WITH MICROSCOPIC
Bilirubin Urine: NEGATIVE
Glucose, UA: NEGATIVE mg/dL
Ketones, ur: NEGATIVE mg/dL
Nitrite: POSITIVE — AB
Protein, ur: NEGATIVE mg/dL
Specific Gravity, Urine: 1.01 (ref 1.005–1.030)
pH: 7 (ref 5.0–8.0)

## 2020-08-25 LAB — TROPONIN I (HIGH SENSITIVITY): Troponin I (High Sensitivity): 7 ng/L (ref <18)

## 2020-08-25 LAB — APTT: aPTT: 31 seconds (ref 24–36)

## 2020-08-25 LAB — HIV ANTIBODY (ROUTINE TESTING W REFLEX): HIV Screen 4th Generation wRfx: NONREACTIVE

## 2020-08-25 LAB — LIPASE, BLOOD: Lipase: 24 U/L (ref 11–51)

## 2020-08-25 LAB — LACTIC ACID, PLASMA: Lactic Acid, Venous: 0.8 mmol/L (ref 0.5–1.9)

## 2020-08-25 LAB — PHOSPHORUS: Phosphorus: 2.6 mg/dL (ref 2.5–4.6)

## 2020-08-25 LAB — C-REACTIVE PROTEIN: CRP: 19.2 mg/dL — ABNORMAL HIGH (ref <1.0)

## 2020-08-25 IMAGING — MR MR THORACIC SPINE WO/W CM
7 of 9 series · 28 of 48 positions shown · IV contrast (10ml Gadavist)
Comparison: None available.

CLINICAL DATA: Initial evaluation for epidural abscess. Fever with
mid back pain, history of IVDU.

EXAM:
MRI CERVICAL, THORACIC AND LUMBAR SPINE WITHOUT AND WITH CONTRAST
TECHNIQUE: Multiplanar and multiecho pulse sequences of the cervical spine, to
include the craniocervical junction and cervicothoracic junction,
and thoracic and lumbar spine, were obtained without and with
intravenous contrast.
CONTRAST:  10mL GADAVIST GADOBUTROL 1 MMOL/ML IV SOLN

[Series 16: T2 · sagittal · 3.0mm · 1.33mm/px · 3 of 19 slices shown (1 of 2)]
[im 1/19]
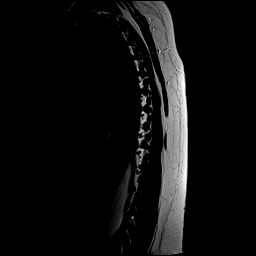
[im 10/19]
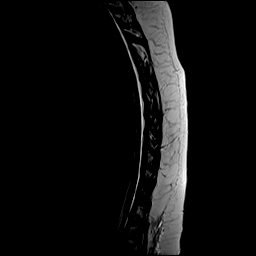
[im 19/19]
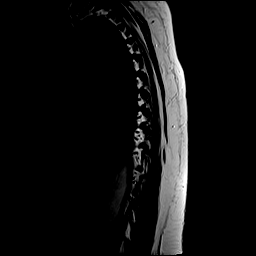

[Series 17: T1 · sagittal · 3.0mm · 1.33mm/px · 3 of 19 slices shown (1 of 2)]
[im 1/19]
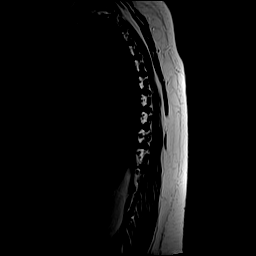
[im 10/19]
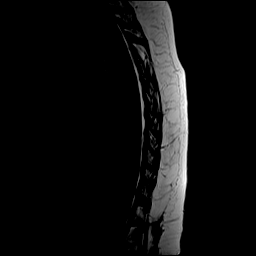
[im 19/19]
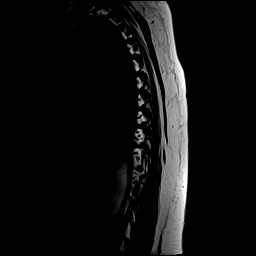

[Series 18: STIR · sagittal · 3.0mm · 0.66mm/px · 4 of 19 slices shown]
[im 1/19]
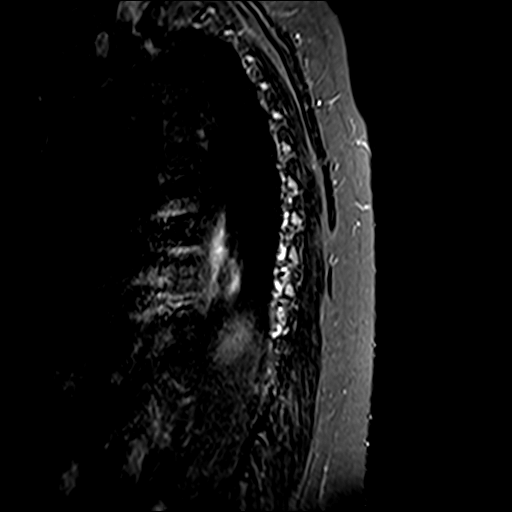
[im 7/19]
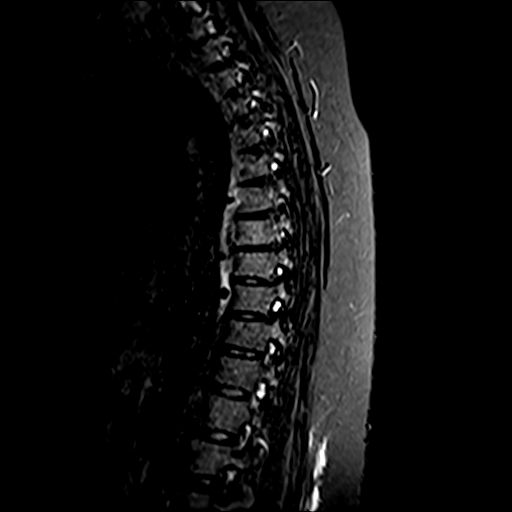
[im 13/19]
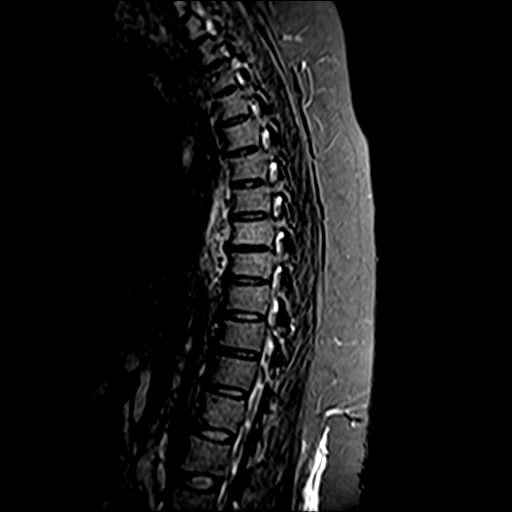
[im 19/19]
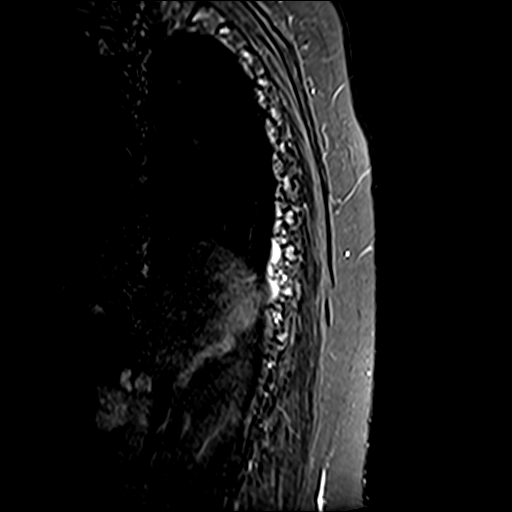

[Series 19: T2 · axial · 4.0mm · 0.59mm/px · z∈[-295,-95]mm · 8 of 39 slices shown (2 of 2)]
[im 1/39]
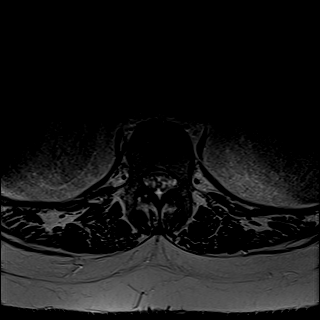
[im 6/39]
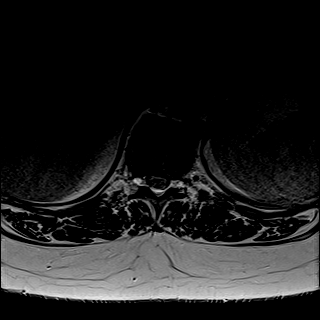
[im 11/39]
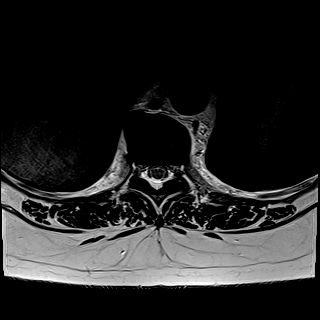
[im 17/39]
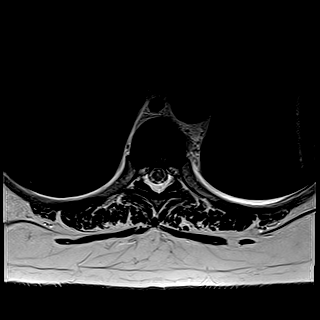
[im 22/39]
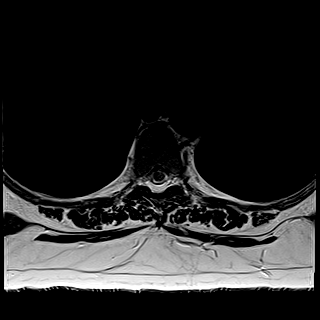
[im 28/39]
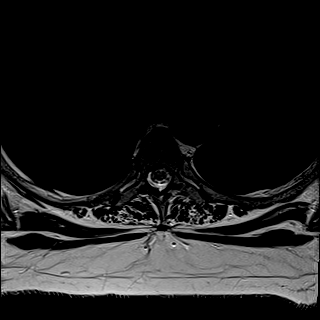
[im 33/39]
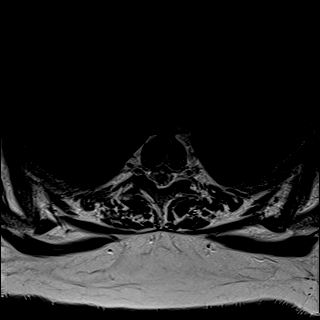
[im 39/39]
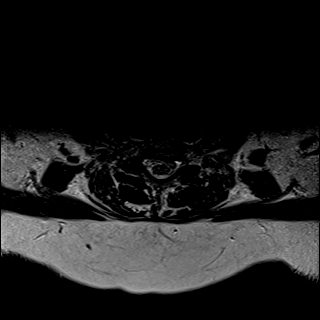

[Series 21: T1 post-contrast · axial · non-contrast · 4.0mm · 0.37mm/px · z∈[-295,-196]mm · 4 of 39 slices shown]
[im 1/39]
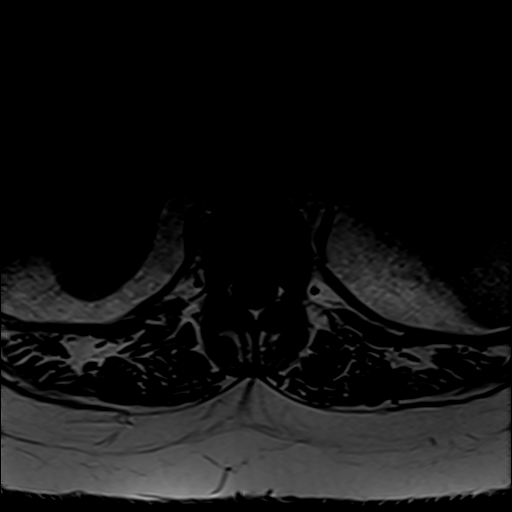
[im 6/39]
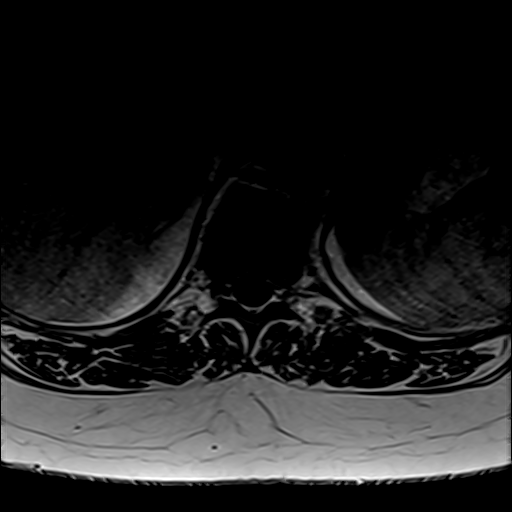
[im 11/39]
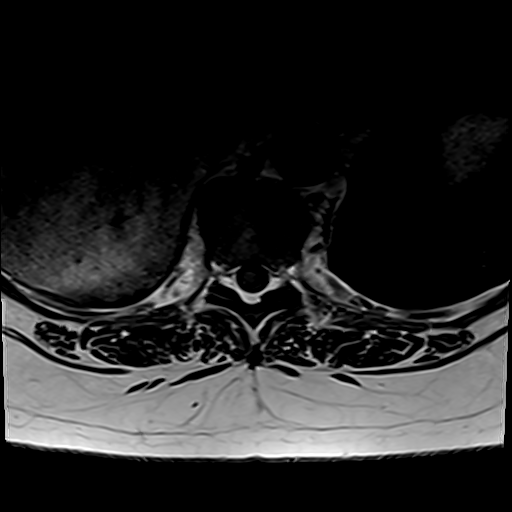
[im 17/39]
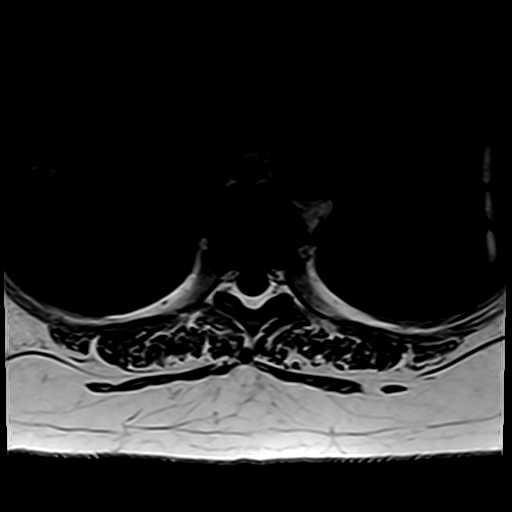

[Series 22: T1 fat-sat post-contrast · sagittal · 3.0mm · 1.33mm/px · 4 of 19 slices shown]
[im 1/19]
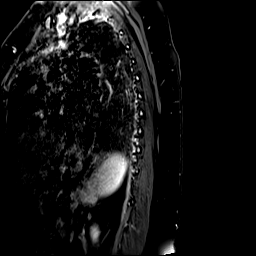
[im 7/19]
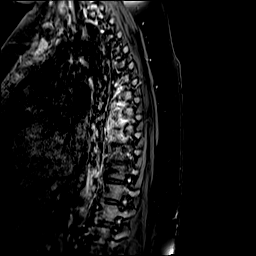
[im 13/19]
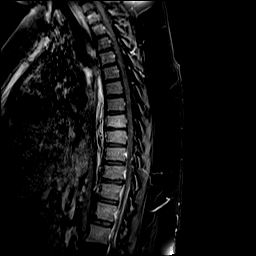
[im 19/19]
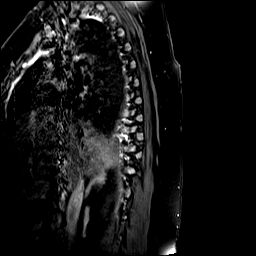

[Series 27: T1 · sagittal · 5.0mm · 1.88mm/px · 2 of 9 slices shown (2 of 2)]
[im 1/9]
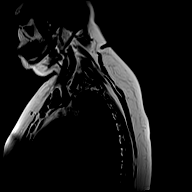
[im 9/9]
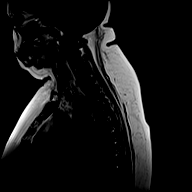

[28 of 48 positions shown; findings below may reference images not displayed]

FINDINGS: MRI CERVICAL SPINE FINDINGS

Alignment: Straightening of the normal cervical lordosis. No
listhesis.

Vertebrae: Vertebral body height maintained without acute or chronic
fracture. Bone marrow signal intensity diffusely decreased on T1
weighted imaging, nonspecific, but most commonly related to anemia,
smoking, or obesity. No discrete or worrisome osseous lesions. No
abnormal marrow edema or enhancement. No evidence for osteomyelitis
discitis or septic arthritis.

Cord: Normal signal morphology. No epidural abscess or other
collection. No abnormal enhancement.

Posterior Fossa, vertebral arteries, paraspinal tissues: 6 mm
retention cyst noted at the nasopharynx. Otherwise unremarkable.

Disc levels:

C5-6: Degenerative intervertebral disc space narrowing with diffuse
disc osteophyte complex. Flattening and partial effacement of the
ventral thecal sac with resultant mild spinal stenosis. Moderate
bilateral C6 foraminal narrowing.

No other significant disc pathology seen within the cervical spine.
No other stenosis or impingement.

MRI THORACIC SPINE FINDINGS

Alignment: Physiologic with preservation of the normal thoracic
kyphosis. No listhesis.

Vertebrae: Vertebral body height maintained without acute or chronic
fracture. Diffusely decreased T1 signal intensity seen throughout
the visualized bone marrow. No worrisome osseous lesions.

There is question of subtle paraspinous stranding and edema adjacent
to the T8-9 interspace, best seen on axial sequences (series 19,
image 26 and series 20, image 25, and series 21, images 26, 28).
Suggestion of associated enhancement (series 23, image 27). Given
the history of fever and mid back pain, changes are concerning for
possible early/developing paraspinous infection. Subtle edema and
enhancement seen within the adjacent endplates about the T8-9
interspace, which could reflect developing osteomyelitis. No
convincing evidence for acute discitis within the intervening T8-9
disc itself at this time. No convincing epidural extension or
enhancement.

Otherwise, no other convincing evidence for acute infection seen
elsewhere within the thoracic spine.

Cord: Normal signal and morphology. No epidural abscess or
enhancement. Diffuse prominence of the dorsal epidural fat noted.

Paraspinal and other soft tissues: Subtle paraspinous stranding,
edema, and enhancement adjacent to the T8-9 interspace as above. No
discrete soft tissue collections. Trace layering bilateral pleural
effusions, left slightly larger than right. Paraspinous soft tissues
otherwise unremarkable.

Disc levels:

T8-9: Disc bulge with reactive endplate change and superimposed
small left paracentral disc protrusion (series 20, image 26). Mild
flattening of the left ventral cord without cord signal changes or
significant spinal stenosis.

T11-12: Disc bulge with reactive endplate spurring. Mild flattening
of the ventral thecal sac. Superimposed mild facet hypertrophy
without significant spinal stenosis. Foramina remain patent.

Otherwise, no significant disc pathology seen elsewhere within the
thoracic spine. No other significant canal or foraminal stenosis. No
neural impingement.

MRI LUMBAR SPINE FINDINGS

Segmentation: Standard. Lowest well-formed disc space labeled the
L5-S1 level.

Alignment: Physiologic with preservation of the normal lumbar
lordosis. No listhesis.

Vertebrae: Vertebral body height maintained without acute or chronic
fracture. Diffusely decreased T1 weighted signal seen throughout the
visualized bone marrow. No discrete or worrisome osseous lesions. No
abnormal marrow edema or enhancement. No evidence for osteomyelitis
discitis or septic arthritis within the lumbar spine.

Conus medullaris and cauda equina: Conus extends to the L1 level.
Conus and cauda equina appear normal.

Paraspinal and other soft tissues: Paraspinous soft tissues
demonstrate no acute finding. Asymmetric fatty atrophy noted
involving the left psoas muscle. Multifocal cortical scarring noted
about the left greater than right kidneys. Visualized visceral
structures otherwise unremarkable.

Disc levels:

L1-2: Anterior endplate spurring without significant disc bulge.
Mild facet hypertrophy. No stenosis or impingement.

L2-3:  Negative interspace.  Mild facet hypertrophy.  No stenosis.

L3-4:  Negative interspace.  Mild facet hypertrophy.  No stenosis.

L4-5: Negative interspace. Mild facet hypertrophy with trace joint
effusion on the right. No stenosis.

L5-S1: Negative interspace. Moderate right worse than left facet
hypertrophy with associated prominent joint effusion on the right.
No significant surrounding inflammatory changes to suggest septic
arthritis. Mild epidural lipomatosis. No stenosis or impingement.
IMPRESSION: 1. Question subtle paraspinous stranding and edema adjacent to the
T8-9 interspace as above. Given the history of fever and mid back
pain, changes are concerning for possible early/developing
paraspinous infection. Question early changes of osteomyelitis about
the adjacent T8-9 interspace. No discrete or drainable fluid
collections.
2. No other evidence for acute infection elsewhere within the
cervical, thoracic, or lumbar spine. No epidural abscess.
3. Degenerative disc disease at C5-6 with resultant mild spinal
stenosis and moderate bilateral C6 foraminal narrowing.
4. Small left paracentral disc protrusion at T8-9 with mild
flattening of the left hemi cord. No significant spinal stenosis.
5. Moderate right worse than left facet hypertrophy at L5-S1 with
associated prominent joint effusion on the right. No significant
surrounding inflammatory changes to suggest septic arthritis.
Finding could contribute to lower back pain.

## 2020-08-25 IMAGING — MR MR CERVICAL SPINE WO/W CM
7 of 10 series · 29 of 48 positions shown · IV contrast (gadavist)
Comparison: None available.

CLINICAL DATA: Initial evaluation for epidural abscess. Fever with
mid back pain, history of IVDU.

EXAM:
MRI CERVICAL, THORACIC AND LUMBAR SPINE WITHOUT AND WITH CONTRAST
TECHNIQUE: Multiplanar and multiecho pulse sequences of the cervical spine, to
include the craniocervical junction and cervicothoracic junction,
and thoracic and lumbar spine, were obtained without and with
intravenous contrast.
CONTRAST:  10mL GADAVIST GADOBUTROL 1 MMOL/ML IV SOLN

[Series 5: T1 · sagittal · 5.0mm · 1.88mm/px · 2 of 9 slices shown (1 of 3)]
[im 1/9]
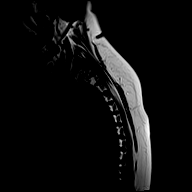
[im 9/9]
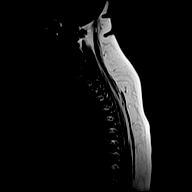

[Series 10: T2 · axial · 3.0mm · 0.70mm/px · z∈[-95,-4]mm · 7 of 29 slices shown (1 of 2)]
[im 1/29]
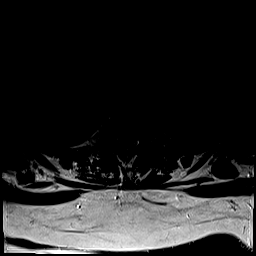
[im 5/29]
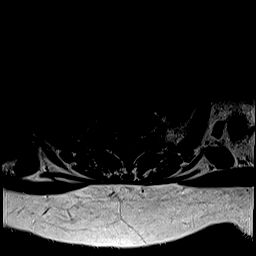
[im 10/29]
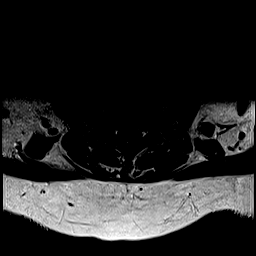
[im 15/29]
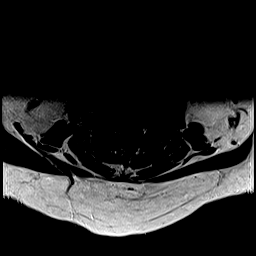
[im 19/29]
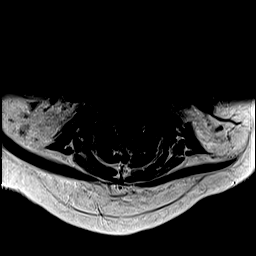
[im 24/29]
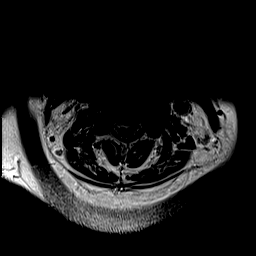
[im 29/29]
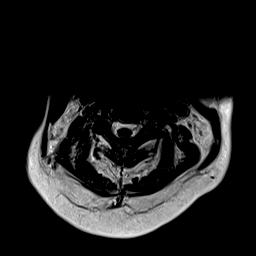

[Series 27: T1 · sagittal · 5.0mm · 1.88mm/px · 2 of 9 slices shown (2 of 3)]
[im 1/9]
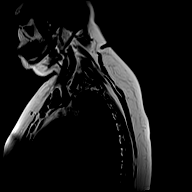
[im 9/9]
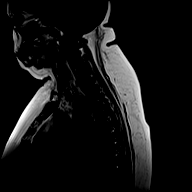

[Series 28: STIR · sagittal · 3.0mm · 0.77mm/px · 4 of 15 slices shown]
[im 1/15]
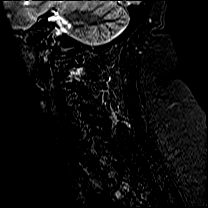
[im 5/15]
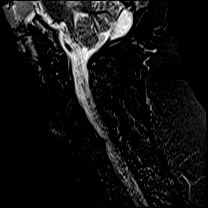
[im 10/15]
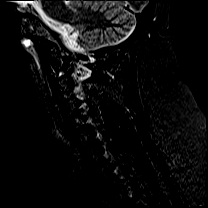
[im 15/15]
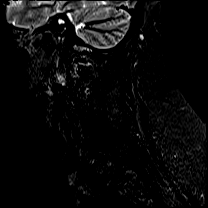

[Series 29: T2 · sagittal · 3.0mm · 0.50mm/px · 4 of 15 slices shown (2 of 2)]
[im 1/15]
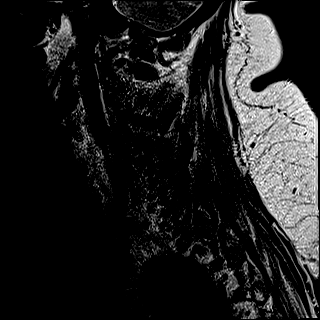
[im 5/15]
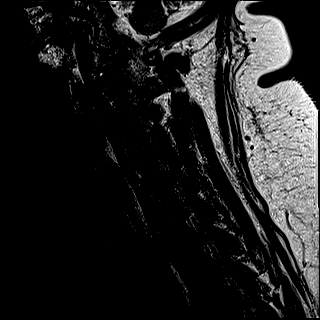
[im 10/15]
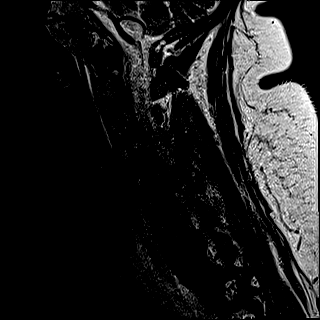
[im 15/15]
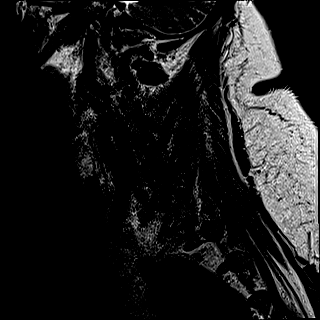

[Series 30: T1 · axial · non-contrast · 3.0mm · 0.35mm/px · z∈[-112,-30]mm · 7 of 26 slices shown (3 of 3)]
[im 1/26]
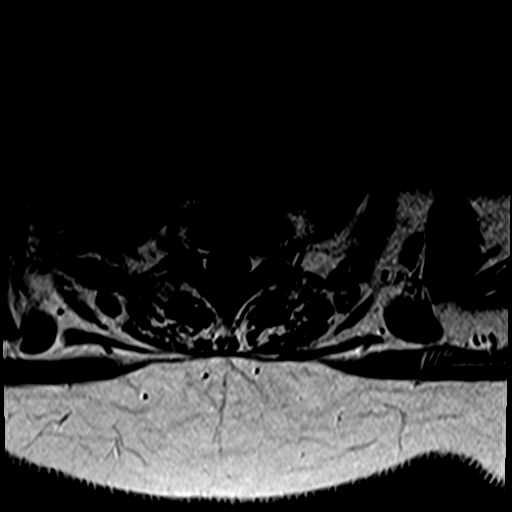
[im 5/26]
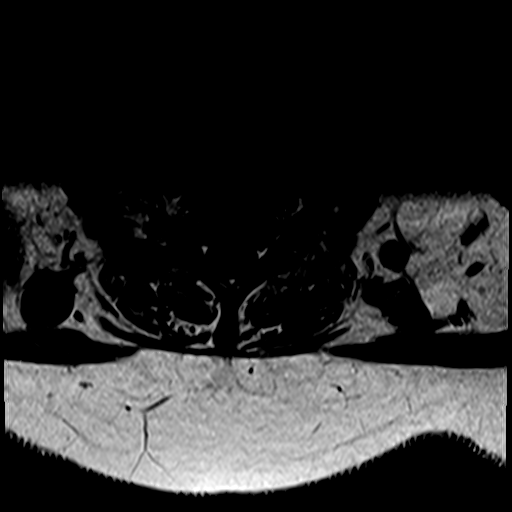
[im 9/26]
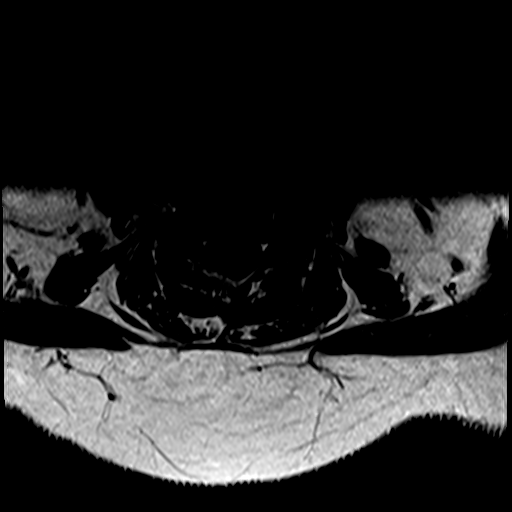
[im 13/26]
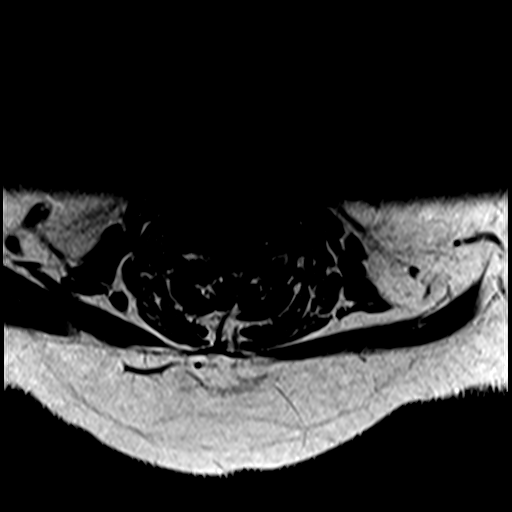
[im 17/26]
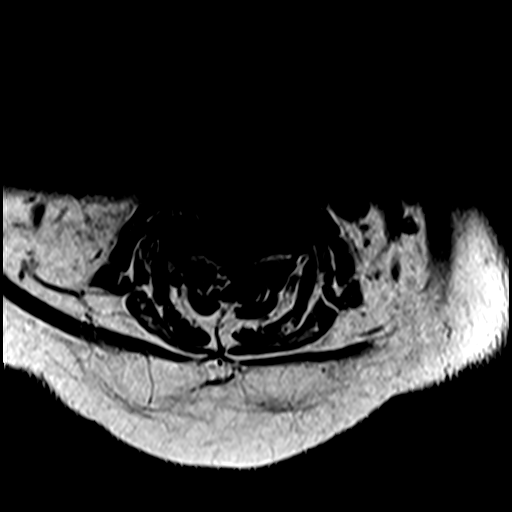
[im 21/26]
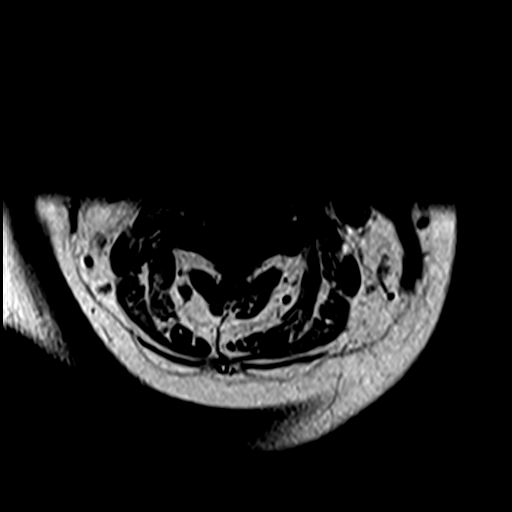
[im 26/26]
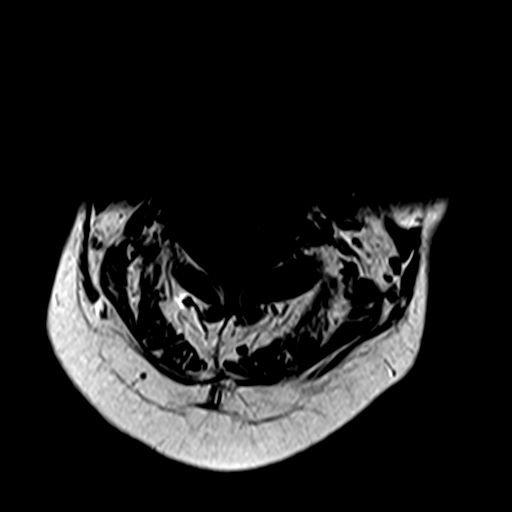

[Series 36: T1 post-contrast · axial · 3.0mm · 0.35mm/px · z∈[-117,-87]mm · 3 of 29 slices shown]
[im 1/29]
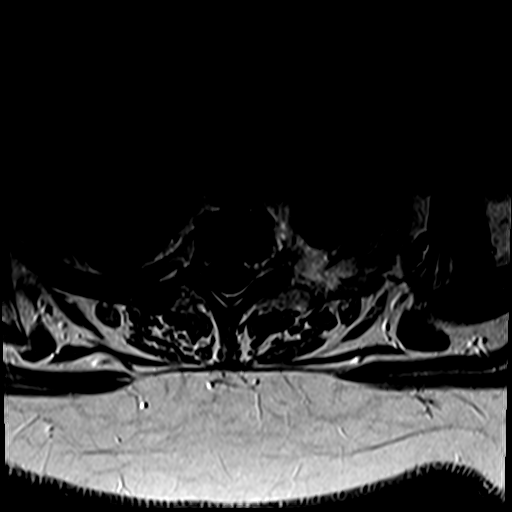
[im 5/29]
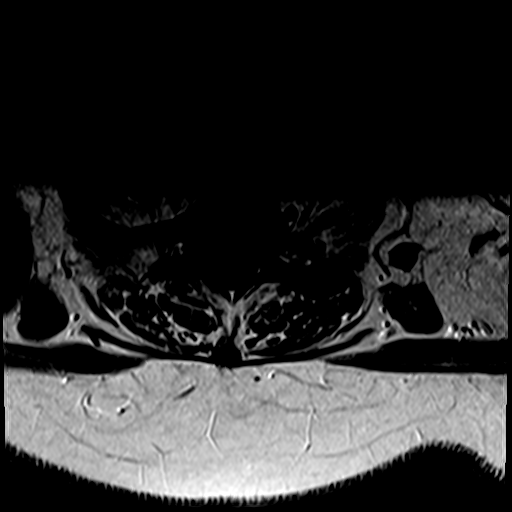
[im 10/29]
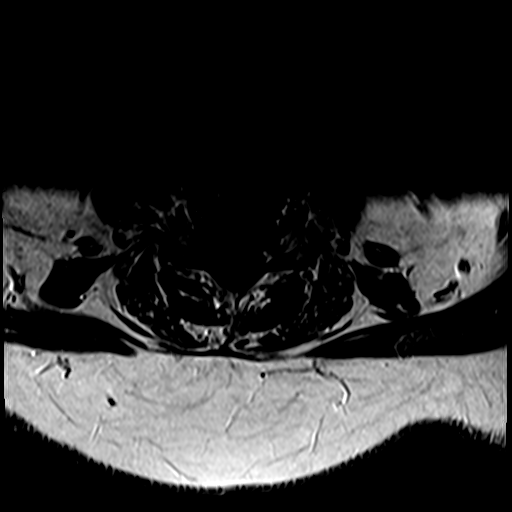

[29 of 48 positions shown; findings below may reference images not displayed]

FINDINGS: MRI CERVICAL SPINE FINDINGS

Alignment: Straightening of the normal cervical lordosis. No
listhesis.

Vertebrae: Vertebral body height maintained without acute or chronic
fracture. Bone marrow signal intensity diffusely decreased on T1
weighted imaging, nonspecific, but most commonly related to anemia,
smoking, or obesity. No discrete or worrisome osseous lesions. No
abnormal marrow edema or enhancement. No evidence for osteomyelitis
discitis or septic arthritis.

Cord: Normal signal morphology. No epidural abscess or other
collection. No abnormal enhancement.

Posterior Fossa, vertebral arteries, paraspinal tissues: 6 mm
retention cyst noted at the nasopharynx. Otherwise unremarkable.

Disc levels:

C5-6: Degenerative intervertebral disc space narrowing with diffuse
disc osteophyte complex. Flattening and partial effacement of the
ventral thecal sac with resultant mild spinal stenosis. Moderate
bilateral C6 foraminal narrowing.

No other significant disc pathology seen within the cervical spine.
No other stenosis or impingement.

MRI THORACIC SPINE FINDINGS

Alignment: Physiologic with preservation of the normal thoracic
kyphosis. No listhesis.

Vertebrae: Vertebral body height maintained without acute or chronic
fracture. Diffusely decreased T1 signal intensity seen throughout
the visualized bone marrow. No worrisome osseous lesions.

There is question of subtle paraspinous stranding and edema adjacent
to the T8-9 interspace, best seen on axial sequences (series 19,
image 26 and series 20, image 25, and series 21, images 26, 28).
Suggestion of associated enhancement (series 23, image 27). Given
the history of fever and mid back pain, changes are concerning for
possible early/developing paraspinous infection. Subtle edema and
enhancement seen within the adjacent endplates about the T8-9
interspace, which could reflect developing osteomyelitis. No
convincing evidence for acute discitis within the intervening T8-9
disc itself at this time. No convincing epidural extension or
enhancement.

Otherwise, no other convincing evidence for acute infection seen
elsewhere within the thoracic spine.

Cord: Normal signal and morphology. No epidural abscess or
enhancement. Diffuse prominence of the dorsal epidural fat noted.

Paraspinal and other soft tissues: Subtle paraspinous stranding,
edema, and enhancement adjacent to the T8-9 interspace as above. No
discrete soft tissue collections. Trace layering bilateral pleural
effusions, left slightly larger than right. Paraspinous soft tissues
otherwise unremarkable.

Disc levels:

T8-9: Disc bulge with reactive endplate change and superimposed
small left paracentral disc protrusion (series 20, image 26). Mild
flattening of the left ventral cord without cord signal changes or
significant spinal stenosis.

T11-12: Disc bulge with reactive endplate spurring. Mild flattening
of the ventral thecal sac. Superimposed mild facet hypertrophy
without significant spinal stenosis. Foramina remain patent.

Otherwise, no significant disc pathology seen elsewhere within the
thoracic spine. No other significant canal or foraminal stenosis. No
neural impingement.

MRI LUMBAR SPINE FINDINGS

Segmentation: Standard. Lowest well-formed disc space labeled the
L5-S1 level.

Alignment: Physiologic with preservation of the normal lumbar
lordosis. No listhesis.

Vertebrae: Vertebral body height maintained without acute or chronic
fracture. Diffusely decreased T1 weighted signal seen throughout the
visualized bone marrow. No discrete or worrisome osseous lesions. No
abnormal marrow edema or enhancement. No evidence for osteomyelitis
discitis or septic arthritis within the lumbar spine.

Conus medullaris and cauda equina: Conus extends to the L1 level.
Conus and cauda equina appear normal.

Paraspinal and other soft tissues: Paraspinous soft tissues
demonstrate no acute finding. Asymmetric fatty atrophy noted
involving the left psoas muscle. Multifocal cortical scarring noted
about the left greater than right kidneys. Visualized visceral
structures otherwise unremarkable.

Disc levels:

L1-2: Anterior endplate spurring without significant disc bulge.
Mild facet hypertrophy. No stenosis or impingement.

L2-3:  Negative interspace.  Mild facet hypertrophy.  No stenosis.

L3-4:  Negative interspace.  Mild facet hypertrophy.  No stenosis.

L4-5: Negative interspace. Mild facet hypertrophy with trace joint
effusion on the right. No stenosis.

L5-S1: Negative interspace. Moderate right worse than left facet
hypertrophy with associated prominent joint effusion on the right.
No significant surrounding inflammatory changes to suggest septic
arthritis. Mild epidural lipomatosis. No stenosis or impingement.
IMPRESSION: 1. Question subtle paraspinous stranding and edema adjacent to the
T8-9 interspace as above. Given the history of fever and mid back
pain, changes are concerning for possible early/developing
paraspinous infection. Question early changes of osteomyelitis about
the adjacent T8-9 interspace. No discrete or drainable fluid
collections.
2. No other evidence for acute infection elsewhere within the
cervical, thoracic, or lumbar spine. No epidural abscess.
3. Degenerative disc disease at C5-6 with resultant mild spinal
stenosis and moderate bilateral C6 foraminal narrowing.
4. Small left paracentral disc protrusion at T8-9 with mild
flattening of the left hemi cord. No significant spinal stenosis.
5. Moderate right worse than left facet hypertrophy at L5-S1 with
associated prominent joint effusion on the right. No significant
surrounding inflammatory changes to suggest septic arthritis.
Finding could contribute to lower back pain.

## 2020-08-25 IMAGING — MR MR LUMBAR SPINE WO/W CM
5 of 7 series · 30 of 48 positions shown · IV contrast (gadavist)
Comparison: None available.

CLINICAL DATA: Initial evaluation for epidural abscess. Fever with
mid back pain, history of IVDU.

EXAM:
MRI CERVICAL, THORACIC AND LUMBAR SPINE WITHOUT AND WITH CONTRAST
TECHNIQUE: Multiplanar and multiecho pulse sequences of the cervical spine, to
include the craniocervical junction and cervicothoracic junction,
and thoracic and lumbar spine, were obtained without and with
intravenous contrast.
CONTRAST:  10mL GADAVIST GADOBUTROL 1 MMOL/ML IV SOLN

[Series 5: T2 · sagittal · 4.0mm · 1.02mm/px · 5 of 15 slices shown (1 of 2)]
[im 1/15]
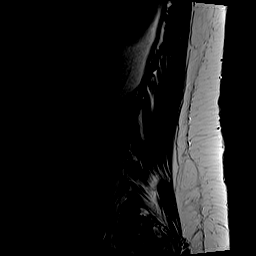
[im 4/15]
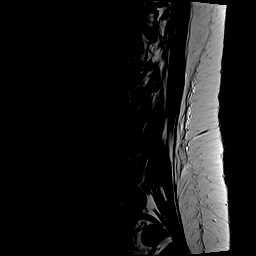
[im 8/15]
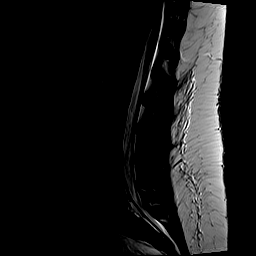
[im 11/15]
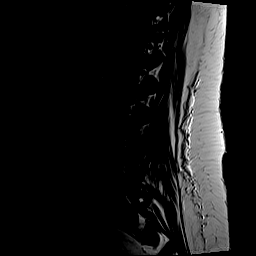
[im 15/15]
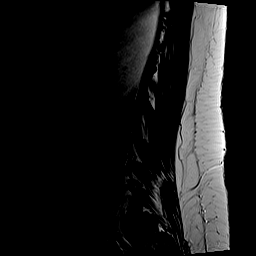

[Series 6: T1 · sagittal · 4.0mm · 1.02mm/px · 5 of 15 slices shown (1 of 2)]
[im 1/15]
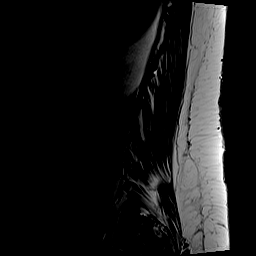
[im 4/15]
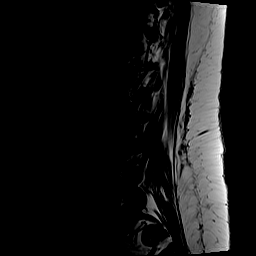
[im 8/15]
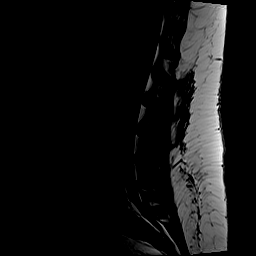
[im 11/15]
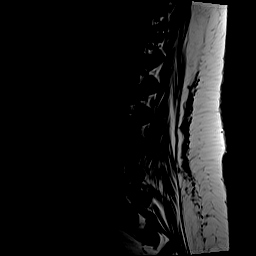
[im 15/15]
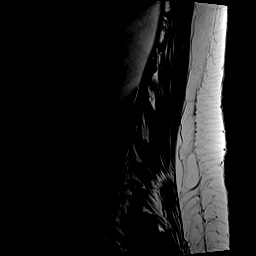

[Series 8: T2 · axial · 4.0mm · 0.78mm/px · z∈[-519,-293]mm · 8 of 36 slices shown (2 of 2)]
[im 1/36]
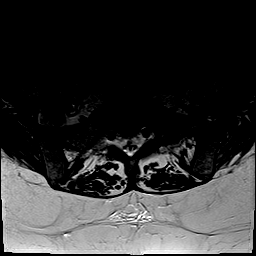
[im 4/36]
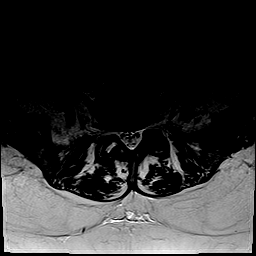
[im 12/36]
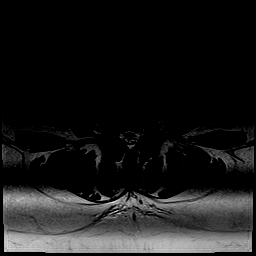
[im 16/36]
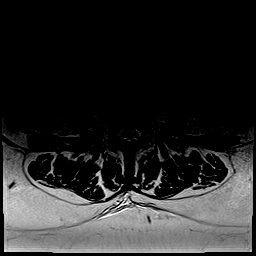
[im 20/36]
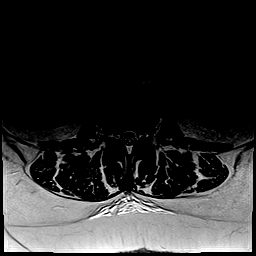
[im 24/36]
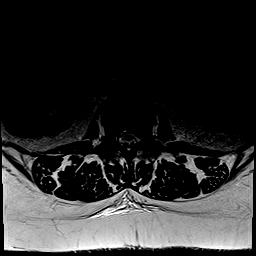
[im 32/36]
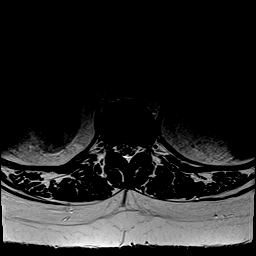
[im 36/36]
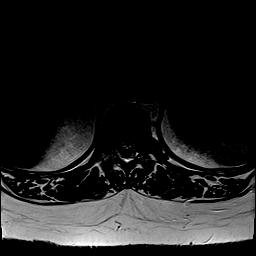

[Series 9: T1 · axial · 4.0mm · 0.39mm/px · z∈[-519,-293]mm · 8 of 36 slices shown (2 of 2)]
[im 1/36]
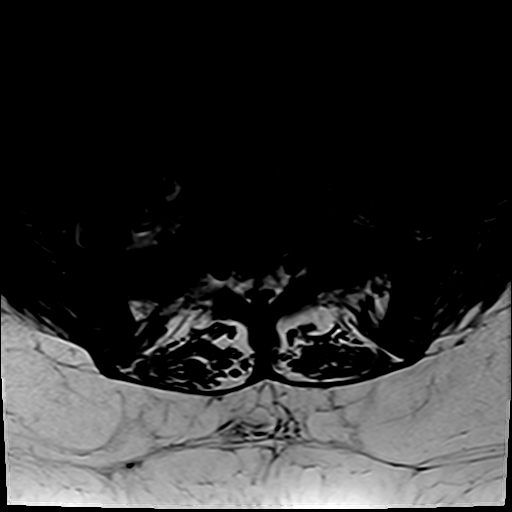
[im 4/36]
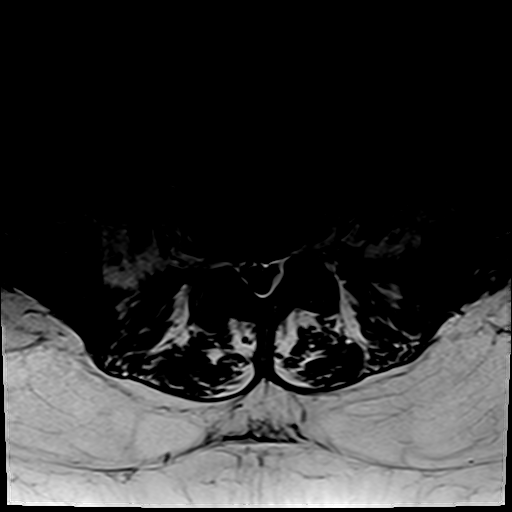
[im 12/36]
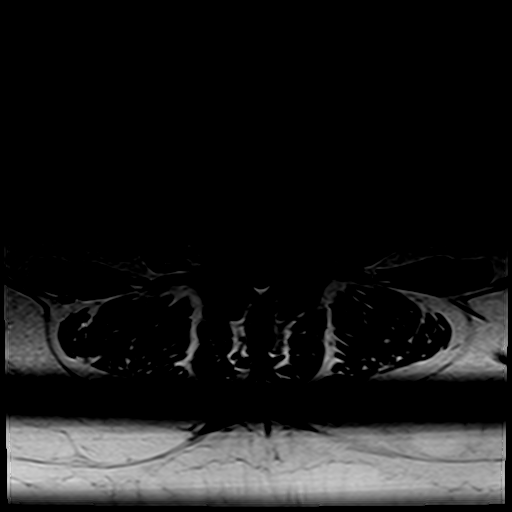
[im 16/36]
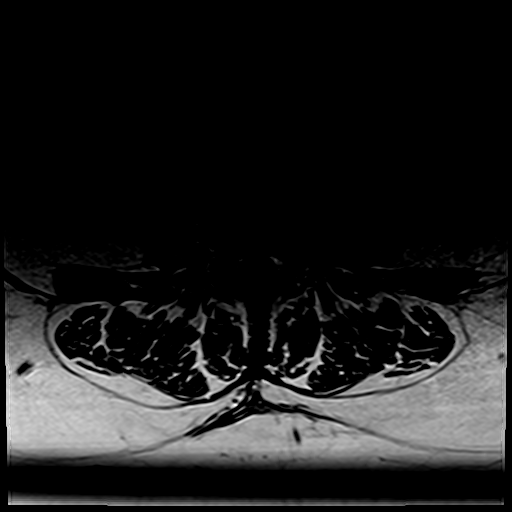
[im 20/36]
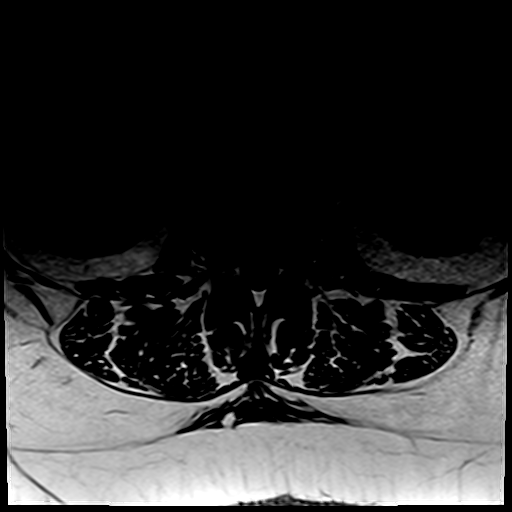
[im 24/36]
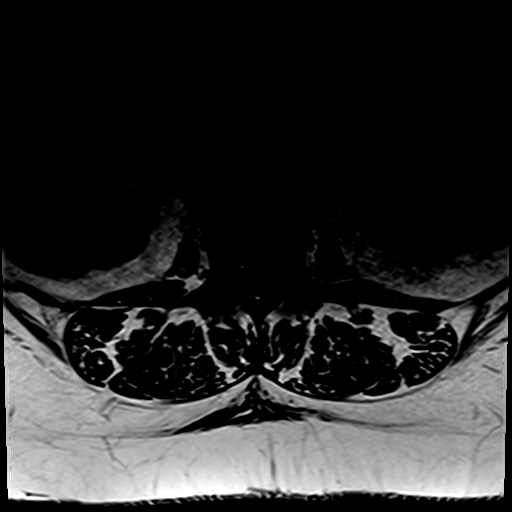
[im 32/36]
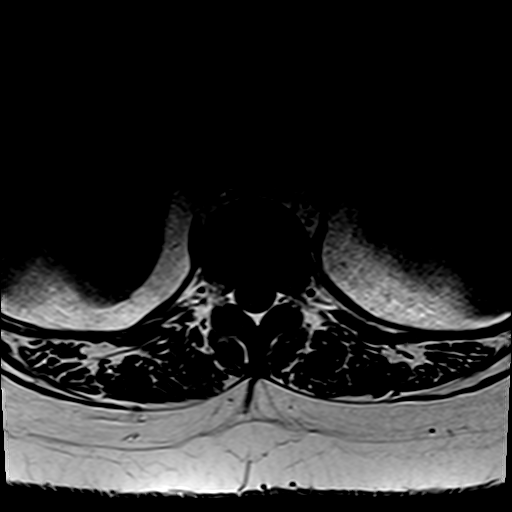
[im 36/36]
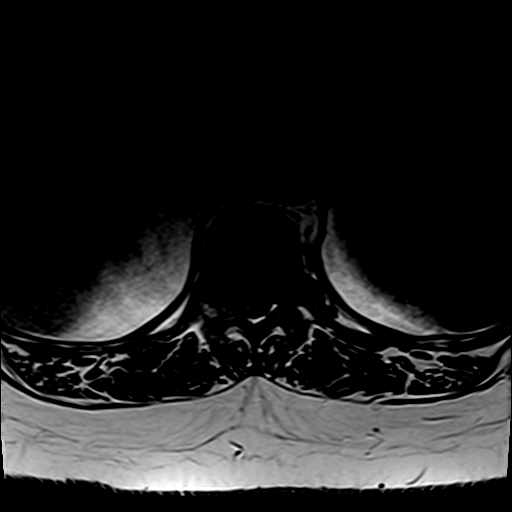

[Series 10: T1 fat-sat post-contrast · sagittal · 4.0mm · 1.02mm/px · 4 of 15 slices shown]
[im 1/15]
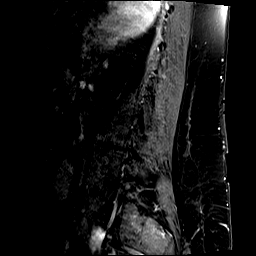
[im 5/15]
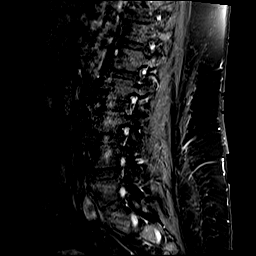
[im 10/15]
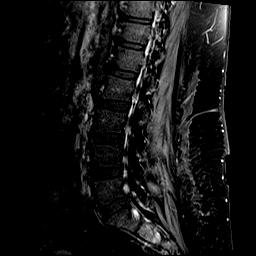
[im 15/15]
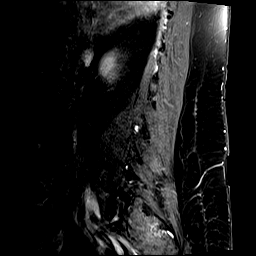

[30 of 48 positions shown; findings below may reference images not displayed]

FINDINGS: MRI CERVICAL SPINE FINDINGS

Alignment: Straightening of the normal cervical lordosis. No
listhesis.

Vertebrae: Vertebral body height maintained without acute or chronic
fracture. Bone marrow signal intensity diffusely decreased on T1
weighted imaging, nonspecific, but most commonly related to anemia,
smoking, or obesity. No discrete or worrisome osseous lesions. No
abnormal marrow edema or enhancement. No evidence for osteomyelitis
discitis or septic arthritis.

Cord: Normal signal morphology. No epidural abscess or other
collection. No abnormal enhancement.

Posterior Fossa, vertebral arteries, paraspinal tissues: 6 mm
retention cyst noted at the nasopharynx. Otherwise unremarkable.

Disc levels:

C5-6: Degenerative intervertebral disc space narrowing with diffuse
disc osteophyte complex. Flattening and partial effacement of the
ventral thecal sac with resultant mild spinal stenosis. Moderate
bilateral C6 foraminal narrowing.

No other significant disc pathology seen within the cervical spine.
No other stenosis or impingement.

MRI THORACIC SPINE FINDINGS

Alignment: Physiologic with preservation of the normal thoracic
kyphosis. No listhesis.

Vertebrae: Vertebral body height maintained without acute or chronic
fracture. Diffusely decreased T1 signal intensity seen throughout
the visualized bone marrow. No worrisome osseous lesions.

There is question of subtle paraspinous stranding and edema adjacent
to the T8-9 interspace, best seen on axial sequences (series 19,
image 26 and series 20, image 25, and series 21, images 26, 28).
Suggestion of associated enhancement (series 23, image 27). Given
the history of fever and mid back pain, changes are concerning for
possible early/developing paraspinous infection. Subtle edema and
enhancement seen within the adjacent endplates about the T8-9
interspace, which could reflect developing osteomyelitis. No
convincing evidence for acute discitis within the intervening T8-9
disc itself at this time. No convincing epidural extension or
enhancement.

Otherwise, no other convincing evidence for acute infection seen
elsewhere within the thoracic spine.

Cord: Normal signal and morphology. No epidural abscess or
enhancement. Diffuse prominence of the dorsal epidural fat noted.

Paraspinal and other soft tissues: Subtle paraspinous stranding,
edema, and enhancement adjacent to the T8-9 interspace as above. No
discrete soft tissue collections. Trace layering bilateral pleural
effusions, left slightly larger than right. Paraspinous soft tissues
otherwise unremarkable.

Disc levels:

T8-9: Disc bulge with reactive endplate change and superimposed
small left paracentral disc protrusion (series 20, image 26). Mild
flattening of the left ventral cord without cord signal changes or
significant spinal stenosis.

T11-12: Disc bulge with reactive endplate spurring. Mild flattening
of the ventral thecal sac. Superimposed mild facet hypertrophy
without significant spinal stenosis. Foramina remain patent.

Otherwise, no significant disc pathology seen elsewhere within the
thoracic spine. No other significant canal or foraminal stenosis. No
neural impingement.

MRI LUMBAR SPINE FINDINGS

Segmentation: Standard. Lowest well-formed disc space labeled the
L5-S1 level.

Alignment: Physiologic with preservation of the normal lumbar
lordosis. No listhesis.

Vertebrae: Vertebral body height maintained without acute or chronic
fracture. Diffusely decreased T1 weighted signal seen throughout the
visualized bone marrow. No discrete or worrisome osseous lesions. No
abnormal marrow edema or enhancement. No evidence for osteomyelitis
discitis or septic arthritis within the lumbar spine.

Conus medullaris and cauda equina: Conus extends to the L1 level.
Conus and cauda equina appear normal.

Paraspinal and other soft tissues: Paraspinous soft tissues
demonstrate no acute finding. Asymmetric fatty atrophy noted
involving the left psoas muscle. Multifocal cortical scarring noted
about the left greater than right kidneys. Visualized visceral
structures otherwise unremarkable.

Disc levels:

L1-2: Anterior endplate spurring without significant disc bulge.
Mild facet hypertrophy. No stenosis or impingement.

L2-3:  Negative interspace.  Mild facet hypertrophy.  No stenosis.

L3-4:  Negative interspace.  Mild facet hypertrophy.  No stenosis.

L4-5: Negative interspace. Mild facet hypertrophy with trace joint
effusion on the right. No stenosis.

L5-S1: Negative interspace. Moderate right worse than left facet
hypertrophy with associated prominent joint effusion on the right.
No significant surrounding inflammatory changes to suggest septic
arthritis. Mild epidural lipomatosis. No stenosis or impingement.
IMPRESSION: 1. Question subtle paraspinous stranding and edema adjacent to the
T8-9 interspace as above. Given the history of fever and mid back
pain, changes are concerning for possible early/developing
paraspinous infection. Question early changes of osteomyelitis about
the adjacent T8-9 interspace. No discrete or drainable fluid
collections.
2. No other evidence for acute infection elsewhere within the
cervical, thoracic, or lumbar spine. No epidural abscess.
3. Degenerative disc disease at C5-6 with resultant mild spinal
stenosis and moderate bilateral C6 foraminal narrowing.
4. Small left paracentral disc protrusion at T8-9 with mild
flattening of the left hemi cord. No significant spinal stenosis.
5. Moderate right worse than left facet hypertrophy at L5-S1 with
associated prominent joint effusion on the right. No significant
surrounding inflammatory changes to suggest septic arthritis.
Finding could contribute to lower back pain.

## 2020-08-25 IMAGING — CT CT ANGIO CHEST
2 of 6 series · 15 of 46 positions shown · IV contrast (APPLIED)
Comparison: None.

CLINICAL DATA: Pulmonary embolism, mid and low back pain, vomiting,
fever, hematuria

EXAM:
CT ANGIOGRAPHY CHEST
CT ABDOMEN AND PELVIS WITH CONTRAST
TECHNIQUE: Multidetector CT imaging of the chest was performed using the
standard protocol during bolus administration of intravenous
contrast. Multiplanar CT image reconstructions and MIPs were
obtained to evaluate the vascular anatomy. Multidetector CT imaging
of the abdomen and pelvis was performed using the standard protocol
during bolus administration of intravenous contrast.
CONTRAST:  100mL OMNIPAQUE IOHEXOL 350 MG/ML SOLN

[Series 5: thins · axial · 0.78mm/px · z∈[-654,-442]mm · 12 of 235 slices shown]
[im 11/235  lung]
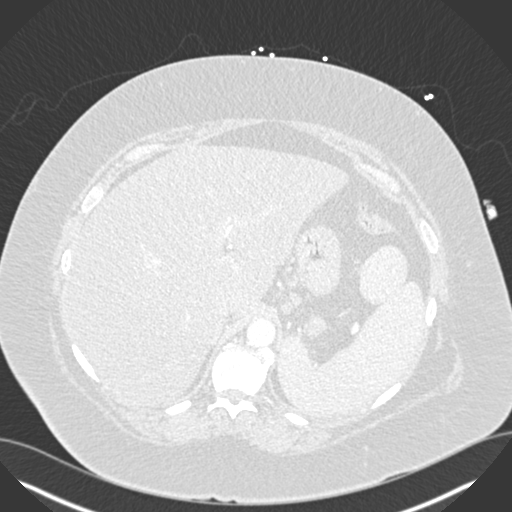
[im 31/235  soft-tissue]
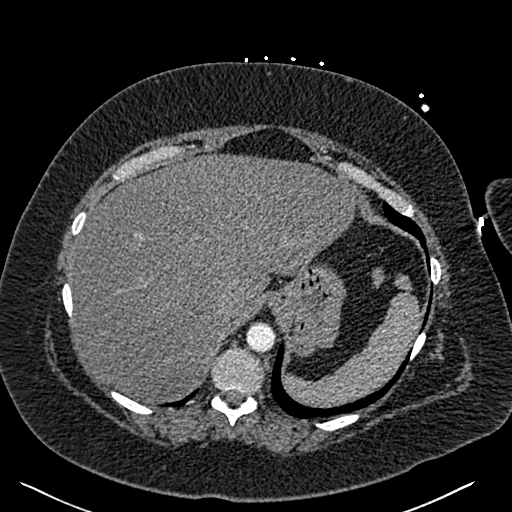
[im 51/235  lung]
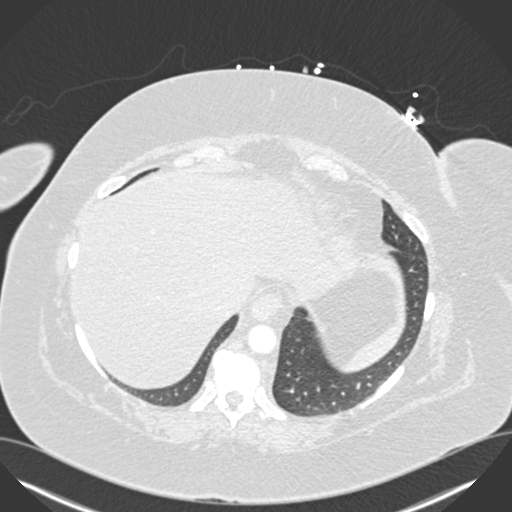
[im 72/235  soft-tissue]
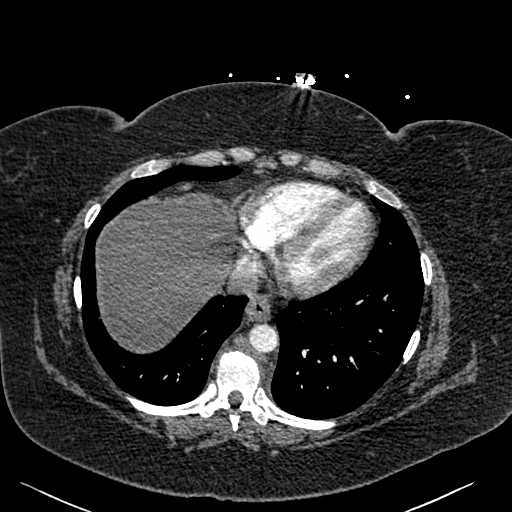
[im 92/235  lung]
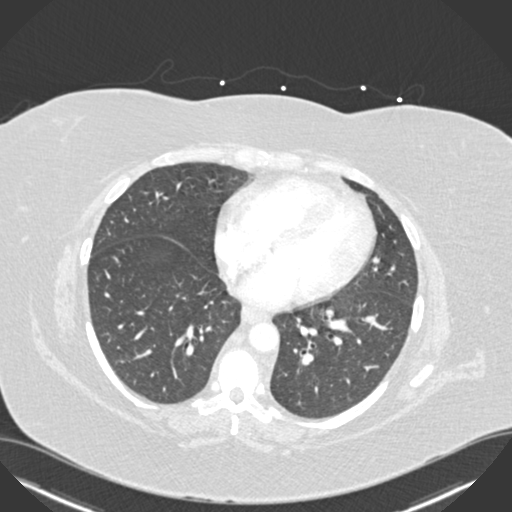
[im 112/235  soft-tissue]
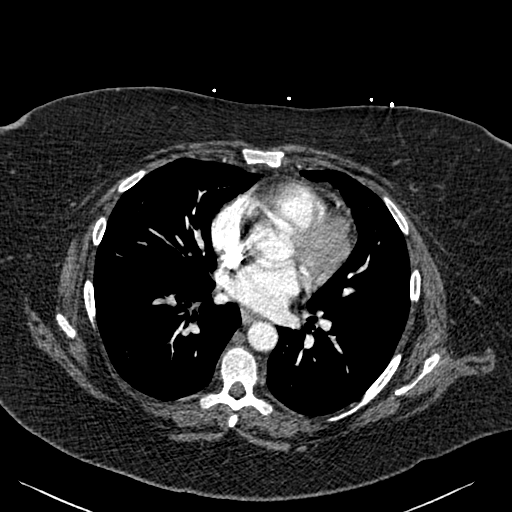
[im 123/235  lung]
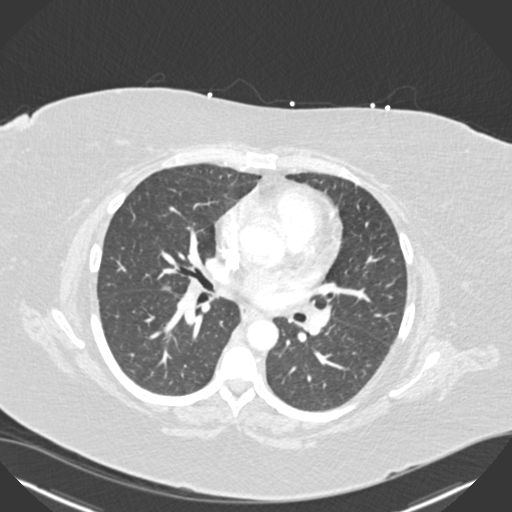
[im 143/235  soft-tissue]
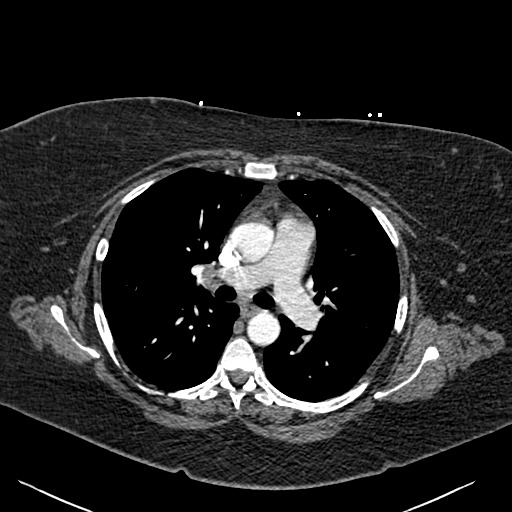
[im 163/235  lung]
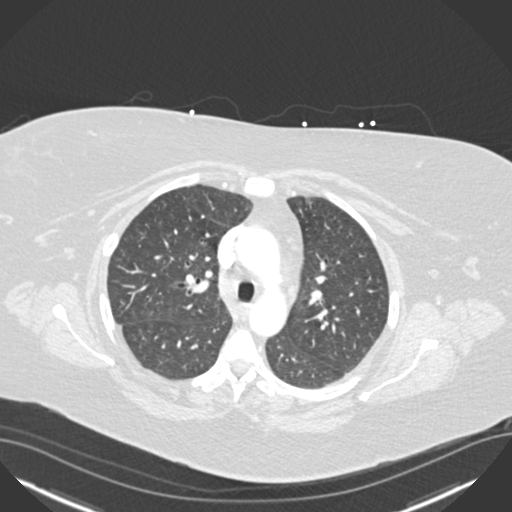
[im 184/235  soft-tissue]
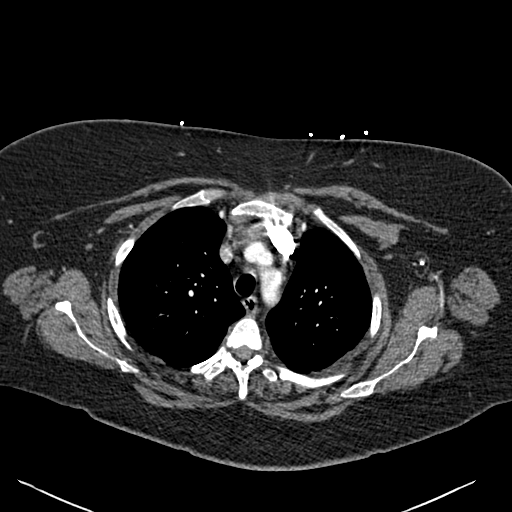
[im 204/235  lung]
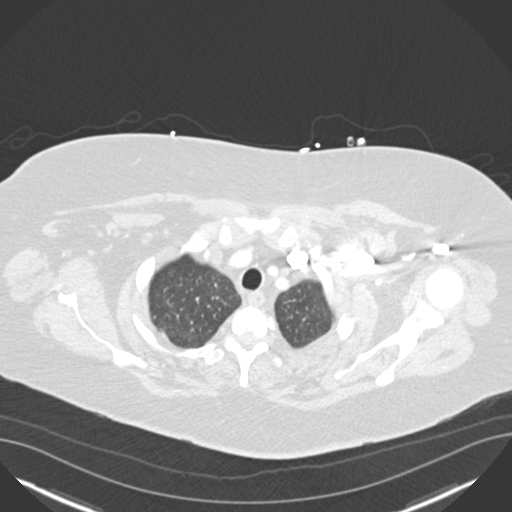
[im 224/235  soft-tissue]
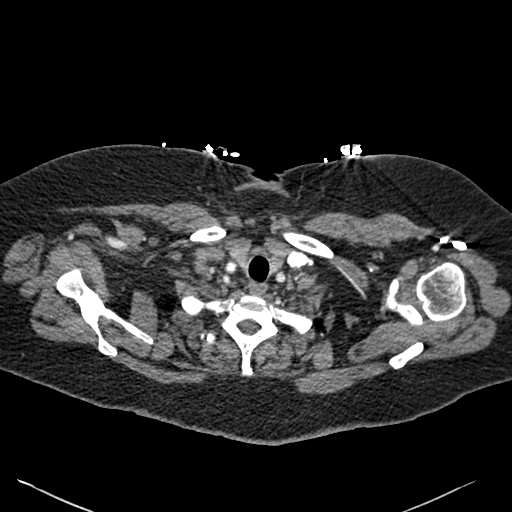

[Series 7: coronal mpr · coronal · 0.47mm/px · 3 of 150 slices shown]
[im 38/150  soft-tissue]
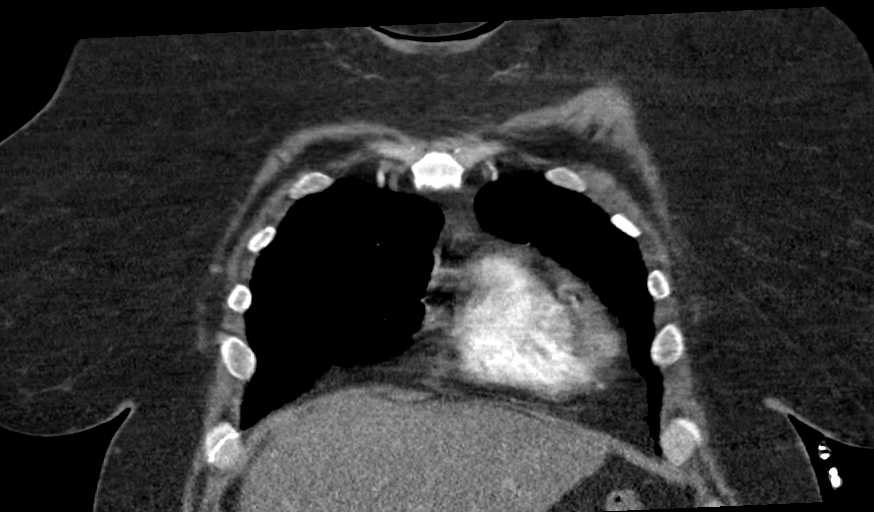
[im 75/150  soft-tissue]
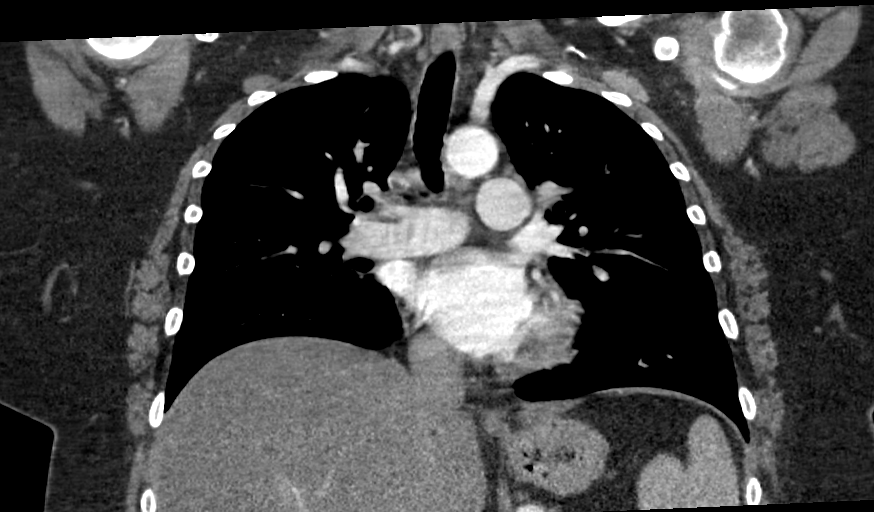
[im 112/150  soft-tissue]
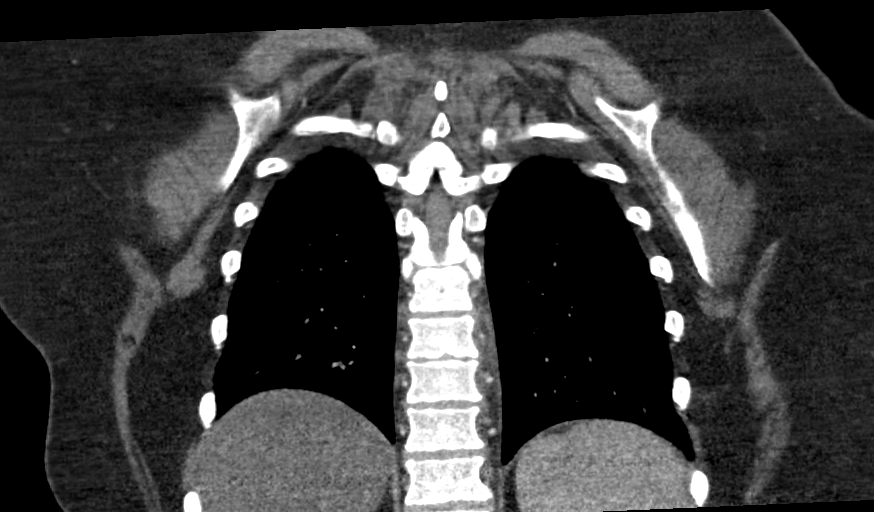

[15 of 46 positions shown; findings below may reference images not displayed]

FINDINGS: CTA CHEST FINDINGS

Cardiovascular: There is suboptimal opacification of the pulmonary
arterial tree with preferential opacification of the thoracic aorta.
The examination is adequate only for exclusion of intraluminal
filling defect within the main, right, left, and lobar pulmonary
arteries, of which there are none. The segmental and subsegmental
pulmonary arteries are not well assessed on this examination.
Central pulmonary arteries are of normal caliber.

Cardiac size within normal limits. No pericardial effusion. No
significant coronary artery calcification. The thoracic aorta is
unremarkable.

Mediastinum/Nodes: No enlarged mediastinal, hilar, or axillary lymph
nodes. Thyroid gland, trachea, and esophagus demonstrate no
significant findings.

Lungs/Pleura: 7 mm subpleural ground-glass pulmonary nodule within
the right middle lobe, axial image # 38/6 is indeterminate. The
lungs are otherwise clear. No pneumothorax or pleural effusion. No
central obstructing lesion.

Musculoskeletal: No acute bone abnormality. No lytic or blastic bone
lesion.

Review of the MIP images confirms the above findings.

CT ABDOMEN and PELVIS FINDINGS

Hepatobiliary: Moderate hepatic steatosis. No focal intrahepatic
mass. No intra or extrahepatic biliary ductal dilation. Gallbladder
unremarkable.

Pancreas: Unremarkable

Spleen: Unremarkable

Adrenals/Urinary Tract: The adrenal glands are unremarkable. The
kidneys are normal in position. There has developed asymmetric
bilateral renal cortical scarring and asymmetric atrophy, left
greater than right. No hydronephrosis. No intrarenal or ureteral
calculi. The bladder is partially obscured, however, the visualized
segment is unremarkable.

Stomach/Bowel: Stomach is within normal limits. Appendix appears
normal. No evidence of bowel wall thickening, distention, or
inflammatory changes. No free intraperitoneal gas or fluid

Vascular/Lymphatic: Mild aortoiliac atherosclerotic calcification.
No aortic aneurysm. No pathologic adenopathy within the abdomen and
pelvis.

Reproductive: Uterus and bilateral adnexa are unremarkable.

Other: Tiny fat containing umbilical hernia. The rectum is
unremarkable.

Musculoskeletal: Right total hip arthroplasty has been performed. No
acute bone abnormality. Degenerative changes are seen within the
lumbosacral junction.

Review of the MIP images confirms the above findings.
IMPRESSION: Technically limited examination with suboptimal opacification of the
pulmonary arterial tree. No evidence of pulmonary embolism through
the level of the lobar pulmonary arteries. The segmental and
subsegmental pulmonary arteries are not well assessed.

7 mm indeterminate right middle lobe ground-glass pulmonary nodule.
Initial follow-up with CT at 6-12 months is recommended to confirm
persistence. If persistent, repeat CT is recommended every 2 years
until 5 years of stability has been established. This recommendation
follows the consensus statement: Guidelines for Management of
Incidental Pulmonary Nodules Detected on CT Images: From the

Moderate hepatic steatosis.

Interval resolution of previously noted bilateral hydronephrosis and
perinephric stranding. Interval development of asymmetric bilateral
cortical scarring and atrophy, left greater than right.

Aortic Atherosclerosis ([V3]-[V3]).

## 2020-08-25 IMAGING — CT CT ABD-PELV W/ CM
2 of 5 series · 15 of 46 positions shown, 17 images · IV contrast (APPLIED)
Comparison: None.

CLINICAL DATA: Pulmonary embolism, mid and low back pain, vomiting,
fever, hematuria

EXAM:
CT ANGIOGRAPHY CHEST
CT ABDOMEN AND PELVIS WITH CONTRAST
TECHNIQUE: Multidetector CT imaging of the chest was performed using the
standard protocol during bolus administration of intravenous
contrast. Multiplanar CT image reconstructions and MIPs were
obtained to evaluate the vascular anatomy. Multidetector CT imaging
of the abdomen and pelvis was performed using the standard protocol
during bolus administration of intravenous contrast.
CONTRAST:  100mL OMNIPAQUE IOHEXOL 350 MG/ML SOLN

[Series 2: axial st · axial · 0.98mm/px · z∈[-1020,-560]mm · 12 of 104 slices shown, 14 images]
[im 6/104  soft-tissue]
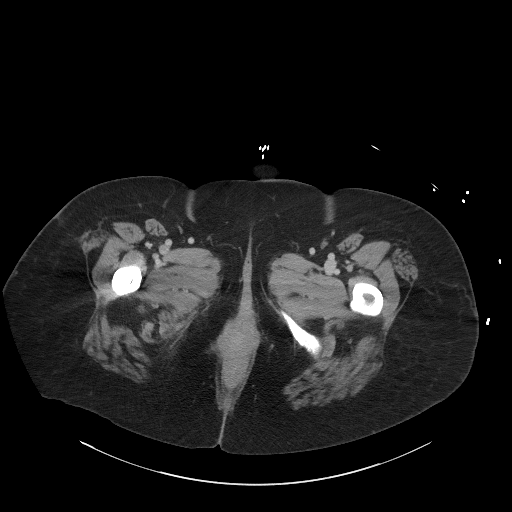
[im 6/104  bone]
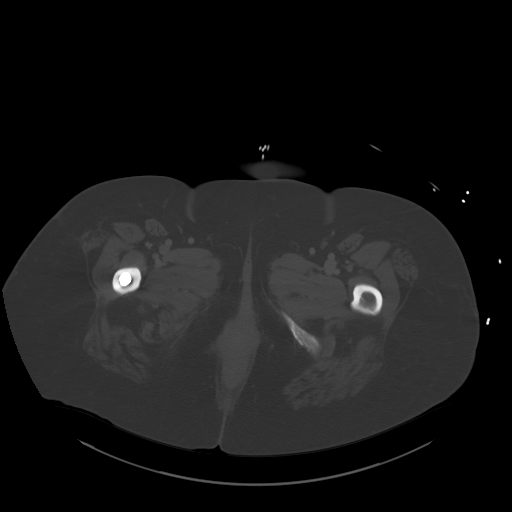
[im 17/104  soft-tissue]
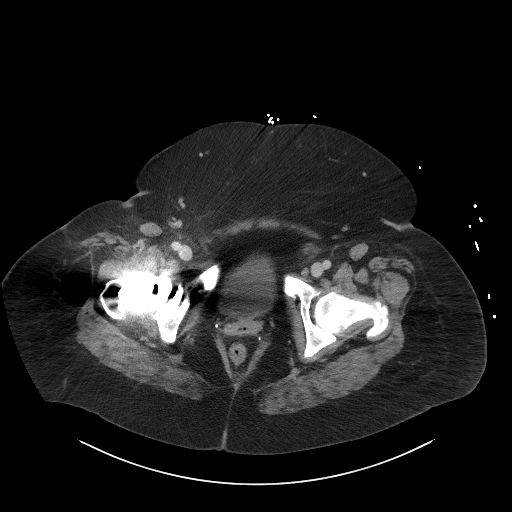
[im 22/104  soft-tissue]
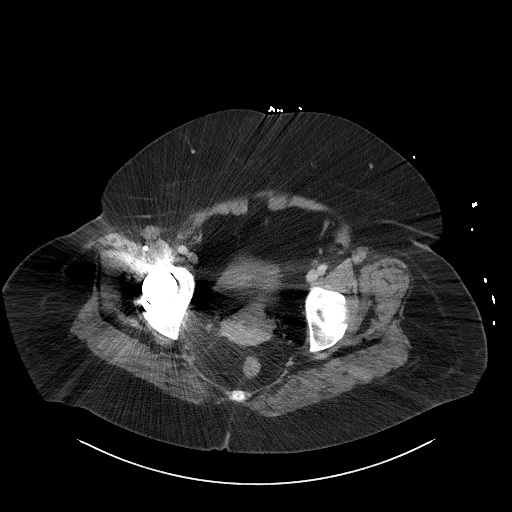
[im 33/104  soft-tissue]
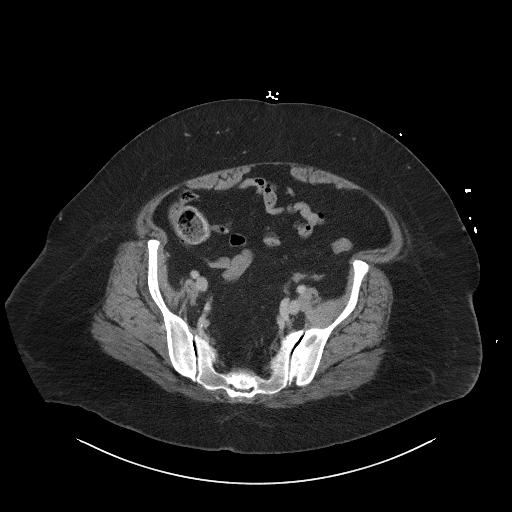
[im 38/104  soft-tissue]
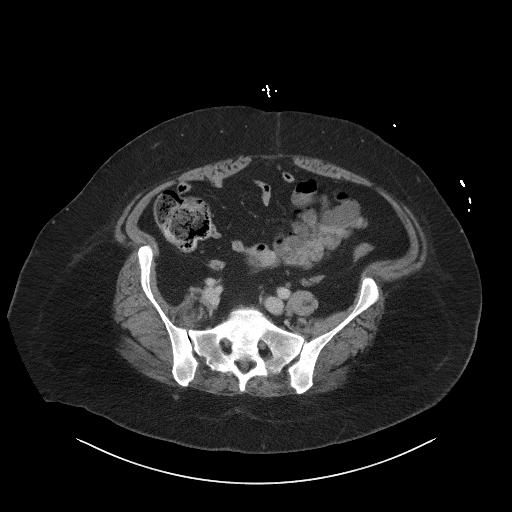
[im 49/104  soft-tissue]
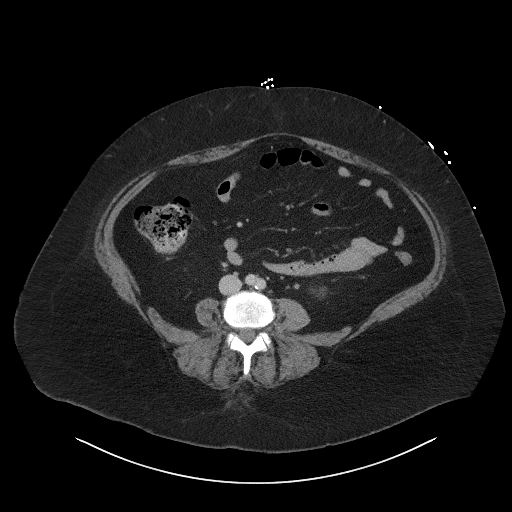
[im 55/104  soft-tissue]
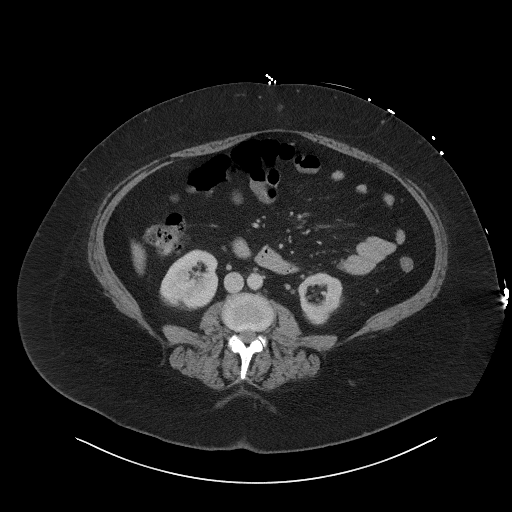
[im 66/104  soft-tissue]
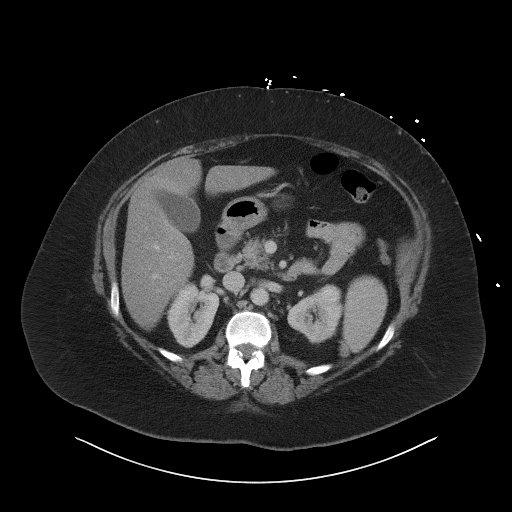
[im 71/104  soft-tissue]
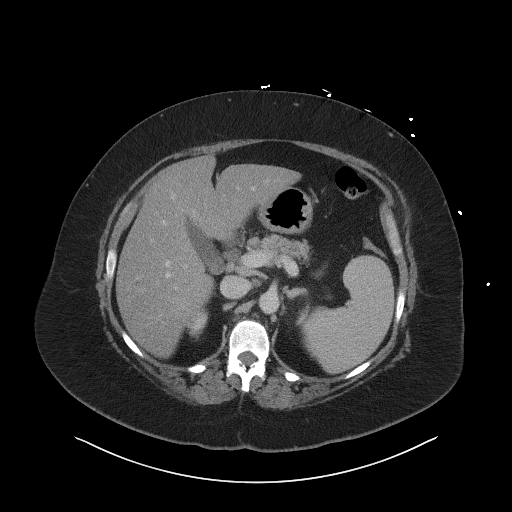
[im 71/104  bone]
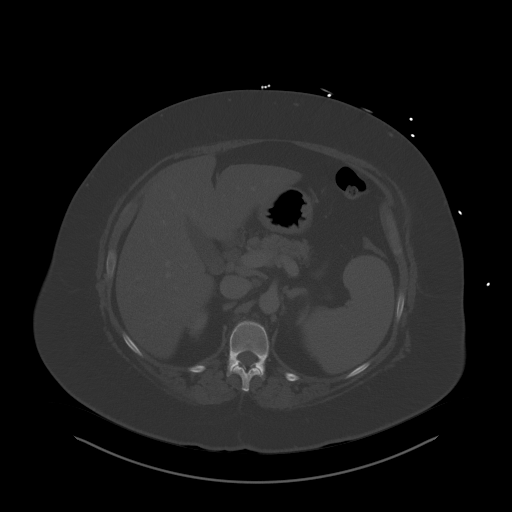
[im 82/104  soft-tissue]
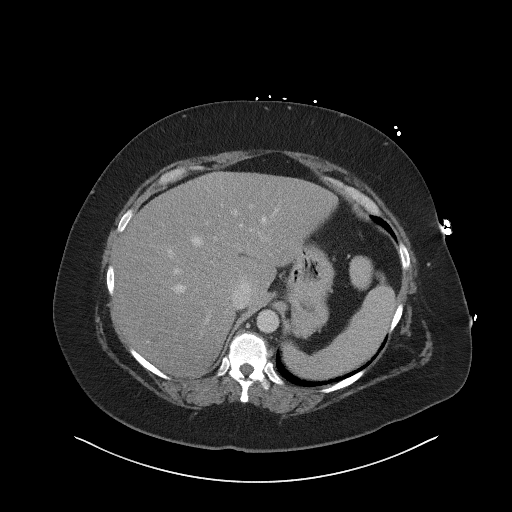
[im 87/104  soft-tissue]
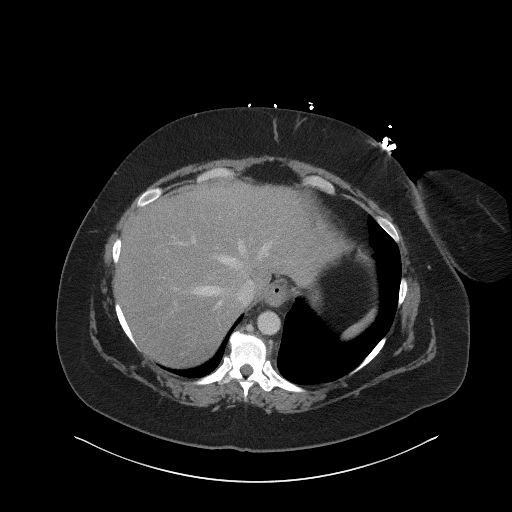
[im 98/104  soft-tissue]
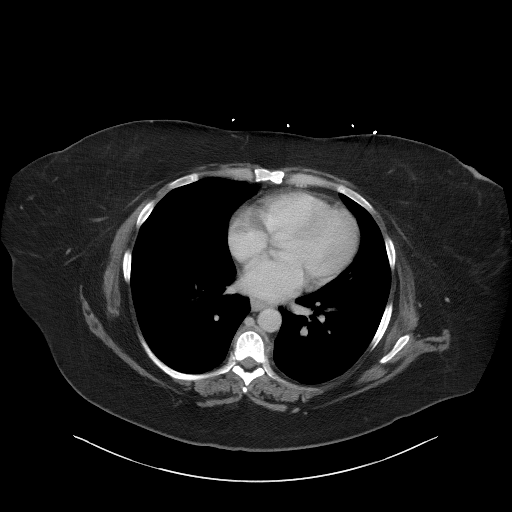

[Series 5: coronal st · coronal · 0.98mm/px · 3 of 114 slices shown]
[im 38/114  soft-tissue]
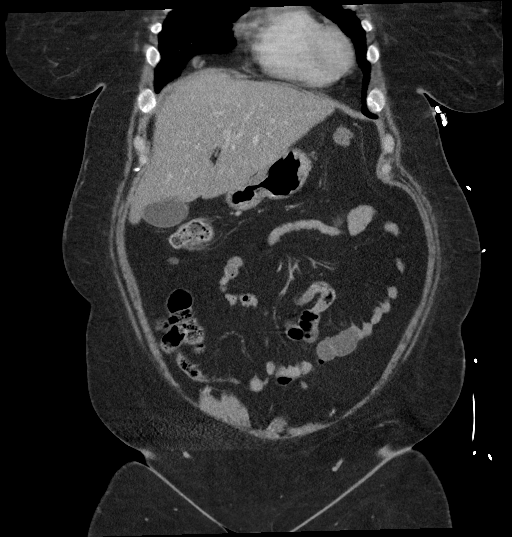
[im 51/114  soft-tissue]
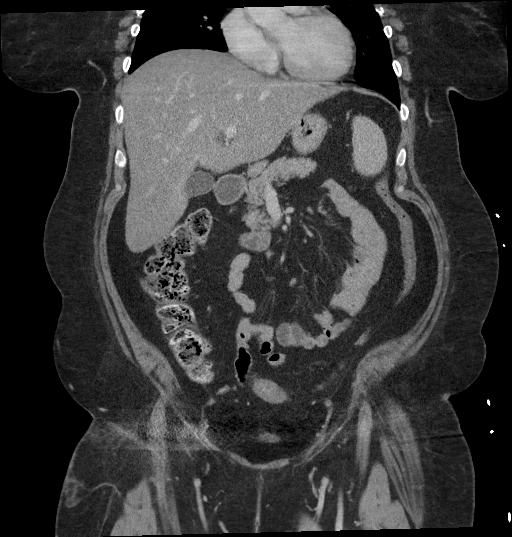
[im 63/114  soft-tissue]
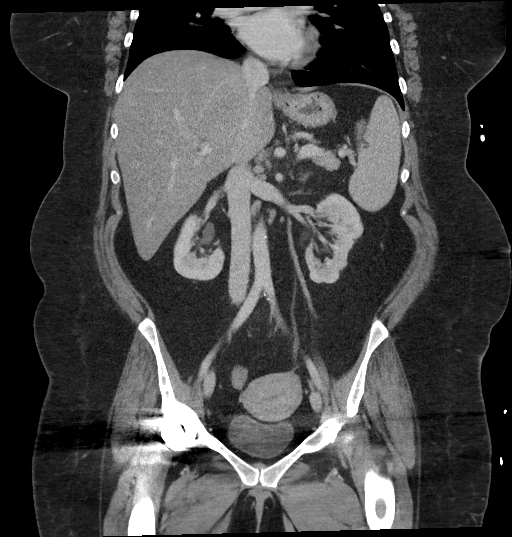

[15 of 46 positions shown; findings below may reference images not displayed]

FINDINGS: CTA CHEST FINDINGS

Cardiovascular: There is suboptimal opacification of the pulmonary
arterial tree with preferential opacification of the thoracic aorta.
The examination is adequate only for exclusion of intraluminal
filling defect within the main, right, left, and lobar pulmonary
arteries, of which there are none. The segmental and subsegmental
pulmonary arteries are not well assessed on this examination.
Central pulmonary arteries are of normal caliber.

Cardiac size within normal limits. No pericardial effusion. No
significant coronary artery calcification. The thoracic aorta is
unremarkable.

Mediastinum/Nodes: No enlarged mediastinal, hilar, or axillary lymph
nodes. Thyroid gland, trachea, and esophagus demonstrate no
significant findings.

Lungs/Pleura: 7 mm subpleural ground-glass pulmonary nodule within
the right middle lobe, axial image # 38/6 is indeterminate. The
lungs are otherwise clear. No pneumothorax or pleural effusion. No
central obstructing lesion.

Musculoskeletal: No acute bone abnormality. No lytic or blastic bone
lesion.

Review of the MIP images confirms the above findings.

CT ABDOMEN and PELVIS FINDINGS

Hepatobiliary: Moderate hepatic steatosis. No focal intrahepatic
mass. No intra or extrahepatic biliary ductal dilation. Gallbladder
unremarkable.

Pancreas: Unremarkable

Spleen: Unremarkable

Adrenals/Urinary Tract: The adrenal glands are unremarkable. The
kidneys are normal in position. There has developed asymmetric
bilateral renal cortical scarring and asymmetric atrophy, left
greater than right. No hydronephrosis. No intrarenal or ureteral
calculi. The bladder is partially obscured, however, the visualized
segment is unremarkable.

Stomach/Bowel: Stomach is within normal limits. Appendix appears
normal. No evidence of bowel wall thickening, distention, or
inflammatory changes. No free intraperitoneal gas or fluid

Vascular/Lymphatic: Mild aortoiliac atherosclerotic calcification.
No aortic aneurysm. No pathologic adenopathy within the abdomen and
pelvis.

Reproductive: Uterus and bilateral adnexa are unremarkable.

Other: Tiny fat containing umbilical hernia. The rectum is
unremarkable.

Musculoskeletal: Right total hip arthroplasty has been performed. No
acute bone abnormality. Degenerative changes are seen within the
lumbosacral junction.

Review of the MIP images confirms the above findings.
IMPRESSION: Technically limited examination with suboptimal opacification of the
pulmonary arterial tree. No evidence of pulmonary embolism through
the level of the lobar pulmonary arteries. The segmental and
subsegmental pulmonary arteries are not well assessed.

7 mm indeterminate right middle lobe ground-glass pulmonary nodule.
Initial follow-up with CT at 6-12 months is recommended to confirm
persistence. If persistent, repeat CT is recommended every 2 years
until 5 years of stability has been established. This recommendation
follows the consensus statement: Guidelines for Management of
Incidental Pulmonary Nodules Detected on CT Images: From the

Moderate hepatic steatosis.

Interval resolution of previously noted bilateral hydronephrosis and
perinephric stranding. Interval development of asymmetric bilateral
cortical scarring and atrophy, left greater than right.

Aortic Atherosclerosis ([V3]-[V3]).

## 2020-08-25 MED ORDER — LORAZEPAM 2 MG/ML IJ SOLN: 0.0000 mg | Freq: Two times a day (BID) | INTRAMUSCULAR | Status: DC

## 2020-08-25 MED ORDER — ACETAMINOPHEN 500 MG PO TABS
1000.0000 mg | ORAL_TABLET | Freq: Once | ORAL | Status: AC
  Administered 2020-08-25: 1000 mg via ORAL
  Filled 2020-08-25: qty 2

## 2020-08-25 MED ORDER — LORAZEPAM 0.5 MG PO TABS
0.5000 mg | ORAL_TABLET | Freq: Four times a day (QID) | ORAL | Status: DC | PRN
  Administered 2020-08-27 – 2020-08-28 (×5): 0.5 mg via ORAL
  Filled 2020-08-25 (×5): qty 1

## 2020-08-25 MED ORDER — THIAMINE HCL 100 MG PO TABS
100.0000 mg | ORAL_TABLET | Freq: Every day | ORAL | Status: DC
  Administered 2020-08-25 – 2020-08-29 (×5): 100 mg via ORAL
  Filled 2020-08-25 (×5): qty 1

## 2020-08-25 MED ORDER — FOLIC ACID 1 MG PO TABS
1.0000 mg | ORAL_TABLET | Freq: Every day | ORAL | Status: DC
  Administered 2020-08-25 – 2020-08-29 (×5): 1 mg via ORAL
  Filled 2020-08-25 (×5): qty 1

## 2020-08-25 MED ORDER — LORAZEPAM 2 MG/ML IJ SOLN: 0.0000 mg | Freq: Four times a day (QID) | INTRAMUSCULAR | Status: DC

## 2020-08-25 MED ORDER — ADULT MULTIVITAMIN W/MINERALS CH
1.0000 | ORAL_TABLET | Freq: Every day | ORAL | Status: DC
  Administered 2020-08-25 – 2020-08-29 (×5): 1 via ORAL
  Filled 2020-08-25 (×5): qty 1

## 2020-08-25 MED ORDER — HYDRALAZINE HCL 20 MG/ML IJ SOLN: 5.0000 mg | INTRAMUSCULAR | Status: DC | PRN

## 2020-08-25 MED ORDER — LORAZEPAM 1 MG PO TABS: 1.0000 mg | ORAL_TABLET | ORAL | Status: DC | PRN

## 2020-08-25 MED ORDER — KETOROLAC TROMETHAMINE 15 MG/ML IJ SOLN
15.0000 mg | Freq: Four times a day (QID) | INTRAMUSCULAR | Status: DC | PRN
  Administered 2020-08-25 – 2020-08-29 (×17): 15 mg via INTRAVENOUS
  Filled 2020-08-25 (×17): qty 1

## 2020-08-25 MED ORDER — SODIUM CHLORIDE 0.9 % IV SOLN
2.0000 g | Freq: Three times a day (TID) | INTRAVENOUS | Status: DC
  Administered 2020-08-25 – 2020-08-28 (×11): 2 g via INTRAVENOUS
  Filled 2020-08-25 (×13): qty 2

## 2020-08-25 MED ORDER — GADOBUTROL 1 MMOL/ML IV SOLN
10.0000 mL | Freq: Once | INTRAVENOUS | Status: AC | PRN
  Administered 2020-08-25: 10 mL via INTRAVENOUS

## 2020-08-25 MED ORDER — SODIUM CHLORIDE 0.9 % IV SOLN: INTRAVENOUS | Status: DC

## 2020-08-25 MED ORDER — HEPARIN SODIUM (PORCINE) 5000 UNIT/ML IJ SOLN
5000.0000 [IU] | Freq: Three times a day (TID) | INTRAMUSCULAR | Status: DC
  Administered 2020-08-25 – 2020-08-29 (×11): 5000 [IU] via SUBCUTANEOUS
  Filled 2020-08-25 (×9): qty 1

## 2020-08-25 MED ORDER — ONDANSETRON HCL 4 MG/2ML IJ SOLN: 4.0000 mg | Freq: Three times a day (TID) | INTRAMUSCULAR | Status: DC | PRN

## 2020-08-25 MED ORDER — THIAMINE HCL 100 MG/ML IJ SOLN
100.0000 mg | Freq: Every day | INTRAMUSCULAR | Status: DC
  Filled 2020-08-25 (×2): qty 2

## 2020-08-25 MED ORDER — LORAZEPAM 2 MG/ML IJ SOLN: 1.0000 mg | INTRAMUSCULAR | Status: DC | PRN

## 2020-08-25 MED ORDER — NICOTINE 21 MG/24HR TD PT24
21.0000 mg | MEDICATED_PATCH | Freq: Every day | TRANSDERMAL | Status: DC
  Administered 2020-08-25 – 2020-08-29 (×5): 21 mg via TRANSDERMAL
  Filled 2020-08-25 (×5): qty 1

## 2020-08-25 MED ORDER — GADOBENATE DIMEGLUMINE 529 MG/ML IV SOLN: 10.0000 mL | Freq: Once | INTRAVENOUS | Status: DC | PRN

## 2020-08-25 MED ORDER — ACETAMINOPHEN 325 MG PO TABS
650.0000 mg | ORAL_TABLET | Freq: Four times a day (QID) | ORAL | Status: DC | PRN
  Administered 2020-08-26 – 2020-08-28 (×8): 650 mg via ORAL
  Filled 2020-08-25 (×7): qty 2

## 2020-08-25 MED ORDER — VANCOMYCIN HCL 1500 MG/300ML IV SOLN
1500.0000 mg | INTRAVENOUS | Status: DC
  Administered 2020-08-25 – 2020-08-26 (×2): 1500 mg via INTRAVENOUS
  Filled 2020-08-25 (×3): qty 300

## 2020-08-25 MED ORDER — POTASSIUM CHLORIDE CRYS ER 20 MEQ PO TBCR
40.0000 meq | EXTENDED_RELEASE_TABLET | Freq: Once | ORAL | Status: AC
  Administered 2020-08-25: 40 meq via ORAL
  Filled 2020-08-25: qty 2

## 2020-08-25 NOTE — ED Notes (Signed)
Patient transported to MRI 

## 2020-08-25 NOTE — Sepsis Progress Note (Signed)
Followed and completed sepsis monitoring °

## 2020-08-25 NOTE — Consult Note (Addendum)
[facility]    Date of Admission:  08/24/2020     Reason for Consult: vertebral om, bacteremia, ivdu  Virtual consult note   Referring Provider: Clyde Lundborg   Lines:  Peripheral iv's  Abx: 7/29-c vanc 7/29-c cefepime        Assessment: 50 yo hep c, ivdu (last use 4 days iv fentanyl, prior to admission), admitted with sepsis and acute back pain with imaging suggestion vertebral om around t8-9 area and also high burden gram negative rod bacteremia  Pending IR sampling of the thoracic spine. 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 So will w/u that as well even for gram negative bacteremia  Hiv screen negative. Will send other HCM labs such as hepatitis and rpr and check hep c rna load  Plan: Await IR sampling thoracic spine Continue vanc/cefepime Please get tte Repeat hepatitis screen; hep c rna quant Check rpr Repeat bcx tomorrow   Dr Rivka Safer will be available Monday to continue seeing patient;  please notify her then      ------------------------------------------------ Principal Problem:   Osteomyelitis of thoracic spine (HCC) Active Problems:   Alcohol abuse   Tobacco use disorder   Sepsis (HCC)   History of asthma   Back pain   Polysubstance abuse (HCC)   IVDU (intravenous drug user)   HTN (hypertension)   Depression with anxiety   Nausea & vomiting   Chest pain   Hypokalemia   Hypomagnesemia   Lung nodule   UTI (urinary tract infection)    HPI: Brenda [name] is a 50 y.o. female ivdu, hep c, bipolar recent ivdu 4 days pta, admitted with fever and back pain   Febrile on admission with wbc 11 Imaging mri showed t8-9 vertebral om  Bcx 4 of 4 bottles gnr Started on vanc/cefepime Hiv screen negative  Pending IR consult for  sampling of t8-9 area     Family History  Problem Relation Age of Onset   Ovarian cancer Neg Hx    Colon cancer Neg Hx    Breast cancer Neg Hx     Social History   Tobacco Use   Smoking status: Every Day    Packs/day: 1.00    Years: 31.00    Pack years: 31.00    Types: Cigarettes   Smokeless tobacco: Never   Tobacco comments:    since age 81  Vaping Use   Vaping Use: Never used  Substance Use Topics   Alcohol use: Yes    Comment: rare   Drug use: Not Currently    Frequency: 7.0 times per week    Types: Cocaine, Heroin    Comment: uses heroin for pain 09/03/18. Last cocaine 08/02/18    Allergies  Allergen Reactions   Flexeril [Cyclobenzaprine] Other (See Comments)    RLS-like symptoms    Seroquel [Quetiapine Fumarate] Other (See Comments)    RLS-like symptoms    Review of Systems: ROS All Other ROS was negative, except mentioned above   Past Medical History:  Diagnosis Date   Ankle fracture, left    in past.   Anxiety    Arthritis    Right hip, scheduled for replacement 7/13/24m, left ankle   Asthma    Bipolar disorder (HCC)    Chronic hepatitis  C (HCC)    Depression    Dyspnea    occasional with exertion   GERD (gastroesophageal reflux disease)    pmh   Headache    Hearing loss    some loss in both ears - no dx - per patient "runs in family", no hearing aids   Hypertension    Lump in female breast    right   Pneumonia    x 1   Polysubstance abuse (HCC)    Primary localized osteoarthritis of hip    right   SVD (spontaneous vaginal delivery)    x 3   Uses roller walker    occasional   Wears dentures    full upper       Scheduled Meds:  folic acid  1 mg Oral Daily   heparin  5,000 Units Subcutaneous Q8H   LORazepam  0-4 mg Intravenous Q6H   Followed by   Melene Muller ON 08/27/2020] LORazepam  0-4 mg Intravenous Q12H   multivitamin with minerals  1 tablet Oral Daily   nicotine  21 mg Transdermal Daily   thiamine  100 mg Oral Daily   Or    thiamine  100 mg Intravenous Daily   Continuous Infusions:  sodium chloride 100 mL/hr at 08/25/20 0849   ceFEPime (MAXIPIME) IV 2 g (08/25/20 0957)   vancomycin     PRN Meds:.acetaminophen, hydrALAZINE, ketorolac, LORazepam **OR** LORazepam, ondansetron (ZOFRAN) IV   OBJECTIVE: Blood pressure (!) 149/84, pulse 85, temperature (!) 97.5 F (36.4 C), temperature source Oral, resp. rate 20, height 5\' 2"  (1.575 m), weight 111.9 kg, SpO2 98 %.  Physical Exam Virtual consult note.    Lab Results Lab Results  Component Value Date   WBC 11.1 (H) 08/24/2020   HGB 12.3 08/24/2020   HCT 39.2 08/24/2020   MCV 73.1 (L) 08/24/2020   PLT 179 08/24/2020    Lab Results  Component Value Date   CREATININE 0.93 08/24/2020   BUN 7 08/24/2020   NA 134 (L) 08/24/2020   K 3.0 (L) 08/24/2020   CL 96 (L) 08/24/2020   CO2 26 08/24/2020    Lab Results  Component Value Date   ALT 58 (H) 08/24/2020   AST 36 08/24/2020   ALKPHOS 93 08/24/2020   BILITOT 1.0 08/24/2020      Microbiology: Recent Results (from the past 240 hour(s))  Culture, blood (Routine x 2)     Status: None (Preliminary result)   Collection Time: 08/24/20 10:27 PM   Specimen: BLOOD  Result Value Ref Range Status   Specimen Description BLOOD LEFT ANTECUBITAL  Final   Special Requests   Final    BOTTLES DRAWN AEROBIC AND ANAEROBIC Blood Culture adequate volume   Culture  Setup Time   Final    GRAM NEGATIVE RODS IN BOTH AEROBIC AND ANAEROBIC BOTTLES CRITICAL VALUE NOTED.  VALUE IS CONSISTENT WITH PREVIOUSLY REPORTED AND CALLED VALUE. Performed at [facility], [address] [place], [place]    Culture GRAM NEGATIVE RODS  Final   Report Status PENDING  Incomplete  Culture, blood (Routine x 2)     Status: None (Preliminary result)   Collection Time: 08/24/20 10:27 PM   Specimen: BLOOD  Result Value Ref Range Status   Specimen Description BLOOD RIGHT ANTECUBITAL  Final   Special Requests   Final     BOTTLES DRAWN AEROBIC AND ANAEROBIC Blood Culture adequate volume   Culture  Setup Time   Final    GRAM  NEGATIVE RODS IN BOTH AEROBIC AND ANAEROBIC BOTTLES Organism ID to follow CRITICAL RESULT CALLED TO, READ BACK BY AND VERIFIED WITH: Algie CofferSUSAN WATSON, PHARMD AT 1119 ON 08/25/20 BY GM Performed at [facility], [address] [place], [place] [address]    Culture GRAM NEGATIVE RODS  Final   Report Status PENDING  Incomplete  Resp Panel by RT-PCR (Flu A&B, Covid) Nasopharyngeal Swab     Status: None   Collection Time: 08/24/20 10:27 PM   Specimen: Nasopharyngeal Swab; Nasopharyngeal(NP) swabs in vial transport medium  Result Value Ref Range Status   SARS Coronavirus 2 by RT PCR NEGATIVE NEGATIVE Final    Comment: (NOTE) SARS-CoV-2 target nucleic acids are NOT DETECTED.  The SARS-CoV-2 RNA is generally detectable in upper respiratory specimens during the acute phase of infection. The lowest concentration of SARS-CoV-2 viral copies this assay can detect is 138 copies/mL. A negative result does not preclude SARS-Cov-2 infection and should not be used as the sole basis for treatment or other patient management decisions. A negative result may occur with  improper specimen collection/handling, submission of specimen other than nasopharyngeal swab, presence of viral mutation(s) within the areas targeted by this assay, and inadequate number of viral copies(<138 copies/mL). A negative result must be combined with clinical observations, patient history, and epidemiological information. The expected result is Negative.  Fact Sheet for Patients:  BloggerCourse.comhttps://www.fda.gov/media/152166/download  Fact Sheet for Healthcare Providers:  SeriousBroker.ithttps://www.fda.gov/media/152162/download  This test is no t yet approved or cleared by the [place] FDA and  has been authorized for detection and/or diagnosis of SARS-CoV-2 by FDA under an Emergency Use Authorization (EUA). This EUA will remain  in  effect (meaning this test can be used) for the duration of the COVID-19 declaration under Section 564(b)(1) of the Act, 21 U.S.C.section 360bbb-3(b)(1), unless the authorization is terminated  or revoked sooner.       Influenza A by PCR NEGATIVE NEGATIVE Final   Influenza B by PCR NEGATIVE NEGATIVE Final    Comment: (NOTE) The Xpert Xpress SARS-CoV-2/FLU/RSV plus assay is intended as an aid in the diagnosis of influenza from Nasopharyngeal swab specimens and should not be used as a sole basis for treatment. Nasal washings and aspirates are unacceptable for Xpert Xpress SARS-CoV-2/FLU/RSV testing.  Fact Sheet for Patients: BloggerCourse.comhttps://www.fda.gov/media/152166/download  Fact Sheet for Healthcare Providers: SeriousBroker.ithttps://www.fda.gov/media/152162/download  This test is not yet approved or cleared by the [place] FDA and has been authorized for detection and/or diagnosis of SARS-CoV-2 by FDA under an Emergency Use Authorization (EUA). This EUA will remain in effect (meaning this test can be used) for the duration of the COVID-19 declaration under Section 564(b)(1) of the Act, 21 U.S.C. section 360bbb-3(b)(1), unless the authorization is terminated or revoked.  Performed at [facility], [address] [place], [place] [address]   Blood Culture ID Panel (Reflexed)     Status: Abnormal   Collection Time: 08/24/20 10:27 PM  Result Value Ref Range Status   Enterococcus faecalis NOT DETECTED NOT DETECTED Final   Enterococcus Faecium NOT DETECTED NOT DETECTED Final   Listeria monocytogenes NOT DETECTED NOT DETECTED Final   Staphylococcus species NOT DETECTED NOT DETECTED Final   Staphylococcus aureus (BCID) NOT DETECTED NOT DETECTED Final   Staphylococcus epidermidis NOT DETECTED NOT DETECTED Final   Staphylococcus lugdunensis NOT DETECTED NOT DETECTED Final   Streptococcus species NOT DETECTED NOT DETECTED Final   Streptococcus agalactiae NOT DETECTED NOT DETECTED Final    Streptococcus pneumoniae NOT DETECTED NOT DETECTED Final  Streptococcus pyogenes NOT DETECTED NOT DETECTED Final   A.calcoaceticus-baumannii NOT DETECTED NOT DETECTED Final   Bacteroides fragilis NOT DETECTED NOT DETECTED Final   Enterobacterales DETECTED (A) NOT DETECTED Final    Comment: Enterobacterales represent a large order of gram negative bacteria, not a single organism. Refer to culture for further identification. CRITICAL RESULT CALLED TO, READ BACK BY AND VERIFIED WITH: SUSAN WATSON, PHARMD AT 1119 ON 08/25/20 BY GM    Enterobacter cloacae complex NOT DETECTED NOT DETECTED Final   Escherichia coli NOT DETECTED NOT DETECTED Final   Klebsiella aerogenes NOT DETECTED NOT DETECTED Final   Klebsiella oxytoca NOT DETECTED NOT DETECTED Final   Klebsiella pneumoniae NOT DETECTED NOT DETECTED Final   Proteus species NOT DETECTED NOT DETECTED Final   Salmonella species NOT DETECTED NOT DETECTED Final   Serratia marcescens NOT DETECTED NOT DETECTED Final   Haemophilus influenzae NOT DETECTED NOT DETECTED Final   Neisseria meningitidis NOT DETECTED NOT DETECTED Final   Pseudomonas aeruginosa NOT DETECTED NOT DETECTED Final   Stenotrophomonas maltophilia NOT DETECTED NOT DETECTED Final   Candida albicans NOT DETECTED NOT DETECTED Final   Candida auris NOT DETECTED NOT DETECTED Final   Candida glabrata NOT DETECTED NOT DETECTED Final   Candida krusei NOT DETECTED NOT DETECTED Final   Candida parapsilosis NOT DETECTED NOT DETECTED Final   Candida tropicalis NOT DETECTED NOT DETECTED Final   Cryptococcus neoformans/gattii NOT DETECTED NOT DETECTED Final   CTX-M ESBL NOT DETECTED NOT DETECTED Final   Carbapenem resistance IMP NOT DETECTED NOT DETECTED Final   Carbapenem resistance KPC NOT DETECTED NOT DETECTED Final   Carbapenem resistance NDM NOT DETECTED NOT DETECTED Final   Carbapenem resist OXA 48 LIKE NOT DETECTED NOT DETECTED Final   Carbapenem resistance VIM NOT DETECTED NOT  DETECTED Final    Comment: Performed at [facility], [address], [place], [place] [address]     Serology:    Imaging: If present, new imagings (plain films, ct scans, and mri) have been personally visualized and interpreted; radiology reports have been reviewed. Decision making incorporated into the Impression / Recommendations.  7/30 spine mri 1. Question subtle paraspinous stranding and edema adjacent to the T8-9 interspace as above. Given the history of fever and mid back pain, changes are concerning for possible early/developing paraspinous infection. Question early changes of osteomyelitis about the adjacent T8-9 interspace. No discrete or drainable fluid collections. 2. No other evidence for acute infection elsewhere within the cervical, thoracic, or lumbar spine. No epidural abscess. 3. 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 4. Small left paracentral disc protrusion at T8-9 with mild flattening of the left hemi cord. No significant spinal stenosis. 5. Moderate right worse than left facet hypertrophy at L5-S1 with associated prominent joint effusion on the right. No significant surrounding inflammatory changes to suggest septic arthritis. Finding could contribute to lower back pain.      Raymondo Band, MD [facility] [phone] pager    08/25/2020, 12:07 PM

## 2020-08-25 NOTE — Consult Note (Signed)
Pharmacy Antibiotic Note  Brenda [name] is a 50 y.o. female with medical history including IVDU, polysubstance abuse admitted on 08/24/2020 with  back pain, paraspinous infection, possible discitis, gram negative bacteremia .  Pharmacy has been consulted for vancomycin and cefepime dosing.  Plan:  Cefepime 2 g IV q8h  Vancomycin 2.5 g IV LD followed by maintenance regimen of vancomycin 1.5 g IV q24h --Calculated AUC: 506, Cmin 11.3 --Daily Scr per protocol --Levels at steady state as clinically indicated  Height: 5\' 2"  (157.5 cm) Weight: 111.9 kg (246 lb 11.1 oz) IBW/kg (Calculated) : 50.1  Temp (24hrs), Avg:99.5 F (37.5 C), Min:97.5 F (36.4 C), Max:101.3 F (38.5 C)  Recent Labs  Lab 08/24/20 2227 08/25/20 0319  WBC 11.1*  --   CREATININE 0.93  --   LATICACIDVEN 2.3* 0.8    Estimated Creatinine Clearance: 87.4 mL/min (by C-G formula based on SCr of 0.93 mg/dL).    Allergies  Allergen Reactions   Flexeril [Cyclobenzaprine] Other (See Comments)    RLS-like symptoms    Seroquel [Quetiapine Fumarate] Other (See Comments)    RLS-like symptoms    Antimicrobials this admission: Cefepime 7/29 >>  Vancomycin 7/29 >>   Dose adjustments this admission: N/A  Microbiology results: 7/29 BCx: 4/4 bottles GNR. BCID detected Enterobacterales but no specific species 7/29 UCx: pending   Thank you for allowing pharmacy to be a part of this patient's care.  8/29 08/25/2020 1:51 PM

## 2020-08-25 NOTE — Progress Notes (Signed)
PHARMACY - PHYSICIAN COMMUNICATION CRITICAL VALUE ALERT - BLOOD CULTURE IDENTIFICATION (BCID)  Brenda [name] is an 50 y.o. female who presented to [facility] on 08/24/2020 with a chief complaint of back pain.  Assessment: Blood cx 4 out of 4 growing GNR. BCID positive for Enterobacterales. No subspecies detected, and carbapenem resistance not detected. WBC elevated at 11.1 Tmax in the past 24 hours was 101.79F. Patient on vancomycin and cefepime for osteomyelitis.   Name of physician (or Provider) Contacted: Dr. Clyde Lundborg  Current antibiotics: cefepime and vancomycin  Changes to prescribed antibiotics recommended: Recommended to continue cefepime and discontinue vancomycin and consult ID.  Recommendations declined by provider due to IVDU. Plan is to continue cefepime and vancomycin and MD will consult ID.  Results for orders placed or performed during the hospital encounter of 08/24/20  Blood Culture ID Panel (Reflexed) (Collected: 08/24/2020 10:27 PM)  Result Value Ref Range   Enterococcus faecalis NOT DETECTED NOT DETECTED   Enterococcus Faecium NOT DETECTED NOT DETECTED   Listeria monocytogenes NOT DETECTED NOT DETECTED   Staphylococcus species NOT DETECTED NOT DETECTED   Staphylococcus aureus (BCID) NOT DETECTED NOT DETECTED   Staphylococcus epidermidis NOT DETECTED NOT DETECTED   Staphylococcus lugdunensis NOT DETECTED NOT DETECTED   Streptococcus species NOT DETECTED NOT DETECTED   Streptococcus agalactiae NOT DETECTED NOT DETECTED   Streptococcus pneumoniae NOT DETECTED NOT DETECTED   Streptococcus pyogenes NOT DETECTED NOT DETECTED   A.calcoaceticus-baumannii NOT DETECTED NOT DETECTED   Bacteroides fragilis NOT DETECTED NOT DETECTED   Enterobacterales DETECTED (A) NOT DETECTED   Enterobacter cloacae complex NOT DETECTED NOT DETECTED   Escherichia coli NOT DETECTED NOT DETECTED   Klebsiella aerogenes NOT DETECTED NOT DETECTED   Klebsiella oxytoca NOT DETECTED NOT DETECTED    Klebsiella pneumoniae NOT DETECTED NOT DETECTED   Proteus species NOT DETECTED NOT DETECTED   Salmonella species NOT DETECTED NOT DETECTED   Serratia marcescens NOT DETECTED NOT DETECTED   Haemophilus influenzae NOT DETECTED NOT DETECTED   Neisseria meningitidis NOT DETECTED NOT DETECTED   Pseudomonas aeruginosa NOT DETECTED NOT DETECTED   Stenotrophomonas maltophilia NOT DETECTED NOT DETECTED   Candida albicans NOT DETECTED NOT DETECTED   Candida auris NOT DETECTED NOT DETECTED   Candida glabrata NOT DETECTED NOT DETECTED   Candida krusei NOT DETECTED NOT DETECTED   Candida parapsilosis NOT DETECTED NOT DETECTED   Candida tropicalis NOT DETECTED NOT DETECTED   Cryptococcus neoformans/gattii NOT DETECTED NOT DETECTED   CTX-M ESBL NOT DETECTED NOT DETECTED   Carbapenem resistance IMP NOT DETECTED NOT DETECTED   Carbapenem resistance KPC NOT DETECTED NOT DETECTED   Carbapenem resistance NDM NOT DETECTED NOT DETECTED   Carbapenem resist OXA 48 LIKE NOT DETECTED NOT DETECTED   Carbapenem resistance VIM NOT DETECTED NOT DETECTED    Jaynie Bream, PharmD Pharmacy Resident  08/25/2020 11:52 AM

## 2020-08-25 NOTE — Consult Note (Signed)
After review of paraspinal aspiration procedure request by IR Attending Dr. Miles Costain there is no fluid in the area in question. Due to the low probability of success coupled with the patients' positive blood culture Patient is not a candidate for this procedure. This was communicated directly to the Team by the IR Attending.

## 2020-08-25 NOTE — H&P (Signed)
History and Physical    Brenda [name] LJQ:[id number] DOB: 1970/04/26 DOA: 08/24/2020  Referring MD/NP/PA:   PCP: Langston Reusing, NP   Patient coming from:  The patient is coming from home.  At baseline, pt is independent for most of ADL.        Chief Complaint: Back pain, nausea, vomiting, dysuria  HPI: Brenda [name] is a 50 y.o. female with medical history significant of IVDU, hypertension, asthma, GERD, depression with anxiety, bipolar disorder, polysubstance abuse, (cocaine, heroin, alcohol, tobacco) HCV, who presents with back pain, nausea, vomiting, dysuria.  Patient states that she has back pain for more than 4 days, which is located in lower and mid back.  The pain is constant, sharp, severe, radiating to the left hip and left posterior leg, associated with fever and chills.  Patient states that she has intermittent chronic tingling in all extremities which has not changed.  No leg weakness.  Patient also has mild cough, mild shortness of breath and mild chest pain.  She states that her chest pain has resolved.  Patient has nausea and intermittent nonbilious nonbloody vomiting for about 1 week.  No diarrhea or abdominal pain.  Patient states that she has urinary incontinence which is a chronic issue.  She also has dysuria and burning on urination.  She has had small area of redness in the base of her right thumb.   ED Course: pt was found to have bacteremia (4/4 bottles GNR. BCID detected Enterobacterales but no specific species), WBC 11.1, lactic acid is 0.8, INR 1.1, PTT 31, troponin level 7, negative UDS, negative pregnancy test, positive urinalysis (he is in appearance, trace amount of leukocyte, positive nitrite, many bacteria, WBC 21-50), negative COVID PCR, ESR 59, potassium 3.0, magnesium 1.2, temperature 101.3, blood pressure 149/84, heart rate 144, RR 24, oxygen saturation 92-98% on room air.  Patient is admitted to [facility] as inpatient.  MRI of C-, T- and  L-spin: 1. Question subtle paraspinous stranding and edema adjacent to the T8-9 interspace as above. Given the history of fever and mid back pain, changes are concerning for possible early/developing paraspinous infection. Question early changes of osteomyelitis about the adjacent T8-9 interspace. No discrete or drainable fluid collections. 2. No other evidence for acute infection elsewhere within the cervical, thoracic, or lumbar spine. No epidural abscess. 3. 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 4. Small left paracentral disc protrusion at T8-9 with mild flattening of the left hemi cord. No significant spinal stenosis. 5. Moderate right worse than left facet hypertrophy at L5-S1 with associated prominent joint effusion on the right. No significant surrounding inflammatory changes to suggest septic arthritis. Finding could contribute to lower back pain  CTA of chest and CT-abd/pelvis Technically limited examination with suboptimal opacification of the pulmonary arterial tree. No evidence of pulmonary embolism through the level of the lobar pulmonary arteries. The segmental and subsegmental pulmonary arteries are not well assessed.   7 mm indeterminate right middle lobe ground-glass pulmonary nodule. Initial follow-up with CT at 6-12 months is recommended to confirm persistence.    Moderate hepatic steatosis.   Interval resolution of previously noted bilateral hydronephrosis and perinephric stranding. Interval development of asymmetric bilateral cortical scarring and atrophy, left greater than right.   Aortic Atherosclerosis (ICD10-I70.0).   Review of Systems:   General: no fevers, chills, no body weight gain, has fatigue HEENT: no blurry vision, hearing changes or sore throat Respiratory: Has  dyspnea, coughing, no wheezing CV: Had chest pain, no palpitations GI: Has nausea, vomiting, no  abdominal pain, diarrhea, constipation GU: has dysuria, burning on urination, urinary incontinence  Ext: no leg edema Neuro: no unilateral weakness, numbness, or tingling, no vision change or hearing loss Skin: no rash, no skin tear. Has a small area of redness in the base of right thumb MSK: Has a back pain Heme: No easy bruising.  Travel history: No recent long distant travel.  Allergy:  Allergies  Allergen Reactions   Flexeril [Cyclobenzaprine] Other (See Comments)    RLS-like symptoms    Seroquel [Quetiapine Fumarate] Other (See Comments)    RLS-like symptoms    Past Medical History:  Diagnosis Date   Ankle fracture, left    in past.   Anxiety    Arthritis    Right hip, scheduled for replacement 7/13/6m, left ankle   Asthma    Bipolar disorder (HCC)    Chronic hepatitis C (HCC)    Depression    Dyspnea    occasional with exertion   GERD (gastroesophageal reflux disease)    pmh   Headache    Hearing loss    some loss in both ears - no dx - per patient "runs in family", no hearing aids   Hypertension    Lump in female breast    right   Pneumonia    x 1   Polysubstance abuse ([place])    Primary localized osteoarthritis of hip    right   SVD (spontaneous vaginal delivery)    x 3   Uses roller walker    occasional   Wears dentures    full upper    Past Surgical History:  Procedure Laterality Date   TOTAL HIP ARTHROPLASTY Right 09/13/2018   TOTAL HIP ARTHROPLASTY Right 09/13/2018   Procedure: RIGHT TOTAL HIP ARTHROPLASTY ANTERIOR APPROACH;  Surgeon: Leandrew Koyanagi, MD;  Location: [facility];  Service: Orthopedics;  Laterality: Right;   TUBAL LIGATION      Social History:  reports that she has been smoking cigarettes. She has a 31.00 pack-year smoking history. She has never used smokeless tobacco. She reports current alcohol use. She reports previous drug use. Frequency: 7.00 times per week. Drugs: Cocaine and Heroin.  Family History:  Family History  Problem  Relation Age of Onset   Ovarian cancer Neg Hx    Colon cancer Neg Hx    Breast cancer Neg Hx      Prior to Admission medications   Medication Sig Start Date End Date Taking? Authorizing Provider  albuterol (VENTOLIN HFA) 108 (90 Base) MCG/ACT inhaler Inhale 2 puffs into the lungs every 6 (six) hours as needed for wheezing or shortness of breath. Patient not taking: Reported on 08/24/2020 01/24/19   Iloabachie, Chioma E, NP  aspirin 81 MG chewable tablet Chew 81 mg by mouth daily. Patient not taking: Reported on 08/24/2020    [provider]  diltiazem (CARDIZEM CD) 120 MG 24 hr capsule Take 1 capsule (120 mg total) by mouth daily. Patient not taking: Reported on 08/24/2020 12/20/18   Iloabachie, Chioma E, NP  Fluticasone-Salmeterol (ADVAIR) 100-50 MCG/DOSE AEPB Inhale 1 puff into the lungs 2 (two) times daily. Patient not taking: No sig reported 09/22/18   Iloabachie, Chioma E, NP  guaifenesin (ROBITUSSIN) 100 MG/5ML syrup Take 10 mLs (200 mg total) by mouth 3 (three) times daily as needed for cough. Patient not taking: Reported on 08/24/2020 01/24/19   Langston Reusing, NP  Physical Exam: Vitals:   08/25/20 0615 08/25/20 0622 08/25/20 0645 08/25/20 0651  BP:   (!) 149/84   Pulse: 86  85   Resp:   20   Temp:  98.9 F (37.2 C) (!) 97.5 F (36.4 C)   TempSrc:  Oral Oral   SpO2: 97%  98%   Weight:    111.9 kg  Height:    _0  (1.575 m)   General: Not in acute distress HEENT:       Eyes: PERRL, EOMI, no scleral icterus.       ENT: No discharge from the ears and nose, no pharynx injection, no tonsillar enlargement.        Neck: No JVD, no bruit, no mass felt. Heme: No neck lymph node enlargement. Cardiac: S1/S2, RRR, No murmurs, No gallops or rubs. Respiratory: No rales, wheezing, rhonchi or rubs. GI: Soft, nondistended, nontender, no rebound pain, no organomegaly, BS present. GU: No hematuria Ext: No pitting leg edema bilaterally. 1+DP/PT pulse  bilaterally. Musculoskeletal: Has tenderness in the midline of lower and middle back  skin: No rashes.  Has a small area of redness in the base of right thumb Neuro: Alert, oriented X3, cranial nerves II-XII grossly intact, moves all extremities normally.  Psych: Patient is not psychotic, no suicidal or hemocidal ideation.  Labs on Admission: I have personally reviewed following labs and imaging studies  CBC: Recent Labs  Lab 08/24/20 2227  WBC 11.1*  NEUTROABS 9.6*  HGB 12.3  HCT 39.2  MCV 73.1*  PLT 532   Basic Metabolic Panel: Recent Labs  Lab 08/24/20 2227 08/25/20 0319  NA 134*  --   K 3.0*  --   CL 96*  --   CO2 26  --   GLUCOSE 156*  --   BUN 7  --   CREATININE 0.93  --   CALCIUM 9.3  --   MG 1.2*  --   PHOS  --  2.6   GFR: Estimated Creatinine Clearance: 87.4 mL/min (by C-G formula based on SCr of 0.93 mg/dL). Liver Function Tests: Recent Labs  Lab 08/24/20 2227  AST 36  ALT 58*  ALKPHOS 93  BILITOT 1.0  PROT 7.5  ALBUMIN 3.0*   No results for input(s): LIPASE, AMYLASE in the last 168 hours. No results for input(s): AMMONIA in the last 168 hours. Coagulation Profile: Recent Labs  Lab 08/24/20 2227  INR 1.1   Cardiac Enzymes: No results for input(s): CKTOTAL, CKMB, CKMBINDEX, TROPONINI in the last 168 hours. BNP (last 3 results) No results for input(s): PROBNP in the last 8760 hours. HbA1C: No results for input(s): HGBA1C in the last 72 hours. CBG: No results for input(s): GLUCAP in the last 168 hours. Lipid Profile: No results for input(s): CHOL, HDL, LDLCALC, TRIG, CHOLHDL, LDLDIRECT in the last 72 hours. Thyroid Function Tests: No results for input(s): TSH, T4TOTAL, FREET4, T3FREE, THYROIDAB in the last 72 hours. Anemia Panel: No results for input(s): VITAMINB12, FOLATE, FERRITIN, TIBC, IRON, RETICCTPCT in the last 72 hours. Urine analysis:    Component Value Date/Time   COLORURINE YELLOW (A) 08/24/2020 2227   APPEARANCEUR HAZY (A)  08/24/2020 2227   APPEARANCEUR Clear 12/22/2018 1203   LABSPEC 1.010 08/24/2020 2227   LABSPEC 1.019 05/11/2014 1424   PHURINE 7.0 08/24/2020 2227   GLUCOSEU NEGATIVE 08/24/2020 2227   GLUCOSEU Negative 05/11/2014 1424   HGBUR MODERATE (A) 08/24/2020 2227   BILIRUBINUR NEGATIVE 08/24/2020 2227   BILIRUBINUR Negative 12/22/2018 1203  BILIRUBINUR 2+ 05/11/2014 1424   [place] 08/24/2020 2227   PROTEINUR NEGATIVE 08/24/2020 2227   UROBILINOGEN 0.2 09/29/2018 1514   NITRITE POSITIVE (A) 08/24/2020 2227   LEUKOCYTESUR TRACE (A) 08/24/2020 2227   LEUKOCYTESUR Trace 05/11/2014 1424   Sepsis Labs: _0 (procalcitonin:4,lacticidven:4) ) Recent Results (from the past 240 hour(s))  Culture, blood (Routine x 2)     Status: None (Preliminary result)   Collection Time: 08/24/20 10:27 PM   Specimen: BLOOD  Result Value Ref Range Status   Specimen Description BLOOD LEFT ANTECUBITAL  Final   Special Requests   Final    BOTTLES DRAWN AEROBIC AND ANAEROBIC Blood Culture adequate volume   Culture  Setup Time   Final    GRAM NEGATIVE RODS IN BOTH AEROBIC AND ANAEROBIC BOTTLES CRITICAL VALUE NOTED.  VALUE IS CONSISTENT WITH PREVIOUSLY REPORTED AND CALLED VALUE. Performed at [facility], [address] [place], [place] [address]    Culture GRAM NEGATIVE RODS  Final   Report Status PENDING  Incomplete  Culture, blood (Routine x 2)     Status: None (Preliminary result)   Collection Time: 08/24/20 10:27 PM   Specimen: BLOOD  Result Value Ref Range Status   Specimen Description BLOOD RIGHT ANTECUBITAL  Final   Special Requests   Final    BOTTLES DRAWN AEROBIC AND ANAEROBIC Blood Culture adequate volume   Culture  Setup Time   Final    GRAM NEGATIVE RODS IN BOTH AEROBIC AND ANAEROBIC BOTTLES Organism ID to follow CRITICAL RESULT CALLED TO, READ BACK BY AND VERIFIED WITH: Laqueta Carina, PHARMD AT [place] ON 08/25/20 BY GM Performed at [facility], [address], [place], [place] [address]    Culture GRAM NEGATIVE RODS  Final   Report Status PENDING  Incomplete  Resp Panel by RT-PCR (Flu A&B, Covid) Nasopharyngeal Swab     Status: None   Collection Time: 08/24/20 10:27 PM   Specimen: Nasopharyngeal Swab; Nasopharyngeal(NP) swabs in vial transport medium  Result Value Ref Range Status   SARS Coronavirus 2 by RT PCR NEGATIVE NEGATIVE Final    Comment: (NOTE) SARS-CoV-2 target nucleic acids are NOT DETECTED.  The SARS-CoV-2 RNA is generally detectable in upper respiratory specimens during the acute phase of infection. The lowest concentration of SARS-CoV-2 viral copies this assay can detect is 138 copies/mL. A negative result does not preclude SARS-Cov-2 infection and should not be used as the sole basis for treatment or other patient management decisions. A negative result may occur with  improper specimen collection/handling, submission of specimen other than nasopharyngeal swab, presence of viral mutation(s) within the areas targeted by this assay, and inadequate number of viral copies(<138 copies/mL). A negative result must be combined with clinical observations, patient history, and epidemiological information. The expected result is Negative.  Fact Sheet for Patients:  EntrepreneurPulse.com.au  Fact Sheet for Healthcare Providers:  IncredibleEmployment.be  This test is no t yet approved or cleared by the [place] FDA and  has been authorized for detection and/or diagnosis of SARS-CoV-2 by FDA under an Emergency Use Authorization (EUA). This EUA will remain  in effect (meaning this test can be used) for the duration of the COVID-19 declaration under Section 564(b)(1) of the Act, 21 U.S.C.section 360bbb-3(b)(1), unless the authorization is terminated  or revoked sooner.       Influenza A by PCR NEGATIVE NEGATIVE Final   Influenza B by PCR NEGATIVE NEGATIVE Final    Comment: (NOTE) The  Xpert Xpress SARS-CoV-2/FLU/RSV plus  assay is intended as an aid in the diagnosis of influenza from Nasopharyngeal swab specimens and should not be used as a sole basis for treatment. Nasal washings and aspirates are unacceptable for Xpert Xpress SARS-CoV-2/FLU/RSV testing.  Fact Sheet for Patients: EntrepreneurPulse.com.au  Fact Sheet for Healthcare Providers: IncredibleEmployment.be  This test is not yet approved or cleared by the [place] FDA and has been authorized for detection and/or diagnosis of SARS-CoV-2 by FDA under an Emergency Use Authorization (EUA). This EUA will remain in effect (meaning this test can be used) for the duration of the COVID-19 declaration under Section 564(b)(1) of the Act, 21 U.S.C. section 360bbb-3(b)(1), unless the authorization is terminated or revoked.  Performed at [facility], [address] [place], [place] [address]   Blood Culture ID Panel (Reflexed)     Status: Abnormal   Collection Time: 08/24/20 10:27 PM  Result Value Ref Range Status   Enterococcus faecalis NOT DETECTED NOT DETECTED Final   Enterococcus Faecium NOT DETECTED NOT DETECTED Final   Listeria monocytogenes NOT DETECTED NOT DETECTED Final   Staphylococcus species NOT DETECTED NOT DETECTED Final   Staphylococcus aureus (BCID) NOT DETECTED NOT DETECTED Final   Staphylococcus epidermidis NOT DETECTED NOT DETECTED Final   Staphylococcus lugdunensis NOT DETECTED NOT DETECTED Final   Streptococcus species NOT DETECTED NOT DETECTED Final   Streptococcus agalactiae NOT DETECTED NOT DETECTED Final   Streptococcus pneumoniae NOT DETECTED NOT DETECTED Final   Streptococcus pyogenes NOT DETECTED NOT DETECTED Final   A.calcoaceticus-baumannii NOT DETECTED NOT DETECTED Final   Bacteroides fragilis NOT DETECTED NOT DETECTED Final   Enterobacterales DETECTED (A) NOT DETECTED Final    Comment: Enterobacterales represent a large order of  gram negative bacteria, not a single organism. Refer to culture for further identification. CRITICAL RESULT CALLED TO, READ BACK BY AND VERIFIED WITH: SUSAN WATSON, PHARMD AT 1119 ON 08/25/20 BY GM    Enterobacter cloacae complex NOT DETECTED NOT DETECTED Final   Escherichia coli NOT DETECTED NOT DETECTED Final   Klebsiella aerogenes NOT DETECTED NOT DETECTED Final   Klebsiella oxytoca NOT DETECTED NOT DETECTED Final   Klebsiella pneumoniae NOT DETECTED NOT DETECTED Final   Proteus species NOT DETECTED NOT DETECTED Final   Salmonella species NOT DETECTED NOT DETECTED Final   Serratia marcescens NOT DETECTED NOT DETECTED Final   Haemophilus influenzae NOT DETECTED NOT DETECTED Final   Neisseria meningitidis NOT DETECTED NOT DETECTED Final   Pseudomonas aeruginosa NOT DETECTED NOT DETECTED Final   Stenotrophomonas maltophilia NOT DETECTED NOT DETECTED Final   Candida albicans NOT DETECTED NOT DETECTED Final   Candida auris NOT DETECTED NOT DETECTED Final   Candida glabrata NOT DETECTED NOT DETECTED Final   Candida krusei NOT DETECTED NOT DETECTED Final   Candida parapsilosis NOT DETECTED NOT DETECTED Final   Candida tropicalis NOT DETECTED NOT DETECTED Final   Cryptococcus neoformans/gattii NOT DETECTED NOT DETECTED Final   CTX-M ESBL NOT DETECTED NOT DETECTED Final   Carbapenem resistance IMP NOT DETECTED NOT DETECTED Final   Carbapenem resistance KPC NOT DETECTED NOT DETECTED Final   Carbapenem resistance NDM NOT DETECTED NOT DETECTED Final   Carbapenem resist OXA 48 LIKE NOT DETECTED NOT DETECTED Final   Carbapenem resistance VIM NOT DETECTED NOT DETECTED Final    Comment: Performed at [facility], [address], [place], [place] [address]     Radiological Exams on Admission: DG Chest 1 View  Result Date: 08/24/2020 CLINICAL DATA:  Sepsis EXAM: CHEST  1 VIEW  COMPARISON:  12/21/2018 FINDINGS: The heart size and mediastinal contours are within normal limits. Both lungs  are clear. The visualized skeletal structures are unremarkable. IMPRESSION: Normal study. Electronically Signed   By: Rolm Baptise M.D.   On: 08/24/2020 22:57   CT Angio Chest PE W and/or Wo Contrast  Result Date: 08/25/2020 CLINICAL DATA:  Pulmonary embolism, mid and low back pain, vomiting, fever, hematuria EXAM: CT ANGIOGRAPHY CHEST CT ABDOMEN AND PELVIS WITH CONTRAST TECHNIQUE: Multidetector CT imaging of the chest was performed using the standard protocol during bolus administration of intravenous contrast. Multiplanar CT image reconstructions and MIPs were obtained to evaluate the vascular anatomy. Multidetector CT imaging of the abdomen and pelvis was performed using the standard protocol during bolus administration of intravenous contrast. CONTRAST:  129m OMNIPAQUE IOHEXOL 350 MG/ML SOLN COMPARISON:  None. FINDINGS: CTA CHEST FINDINGS Cardiovascular: There is suboptimal opacification of the pulmonary arterial tree with preferential opacification of the thoracic aorta. The examination is adequate only for exclusion of intraluminal filling defect within the main, right, left, and lobar pulmonary arteries, of which there are none. The segmental and subsegmental pulmonary arteries are not well assessed on this examination. Central pulmonary arteries are of normal caliber. Cardiac size within normal limits. No pericardial effusion. No significant coronary artery calcification. The thoracic aorta is unremarkable. Mediastinum/Nodes: No enlarged mediastinal, hilar, or axillary lymph nodes. Thyroid gland, trachea, and esophagus demonstrate no significant findings. Lungs/Pleura: 7 mm subpleural ground-glass pulmonary nodule within the right middle lobe, axial image # 38/6 is indeterminate. The lungs are otherwise clear. No pneumothorax or pleural effusion. No central obstructing lesion. Musculoskeletal: No acute bone abnormality. No lytic or blastic bone lesion. Review of the MIP images confirms the above  findings. CT ABDOMEN and PELVIS FINDINGS Hepatobiliary: Moderate hepatic steatosis. No focal intrahepatic mass. No intra or extrahepatic biliary ductal dilation. Gallbladder unremarkable. Pancreas: Unremarkable Spleen: Unremarkable Adrenals/Urinary Tract: The adrenal glands are unremarkable. The kidneys are normal in position. There has developed asymmetric bilateral renal cortical scarring and asymmetric atrophy, left greater than right. No hydronephrosis. No intrarenal or ureteral calculi. The bladder is partially obscured, however, the visualized segment is unremarkable. Stomach/Bowel: Stomach is within normal limits. Appendix appears normal. No evidence of bowel wall thickening, distention, or inflammatory changes. No free intraperitoneal gas or fluid Vascular/Lymphatic: Mild aortoiliac atherosclerotic calcification. No aortic aneurysm. No pathologic adenopathy within the abdomen and pelvis. Reproductive: Uterus and bilateral adnexa are unremarkable. Other: Tiny fat containing umbilical hernia. The rectum is unremarkable. 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 No acute bone abnormality. Degenerative changes are seen within the lumbosacral junction. Review of the MIP images confirms the above findings. IMPRESSION: Technically limited examination with suboptimal opacification of the pulmonary arterial tree. No evidence of pulmonary embolism through the level of the lobar pulmonary arteries. The segmental and subsegmental pulmonary arteries are not well assessed. 7 mm indeterminate right middle lobe ground-glass pulmonary nodule. Initial follow-up with CT at 6-12 months is recommended to confirm persistence. If persistent, repeat CT is recommended every 2 years until 5 years of stability has been established. This recommendation follows the consensus statement: Guidelines for Management of Incidental Pulmonary Nodules Detected on CT Images: From the Fleischner Society 2017; Radiology  2017; 284:228-243. Moderate hepatic steatosis. Interval resolution of previously noted bilateral hydronephrosis and perinephric stranding. Interval development of asymmetric bilateral cortical scarring and atrophy, left greater than right. Aortic Atherosclerosis (ICD10-I70.0). Electronically Signed   By: AFidela SalisburyMD   On: 08/25/2020 00:27  MR Cervical Spine W or Wo Contrast  Result Date: 08/25/2020 CLINICAL DATA:  Initial evaluation for epidural abscess. Fever with mid back pain, history of IVDU. EXAM: MRI CERVICAL, THORACIC AND LUMBAR SPINE WITHOUT AND WITH CONTRAST TECHNIQUE: Multiplanar and multiecho pulse sequences of the cervical spine, to include the craniocervical junction and cervicothoracic junction, and thoracic and lumbar spine, were obtained without and with intravenous contrast. CONTRAST:  72m GADAVIST GADOBUTROL 1 MMOL/ML IV SOLN COMPARISON:  None available. FINDINGS: MRI CERVICAL SPINE FINDINGS Alignment: Straightening of the normal cervical lordosis. No listhesis. Vertebrae: Vertebral body height maintained without acute or chronic fracture. Bone marrow signal intensity diffusely decreased on T1 weighted imaging, nonspecific, but most commonly related to anemia, smoking, or obesity. No discrete or worrisome osseous lesions. No abnormal marrow edema or enhancement. No evidence for osteomyelitis discitis or septic arthritis. Cord: Normal signal morphology. No epidural abscess or other collection. No abnormal enhancement. Posterior Fossa, vertebral arteries, paraspinal tissues: 6 mm retention cyst noted at the nasopharynx. Otherwise unremarkable. Disc levels: C5-6: Degenerative intervertebral disc space narrowing with diffuse disc osteophyte complex. Flattening and partial effacement of the ventral thecal sac with resultant mild spinal stenosis. Moderate bilateral C6 foraminal narrowing. No other significant disc pathology seen within the cervical spine. No other stenosis or impingement. MRI  THORACIC SPINE FINDINGS Alignment: Physiologic with preservation of the normal thoracic kyphosis. No listhesis. Vertebrae: Vertebral body height maintained without acute or chronic fracture. Diffusely decreased T1 signal intensity seen throughout the visualized bone marrow. No worrisome osseous lesions. There is question of subtle paraspinous stranding and edema adjacent to the T8-9 interspace, best seen on axial sequences (series 19, image 26 and series 20, image 25, and series 21, images 26, 28). Suggestion of associated enhancement (series 23, image 27). Given the history of fever and mid back pain, changes are concerning for possible early/developing paraspinous infection. Subtle edema and enhancement seen within the adjacent endplates about the TF6-2interspace, which could reflect developing osteomyelitis. No convincing evidence for acute discitis within the intervening T8-9 disc itself at this time. No convincing epidural extension or enhancement. Otherwise, no other convincing evidence for acute infection seen elsewhere within the thoracic spine. Cord: Normal signal and morphology. No epidural abscess or enhancement. Diffuse prominence of the dorsal epidural fat noted. Paraspinal and other soft tissues: Subtle paraspinous stranding, edema, and enhancement adjacent to the T8-9 interspace as above. No discrete soft tissue collections. Trace layering bilateral pleural effusions, left slightly larger than right. Paraspinous soft tissues otherwise unremarkable. Disc levels: T8-9: Disc bulge with reactive endplate change and superimposed small left paracentral disc protrusion (series 20, image 26). Mild flattening of the left ventral cord without cord signal changes or significant spinal stenosis. T11-12: Disc bulge with reactive endplate spurring. Mild flattening of the ventral thecal sac. Superimposed mild facet hypertrophy without significant spinal stenosis. Foramina remain patent. Otherwise, no significant  disc pathology seen elsewhere within the thoracic spine. No other significant canal or foraminal stenosis. No neural impingement. MRI LUMBAR SPINE FINDINGS Segmentation: Standard. Lowest well-formed disc space labeled the L5-S1 level. Alignment: Physiologic with preservation of the normal lumbar lordosis. No listhesis. Vertebrae: Vertebral body height maintained without acute or chronic fracture. Diffusely decreased T1 weighted signal seen throughout the visualized bone marrow. No discrete or worrisome osseous lesions. No abnormal marrow edema or enhancement. No evidence for osteomyelitis discitis or septic arthritis within the lumbar spine. Conus medullaris and cauda equina: Conus extends to the L1 level. Conus and cauda equina appear normal. Paraspinal  and other soft tissues: Paraspinous soft tissues demonstrate no acute finding. Asymmetric fatty atrophy noted involving the left psoas muscle. Multifocal cortical scarring noted about the left greater than right kidneys. Visualized visceral structures otherwise unremarkable. Disc levels: L1-2: Anterior endplate spurring without significant disc bulge. Mild facet hypertrophy. No stenosis or impingement. L2-3:  Negative interspace.  Mild facet hypertrophy.  No stenosis. L3-4:  Negative interspace.  Mild facet hypertrophy.  No stenosis. L4-5: Negative interspace. Mild facet hypertrophy with trace joint effusion on the right. No stenosis. L5-S1: Negative interspace. Moderate right worse than left facet hypertrophy with associated prominent joint effusion on the right. No significant surrounding inflammatory changes to suggest septic arthritis. Mild epidural lipomatosis. No stenosis or impingement. IMPRESSION: 1. Question subtle paraspinous stranding and edema adjacent to the T8-9 interspace as above. Given the history of fever and mid back pain, changes are concerning for possible early/developing paraspinous infection. Question early changes of osteomyelitis about the  adjacent T8-9 interspace. No discrete or drainable fluid collections. 2. No other evidence for acute infection elsewhere within the cervical, thoracic, or lumbar spine. No epidural abscess. 3. 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 4. Small left paracentral disc protrusion at T8-9 with mild flattening of the left hemi cord. No significant spinal stenosis. 5. Moderate right worse than left facet hypertrophy at L5-S1 with associated prominent joint effusion on the right. No significant surrounding inflammatory changes to suggest septic arthritis. Finding could contribute to lower back pain. Electronically Signed   By: Jeannine Boga M.D.   On: 08/25/2020 05:12   MR THORACIC SPINE W WO CONTRAST  Result Date: 08/25/2020 CLINICAL DATA:  Initial evaluation for epidural abscess. Fever with mid back pain, history of IVDU. EXAM: MRI CERVICAL, THORACIC AND LUMBAR SPINE WITHOUT AND WITH CONTRAST TECHNIQUE: Multiplanar and multiecho pulse sequences of the cervical spine, to include the craniocervical junction and cervicothoracic junction, and thoracic and lumbar spine, were obtained without and with intravenous contrast. CONTRAST:  31m GADAVIST GADOBUTROL 1 MMOL/ML IV SOLN COMPARISON:  None available. FINDINGS: MRI CERVICAL SPINE FINDINGS Alignment: Straightening of the normal cervical lordosis. No listhesis. Vertebrae: Vertebral body height maintained without acute or chronic fracture. Bone marrow signal intensity diffusely decreased on T1 weighted imaging, nonspecific, but most commonly related to anemia, smoking, or obesity. No discrete or worrisome osseous lesions. No abnormal marrow edema or enhancement. No evidence for osteomyelitis discitis or septic arthritis. Cord: Normal signal morphology. No epidural abscess or other collection. No abnormal enhancement. Posterior Fossa, vertebral arteries, paraspinal tissues: 6 mm retention cyst  noted at the nasopharynx. Otherwise unremarkable. Disc levels: C5-6: Degenerative intervertebral disc space narrowing with diffuse disc osteophyte complex. Flattening and partial effacement of the ventral thecal sac with resultant mild spinal stenosis. Moderate bilateral C6 foraminal narrowing. No other significant disc pathology seen within the cervical spine. No other stenosis or impingement. MRI THORACIC SPINE FINDINGS Alignment: Physiologic with preservation of the normal thoracic kyphosis. No listhesis. Vertebrae: Vertebral body height maintained without acute or chronic fracture. Diffusely decreased T1 signal intensity seen throughout the visualized bone marrow. No worrisome osseous lesions. There is question of subtle paraspinous stranding and edema adjacent to the T8-9 interspace, best seen on axial sequences (series 19, image 26 and series 20, image 25, and series 21, images 26, 28). Suggestion of associated enhancement (series 23, image 27). Given the history of fever and mid back pain, changes are concerning for possible early/developing paraspinous infection. Subtle edema  and enhancement seen within the adjacent endplates about the R4-8 interspace, which could reflect developing osteomyelitis. No convincing evidence for acute discitis within the intervening T8-9 disc itself at this time. No convincing epidural extension or enhancement. Otherwise, no other convincing evidence for acute infection seen elsewhere within the thoracic spine. Cord: Normal signal and morphology. No epidural abscess or enhancement. Diffuse prominence of the dorsal epidural fat noted. Paraspinal and other soft tissues: Subtle paraspinous stranding, edema, and enhancement adjacent to the T8-9 interspace as above. No discrete soft tissue collections. Trace layering bilateral pleural effusions, left slightly larger than right. Paraspinous soft tissues otherwise unremarkable. Disc levels: T8-9: Disc bulge with reactive endplate change  and superimposed small left paracentral disc protrusion (series 20, image 26). Mild flattening of the left ventral cord without cord signal changes or significant spinal stenosis. T11-12: Disc bulge with reactive endplate spurring. Mild flattening of the ventral thecal sac. Superimposed mild facet hypertrophy without significant spinal stenosis. Foramina remain patent. Otherwise, no significant disc pathology seen elsewhere within the thoracic spine. No other significant canal or foraminal stenosis. No neural impingement. MRI LUMBAR SPINE FINDINGS Segmentation: Standard. Lowest well-formed disc space labeled the L5-S1 level. Alignment: Physiologic with preservation of the normal lumbar lordosis. No listhesis. Vertebrae: Vertebral body height maintained without acute or chronic fracture. Diffusely decreased T1 weighted signal seen throughout the visualized bone marrow. No discrete or worrisome osseous lesions. No abnormal marrow edema or enhancement. No evidence for osteomyelitis discitis or septic arthritis within the lumbar spine. Conus medullaris and cauda equina: Conus extends to the L1 level. Conus and cauda equina appear normal. Paraspinal and other soft tissues: Paraspinous soft tissues demonstrate no acute finding. Asymmetric fatty atrophy noted involving the left psoas muscle. Multifocal cortical scarring noted about the left greater than right kidneys. Visualized visceral structures otherwise unremarkable. Disc levels: L1-2: Anterior endplate spurring without significant disc bulge. Mild facet hypertrophy. No stenosis or impingement. L2-3:  Negative interspace.  Mild facet hypertrophy.  No stenosis. L3-4:  Negative interspace.  Mild facet hypertrophy.  No stenosis. L4-5: Negative interspace. Mild facet hypertrophy with trace joint effusion on the right. No stenosis. L5-S1: Negative interspace. Moderate right worse than left facet hypertrophy with associated prominent joint effusion on the right. No  significant surrounding inflammatory changes to suggest septic arthritis. Mild epidural lipomatosis. No stenosis or impingement. IMPRESSION: 1. Question subtle paraspinous stranding and edema adjacent to the T8-9 interspace as above. Given the history of fever and mid back pain, changes are concerning for possible early/developing paraspinous infection. Question early changes of osteomyelitis about the adjacent T8-9 interspace. No discrete or drainable fluid collections. 2. No other evidence for acute infection elsewhere within the cervical, thoracic, or lumbar spine. No epidural abscess. 3. 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 4. Small left paracentral disc protrusion at T8-9 with mild flattening of the left hemi cord. No significant spinal stenosis. 5. Moderate right worse than left facet hypertrophy at L5-S1 with associated prominent joint effusion on the right. No significant surrounding inflammatory changes to suggest septic arthritis. Finding could contribute to lower back pain. Electronically Signed   By: Jeannine Boga M.D.   On: 08/25/2020 05:12   MR Lumbar Spine W Wo Contrast  Result Date: 08/25/2020 CLINICAL DATA:  Initial evaluation for epidural abscess. Fever with mid back pain, history of IVDU. EXAM: MRI CERVICAL, THORACIC AND LUMBAR SPINE WITHOUT AND WITH CONTRAST TECHNIQUE: Multiplanar and multiecho pulse sequences of the  cervical spine, to include the craniocervical junction and cervicothoracic junction, and thoracic and lumbar spine, were obtained without and with intravenous contrast. CONTRAST:  5m GADAVIST GADOBUTROL 1 MMOL/ML IV SOLN COMPARISON:  None available. FINDINGS: MRI CERVICAL SPINE FINDINGS Alignment: Straightening of the normal cervical lordosis. No listhesis. Vertebrae: Vertebral body height maintained without acute or chronic fracture. Bone marrow signal intensity diffusely decreased on T1  weighted imaging, nonspecific, but most commonly related to anemia, smoking, or obesity. No discrete or worrisome osseous lesions. No abnormal marrow edema or enhancement. No evidence for osteomyelitis discitis or septic arthritis. Cord: Normal signal morphology. No epidural abscess or other collection. No abnormal enhancement. Posterior Fossa, vertebral arteries, paraspinal tissues: 6 mm retention cyst noted at the nasopharynx. Otherwise unremarkable. Disc levels: C5-6: Degenerative intervertebral disc space narrowing with diffuse disc osteophyte complex. Flattening and partial effacement of the ventral thecal sac with resultant mild spinal stenosis. Moderate bilateral C6 foraminal narrowing. No other significant disc pathology seen within the cervical spine. No other stenosis or impingement. MRI THORACIC SPINE FINDINGS Alignment: Physiologic with preservation of the normal thoracic kyphosis. No listhesis. Vertebrae: Vertebral body height maintained without acute or chronic fracture. Diffusely decreased T1 signal intensity seen throughout the visualized bone marrow. No worrisome osseous lesions. There is question of subtle paraspinous stranding and edema adjacent to the T8-9 interspace, best seen on axial sequences (series 19, image 26 and series 20, image 25, and series 21, images 26, 28). Suggestion of associated enhancement (series 23, image 27). Given the history of fever and mid back pain, changes are concerning for possible early/developing paraspinous infection. Subtle edema and enhancement seen within the adjacent endplates about the TE3-1interspace, which could reflect developing osteomyelitis. No convincing evidence for acute discitis within the intervening T8-9 disc itself at this time. No convincing epidural extension or enhancement. Otherwise, no other convincing evidence for acute infection seen elsewhere within the thoracic spine. Cord: Normal signal and morphology. No epidural abscess or  enhancement. Diffuse prominence of the dorsal epidural fat noted. Paraspinal and other soft tissues: Subtle paraspinous stranding, edema, and enhancement adjacent to the T8-9 interspace as above. No discrete soft tissue collections. Trace layering bilateral pleural effusions, left slightly larger than right. Paraspinous soft tissues otherwise unremarkable. Disc levels: T8-9: Disc bulge with reactive endplate change and superimposed small left paracentral disc protrusion (series 20, image 26). Mild flattening of the left ventral cord without cord signal changes or significant spinal stenosis. T11-12: Disc bulge with reactive endplate spurring. Mild flattening of the ventral thecal sac. Superimposed mild facet hypertrophy without significant spinal stenosis. Foramina remain patent. Otherwise, no significant disc pathology seen elsewhere within the thoracic spine. No other significant canal or foraminal stenosis. No neural impingement. MRI LUMBAR SPINE FINDINGS Segmentation: Standard. Lowest well-formed disc space labeled the L5-S1 level. Alignment: Physiologic with preservation of the normal lumbar lordosis. No listhesis. Vertebrae: Vertebral body height maintained without acute or chronic fracture. Diffusely decreased T1 weighted signal seen throughout the visualized bone marrow. No discrete or worrisome osseous lesions. No abnormal marrow edema or enhancement. No evidence for osteomyelitis discitis or septic arthritis within the lumbar spine. Conus medullaris and cauda equina: Conus extends to the L1 level. Conus and cauda equina appear normal. Paraspinal and other soft tissues: Paraspinous soft tissues demonstrate no acute finding. Asymmetric fatty atrophy noted involving the left psoas muscle. Multifocal cortical scarring noted about the left greater than right kidneys. Visualized visceral structures otherwise unremarkable. Disc levels: L1-2: Anterior endplate spurring without significant disc bulge.  Mild facet  hypertrophy. No stenosis or impingement. L2-3:  Negative interspace.  Mild facet hypertrophy.  No stenosis. L3-4:  Negative interspace.  Mild facet hypertrophy.  No stenosis. L4-5: Negative interspace. Mild facet hypertrophy with trace joint effusion on the right. No stenosis. L5-S1: Negative interspace. Moderate right worse than left facet hypertrophy with associated prominent joint effusion on the right. No significant surrounding inflammatory changes to suggest septic arthritis. Mild epidural lipomatosis. No stenosis or impingement. IMPRESSION: 1. Question subtle paraspinous stranding and edema adjacent to the T8-9 interspace as above. Given the history of fever and mid back pain, changes are concerning for possible early/developing paraspinous infection. Question early changes of osteomyelitis about the adjacent T8-9 interspace. No discrete or drainable fluid collections. 2. No other evidence for acute infection elsewhere within the cervical, thoracic, or lumbar spine. No epidural abscess. 3. 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 4. Small left paracentral disc protrusion at T8-9 with mild flattening of the left hemi cord. No significant spinal stenosis. 5. Moderate right worse than left facet hypertrophy at L5-S1 with associated prominent joint effusion on the right. No significant surrounding inflammatory changes to suggest septic arthritis. Finding could contribute to lower back pain. Electronically Signed   By: Jeannine Boga M.D.   On: 08/25/2020 05:12   CT ABDOMEN PELVIS W CONTRAST  Result Date: 08/25/2020 CLINICAL DATA:  Pulmonary embolism, mid and low back pain, vomiting, fever, hematuria EXAM: CT ANGIOGRAPHY CHEST CT ABDOMEN AND PELVIS WITH CONTRAST TECHNIQUE: Multidetector CT imaging of the chest was performed using the standard protocol during bolus administration of intravenous contrast. Multiplanar CT image  reconstructions and MIPs were obtained to evaluate the vascular anatomy. Multidetector CT imaging of the abdomen and pelvis was performed using the standard protocol during bolus administration of intravenous contrast. CONTRAST:  166m OMNIPAQUE IOHEXOL 350 MG/ML SOLN COMPARISON:  None. FINDINGS: CTA CHEST FINDINGS Cardiovascular: There is suboptimal opacification of the pulmonary arterial tree with preferential opacification of the thoracic aorta. The examination is adequate only for exclusion of intraluminal filling defect within the main, right, left, and lobar pulmonary arteries, of which there are none. The segmental and subsegmental pulmonary arteries are not well assessed on this examination. Central pulmonary arteries are of normal caliber. Cardiac size within normal limits. No pericardial effusion. No significant coronary artery calcification. The thoracic aorta is unremarkable. Mediastinum/Nodes: No enlarged mediastinal, hilar, or axillary lymph nodes. Thyroid gland, trachea, and esophagus demonstrate no significant findings. Lungs/Pleura: 7 mm subpleural ground-glass pulmonary nodule within the right middle lobe, axial image # 38/6 is indeterminate. The lungs are otherwise clear. No pneumothorax or pleural effusion. No central obstructing lesion. Musculoskeletal: No acute bone abnormality. No lytic or blastic bone lesion. Review of the MIP images confirms the above findings. CT ABDOMEN and PELVIS FINDINGS Hepatobiliary: Moderate hepatic steatosis. No focal intrahepatic mass. No intra or extrahepatic biliary ductal dilation. Gallbladder unremarkable. Pancreas: Unremarkable Spleen: Unremarkable Adrenals/Urinary Tract: The adrenal glands are unremarkable. The kidneys are normal in position. There has developed asymmetric bilateral renal cortical scarring and asymmetric atrophy, left greater than right. No hydronephrosis. No intrarenal or ureteral calculi. The bladder is partially obscured, however, the  visualized segment is unremarkable. Stomach/Bowel: Stomach is within normal limits. Appendix appears normal. No evidence of bowel wall thickening, distention, or inflammatory changes. No free intraperitoneal gas or fluid Vascular/Lymphatic: Mild aortoiliac atherosclerotic calcification. No aortic aneurysm. No pathologic adenopathy within the abdomen and pelvis. Reproductive: Uterus and bilateral  adnexa are unremarkable. Other: Tiny fat containing umbilical hernia. The rectum is unremarkable. 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 No acute bone abnormality. Degenerative changes are seen within the lumbosacral junction. Review of the MIP images confirms the above findings. IMPRESSION: Technically limited examination with suboptimal opacification of the pulmonary arterial tree. No evidence of pulmonary embolism through the level of the lobar pulmonary arteries. The segmental and subsegmental pulmonary arteries are not well assessed. 7 mm indeterminate right middle lobe ground-glass pulmonary nodule. Initial follow-up with CT at 6-12 months is recommended to confirm persistence. If persistent, repeat CT is recommended every 2 years until 5 years of stability has been established. This recommendation follows the consensus statement: Guidelines for Management of Incidental Pulmonary Nodules Detected on CT Images: From the Fleischner Society 2017; Radiology 2017; 284:228-243. Moderate hepatic steatosis. Interval resolution of previously noted bilateral hydronephrosis and perinephric stranding. Interval development of asymmetric bilateral cortical scarring and atrophy, left greater than right. Aortic Atherosclerosis (ICD10-I70.0). Electronically Signed   By: Fidela Salisbury MD   On: 08/25/2020 00:27     EKG: I have personally reviewed.  Sinus tachycardia, QTC 445, left atrial enlargement, poor R wave progression  Assessment/Plan Principal Problem:   Osteomyelitis of thoracic spine  (HCC) Active Problems:   Alcohol abuse   Tobacco use disorder   Sepsis ([place])   History of asthma   Back pain   Polysubstance abuse (HCC)   IVDU (intravenous drug user)   HTN (hypertension)   Depression with anxiety   Nausea & vomiting   Hypokalemia   Hypomagnesemia   Lung nodule   UTI (urinary tract infection)   Cellulitis of right hand   Bacteremia due to Gram-negative bacteria  Sepsis due to bacteremia with gram-negative bacteria and possible osteomyelitis of thoracic spine, paraspinous infection and UTI: Patient meets criteria for sepsis with fever 101.3, tachycardia with heart rate 144, RR 24.  Also has mild leukocytosis.  Lactic acid is normal.  Currently hemodynamically stable.  Dr. Gale Journey of ID is consulted, he recommended to get diagnostic biopsy.  IR is consulted, per Dr. Annamaria Boots of IR,  there is no fluid in the area in question. Due to the low probability of success coupled with the patients' positive blood culture, patient is not a candidate for this procedure.  - Admitted to MedSurg bed as inpatient - Empiric antimicrobial treatment with vancomycin and cefepime - Blood cultures and urine culture - ESR and CRP - will get Procalcitonin --> 8.50 - IVF: 2.0 L of LR, followed by 100 cc/h of NS - prn Tylenol and ketorolac for pain  Polysubstance abuse and IVDU, tobacco use disorder and alcohol abuse: -UDS negative today -Nicotine patch -CiWA protcol  History of asthma -Bronchodilators  HTN (hypertension): Patient is currently not taking medications.  Blood pressure 149/84 -IV hydralazine as needed -Will not start oral blood pressure medications since patient is at high risk of developing hypotension due to sepsis  Depression with anxiety: Patient not taking medications currently -As needed Ativan  Nausea & vomiting: CT abdomen/pelvis is not impressive.  May be due to UTI -As needed Zofran -Check lipase  Hypokalemia and Hypomagnesemia: Potassium 3.0, magnesium  1.2 -Repleted both  Lung nodule: Incidental findings per CT angiogram -Follow-up with PCP  UTI (urinary tract infection) -Antibiotics as above -Follow-up urine culture  Cellulitis of right hand: Patient has a small area of cellulitis in the base of her right thumb -Antibiotics as above  DVT ppx: SQ Heparin   Code Status: Full code Family Communication: not done, no family member is at bed side.     Disposition Plan:  Anticipate discharge back to previous environment Consults called:   Admission status and Level of care: Med-Surg:    as inpt      Status is: Inpatient  Remains inpatient appropriate because:Inpatient level of care appropriate due to severity of illness  Dispo: The patient is from: Home              Anticipated d/c is to: Home              Patient currently is not medically stable to d/c.   Difficult to place patient No               Date of Service 08/25/2020    [facility]   If 7PM-7AM, please contact night-coverage [email or web address] 08/25/2020, 6:35 PM

## 2020-08-26 ENCOUNTER — Inpatient Hospital Stay (HOSPITAL_COMMUNITY)
Admit: 2020-08-26 | Discharge: 2020-08-26 | Disposition: A | Discharge status: Home / Self Care | Payer: Disability Insurance | Attending: Internal Medicine | Admitting: Internal Medicine

## 2020-08-26 DIAGNOSIS — M4624 Osteomyelitis of vertebra, thoracic region: Secondary | ICD-10-CM | POA: Diagnosis not present

## 2020-08-26 DIAGNOSIS — R7881 Bacteremia: Secondary | ICD-10-CM

## 2020-08-26 LAB — BASIC METABOLIC PANEL
Anion gap: 6 (ref 5–15)
BUN: 8 mg/dL (ref 6–20)
CO2: 26 mmol/L (ref 22–32)
Calcium: 7.8 mg/dL — ABNORMAL LOW (ref 8.9–10.3)
Chloride: 106 mmol/L (ref 98–111)
Creatinine, Ser: 0.69 mg/dL (ref 0.44–1.00)
GFR, Estimated: >60 mL/min (ref >60)
Glucose, Bld: 89 mg/dL (ref 70–99)
Potassium: 3.7 mmol/L (ref 3.5–5.1)
Sodium: 138 mmol/L (ref 135–145)

## 2020-08-26 LAB — RPR: RPR Ser Ql: NONREACTIVE

## 2020-08-26 LAB — CBC
HCT: 33.3 % — ABNORMAL LOW (ref 36.0–46.0)
Hemoglobin: 9.9 g/dL — ABNORMAL LOW (ref 12.0–15.0)
MCH: 22 pg — ABNORMAL LOW (ref 26.0–34.0)
MCHC: 29.7 g/dL — ABNORMAL LOW (ref 30.0–36.0)
MCV: 74 fL — ABNORMAL LOW (ref 80.0–100.0)
Platelets: 184 10*3/uL (ref 150–400)
RBC: 4.5 MIL/uL (ref 3.87–5.11)
RDW: 21.9 % — ABNORMAL HIGH (ref 11.5–15.5)
WBC: 8.4 10*3/uL (ref 4.0–10.5)
nRBC: 0 % (ref 0.0–0.2)

## 2020-08-26 LAB — ECHOCARDIOGRAM COMPLETE
AR max vel: 1.67 cm2
AV Peak grad: 11.2 mmHg
Ao pk vel: 1.67 m/s
Area-P 1/2: 4.8 cm2
Height: 62 in
S' Lateral: 3.34 cm
Weight: 3947.12 oz

## 2020-08-26 LAB — HEPATITIS PANEL, ACUTE
HCV Ab: REACTIVE — AB
Hep A IgM: NONREACTIVE
Hep B C IgM: NONREACTIVE
Hepatitis B Surface Ag: NONREACTIVE

## 2020-08-26 LAB — PROCALCITONIN: Procalcitonin: 6.54 ng/mL

## 2020-08-26 MED ORDER — MAGNESIUM SULFATE 2 GM/50ML IV SOLN
2.0000 g | Freq: Once | INTRAVENOUS | Status: AC
  Administered 2020-08-26: 2 g via INTRAVENOUS
  Filled 2020-08-26: qty 50

## 2020-08-26 NOTE — Progress Notes (Signed)
*  PRELIMINARY RESULTS* Echocardiogram 2D Echocardiogram has been performed.  Brenda [name] 08/26/2020, 3:33 PM

## 2020-08-26 NOTE — Progress Notes (Signed)
PROGRESS NOTE    Brenda [name]  WUJ:[id number] DOB: Oct 06, 1970 DOA: 08/24/2020 PCP: Rolm Gala, NP    Brief Narrative:  50 year old female with history of IV drug use ongoing cocaine, heroin, alcohol and a smoker, hepatitis C, hypertension asthma GERD and depression bipolar disorder presented to the emergency room with back pain, nausea vomiting and dysuria for about 4 days. At the emergency room she was found to be bacteremic with immediate blood cultures growing 4 out of 4 gram-negative rods, WBC 11.1, negative COVID-19.  Potassium and magnesium are low.  MRI of the cervical thoracic and lumbar spine showed paraspinous stranding and edema adjacent to T8 and 9 interspace.  Admitted with broad-spectrum antibiotics.  CT of the chest and abdomen pelvis without any acute findings.     Assessment & Plan:   Principal Problem:   Osteomyelitis of thoracic spine (HCC) Active Problems:   Alcohol abuse   Tobacco use disorder   Sepsis (HCC)   History of asthma   Back pain   Polysubstance abuse (HCC)   IVDU (intravenous drug user)   HTN (hypertension)   Depression with anxiety   Nausea & vomiting   Hypokalemia   Hypomagnesemia   Lung nodule   UTI (urinary tract infection)   Cellulitis of right hand   Bacteremia due to Gram-negative bacteria  Sepsis due to gram-negative bacteremia, suspected osteomyelitis of thoracic spine, suspected UTI: Blood cultures 7/29 positive for gram-negative bacteria Blood culture 7/31 pending, no growth so far Urine cultures pending IR evaluated for possible percutaneous biopsy, not done due to chances of low yield. Vancomycin and cefepime.  Followed by infectious disease. 2D echocardiogram pending.  Polysubstance abuse with IV drug use, smoker and alcohol use: NSAIDs.  CIWA protocol with benzodiazepines.  Nicotine patch.  Hypokalemia and hypomagnesemia: Replace aggressively.  Recheck level tomorrow morning.  We will replace magnesium further  with IV magnesium today.    DVT prophylaxis: heparin injection 5,000 Units Start: 08/25/20 1400   Code Status: Full code Family Communication: None Disposition Plan: Status is: Inpatient  Remains inpatient appropriate because:Inpatient level of care appropriate due to severity of illness  Dispo: The patient is from: Home              Anticipated d/c is to: Home              Patient currently is not medically stable to d/c.   Difficult to place patient No         Consultants:  Infectious disease  Procedures:  None  Antimicrobials:  Antibiotics Given (last 72 hours)     Date/Time Action Medication Dose Rate   08/24/20 2345 New Bag/Given   vancomycin (VANCOCIN) IVPB 1000 mg/200 mL premix 1,000 mg 200 mL/hr   08/24/20 2346 New Bag/Given   ceFEPIme (MAXIPIME) 2 g in sodium chloride 0.9 % 100 mL IVPB 2 g 200 mL/hr   08/25/20 0328 New Bag/Given   Vancomycin (VANCOCIN) 1,500 mg in sodium chloride 0.9 % 500 mL IVPB 1,500 mg 250 mL/hr   08/25/20 0957 New Bag/Given   ceFEPIme (MAXIPIME) 2 g in sodium chloride 0.9 % 100 mL IVPB 2 g 200 mL/hr   08/25/20 1709 New Bag/Given   ceFEPIme (MAXIPIME) 2 g in sodium chloride 0.9 % 100 mL IVPB 2 g 200 mL/hr   08/25/20 2130 New Bag/Given   vancomycin (VANCOREADY) IVPB 1500 mg/300 mL 1,500 mg 150 mL/hr   08/26/20 0246 New Bag/Given   ceFEPIme (MAXIPIME) 2 g in sodium  chloride 0.9 % 100 mL IVPB 2 g 200 mL/hr   08/26/20 0853 New Bag/Given   ceFEPIme (MAXIPIME) 2 g in sodium chloride 0.9 % 100 mL IVPB 2 g 200 mL/hr          Subjective: Patient seen and examined.  Complains of some backache, about 7 out of 10.  She is mobilizing to the bathroom.  Denies any incontinence, denies any lower extremity weaknesses.  Denies any nausea or vomiting.  Remains afebrile.  Objective: Vitals:   08/25/20 0651 08/25/20 1958 08/26/20 0443 08/26/20 0733  BP:  113/72 127/81 (!) 141/90  Pulse:  85 84 83  Resp:  Temp:  98.1 F (36.7 C) 98.2  F (36.8 C) 98.3 F (36.8 C)  TempSrc:  Oral Oral   SpO2:  98% 97% 98%  Weight: 111.9 kg     Height:  (1.575 m)       Intake/Output Summary (Last 24 hours) at 08/26/2020 1420 Last data filed at 08/26/2020 1300 Gross per 24 hour  Intake 1956.85 ml  Output --  Net 1956.85 ml   Filed Weights   08/24/20 2213 08/25/20 0651  Weight: 113.4 kg 111.9 kg    Examination:  General exam: Appears calm and comfortable at rest. Respiratory system: Clear to auscultation. Respiratory effort normal. Cardiovascular system: S1 & S2 heard, RRR.  Gastrointestinal system: Abdomen is nondistended, soft and nontender. No organomegaly or masses felt. Normal bowel sounds heard. Central nervous system: Alert and oriented. No focal neurological deficits. Extremities: Symmetric 5 x 5 power. Skin: No rashes, lesions or ulcers Psychiatry: Judgement and insight appear normal. Mood is normal & affect is flat.    Data Reviewed: I have personally reviewed following labs and imaging studies  CBC: Recent Labs  Lab 08/24/20 2227 08/26/20 0549  WBC 11.1* 8.4  NEUTROABS 9.6*  --   HGB 12.3 9.9*  HCT 39.2 33.3*  MCV 73.1* 74.0*  PLT 179 184   Basic Metabolic Panel: Recent Labs  Lab 08/24/20 2227 08/25/20 0319 08/26/20 0549  NA 134*  --  138  K 3.0*  --  3.7  CL 96*  --  106  CO2 26  --  26  GLUCOSE 156*  --  89  BUN 7  --  8  CREATININE 0.93  --  0.69  CALCIUM 9.3  --  7.8*  MG 1.2*  --   --   PHOS  --  2.6  --    GFR: Estimated Creatinine Clearance: 101.6 mL/min (by C-G formula based on SCr of 0.69 mg/dL). Liver Function Tests: Recent Labs  Lab 08/24/20 2227  AST 36  ALT 58*  ALKPHOS 93  BILITOT 1.0  PROT 7.5  ALBUMIN 3.0*   Recent Labs  Lab 08/25/20 0319  LIPASE 24   No results for input(s): AMMONIA in the last 168 hours. Coagulation Profile: Recent Labs  Lab 08/24/20 2227  INR 1.1   Cardiac Enzymes: No results for input(s): CKTOTAL, CKMB, CKMBINDEX, TROPONINI in  the last 168 hours. BNP (last 3 results) No results for input(s): PROBNP in the last 8760 hours. HbA1C: No results for input(s): HGBA1C in the last 72 hours. CBG: No results for input(s): GLUCAP in the last 168 hours. Lipid Profile: No results for input(s): CHOL, HDL, LDLCALC, TRIG, CHOLHDL, LDLDIRECT in the last 72 hours. Thyroid Function Tests: No results for input(s): TSH, T4TOTAL, FREET4, T3FREE, THYROIDAB in the last 72 hours. Anemia Panel: No results for input(s): VITAMINB12,  FOLATE, FERRITIN, TIBC, IRON, RETICCTPCT in the last 72 hours. Sepsis Labs: Recent Labs  Lab 08/24/20 2227 08/25/20 0319 08/26/20 0549  PROCALCITON 5.58 8.50 6.54  LATICACIDVEN 2.3* 0.8  --     Recent Results (from the past 240 hour(s))  Culture, blood (Routine x 2)     Status: None (Preliminary result)   Collection Time: 08/24/20 10:27 PM   Specimen: BLOOD  Result Value Ref Range Status   Specimen Description   Final    BLOOD LEFT ANTECUBITAL Performed at [facility], [address], [place], [place] [address]    Special Requests   Final    BOTTLES DRAWN AEROBIC AND ANAEROBIC Blood Culture adequate volume Performed at [facility], [address] [place], [place] [address]    Culture  Setup Time   Final    GRAM NEGATIVE RODS IN BOTH AEROBIC AND ANAEROBIC BOTTLES CRITICAL VALUE NOTED.  VALUE IS CONSISTENT WITH PREVIOUSLY REPORTED AND CALLED VALUE. Performed at [facility], [address], [place], [place] [address]    Culture   Final    Romie Minus NEGATIVE RODS IDENTIFICATION TO FOLLOW Performed at [facility], [address], [place], [place] [address]    Report Status PENDING  Incomplete  Culture, blood (Routine x 2)     Status: None (Preliminary result)   Collection Time: 08/24/20 10:27 PM   Specimen: BLOOD  Result Value Ref Range Status   Specimen Description   Final    BLOOD RIGHT ANTECUBITAL Performed at [facility], [address] [place], [place] [address]    Special Requests   Final    BOTTLES DRAWN AEROBIC AND ANAEROBIC Blood Culture adequate volume Performed at [facility], [address] [place], [place] [address]    Culture  Setup Time   Final    GRAM NEGATIVE RODS IN BOTH AEROBIC AND ANAEROBIC BOTTLES CRITICAL RESULT CALLED TO, READ BACK BY AND VERIFIED WITH: SUSAN WATSON, PHARMD AT 1119 ON 08/25/20 BY GM    Culture   Final    GRAM NEGATIVE RODS IDENTIFICATION AND SUSCEPTIBILITIES TO FOLLOW Performed at [facility], [address], [place], [place] [address]    Report Status PENDING  Incomplete  Resp Panel by RT-PCR (Flu A&B, Covid) Nasopharyngeal Swab     Status: None   Collection Time: 08/24/20 10:27 PM   Specimen: Nasopharyngeal Swab; Nasopharyngeal(NP) swabs in vial transport medium  Result Value Ref Range Status   SARS Coronavirus 2 by RT PCR NEGATIVE NEGATIVE Final    Comment: (NOTE) SARS-CoV-2 target nucleic acids are NOT DETECTED.  The SARS-CoV-2 RNA is generally detectable in upper respiratory specimens during the acute phase of infection. The lowest concentration of SARS-CoV-2 viral copies this assay can detect is 138 copies/mL. A negative result does not preclude SARS-Cov-2 infection and should not be used as the sole basis for treatment or other patient management decisions. A negative result may occur with  improper specimen collection/handling, submission of specimen other than nasopharyngeal swab, presence of viral mutation(s) within the areas targeted by this assay, and inadequate number of viral copies(<138 copies/mL). A negative result must be combined with clinical observations, patient history, and epidemiological information. The expected result is Negative.  Fact Sheet for Patients:  BloggerCourse.com  Fact Sheet for Healthcare Providers:  SeriousBroker.it  This test is no t yet approved or cleared by the  [place] FDA and  has been authorized for detection and/or diagnosis of SARS-CoV-2 by  FDA under an Emergency Use Authorization (EUA). This EUA will remain  in effect (meaning this test can be used) for the duration of the COVID-19 declaration under Section 564(b)(1) of the Act, 21 U.S.C.section 360bbb-3(b)(1), unless the authorization is terminated  or revoked sooner.       Influenza A by PCR NEGATIVE NEGATIVE Final   Influenza B by PCR NEGATIVE NEGATIVE Final    Comment: (NOTE) The Xpert Xpress SARS-CoV-2/FLU/RSV plus assay is intended as an aid in the diagnosis of influenza from Nasopharyngeal swab specimens and should not be used as a sole basis for treatment. Nasal washings and aspirates are unacceptable for Xpert Xpress SARS-CoV-2/FLU/RSV testing.  Fact Sheet for Patients: BloggerCourse.comhttps://www.fda.gov/media/152166/download  Fact Sheet for Healthcare Providers: SeriousBroker.ithttps://www.fda.gov/media/152162/download  This test is not yet approved or cleared by the [place] FDA and has been authorized for detection and/or diagnosis of SARS-CoV-2 by FDA under an Emergency Use Authorization (EUA). This EUA will remain in effect (meaning this test can be used) for the duration of the COVID-19 declaration under Section 564(b)(1) of the Act, 21 U.S.C. section 360bbb-3(b)(1), unless the authorization is terminated or revoked.  Performed at [facility], [address] [place], [place] [address]   Urine Culture     Status: Abnormal (Preliminary result)   Collection Time: 08/24/20 10:27 PM   Specimen: Urine, Clean Catch  Result Value Ref Range Status   Specimen Description   Final    URINE, CLEAN CATCH Performed at [facility], [address] [place], [place] [address]    Special Requests   Final    NONE Performed at [facility], [address] [place], [place] [address]    Culture (A)  Final    >=100,000 COLONIES/mL Romie MinusGRAM NEGATIVE  RODS SUSCEPTIBILITIES TO FOLLOW Performed at [facility], [address] [place], [place] [address]    Report Status PENDING  Incomplete  Blood Culture ID Panel (Reflexed)     Status: Abnormal   Collection Time: 08/24/20 10:27 PM  Result Value Ref Range Status   Enterococcus faecalis NOT DETECTED NOT DETECTED Final   Enterococcus Faecium NOT DETECTED NOT DETECTED Final   Listeria monocytogenes NOT DETECTED NOT DETECTED Final   Staphylococcus species NOT DETECTED NOT DETECTED Final   Staphylococcus aureus (BCID) NOT DETECTED NOT DETECTED Final   Staphylococcus epidermidis NOT DETECTED NOT DETECTED Final   Staphylococcus lugdunensis NOT DETECTED NOT DETECTED Final   Streptococcus species NOT DETECTED NOT DETECTED Final   Streptococcus agalactiae NOT DETECTED NOT DETECTED Final   Streptococcus pneumoniae NOT DETECTED NOT DETECTED Final   Streptococcus pyogenes NOT DETECTED NOT DETECTED Final   A.calcoaceticus-baumannii NOT DETECTED NOT DETECTED Final   Bacteroides fragilis NOT DETECTED NOT DETECTED Final   Enterobacterales DETECTED (A) NOT DETECTED Final    Comment: Enterobacterales represent a large order of gram negative bacteria, not a single organism. Refer to culture for further identification. CRITICAL RESULT CALLED TO, READ BACK BY AND VERIFIED WITH: SUSAN WATSON, PHARMD AT 1119 ON 08/25/20 BY GM    Enterobacter cloacae complex NOT DETECTED NOT DETECTED Final   Escherichia coli NOT DETECTED NOT DETECTED Final   Klebsiella aerogenes NOT DETECTED NOT DETECTED Final   Klebsiella oxytoca NOT DETECTED NOT DETECTED Final   Klebsiella pneumoniae NOT DETECTED NOT DETECTED Final   Proteus species NOT DETECTED NOT DETECTED Final   Salmonella species NOT DETECTED NOT DETECTED Final   Serratia marcescens NOT DETECTED NOT DETECTED Final   Haemophilus influenzae NOT DETECTED  NOT DETECTED Final   Neisseria meningitidis NOT DETECTED NOT DETECTED Final   Pseudomonas aeruginosa NOT  DETECTED NOT DETECTED Final   Stenotrophomonas maltophilia NOT DETECTED NOT DETECTED Final   Candida albicans NOT DETECTED NOT DETECTED Final   Candida auris NOT DETECTED NOT DETECTED Final   Candida glabrata NOT DETECTED NOT DETECTED Final   Candida krusei NOT DETECTED NOT DETECTED Final   Candida parapsilosis NOT DETECTED NOT DETECTED Final   Candida tropicalis NOT DETECTED NOT DETECTED Final   Cryptococcus neoformans/gattii NOT DETECTED NOT DETECTED Final   CTX-M ESBL NOT DETECTED NOT DETECTED Final   Carbapenem resistance IMP NOT DETECTED NOT DETECTED Final   Carbapenem resistance KPC NOT DETECTED NOT DETECTED Final   Carbapenem resistance NDM NOT DETECTED NOT DETECTED Final   Carbapenem resist OXA 48 LIKE NOT DETECTED NOT DETECTED Final   Carbapenem resistance VIM NOT DETECTED NOT DETECTED Final    Comment: Performed at [facility], [address], [place], [place] [address]         Radiology Studies: DG Chest 1 View  Result Date: 08/24/2020 CLINICAL DATA:  Sepsis EXAM: CHEST  1 VIEW COMPARISON:  12/21/2018 FINDINGS: The heart size and mediastinal contours are within normal limits. Both lungs are clear. The visualized skeletal structures are unremarkable. IMPRESSION: Normal study. Electronically Signed   By: Charlett Nose M.D.   On: 08/24/2020 22:57   CT Angio Chest PE W and/or Wo Contrast  Result Date: 08/25/2020 CLINICAL DATA:  Pulmonary embolism, mid and low back pain, vomiting, fever, hematuria EXAM: CT ANGIOGRAPHY CHEST CT ABDOMEN AND PELVIS WITH CONTRAST TECHNIQUE: Multidetector CT imaging of the chest was performed using the standard protocol during bolus administration of intravenous contrast. Multiplanar CT image reconstructions and MIPs were obtained to evaluate the vascular anatomy. Multidetector CT imaging of the abdomen and pelvis was performed using the standard protocol during bolus administration of intravenous contrast. CONTRAST:  OMNIPAQUE IOHEXOL  350 MG/ML SOLN COMPARISON:  None. FINDINGS: CTA CHEST FINDINGS Cardiovascular: There is suboptimal opacification of the pulmonary arterial tree with preferential opacification of the thoracic aorta. The examination is adequate only for exclusion of intraluminal filling defect within the main, right, left, and lobar pulmonary arteries, of which there are none. The segmental and subsegmental pulmonary arteries are not well assessed on this examination. Central pulmonary arteries are of normal caliber. Cardiac size within normal limits. No pericardial effusion. No significant coronary artery calcification. The thoracic aorta is unremarkable. Mediastinum/Nodes: No enlarged mediastinal, hilar, or axillary lymph nodes. Thyroid gland, trachea, and esophagus demonstrate no significant findings. Lungs/Pleura: 7 mm subpleural ground-glass pulmonary nodule within the right middle lobe, axial image # 38/6 is indeterminate. The lungs are otherwise clear. No pneumothorax or pleural effusion. No central obstructing lesion. Musculoskeletal: No acute bone abnormality. No lytic or blastic bone lesion. Review of the MIP images confirms the above findings. CT ABDOMEN and PELVIS FINDINGS Hepatobiliary: Moderate hepatic steatosis. No focal intrahepatic mass. No intra or extrahepatic biliary ductal dilation. Gallbladder unremarkable. Pancreas: Unremarkable Spleen: Unremarkable Adrenals/Urinary Tract: The adrenal glands are unremarkable. The kidneys are normal in position. There has developed asymmetric bilateral renal cortical scarring and asymmetric atrophy, left greater than right. No hydronephrosis. No intrarenal or ureteral calculi. The bladder is partially obscured, however, the visualized segment is unremarkable. Stomach/Bowel: Stomach is within normal limits. Appendix appears normal. No evidence of bowel wall thickening, distention, or inflammatory changes. No free intraperitoneal gas or fluid Vascular/Lymphatic: Mild aortoiliac  atherosclerotic calcification. No aortic aneurysm.  No pathologic adenopathy within the abdomen and pelvis. Reproductive: Uterus and bilateral adnexa are unremarkable. Other: Tiny fat containing umbilical hernia. The rectum is unremarkable. 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 No acute bone abnormality. Degenerative changes are seen within the lumbosacral junction. Review of the MIP images confirms the above findings. IMPRESSION: Technically limited examination with suboptimal opacification of the pulmonary arterial tree. No evidence of pulmonary embolism through the level of the lobar pulmonary arteries. The segmental and subsegmental pulmonary arteries are not well assessed. 7 mm indeterminate right middle lobe ground-glass pulmonary nodule. Initial follow-up with CT at 6-12 months is recommended to confirm persistence. If persistent, repeat CT is recommended every 2 years until 5 years of stability has been established. This recommendation follows the consensus statement: Guidelines for Management of Incidental Pulmonary Nodules Detected on CT Images: From the Fleischner Society 2017; Radiology 2017; 284:228-243. Moderate hepatic steatosis. Interval resolution of previously noted bilateral hydronephrosis and perinephric stranding. Interval development of asymmetric bilateral cortical scarring and atrophy, left greater than right. Aortic Atherosclerosis (ICD10-I70.0). Electronically Signed   By: Helyn Numbers MD   On: 08/25/2020 00:27   MR Cervical Spine W or Wo Contrast  Result Date: 08/25/2020 CLINICAL DATA:  Initial evaluation for epidural abscess. Fever with mid back pain, history of IVDU. EXAM: MRI CERVICAL, THORACIC AND LUMBAR SPINE WITHOUT AND WITH CONTRAST TECHNIQUE: Multiplanar and multiecho pulse sequences of the cervical spine, to include the craniocervical junction and cervicothoracic junction, and thoracic and lumbar spine, were obtained without and with intravenous  contrast. CONTRAST:  10mL GADAVIST GADOBUTROL 1 MMOL/ML IV SOLN COMPARISON:  None available. FINDINGS: MRI CERVICAL SPINE FINDINGS Alignment: Straightening of the normal cervical lordosis. No listhesis. Vertebrae: Vertebral body height maintained without acute or chronic fracture. Bone marrow signal intensity diffusely decreased on T1 weighted imaging, nonspecific, but most commonly related to anemia, smoking, or obesity. No discrete or worrisome osseous lesions. No abnormal marrow edema or enhancement. No evidence for osteomyelitis discitis or septic arthritis. Cord: Normal signal morphology. No epidural abscess or other collection. No abnormal enhancement. Posterior Fossa, vertebral arteries, paraspinal tissues: 6 mm retention cyst noted at the nasopharynx. Otherwise unremarkable. Disc levels: C5-6: Degenerative intervertebral disc space narrowing with diffuse disc osteophyte complex. Flattening and partial effacement of the ventral thecal sac with resultant mild spinal stenosis. Moderate bilateral C6 foraminal narrowing. No other significant disc pathology seen within the cervical spine. No other stenosis or impingement. MRI THORACIC SPINE FINDINGS Alignment: Physiologic with preservation of the normal thoracic kyphosis. No listhesis. Vertebrae: Vertebral body height maintained without acute or chronic fracture. Diffusely decreased T1 signal intensity seen throughout the visualized bone marrow. No worrisome osseous lesions. There is question of subtle paraspinous stranding and edema adjacent to the T8-9 interspace, best seen on axial sequences (series 19, image 26 and series 20, image 25, and series 21, images 26, 28). Suggestion of associated enhancement (series 23, image 27). Given the history of fever and mid back pain, changes are concerning for possible early/developing paraspinous infection. Subtle edema and enhancement seen within the adjacent endplates about the T8-9 interspace, which could reflect  developing osteomyelitis. No convincing evidence for acute discitis within the intervening T8-9 disc itself at this time. No convincing epidural extension or enhancement. Otherwise, no other convincing evidence for acute infection seen elsewhere within the thoracic spine. Cord: Normal signal and morphology. No epidural abscess or enhancement. Diffuse prominence of the dorsal epidural fat noted. Paraspinal and other soft tissues: Subtle paraspinous stranding, edema, and  enhancement adjacent to the T8-9 interspace as above. No discrete soft tissue collections. Trace layering bilateral pleural effusions, left slightly larger than right. Paraspinous soft tissues otherwise unremarkable. Disc levels: T8-9: Disc bulge with reactive endplate change and superimposed small left paracentral disc protrusion (series 20, image 26). Mild flattening of the left ventral cord without cord signal changes or significant spinal stenosis. T11-12: Disc bulge with reactive endplate spurring. Mild flattening of the ventral thecal sac. Superimposed mild facet hypertrophy without significant spinal stenosis. Foramina remain patent. Otherwise, no significant disc pathology seen elsewhere within the thoracic spine. No other significant canal or foraminal stenosis. No neural impingement. MRI LUMBAR SPINE FINDINGS Segmentation: Standard. Lowest well-formed disc space labeled the L5-S1 level. Alignment: Physiologic with preservation of the normal lumbar lordosis. No listhesis. Vertebrae: Vertebral body height maintained without acute or chronic fracture. Diffusely decreased T1 weighted signal seen throughout the visualized bone marrow. No discrete or worrisome osseous lesions. No abnormal marrow edema or enhancement. No evidence for osteomyelitis discitis or septic arthritis within the lumbar spine. Conus medullaris and cauda equina: Conus extends to the L1 level. Conus and cauda equina appear normal. Paraspinal and other soft tissues: Paraspinous  soft tissues demonstrate no acute finding. Asymmetric fatty atrophy noted involving the left psoas muscle. Multifocal cortical scarring noted about the left greater than right kidneys. Visualized visceral structures otherwise unremarkable. Disc levels: L1-2: Anterior endplate spurring without significant disc bulge. Mild facet hypertrophy. No stenosis or impingement. L2-3:  Negative interspace.  Mild facet hypertrophy.  No stenosis. L3-4:  Negative interspace.  Mild facet hypertrophy.  No stenosis. L4-5: Negative interspace. Mild facet hypertrophy with trace joint effusion on the right. No stenosis. L5-S1: Negative interspace. Moderate right worse than left facet hypertrophy with associated prominent joint effusion on the right. No significant surrounding inflammatory changes to suggest septic arthritis. Mild epidural lipomatosis. No stenosis or impingement. IMPRESSION: 1. Question subtle paraspinous stranding and edema adjacent to the T8-9 interspace as above. Given the history of fever and mid back pain, changes are concerning for possible early/developing paraspinous infection. Question early changes of osteomyelitis about the adjacent T8-9 interspace. No discrete or drainable fluid collections. 2. No other evidence for acute infection elsewhere within the cervical, thoracic, or lumbar spine. No epidural abscess. 3. 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 4. Small left paracentral disc protrusion at T8-9 with mild flattening of the left hemi cord. No significant spinal stenosis. 5. Moderate right worse than left facet hypertrophy at L5-S1 with associated prominent joint effusion on the right. No significant surrounding inflammatory changes to suggest septic arthritis. Finding could contribute to lower back pain. Electronically Signed   By: Rise Mu M.D.   On: 08/25/2020 05:12   MR THORACIC SPINE W WO CONTRAST  Result  Date: 08/25/2020 CLINICAL DATA:  Initial evaluation for epidural abscess. Fever with mid back pain, history of IVDU. EXAM: MRI CERVICAL, THORACIC AND LUMBAR SPINE WITHOUT AND WITH CONTRAST TECHNIQUE: Multiplanar and multiecho pulse sequences of the cervical spine, to include the craniocervical junction and cervicothoracic junction, and thoracic and lumbar spine, were obtained without and with intravenous contrast. CONTRAST:  10mL GADAVIST GADOBUTROL 1 MMOL/ML IV SOLN COMPARISON:  None available. FINDINGS: MRI CERVICAL SPINE FINDINGS Alignment: Straightening of the normal cervical lordosis. No listhesis. Vertebrae: Vertebral body height maintained without acute or chronic fracture. Bone marrow signal intensity diffusely decreased on T1 weighted imaging, nonspecific, but most commonly related to anemia, smoking, or obesity.  No discrete or worrisome osseous lesions. No abnormal marrow edema or enhancement. No evidence for osteomyelitis discitis or septic arthritis. Cord: Normal signal morphology. No epidural abscess or other collection. No abnormal enhancement. Posterior Fossa, vertebral arteries, paraspinal tissues: 6 mm retention cyst noted at the nasopharynx. Otherwise unremarkable. Disc levels: C5-6: Degenerative intervertebral disc space narrowing with diffuse disc osteophyte complex. Flattening and partial effacement of the ventral thecal sac with resultant mild spinal stenosis. Moderate bilateral C6 foraminal narrowing. No other significant disc pathology seen within the cervical spine. No other stenosis or impingement. MRI THORACIC SPINE FINDINGS Alignment: Physiologic with preservation of the normal thoracic kyphosis. No listhesis. Vertebrae: Vertebral body height maintained without acute or chronic fracture. Diffusely decreased T1 signal intensity seen throughout the visualized bone marrow. No worrisome osseous lesions. There is question of subtle paraspinous stranding and edema adjacent to the T8-9  interspace, best seen on axial sequences (series 19, image 26 and series 20, image 25, and series 21, images 26, 28). Suggestion of associated enhancement (series 23, image 27). Given the history of fever and mid back pain, changes are concerning for possible early/developing paraspinous infection. Subtle edema and enhancement seen within the adjacent endplates about the T8-9 interspace, which could reflect developing osteomyelitis. No convincing evidence for acute discitis within the intervening T8-9 disc itself at this time. No convincing epidural extension or enhancement. Otherwise, no other convincing evidence for acute infection seen elsewhere within the thoracic spine. Cord: Normal signal and morphology. No epidural abscess or enhancement. Diffuse prominence of the dorsal epidural fat noted. Paraspinal and other soft tissues: Subtle paraspinous stranding, edema, and enhancement adjacent to the T8-9 interspace as above. No discrete soft tissue collections. Trace layering bilateral pleural effusions, left slightly larger than right. Paraspinous soft tissues otherwise unremarkable. Disc levels: T8-9: Disc bulge with reactive endplate change and superimposed small left paracentral disc protrusion (series 20, image 26). Mild flattening of the left ventral cord without cord signal changes or significant spinal stenosis. T11-12: Disc bulge with reactive endplate spurring. Mild flattening of the ventral thecal sac. Superimposed mild facet hypertrophy without significant spinal stenosis. Foramina remain patent. Otherwise, no significant disc pathology seen elsewhere within the thoracic spine. No other significant canal or foraminal stenosis. No neural impingement. MRI LUMBAR SPINE FINDINGS Segmentation: Standard. Lowest well-formed disc space labeled the L5-S1 level. Alignment: Physiologic with preservation of the normal lumbar lordosis. No listhesis. Vertebrae: Vertebral body height maintained without acute or chronic  fracture. Diffusely decreased T1 weighted signal seen throughout the visualized bone marrow. No discrete or worrisome osseous lesions. No abnormal marrow edema or enhancement. No evidence for osteomyelitis discitis or septic arthritis within the lumbar spine. Conus medullaris and cauda equina: Conus extends to the L1 level. Conus and cauda equina appear normal. Paraspinal and other soft tissues: Paraspinous soft tissues demonstrate no acute finding. Asymmetric fatty atrophy noted involving the left psoas muscle. Multifocal cortical scarring noted about the left greater than right kidneys. Visualized visceral structures otherwise unremarkable. Disc levels: L1-2: Anterior endplate spurring without significant disc bulge. Mild facet hypertrophy. No stenosis or impingement. L2-3:  Negative interspace.  Mild facet hypertrophy.  No stenosis. L3-4:  Negative interspace.  Mild facet hypertrophy.  No stenosis. L4-5: Negative interspace. Mild facet hypertrophy with trace joint effusion on the right. No stenosis. L5-S1: Negative interspace. Moderate right worse than left facet hypertrophy with associated prominent joint effusion on the right. No significant surrounding inflammatory changes to suggest septic arthritis. Mild epidural lipomatosis. No stenosis or impingement. IMPRESSION:  1. Question subtle paraspinous stranding and edema adjacent to the T8-9 interspace as above. Given the history of fever and mid back pain, changes are concerning for possible early/developing paraspinous infection. Question early changes of osteomyelitis about the adjacent T8-9 interspace. No discrete or drainable fluid collections. 2. No other evidence for acute infection elsewhere within the cervical, thoracic, or lumbar spine. No epidural abscess. 3. 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 4. Small left paracentral disc protrusion at T8-9 with mild flattening of the  left hemi cord. No significant spinal stenosis. 5. Moderate right worse than left facet hypertrophy at L5-S1 with associated prominent joint effusion on the right. No significant surrounding inflammatory changes to suggest septic arthritis. Finding could contribute to lower back pain. Electronically Signed   By: Rise Mu M.D.   On: 08/25/2020 05:12   MR Lumbar Spine W Wo Contrast  Result Date: 08/25/2020 CLINICAL DATA:  Initial evaluation for epidural abscess. Fever with mid back pain, history of IVDU. EXAM: MRI CERVICAL, THORACIC AND LUMBAR SPINE WITHOUT AND WITH CONTRAST TECHNIQUE: Multiplanar and multiecho pulse sequences of the cervical spine, to include the craniocervical junction and cervicothoracic junction, and thoracic and lumbar spine, were obtained without and with intravenous contrast. CONTRAST:  10mL GADAVIST GADOBUTROL 1 MMOL/ML IV SOLN COMPARISON:  None available. FINDINGS: MRI CERVICAL SPINE FINDINGS Alignment: Straightening of the normal cervical lordosis. No listhesis. Vertebrae: Vertebral body height maintained without acute or chronic fracture. Bone marrow signal intensity diffusely decreased on T1 weighted imaging, nonspecific, but most commonly related to anemia, smoking, or obesity. No discrete or worrisome osseous lesions. No abnormal marrow edema or enhancement. No evidence for osteomyelitis discitis or septic arthritis. Cord: Normal signal morphology. No epidural abscess or other collection. No abnormal enhancement. Posterior Fossa, vertebral arteries, paraspinal tissues: 6 mm retention cyst noted at the nasopharynx. Otherwise unremarkable. Disc levels: C5-6: Degenerative intervertebral disc space narrowing with diffuse disc osteophyte complex. Flattening and partial effacement of the ventral thecal sac with resultant mild spinal stenosis. Moderate bilateral C6 foraminal narrowing. No other significant disc pathology seen within the cervical spine. No other stenosis or  impingement. MRI THORACIC SPINE FINDINGS Alignment: Physiologic with preservation of the normal thoracic kyphosis. No listhesis. Vertebrae: Vertebral body height maintained without acute or chronic fracture. Diffusely decreased T1 signal intensity seen throughout the visualized bone marrow. No worrisome osseous lesions. There is question of subtle paraspinous stranding and edema adjacent to the T8-9 interspace, best seen on axial sequences (series 19, image 26 and series 20, image 25, and series 21, images 26, 28). Suggestion of associated enhancement (series 23, image 27). Given the history of fever and mid back pain, changes are concerning for possible early/developing paraspinous infection. Subtle edema and enhancement seen within the adjacent endplates about the T8-9 interspace, which could reflect developing osteomyelitis. No convincing evidence for acute discitis within the intervening T8-9 disc itself at this time. No convincing epidural extension or enhancement. Otherwise, no other convincing evidence for acute infection seen elsewhere within the thoracic spine. Cord: Normal signal and morphology. No epidural abscess or enhancement. Diffuse prominence of the dorsal epidural fat noted. Paraspinal and other soft tissues: Subtle paraspinous stranding, edema, and enhancement adjacent to the T8-9 interspace as above. No discrete soft tissue collections. Trace layering bilateral pleural effusions, left slightly larger than right. Paraspinous soft tissues otherwise unremarkable. Disc levels: T8-9: Disc bulge with reactive endplate change and superimposed small left paracentral disc protrusion (series 20,  image 26). Mild flattening of the left ventral cord without cord signal changes or significant spinal stenosis. T11-12: Disc bulge with reactive endplate spurring. Mild flattening of the ventral thecal sac. Superimposed mild facet hypertrophy without significant spinal stenosis. Foramina remain patent. Otherwise,  no significant disc pathology seen elsewhere within the thoracic spine. No other significant canal or foraminal stenosis. No neural impingement. MRI LUMBAR SPINE FINDINGS Segmentation: Standard. Lowest well-formed disc space labeled the L5-S1 level. Alignment: Physiologic with preservation of the normal lumbar lordosis. No listhesis. Vertebrae: Vertebral body height maintained without acute or chronic fracture. Diffusely decreased T1 weighted signal seen throughout the visualized bone marrow. No discrete or worrisome osseous lesions. No abnormal marrow edema or enhancement. No evidence for osteomyelitis discitis or septic arthritis within the lumbar spine. Conus medullaris and cauda equina: Conus extends to the L1 level. Conus and cauda equina appear normal. Paraspinal and other soft tissues: Paraspinous soft tissues demonstrate no acute finding. Asymmetric fatty atrophy noted involving the left psoas muscle. Multifocal cortical scarring noted about the left greater than right kidneys. Visualized visceral structures otherwise unremarkable. Disc levels: L1-2: Anterior endplate spurring without significant disc bulge. Mild facet hypertrophy. No stenosis or impingement. L2-3:  Negative interspace.  Mild facet hypertrophy.  No stenosis. L3-4:  Negative interspace.  Mild facet hypertrophy.  No stenosis. L4-5: Negative interspace. Mild facet hypertrophy with trace joint effusion on the right. No stenosis. L5-S1: Negative interspace. Moderate right worse than left facet hypertrophy with associated prominent joint effusion on the right. No significant surrounding inflammatory changes to suggest septic arthritis. Mild epidural lipomatosis. No stenosis or impingement. IMPRESSION: 1. Question subtle paraspinous stranding and edema adjacent to the T8-9 interspace as above. Given the history of fever and mid back pain, changes are concerning for possible early/developing paraspinous infection. Question early changes of  osteomyelitis about the adjacent T8-9 interspace. No discrete or drainable fluid collections. 2. No other evidence for acute infection elsewhere within the cervical, thoracic, or lumbar spine. No epidural abscess. 3. 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 4. Small left paracentral disc protrusion at T8-9 with mild flattening of the left hemi cord. No significant spinal stenosis. 5. Moderate right worse than left facet hypertrophy at L5-S1 with associated prominent joint effusion on the right. No significant surrounding inflammatory changes to suggest septic arthritis. Finding could contribute to lower back pain. Electronically Signed   By: Rise Mu M.D.   On: 08/25/2020 05:12   CT ABDOMEN PELVIS W CONTRAST  Result Date: 08/25/2020 CLINICAL DATA:  Pulmonary embolism, mid and low back pain, vomiting, fever, hematuria EXAM: CT ANGIOGRAPHY CHEST CT ABDOMEN AND PELVIS WITH CONTRAST TECHNIQUE: Multidetector CT imaging of the chest was performed using the standard protocol during bolus administration of intravenous contrast. Multiplanar CT image reconstructions and MIPs were obtained to evaluate the vascular anatomy. Multidetector CT imaging of the abdomen and pelvis was performed using the standard protocol during bolus administration of intravenous contrast. CONTRAST:  OMNIPAQUE IOHEXOL 350 MG/ML SOLN COMPARISON:  None. FINDINGS: CTA CHEST FINDINGS Cardiovascular: There is suboptimal opacification of the pulmonary arterial tree with preferential opacification of the thoracic aorta. The examination is adequate only for exclusion of intraluminal filling defect within the main, right, left, and lobar pulmonary arteries, of which there are none. The segmental and subsegmental pulmonary arteries are not well assessed on this examination. Central pulmonary arteries are of normal caliber. Cardiac size within normal limits. No  pericardial effusion.  No significant coronary artery calcification. The thoracic aorta is unremarkable. Mediastinum/Nodes: No enlarged mediastinal, hilar, or axillary lymph nodes. Thyroid gland, trachea, and esophagus demonstrate no significant findings. Lungs/Pleura: 7 mm subpleural ground-glass pulmonary nodule within the right middle lobe, axial image # 38/6 is indeterminate. The lungs are otherwise clear. No pneumothorax or pleural effusion. No central obstructing lesion. Musculoskeletal: No acute bone abnormality. No lytic or blastic bone lesion. Review of the MIP images confirms the above findings. CT ABDOMEN and PELVIS FINDINGS Hepatobiliary: Moderate hepatic steatosis. No focal intrahepatic mass. No intra or extrahepatic biliary ductal dilation. Gallbladder unremarkable. Pancreas: Unremarkable Spleen: Unremarkable Adrenals/Urinary Tract: The adrenal glands are unremarkable. The kidneys are normal in position. There has developed asymmetric bilateral renal cortical scarring and asymmetric atrophy, left greater than right. No hydronephrosis. No intrarenal or ureteral calculi. The bladder is partially obscured, however, the visualized segment is unremarkable. Stomach/Bowel: Stomach is within normal limits. Appendix appears normal. No evidence of bowel wall thickening, distention, or inflammatory changes. No free intraperitoneal gas or fluid Vascular/Lymphatic: Mild aortoiliac atherosclerotic calcification. No aortic aneurysm. No pathologic adenopathy within the abdomen and pelvis. Reproductive: Uterus and bilateral adnexa are unremarkable. Other: Tiny fat containing umbilical hernia. The rectum is unremarkable. 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 No acute bone abnormality. Degenerative changes are seen within the lumbosacral junction. Review of the MIP images confirms the above findings. IMPRESSION: Technically limited examination with suboptimal opacification of the pulmonary  arterial tree. No evidence of pulmonary embolism through the level of the lobar pulmonary arteries. The segmental and subsegmental pulmonary arteries are not well assessed. 7 mm indeterminate right middle lobe ground-glass pulmonary nodule. Initial follow-up with CT at 6-12 months is recommended to confirm persistence. If persistent, repeat CT is recommended every 2 years until 5 years of stability has been established. This recommendation follows the consensus statement: Guidelines for Management of Incidental Pulmonary Nodules Detected on CT Images: From the Fleischner Society 2017; Radiology 2017; 284:228-243. Moderate hepatic steatosis. Interval resolution of previously noted bilateral hydronephrosis and perinephric stranding. Interval development of asymmetric bilateral cortical scarring and atrophy, left greater than right. Aortic Atherosclerosis (ICD10-I70.0). Electronically Signed   By: Helyn Numbers MD   On: 08/25/2020 00:27        Scheduled Meds:  folic acid  1 mg Oral Daily   heparin  5,000 Units Subcutaneous Q8H   LORazepam  0-4 mg Intravenous Q6H   Followed by   Melene Muller ON 08/27/2020] LORazepam  0-4 mg Intravenous Q12H   multivitamin with minerals  1 tablet Oral Daily   nicotine  21 mg Transdermal Daily   thiamine  100 mg Oral Daily   Or   thiamine  100 mg Intravenous Daily   Continuous Infusions:  sodium chloride Stopped (08/25/20 1923)   ceFEPime (MAXIPIME) IV 2 g (08/26/20 0853)   magnesium sulfate bolus IVPB     vancomycin 1,500 mg (08/25/20 2130)     LOS: 1 day    Time spent: 30 minutes    Dorcas Carrow, MD [facility] Pager [phone]

## 2020-08-27 DIAGNOSIS — M4624 Osteomyelitis of vertebra, thoracic region: Secondary | ICD-10-CM | POA: Diagnosis not present

## 2020-08-27 LAB — CBC WITH DIFFERENTIAL/PLATELET
Abs Immature Granulocytes: 0.4 10*3/uL — ABNORMAL HIGH (ref 0.00–0.07)
Basophils Absolute: 0.1 10*3/uL (ref 0.0–0.1)
Basophils Relative: 1 %
Eosinophils Absolute: 0 10*3/uL (ref 0.0–0.5)
Eosinophils Relative: 0 %
HCT: 32.1 % — ABNORMAL LOW (ref 36.0–46.0)
Hemoglobin: 9.8 g/dL — ABNORMAL LOW (ref 12.0–15.0)
Immature Granulocytes: 5 %
Lymphocytes Relative: 26 %
Lymphs Abs: 2.3 10*3/uL (ref 0.7–4.0)
MCH: 22.5 pg — ABNORMAL LOW (ref 26.0–34.0)
MCHC: 30.5 g/dL (ref 30.0–36.0)
MCV: 73.8 fL — ABNORMAL LOW (ref 80.0–100.0)
Monocytes Absolute: 0.5 10*3/uL (ref 0.1–1.0)
Monocytes Relative: 6 %
Neutro Abs: 5.5 10*3/uL (ref 1.7–7.7)
Neutrophils Relative %: 62 %
Platelets: 230 10*3/uL (ref 150–400)
RBC: 4.35 MIL/uL (ref 3.87–5.11)
RDW: 22 % — ABNORMAL HIGH (ref 11.5–15.5)
WBC: 8.7 10*3/uL (ref 4.0–10.5)
nRBC: 0 % (ref 0.0–0.2)

## 2020-08-27 LAB — CULTURE, BLOOD (ROUTINE X 2)
Special Requests: ADEQUATE
Special Requests: ADEQUATE

## 2020-08-27 LAB — BASIC METABOLIC PANEL
Anion gap: 5 (ref 5–15)
BUN: 7 mg/dL (ref 6–20)
CO2: 24 mmol/L (ref 22–32)
Calcium: 8.2 mg/dL — ABNORMAL LOW (ref 8.9–10.3)
Chloride: 110 mmol/L (ref 98–111)
Creatinine, Ser: 0.67 mg/dL (ref 0.44–1.00)
GFR, Estimated: >60 mL/min (ref >60)
Glucose, Bld: 81 mg/dL (ref 70–99)
Potassium: 4.1 mmol/L (ref 3.5–5.1)
Sodium: 139 mmol/L (ref 135–145)

## 2020-08-27 LAB — URINE CULTURE: Culture: >=100000 — AB

## 2020-08-27 LAB — MAGNESIUM: Magnesium: 2.1 mg/dL (ref 1.7–2.4)

## 2020-08-27 LAB — PHOSPHORUS: Phosphorus: 3.2 mg/dL (ref 2.5–4.6)

## 2020-08-27 MED ORDER — ONDANSETRON 4 MG PO TBDP: 4.0000 mg | ORAL_TABLET | Freq: Four times a day (QID) | ORAL | Status: DC | PRN

## 2020-08-27 MED ORDER — HYDROXYZINE HCL 25 MG PO TABS
25.0000 mg | ORAL_TABLET | Freq: Four times a day (QID) | ORAL | Status: DC | PRN
  Administered 2020-08-27 – 2020-08-29 (×5): 25 mg via ORAL
  Filled 2020-08-27 (×6): qty 1

## 2020-08-27 MED ORDER — METHOCARBAMOL 500 MG PO TABS
500.0000 mg | ORAL_TABLET | Freq: Three times a day (TID) | ORAL | Status: DC | PRN
  Administered 2020-08-27 – 2020-08-29 (×5): 500 mg via ORAL
  Filled 2020-08-27 (×7): qty 1

## 2020-08-27 MED ORDER — LOPERAMIDE HCL 2 MG PO CAPS
2.0000 mg | ORAL_CAPSULE | ORAL | Status: DC | PRN
  Administered 2020-08-27 (×2): 2 mg via ORAL
  Filled 2020-08-27 (×2): qty 1

## 2020-08-27 MED ORDER — OXYCODONE HCL 5 MG PO TABS
5.0000 mg | ORAL_TABLET | Freq: Once | ORAL | Status: AC
  Administered 2020-08-27: 5 mg via ORAL
  Filled 2020-08-27: qty 1

## 2020-08-27 MED ORDER — CLONIDINE HCL 0.1 MG PO TABS
0.1000 mg | ORAL_TABLET | ORAL | Status: DC
  Administered 2020-08-29: 0.1 mg via ORAL
  Filled 2020-08-27: qty 1

## 2020-08-27 MED ORDER — CLONIDINE HCL 0.1 MG PO TABS
0.1000 mg | ORAL_TABLET | Freq: Four times a day (QID) | ORAL | Status: AC
  Administered 2020-08-27 – 2020-08-28 (×8): 0.1 mg via ORAL
  Filled 2020-08-27 (×8): qty 1

## 2020-08-27 MED ORDER — DICYCLOMINE HCL 20 MG PO TABS
20.0000 mg | ORAL_TABLET | Freq: Four times a day (QID) | ORAL | Status: DC | PRN
  Administered 2020-08-27 – 2020-08-29 (×4): 20 mg via ORAL
  Filled 2020-08-27 (×6): qty 1

## 2020-08-27 MED ORDER — NAPROXEN 500 MG PO TABS
500.0000 mg | ORAL_TABLET | Freq: Two times a day (BID) | ORAL | Status: DC | PRN
  Administered 2020-08-27 – 2020-08-29 (×5): 500 mg via ORAL
  Filled 2020-08-27 (×7): qty 1

## 2020-08-27 MED ORDER — CLONIDINE HCL 0.1 MG PO TABS: 0.1000 mg | ORAL_TABLET | Freq: Every day | ORAL | Status: DC

## 2020-08-27 NOTE — Progress Notes (Addendum)
Pharmacy Antibiotic Note  Brenda [name] is a 50 y.o. female admitted on 08/24/2020 with  back pain, found to have paraspinous infection, possible discitis . Patient has history of polysubstance and IVDU. Pharmacy has been consulted for cefepime dosing.   Blood cultures growing 4 out of 4 gram negative bacteremia. BCID Enterobacterales, then confirmed E. Coli. Blood cultures growing Citrobacter. Vancomycin discontinued @730AM , and then briefly restarted @7 /30 PM. Patient has now been de-escalated to cefepime alone. Infectious disease following. Renal function has been stable, WBC WNL, and patient continues to remain afebrile.  Plan: Continue cefepime 2 g IV every 8 hours . Monitor Scr, WBC and temperature.  Height: 5\' 2"  (157.5 cm) Weight: 111.9 kg (246 lb 11.1 oz) IBW/kg (Calculated) : 50.1  Temp (24hrs), Avg:98.3 F (36.8 C), Min:97.9 F (36.6 C), Max:98.9 F (37.2 C)  Recent Labs  Lab 08/24/20 2227 08/25/20 0319 08/26/20 0549 08/27/20 0454  WBC 11.1*  --  8.4 8.7  CREATININE 0.93  --  0.69 0.67  LATICACIDVEN 2.3* 0.8  --   --     Estimated Creatinine Clearance: 101.6 mL/min (by C-G formula based on SCr of 0.67 mg/dL).    Allergies  Allergen Reactions   Flexeril [Cyclobenzaprine] Other (See Comments)    RLS-like symptoms    Seroquel [Quetiapine Fumarate] Other (See Comments)    RLS-like symptoms    Antimicrobials this admission: 7/29 cefepime >>  7/29 vancomycin >> 08/27/2020  Dose adjustments this admission: none  Microbiology results: 7/29 BCx: 4/4 bottles GNR. BCID detected E. Coli. Blood culture growing Citrobacter freundii (resistant to cefazolin) 7/29 UCx: E. coli  7/31 BCx: in progress (Ngx2d)  Thank you for allowing pharmacy to be a part of this patient's care.   8/29, PharmD Pharmacy Resident  08/27/2020 9:43 AM

## 2020-08-27 NOTE — Progress Notes (Signed)
PROGRESS NOTE    Brenda [name]  WUJ:[id number] DOB: 29-Jul-1970 DOA: 08/24/2020 PCP: Rolm Gala, NP    Brief Narrative:  50 year old female with history of IV drug use ongoing cocaine, heroin, alcohol and a smoker, hepatitis C, hypertension asthma GERD and depression bipolar disorder presented to the emergency room with back pain, nausea vomiting and dysuria for about 4 days. At the emergency room she was found to be bacteremic with immediate blood cultures growing 4 out of 4 gram-negative rods, WBC 11.1, negative COVID-19.  Potassium and magnesium were low.  MRI of the cervical thoracic and lumbar spine showed paraspinous stranding and edema adjacent to T8 and 9 interspace.  Admitted with broad-spectrum antibiotics.  CT of the chest and abdomen pelvis without any acute findings.     Assessment & Plan:   Principal Problem:   Osteomyelitis of thoracic spine (HCC) Active Problems:   Alcohol abuse   Tobacco use disorder   Sepsis (HCC)   History of asthma   Back pain   Polysubstance abuse (HCC)   IVDU (intravenous drug user)   HTN (hypertension)   Depression with anxiety   Nausea & vomiting   Hypokalemia   Hypomagnesemia   Lung nodule   UTI (urinary tract infection)   Cellulitis of right hand   Bacteremia due to Gram-negative bacteria  Sepsis due to gram-negative bacteremia, suspected osteomyelitis of thoracic spine, suspected UTI: Blood cultures 7/29 positive for E. coli as well as Citrobacter. Blood culture 7/31 pending, no growth so far Urine cultures positive for E. coli. IR evaluated for possible percutaneous biopsy, not done due to chances of low yield. Followed by infectious disease.  Remains on cefepime. 2D echocardiogram done today.  No evidence of vegetation. E. coli UTI, E. coli and Citrobacter bacteremia.  Bone space infection is likely due to Citrobacter.  ID following.  Polysubstance abuse with IV drug use, smoker and alcohol use: NSAIDs.  CIWA  protocol with benzodiazepines.  Nicotine patch.  Hypokalemia and hypomagnesemia: Replaced aggressively.  Improved.  Opiate withdrawals: Patient today with diarrhea and abdominal cramping.  Will start patient on clonidine opiate withdrawal protocol with clonidine, baclofen, NSAIDs and Imodium.   DVT prophylaxis: heparin injection 5,000 Units Start: 08/25/20 1400   Code Status: Full code Family Communication: None Disposition Plan: Status is: Inpatient  Remains inpatient appropriate because:Inpatient level of care appropriate due to severity of illness  Dispo: The patient is from: Home              Anticipated d/c is to: Home              Patient currently is not medically stable to d/c.   Difficult to place patient No         Consultants:  Infectious disease  Procedures:  None  Antimicrobials:  Antibiotics Given (last 72 hours)     Date/Time Action Medication Dose Rate   08/24/20 2345 New Bag/Given   vancomycin (VANCOCIN) IVPB 1000 mg/200 mL premix 1,000 mg 200 mL/hr   08/24/20 2346 New Bag/Given   ceFEPIme (MAXIPIME) 2 g in sodium chloride 0.9 % 100 mL IVPB 2 g 200 mL/hr   08/25/20 0328 New Bag/Given   Vancomycin (VANCOCIN) 1,500 mg in sodium chloride 0.9 % 500 mL IVPB 1,500 mg 250 mL/hr   08/25/20 0957 New Bag/Given   ceFEPIme (MAXIPIME) 2 g in sodium chloride 0.9 % 100 mL IVPB 2 g 200 mL/hr   08/25/20 1709 New Bag/Given   ceFEPIme (MAXIPIME) 2 g  in sodium chloride 0.9 % 100 mL IVPB 2 g 200 mL/hr   08/25/20 2130 New Bag/Given   vancomycin (VANCOREADY) IVPB 1500 mg/300 mL 1,500 mg 150 mL/hr   08/26/20 0246 New Bag/Given   ceFEPIme (MAXIPIME) 2 g in sodium chloride 0.9 % 100 mL IVPB 2 g 200 mL/hr   08/26/20 0853 New Bag/Given   ceFEPIme (MAXIPIME) 2 g in sodium chloride 0.9 % 100 mL IVPB 2 g 200 mL/hr   08/26/20 1713 New Bag/Given   ceFEPIme (MAXIPIME) 2 g in sodium chloride 0.9 % 100 mL IVPB 2 g 200 mL/hr   08/26/20 2043 New Bag/Given   vancomycin (VANCOREADY)  IVPB 1500 mg/300 mL 1,500 mg 150 mL/hr   08/27/20 0256 New Bag/Given   ceFEPIme (MAXIPIME) 2 g in sodium chloride 0.9 % 100 mL IVPB 2 g 200 mL/hr   08/27/20 0907 New Bag/Given   ceFEPIme (MAXIPIME) 2 g in sodium chloride 0.9 % 100 mL IVPB 2 g 200 mL/hr          Subjective: Patient seen and examined.  Afebrile.  She does have ongoing back pain and spasm.  Denies any nausea vomiting or abdominal pain.  Walking to the bathroom.  No new neurological deficit.  Objective: Vitals:   08/26/20 1536 08/26/20 1945 08/27/20 0809 08/27/20 1000  BP: (!) 143/86 140/85 (!) 158/92   Pulse: 87 85 81   Resp: 20 20 (!) 28 20  Temp: 97.9 F (36.6 C) 98 F (36.7 C) 98.9 F (37.2 C)   TempSrc:   Oral   SpO2: 100% 100% 97%   Weight:      Height:        Intake/Output Summary (Last 24 hours) at 08/27/2020 1345 Last data filed at 08/27/2020 1007 Gross per 24 hour  Intake 3095.36 ml  Output --  Net 3095.36 ml   Filed Weights   08/24/20 2213 08/25/20 0651  Weight: 113.4 kg 111.9 kg    Examination:  General exam: Appears calm and comfortable at rest. Anxious and in mild distress on attempted mobilization due to back pain. Able to walk to the bathroom and come back. Respiratory system: Clear to auscultation. Respiratory effort normal. Cardiovascular system: S1 & S2 heard, RRR.  Gastrointestinal system: Abdomen is nondistended, soft and nontender. No organomegaly or masses felt. Normal bowel sounds heard. Central nervous system: Alert and oriented. No focal neurological deficits. She has diffuse muscular tenderness along the back, no point tenderness. Extremities: Symmetric 5 x 5 power. Skin: No rashes, lesions or ulcers  Data Reviewed: I have personally reviewed following labs and imaging studies  CBC: Recent Labs  Lab 08/24/20 2227 08/26/20 0549 08/27/20 0454  WBC 11.1* 8.4 8.7  NEUTROABS 9.6*  --  5.5  HGB 12.3 9.9* 9.8*  HCT 39.2 33.3* 32.1*  MCV 73.1* 74.0* 73.8*  PLT 179 184 230    Basic Metabolic Panel: Recent Labs  Lab 08/24/20 2227 08/25/20 0319 08/26/20 0549 08/27/20 0454  NA 134*  --  138 139  K 3.0*  --  3.7 4.1  CL 96*  --  106 110  CO2 26  --  26 24  GLUCOSE 156*  --  89 81  BUN 7  --  8 7  CREATININE 0.93  --  0.69 0.67  CALCIUM 9.3  --  7.8* 8.2*  MG 1.2*  --   --  2.1  PHOS  --  2.6  --  3.2   GFR: Estimated Creatinine Clearance: 101.6 mL/min (by  C-G formula based on SCr of 0.67 mg/dL). Liver Function Tests: Recent Labs  Lab 08/24/20 2227  AST 36  ALT 58*  ALKPHOS 93  BILITOT 1.0  PROT 7.5  ALBUMIN 3.0*   Recent Labs  Lab 08/25/20 0319  LIPASE 24   No results for input(s): AMMONIA in the last 168 hours. Coagulation Profile: Recent Labs  Lab 08/24/20 2227  INR 1.1   Cardiac Enzymes: No results for input(s): CKTOTAL, CKMB, CKMBINDEX, TROPONINI in the last 168 hours. BNP (last 3 results) No results for input(s): PROBNP in the last 8760 hours. HbA1C: No results for input(s): HGBA1C in the last 72 hours. CBG: No results for input(s): GLUCAP in the last 168 hours. Lipid Profile: No results for input(s): CHOL, HDL, LDLCALC, TRIG, CHOLHDL, LDLDIRECT in the last 72 hours. Thyroid Function Tests: No results for input(s): TSH, T4TOTAL, FREET4, T3FREE, THYROIDAB in the last 72 hours. Anemia Panel: No results for input(s): VITAMINB12, FOLATE, FERRITIN, TIBC, IRON, RETICCTPCT in the last 72 hours. Sepsis Labs: Recent Labs  Lab 08/24/20 2227 08/25/20 0319 08/26/20 0549  PROCALCITON 5.58 8.50 6.54  LATICACIDVEN 2.3* 0.8  --     Recent Results (from the past 240 hour(s))  Culture, blood (Routine x 2)     Status: Abnormal   Collection Time: 08/24/20 10:27 PM   Specimen: BLOOD  Result Value Ref Range Status   Specimen Description   Final    BLOOD LEFT ANTECUBITAL Performed at [facility], [address] [place], [place] [address]    Special Requests   Final    BOTTLES DRAWN AEROBIC AND ANAEROBIC Blood Culture  adequate volume Performed at [facility], [address] [place], [place] [address]    Culture  Setup Time   Final    GRAM NEGATIVE RODS IN BOTH AEROBIC AND ANAEROBIC BOTTLES CRITICAL VALUE NOTED.  VALUE IS CONSISTENT WITH PREVIOUSLY REPORTED AND CALLED VALUE. Performed at [facility], [address] [place], [place] [address]    Culture (A)  Final    CITROBACTER FREUNDII SUSCEPTIBILITIES PERFORMED ON PREVIOUS CULTURE WITHIN THE LAST 5 DAYS. Performed at [facility], [address] [place], [place] [address]    Report Status 08/27/2020 FINAL  Final  Culture, blood (Routine x 2)     Status: Abnormal   Collection Time: 08/24/20 10:27 PM   Specimen: BLOOD  Result Value Ref Range Status   Specimen Description   Final    BLOOD RIGHT ANTECUBITAL Performed at [facility], [address] [place], [place] [address]    Special Requests   Final    BOTTLES DRAWN AEROBIC AND ANAEROBIC Blood Culture adequate volume Performed at [facility], [address] [place], [place] [address]    Culture  Setup Time   Final    GRAM NEGATIVE RODS IN BOTH AEROBIC AND ANAEROBIC BOTTLES CRITICAL RESULT CALLED TO, READ BACK BY AND VERIFIED WITH: Algie CofferSUSAN WATSON, PHARMD AT 1119 ON 08/25/20 BY GM Performed at [facility], [address] [place], [place] [address]    Culture CITROBACTER FREUNDII (A)  Final   Report Status 08/27/2020 FINAL  Final   Organism ID, Bacteria CITROBACTER FREUNDII  Final      Susceptibility   Citrobacter freundii - MIC*    CEFAZOLIN >=64 RESISTANT Resistant     CEFEPIME <=0.12 SENSITIVE Sensitive     CEFTAZIDIME <=1 SENSITIVE Sensitive     CEFTRIAXONE <=0.25 SENSITIVE Sensitive     CIPROFLOXACIN <=0.25  SENSITIVE Sensitive     GENTAMICIN <=1 SENSITIVE Sensitive     IMIPENEM 1 SENSITIVE Sensitive     TRIMETH/SULFA <=20 SENSITIVE Sensitive     PIP/TAZO <=4 SENSITIVE Sensitive     * CITROBACTER FREUNDII  Resp Panel by  RT-PCR (Flu A&B, Covid) Nasopharyngeal Swab     Status: None   Collection Time: 08/24/20 10:27 PM   Specimen: Nasopharyngeal Swab; Nasopharyngeal(NP) swabs in vial transport medium  Result Value Ref Range Status   SARS Coronavirus 2 by RT PCR NEGATIVE NEGATIVE Final    Comment: (NOTE) SARS-CoV-2 target nucleic acids are NOT DETECTED.  The SARS-CoV-2 RNA is generally detectable in upper respiratory specimens during the acute phase of infection. The lowest concentration of SARS-CoV-2 viral copies this assay can detect is 138 copies/mL. A negative result does not preclude SARS-Cov-2 infection and should not be used as the sole basis for treatment or other patient management decisions. A negative result may occur with  improper specimen collection/handling, submission of specimen other than nasopharyngeal swab, presence of viral mutation(s) within the areas targeted by this assay, and inadequate number of viral copies(<138 copies/mL). A negative result must be combined with clinical observations, patient history, and epidemiological information. The expected result is Negative.  Fact Sheet for Patients:  BloggerCourse.com  Fact Sheet for Healthcare Providers:  SeriousBroker.it  This test is no t yet approved or cleared by the [place] FDA and  has been authorized for detection and/or diagnosis of SARS-CoV-2 by FDA under an Emergency Use Authorization (EUA). This EUA will remain  in effect (meaning this test can be used) for the duration of the COVID-19 declaration under Section 564(b)(1) of the Act, 21 U.S.C.section 360bbb-3(b)(1), unless the authorization is terminated  or revoked sooner.       Influenza A by PCR NEGATIVE NEGATIVE Final   Influenza B by PCR NEGATIVE NEGATIVE Final    Comment: (NOTE) The Xpert Xpress SARS-CoV-2/FLU/RSV plus assay is intended as an aid in the diagnosis of influenza from Nasopharyngeal swab  specimens and should not be used as a sole basis for treatment. Nasal washings and aspirates are unacceptable for Xpert Xpress SARS-CoV-2/FLU/RSV testing.  Fact Sheet for Patients: BloggerCourse.com  Fact Sheet for Healthcare Providers: SeriousBroker.it  This test is not yet approved or cleared by the [place] FDA and has been authorized for detection and/or diagnosis of SARS-CoV-2 by FDA under an Emergency Use Authorization (EUA). This EUA will remain in effect (meaning this test can be used) for the duration of the COVID-19 declaration under Section 564(b)(1) of the Act, 21 U.S.C. section 360bbb-3(b)(1), unless the authorization is terminated or revoked.  Performed at [facility], [address], [place], [place] [address]   Urine Culture     Status: Abnormal   Collection Time: 08/24/20 10:27 PM   Specimen: Urine, Clean Catch  Result Value Ref Range Status   Specimen Description   Final    URINE, CLEAN CATCH Performed at [facility], [address] [place], [place] [address]    Special Requests   Final    NONE Performed at [facility], [address] [place], [place] [address]    Culture >=100,000 COLONIES/mL ESCHERICHIA COLI (A)  Final   Report Status 08/27/2020 FINAL  Final   Organism ID, Bacteria ESCHERICHIA COLI (A)  Final      Susceptibility   Escherichia coli - MIC*    AMPICILLIN <=2 SENSITIVE Sensitive     CEFAZOLIN <=4 SENSITIVE Sensitive  CEFEPIME <=0.12 SENSITIVE Sensitive     CEFTRIAXONE <=0.25 SENSITIVE Sensitive     CIPROFLOXACIN <=0.25 SENSITIVE Sensitive     GENTAMICIN <=1 SENSITIVE Sensitive     IMIPENEM <=0.25 SENSITIVE Sensitive     NITROFURANTOIN <=16 SENSITIVE Sensitive     TRIMETH/SULFA <=20 SENSITIVE Sensitive     AMPICILLIN/SULBACTAM <=2 SENSITIVE Sensitive     PIP/TAZO <=4 SENSITIVE Sensitive     * >=100,000 COLONIES/mL ESCHERICHIA COLI  Blood  Culture ID Panel (Reflexed)     Status: Abnormal   Collection Time: 08/24/20 10:27 PM  Result Value Ref Range Status   Enterococcus faecalis NOT DETECTED NOT DETECTED Final   Enterococcus Faecium NOT DETECTED NOT DETECTED Final   Listeria monocytogenes NOT DETECTED NOT DETECTED Final   Staphylococcus species NOT DETECTED NOT DETECTED Final   Staphylococcus aureus (BCID) NOT DETECTED NOT DETECTED Final   Staphylococcus epidermidis NOT DETECTED NOT DETECTED Final   Staphylococcus lugdunensis NOT DETECTED NOT DETECTED Final   Streptococcus species NOT DETECTED NOT DETECTED Final   Streptococcus agalactiae NOT DETECTED NOT DETECTED Final   Streptococcus pneumoniae NOT DETECTED NOT DETECTED Final   Streptococcus pyogenes NOT DETECTED NOT DETECTED Final   A.calcoaceticus-baumannii NOT DETECTED NOT DETECTED Final   Bacteroides fragilis NOT DETECTED NOT DETECTED Final   Enterobacterales DETECTED (A) NOT DETECTED Final    Comment: Enterobacterales represent a large order of gram negative bacteria, not a single organism. Refer to culture for further identification. CRITICAL RESULT CALLED TO, READ BACK BY AND VERIFIED WITH: SUSAN WATSON, PHARMD AT 1119 ON 08/25/20 BY GM    Enterobacter cloacae complex NOT DETECTED NOT DETECTED Final   Escherichia coli NOT DETECTED NOT DETECTED Final   Klebsiella aerogenes NOT DETECTED NOT DETECTED Final   Klebsiella oxytoca NOT DETECTED NOT DETECTED Final   Klebsiella pneumoniae NOT DETECTED NOT DETECTED Final   Proteus species NOT DETECTED NOT DETECTED Final   Salmonella species NOT DETECTED NOT DETECTED Final   Serratia marcescens NOT DETECTED NOT DETECTED Final   Haemophilus influenzae NOT DETECTED NOT DETECTED Final   Neisseria meningitidis NOT DETECTED NOT DETECTED Final   Pseudomonas aeruginosa NOT DETECTED NOT DETECTED Final   Stenotrophomonas maltophilia NOT DETECTED NOT DETECTED Final   Candida albicans NOT DETECTED NOT DETECTED Final   Candida auris  NOT DETECTED NOT DETECTED Final   Candida glabrata NOT DETECTED NOT DETECTED Final   Candida krusei NOT DETECTED NOT DETECTED Final   Candida parapsilosis NOT DETECTED NOT DETECTED Final   Candida tropicalis NOT DETECTED NOT DETECTED Final   Cryptococcus neoformans/gattii NOT DETECTED NOT DETECTED Final   CTX-M ESBL NOT DETECTED NOT DETECTED Final   Carbapenem resistance IMP NOT DETECTED NOT DETECTED Final   Carbapenem resistance KPC NOT DETECTED NOT DETECTED Final   Carbapenem resistance NDM NOT DETECTED NOT DETECTED Final   Carbapenem resist OXA 48 LIKE NOT DETECTED NOT DETECTED Final   Carbapenem resistance VIM NOT DETECTED NOT DETECTED Final    Comment: Performed at [facility], [address] [place], [place] [address]  CULTURE, BLOOD (ROUTINE X 2) w Reflex to ID Panel     Status: None (Preliminary result)   Collection Time: 08/26/20  5:49 AM   Specimen: BLOOD  Result Value Ref Range Status   Specimen Description BLOOD BLOOD RIGHT HAND  Final   Special Requests IN PEDIATRIC BOTTLE Blood Culture adequate volume  Final   Culture   Final    NO GROWTH 1 DAY Performed at [facility], [address] [place], [place] [address]    Report Status PENDING  Incomplete  CULTURE, BLOOD (ROUTINE X 2) w Reflex to ID Panel     Status: None (Preliminary result)   Collection Time: 08/26/20  5:49 AM   Specimen: BLOOD  Result Value Ref Range Status   Specimen Description BLOOD BLOOD LEFT HAND  Final   Special Requests   Final    BOTTLES DRAWN AEROBIC AND ANAEROBIC Blood Culture adequate volume   Culture   Final    NO GROWTH 1 DAY Performed at [facility], [address], [place], [place] [address]    Report Status PENDING  Incomplete         Radiology Studies: ECHOCARDIOGRAM COMPLETE  Result Date: 08/26/2020    ECHOCARDIOGRAM REPORT   Patient Name:   Brenda [name] Date of Exam: 08/26/2020 Medical Rec #:  [id number]          Height:       62.0 in  Accession #:    [id number]         Weight:       246.7 lb Date of Birth:  Apr 17, 1970          BSA:          2.090 m Patient Age:    50 years           BP:           149/84 mmHg Patient Gender: F                  HR:           85 bpm. Exam Location:  [facility] Procedure: 2D Echo, Color Doppler and Cardiac Doppler Indications:     Bacteremia R78.81  History:         Patient has no prior history of Echocardiogram examinations.                  Angina, Signs/Symptoms:Shortness of Breath; Risk                  Factors:Hypertension.  Sonographer:     Neysa Bonito Roar Referring Phys:  [id number] Diagnosing Phys: Julien Nordmann MD IMPRESSIONS  1. Left ventricular ejection fraction, by estimation, is 60 to 65%. The left ventricle has normal function. The left ventricle has no regional wall motion abnormalities. Left ventricular diastolic parameters are consistent with Grade II diastolic dysfunction (pseudonormalization).  2. Right ventricular systolic function is normal. The right ventricular size is normal. Tricuspid regurgitation signal is inadequate for assessing PA pressure.  3. Left atrial size was mildly dilated.  4. The mitral valve is normal in structure. Mild mitral valve regurgitation.  5. No valve vegetation noted. FINDINGS  Left Ventricle: Left ventricular ejection fraction, by estimation, is 60 to 65%. The left ventricle has normal function. The left ventricle has no regional wall motion abnormalities. The left ventricular internal cavity size was normal in size. There is  no left ventricular hypertrophy. Left ventricular diastolic parameters are consistent with Grade II diastolic dysfunction (pseudonormalization). Right Ventricle: The right ventricular size is normal. No increase in right ventricular wall thickness. Right ventricular systolic function is normal. Tricuspid regurgitation signal is inadequate for assessing PA pressure. Left Atrium: Left atrial size was mildly dilated. Right Atrium: Right atrial  size was normal in size. Pericardium: There is no evidence of pericardial effusion. Mitral Valve: The mitral valve is normal in structure. Mild mitral valve regurgitation. No evidence of  mitral valve stenosis. Tricuspid Valve: The tricuspid valve is normal in structure. Tricuspid valve regurgitation is mild . No evidence of tricuspid stenosis. Aortic Valve: The aortic valve is normal in structure. Aortic valve regurgitation is not visualized. No aortic stenosis is present. Aortic valve peak gradient measures 11.2 mmHg. Pulmonic Valve: The pulmonic valve was normal in structure. Pulmonic valve regurgitation is not visualized. No evidence of pulmonic stenosis. Aorta: The aortic root is normal in size and structure. Venous: The inferior vena cava is normal in size with greater than 50% respiratory variability, suggesting right atrial pressure of 3 mmHg. IAS/Shunts: No atrial level shunt detected by color flow Doppler.  LEFT VENTRICLE PLAX 2D LVIDd:         4.54 cm  Diastology LVIDs:         3.34 cm  LV e' medial:    6.96 cm/s LV PW:         0.92 cm  LV E/e' medial:  14.3 LV IVS:        1.11 cm  LV e' lateral:   10.80 cm/s LVOT diam:     1.70 cm  LV E/e' lateral: 9.2 LVOT Area:     2.27 cm  RIGHT VENTRICLE RV Mid diam:    3.02 cm RV S prime:     13.20 cm/s TAPSE (M-mode): 1.8 cm LEFT ATRIUM             Index       RIGHT ATRIUM           Index LA diam:        4.20 cm 2.01 cm/m  RA Area:     15.20 cm LA Vol (A2C):   67.4 ml 32.25 ml/m RA Volume:   35.60 ml  17.03 ml/m LA Vol (A4C):   63.7 ml 30.48 ml/m LA Biplane Vol: 67.8 ml 32.44 ml/m  AORTIC VALVE                PULMONIC VALVE AV Area (Vmax): 1.67 cm    PV Vmax:        1.02 m/s AV Vmax:        167.00 cm/s PV Peak grad:   4.2 mmHg AV Peak Grad:   11.2 mmHg   RVOT Peak grad: 3 mmHg LVOT Vmax:      123.00 cm/s  AORTA Ao Root diam: 2.50 cm MITRAL VALVE MV Area (PHT): 4.80 cm    SHUNTS MV Decel Time: 158 msec    Systemic Diam: 1.70 cm MV E velocity: 99.80 cm/s MV A  velocity: 87.40 cm/s MV E/A ratio:  1.14 MV A Prime:    13.2 cm/s Julien Nordmann MD Electronically signed by Julien Nordmann MD Signature Date/Time: 08/26/2020/3:54:33 PM    Final         Scheduled Meds:  cloNIDine  0.1 mg Oral QID   Followed by   Melene Muller ON 08/29/2020] cloNIDine  0.1 mg Oral BH-qamhs   Followed by   Melene Muller ON 08/31/2020] cloNIDine  0.1 mg Oral QAC breakfast   folic acid  1 mg Oral Daily   heparin  5,000 Units Subcutaneous Q8H   multivitamin with minerals  1 tablet Oral Daily   nicotine  21 mg Transdermal Daily   thiamine  100 mg Oral Daily   Or   thiamine  100 mg Intravenous Daily   Continuous Infusions:  ceFEPime (MAXIPIME) IV 2 g (08/27/20 0907)     LOS: 2 days    Time spent: 30  minutes    Barb Merino, MD [facility] Pager [phone]

## 2020-08-27 NOTE — Progress Notes (Signed)
ID PROGRESS NOTE  50yo F with hx of ivdu, admitted for back pain. Found to have citrobacter bacteremia. Imaging concerning for discitis. Currently on cefepime. Having withdrawal symptoms.TTE negative. Urine cx ecoli.  - can potentially be treated with FQ for discitis - repeat blood cx are pending - Dr Rivka Safer to see tomorrow to give treatment options and recommendations - agree with treatment of opiate withdrawal symptoms  Madellyn Denio B. Drue Second MD MPH [facility] [phone]

## 2020-08-28 DIAGNOSIS — M4624 Osteomyelitis of vertebra, thoracic region: Secondary | ICD-10-CM

## 2020-08-28 DIAGNOSIS — F1910 Other psychoactive substance abuse, uncomplicated: Secondary | ICD-10-CM

## 2020-08-28 DIAGNOSIS — N3000 Acute cystitis without hematuria: Secondary | ICD-10-CM

## 2020-08-28 DIAGNOSIS — R911 Solitary pulmonary nodule: Secondary | ICD-10-CM

## 2020-08-28 DIAGNOSIS — R7881 Bacteremia: Secondary | ICD-10-CM

## 2020-08-28 DIAGNOSIS — E876 Hypokalemia: Secondary | ICD-10-CM

## 2020-08-28 DIAGNOSIS — K760 Fatty (change of) liver, not elsewhere classified: Secondary | ICD-10-CM

## 2020-08-28 DIAGNOSIS — A498 Other bacterial infections of unspecified site: Secondary | ICD-10-CM

## 2020-08-28 DIAGNOSIS — A419 Sepsis, unspecified organism: Secondary | ICD-10-CM

## 2020-08-28 LAB — HCV RNA QUANT
HCV Quantitative Log: 6.89 log10 IU/mL (ref >1.70)
HCV Quantitative: 7770000 IU/mL (ref >50)

## 2020-08-28 NOTE — Consult Note (Signed)
NAME: Brenda [name]  DOB: 18-Sep-1970  MRN: [id number]  Date/Time: 08/28/2020 2:12 PM  REQUESTING PROVIDER: Dr. Renae Gloss Subjective:  REASON FOR CONSULT: Citrobacter bacteremia with thoracic spine discitis ? Brenda [name] is a 50 y.o. female with a history of IVDA presents to the hospital on 08/24/2020 with 3 to 4 days of acute pain in mid back going down. Before she came to the hospital she had used IV heroin she says. She has a history of hepatitis C.  Not been treated. In the ED vitals temperature of 101.3, BP 131/75, heart rate 127, respiratory rate 20, sats 93%. WBC 11.1.  Hb 12.3, platelet 179 and creatinine 0.93.  Blood cultures were sent.  UA and urine culture was sent. MRI of the spine revealed paraspinal stranding and edema adjacent to the T8-T9 interspace.  This was in early developing paraspinous infection.  And with early changes of osteomyelitis about the adjacent T8-T9 interspace.  No discrete or drainable fluid collections were seen.  There was moderate right worse than left facet hypertrophy at L5-S1 with prominent joint effusion on the right.  There was no significant surrounding inflammatory changes to suggest septic arthritis. ID is asked to see the patient for antibiotic management. Patient is currently on vancomycin and cefepime.  Past Medical History:  Diagnosis Date   Ankle fracture, left    in past.   Anxiety    Arthritis    Right hip, scheduled for replacement 7/13/60m, left ankle   Asthma    Bipolar disorder (HCC)    Chronic hepatitis C (HCC)    Depression    Dyspnea    occasional with exertion   GERD (gastroesophageal reflux disease)    pmh   Headache    Hearing loss    some loss in both ears - no dx - per patient "runs in family", no hearing aids   Hypertension    Lump in female breast    right   Pneumonia    x 1   Polysubstance abuse (HCC)    Primary localized osteoarthritis of hip    right   SVD (spontaneous vaginal delivery)    x 3    Uses roller walker    occasional   Wears dentures    full upper    Past Surgical History:  Procedure Laterality Date   TOTAL HIP ARTHROPLASTY Right 09/13/2018   TOTAL HIP ARTHROPLASTY Right 09/13/2018   Procedure: RIGHT TOTAL HIP ARTHROPLASTY ANTERIOR APPROACH;  Surgeon: Tarry Kos, MD;  Location: [facility] OR;  Service: Orthopedics;  Laterality: Right;   TUBAL LIGATION      Social History   Socioeconomic History   Marital status: Single    Spouse name: Not on file   Number of children: Not on file   Years of education: Not on file   Highest education level: Not on file  Occupational History   Not on file  Tobacco Use   Smoking status: Every Day    Packs/day: 1.00    Years: 31.00    Pack years: 31.00    Types: Cigarettes   Smokeless tobacco: Never   Tobacco comments:    since age 71  Vaping Use   Vaping Use: Never used  Substance and Sexual Activity   Alcohol use: Yes    Comment: rare   Drug use: Not Currently    Frequency: 7.0 times per week    Types: Cocaine, Heroin    Comment: uses heroin for pain 09/03/18. Last cocaine 08/02/18  Sexual activity: Not Currently    Birth control/protection: Surgical    Comment: tubal   Other Topics Concern   Not on file  Social History Narrative   Not on file   Social Determinants of Health   Financial Resource Strain: Not on file  Food Insecurity: Not on file  Transportation Needs: Not on file  Physical Activity: Not on file  Stress: Not on file  Social Connections: Not on file  Intimate Partner Violence: Not on file    Family History  Problem Relation Age of Onset   Ovarian cancer Neg Hx    Colon cancer Neg Hx    Breast cancer Neg Hx    Allergies  Allergen Reactions   Flexeril [Cyclobenzaprine] Other (See Comments)    RLS-like symptoms    Seroquel [Quetiapine Fumarate] Other (See Comments)    RLS-like symptoms   I? Current Facility-Administered Medications  Medication Dose Route Frequency Provider Last Rate Last  Admin   acetaminophen (TYLENOL) tablet 650 mg  650 mg Oral Q6H PRN Lorretta Harp, MD   650 mg at 08/28/20 1346   ceFEPIme (MAXIPIME) 2 g in sodium chloride 0.9 % 100 mL IVPB  2 g Intravenous Q8H Dorothea Ogle B, RPH 200 mL/hr at 08/28/20 0915 2 g at 08/28/20 0915   cloNIDine (CATAPRES) tablet 0.1 mg  0.1 mg Oral QID Dorcas Carrow, MD   0.1 mg at 08/28/20 1345   Followed by   Melene Muller ON 08/29/2020] cloNIDine (CATAPRES) tablet 0.1 mg  0.1 mg Oral Alfonse Alpers, MD       Followed by   Melene Muller ON 08/31/2020] cloNIDine (CATAPRES) tablet 0.1 mg  0.1 mg Oral QAC breakfast Dorcas Carrow, MD       dicyclomine (BENTYL) tablet 20 mg  20 mg Oral Q6H PRN Dorcas Carrow, MD   20 mg at 08/28/20 0902   folic acid (FOLVITE) tablet 1 mg  1 mg Oral Daily Lorretta Harp, MD   1 mg at 08/28/20 0902   heparin injection 5,000 Units  5,000 Units Subcutaneous Alean Rinne, MD   5,000 Units at 08/28/20 1346   hydrALAZINE (APRESOLINE) injection 5 mg  5 mg Intravenous Q2H PRN Lorretta Harp, MD       hydrOXYzine (ATARAX/VISTARIL) tablet 25 mg  25 mg Oral Q6H PRN Dorcas Carrow, MD   25 mg at 08/28/20 0903   ketorolac (TORADOL) 15 MG/ML injection 15 mg  15 mg Intravenous Q6H PRN Lorretta Harp, MD   15 mg at 08/28/20 0915   loperamide (IMODIUM) capsule 2-4 mg  2-4 mg Oral PRN Dorcas Carrow, MD   2 mg at 08/27/20 1352   LORazepam (ATIVAN) tablet 0.5 mg  0.5 mg Oral Q6H PRN Lorretta Harp, MD   0.5 mg at 08/28/20 1222   methocarbamol (ROBAXIN) tablet 500 mg  500 mg Oral Q8H PRN Dorcas Carrow, MD   500 mg at 08/28/20 1126   multivitamin with minerals tablet 1 tablet  1 tablet Oral Daily Lorretta Harp, MD   1 tablet at 08/28/20 0902   naproxen (NAPROSYN) tablet 500 mg  500 mg Oral BID PRN Dorcas Carrow, MD   500 mg at 08/28/20 0603   nicotine (NICODERM CQ - dosed in mg/24 hours) patch 21 mg  21 mg Transdermal Daily Lorretta Harp, MD   21 mg at 08/28/20 0903   ondansetron (ZOFRAN) injection 4 mg  4 mg Intravenous Q8H PRN Lorretta Harp, MD        ondansetron (ZOFRAN-ODT) disintegrating tablet  4 mg  4 mg Oral Q6H PRN Dorcas Carrow, MD       thiamine tablet 100 mg  100 mg Oral Daily Lorretta Harp, MD   100 mg at 08/28/20 9470   Or   thiamine (B-1) injection 100 mg  100 mg Intravenous Daily Lorretta Harp, MD         Abtx:  Anti-infectives (From admission, onward)    Start     Dose/Rate Route Frequency Ordered Stop   08/25/20 2200  vancomycin (VANCOREADY) IVPB 1500 mg/300 mL  Status:  Discontinued        1,500 mg 150 mL/hr over 120 Minutes Intravenous Every 24 hours 08/25/20 0900 08/27/20 0935   08/25/20 1000  ceFEPIme (MAXIPIME) 2 g in sodium chloride 0.9 % 100 mL IVPB        2 g 200 mL/hr over 30 Minutes Intravenous Every 8 hours 08/25/20 0857     08/25/20 0030  Vancomycin (VANCOCIN) 1,500 mg in sodium chloride 0.9 % 500 mL IVPB       See Hyperspace for full Linked Orders Report.   1,500 mg 250 mL/hr over 120 Minutes Intravenous  Once 08/24/20 2238 08/25/20 0732   08/24/20 2330  vancomycin (VANCOCIN) IVPB 1000 mg/200 mL premix       See Hyperspace for full Linked Orders Report.   1,000 mg 200 mL/hr over 60 Minutes Intravenous  Once 08/24/20 2238 08/25/20 0107   08/24/20 2245  ceFEPIme (MAXIPIME) 2 g in sodium chloride 0.9 % 100 mL IVPB        2 g 200 mL/hr over 30 Minutes Intravenous  Once 08/24/20 2236 08/25/20 0024       REVIEW OF SYSTEMS:  Const:  fever,  chills, negative weight loss Eyes: negative diplopia or visual changes, negative eye pain ENT: negative coryza, negative sore throat Resp: negative cough, hemoptysis, dyspnea Cards: negative for chest pain, palpitations, lower extremity edema GU: negative for frequency, dysuria and hematuria GI: Negative for abdominal pain, has diarrhea, bleeding, constipation Skin: negative for rash and pruritus Heme: negative for easy bruising and gum/nose bleeding MS: myalgias, arthralgias, mid thoracic back pain and muscle weakness Neurolo:negative for headaches, dizziness, vertigo,  memory problems  Psych:  anxiety, depression  Endocrine: negative for thyroid, diabetes Allergy/Immunology-Flexeril and Seroquel in Objective:  VITALS:  BP 126/88 (BP Location: Left Arm)   Pulse 98   Temp 98.2 F (36.8 C) (Oral)   Resp 18   Ht 5\' 2"  (1.575 m)   Wt 111.9 kg   SpO2 99%   BMI 45.12 kg/m  PHYSICAL EXAM:  General: Alert, cooperative, some distress s, appears stated age.  Head: Normocephalic, without obvious abnormality, atraumatic. Eyes: Conjunctivae clear, anicteric sclerae. Pupils are equal ENT Nares normal. No drainage or sinus tenderness. Lips, mucosa, and tongue normal. No Thrush Neck: Supple, symmetrical, no adenopathy, thyroid: non tender no carotid bruit and no JVD. Back: Tenderness mid back. Lungs: Clear to auscultation bilaterally. No Wheezing or Rhonchi. No rales. Heart: Regular rate and rhythm, no murmur, rub or gallop. Abdomen: Soft, non-tender,not distended. Bowel sounds normal. No masses Extremities: atraumatic, no cyanosis. No edema. No clubbing Skin: No rashes or lesions. Or bruising Lymph: Cervical, supraclavicular normal. Neurologic: Grossly non-focal Pertinent Labs Lab Results CBC    Component Value Date/Time   WBC 8.7 08/27/2020 0454   RBC 4.35 08/27/2020 0454   HGB 9.8 (L) 08/27/2020 0454   HGB 12.8 12/22/2018 1203   HCT 32.1 (L) 08/27/2020 0454   HCT 40.1 12/22/2018  1203   PLT 230 08/27/2020 0454   PLT 341 12/22/2018 1203   MCV 73.8 (L) 08/27/2020 0454   MCV 71 (L) 12/22/2018 1203   MCV 64 (L) 05/11/2014 1424   MCH 22.5 (L) 08/27/2020 0454   MCHC 30.5 08/27/2020 0454   RDW 22.0 (H) 08/27/2020 0454   RDW 19.7 (H) 12/22/2018 1203   RDW 20.6 (H) 05/11/2014 1424   LYMPHSABS 2.3 08/27/2020 0454   LYMPHSABS 2.9 12/22/2018 1203   LYMPHSABS 2.2 10/08/2012 1837   MONOABS 0.5 08/27/2020 0454   MONOABS 0.8 10/08/2012 1837   EOSABS 0.0 08/27/2020 0454   EOSABS 0.0 12/22/2018 1203   EOSABS 0.2 10/08/2012 1837   BASOSABS 0.1 08/27/2020  0454   BASOSABS 0.1 12/22/2018 1203   BASOSABS 0.1 10/08/2012 1837    CMP Latest Ref Rng & Units 08/27/2020 08/26/2020 08/24/2020  Glucose 70 - 99 mg/dL 81 89 161(W)  BUN 6 - 20 mg/dL Creatinine 0.44 - 1.00 mg/dL 9.60 4.54 0.98  Sodium 135 - 145 mmol/L 139 138 134(L)  Potassium 3.5 - 5.1 mmol/L 4.1 3.7 3.0(L)  Chloride 98 - 111 mmol/L 110 106 96(L)  CO2 22 - 32 mmol/L Calcium 8.9 - 10.3 mg/dL 8.2(L) 7.8(L) 9.3  Total Protein 6.5 - 8.1 g/dL - - 7.5  Total Bilirubin 0.3 - 1.2 mg/dL - - 1.0  Alkaline Phos 38 - 126 U/L - - 93  AST 15 - 41 U/L - - 36  ALT 0 - 44 U/L - - 58(H)      Microbiology: Recent Results (from the past 240 hour(s))  Culture, blood (Routine x 2)     Status: Abnormal   Collection Time: 08/24/20 10:27 PM   Specimen: BLOOD  Result Value Ref Range Status   Specimen Description   Final    BLOOD LEFT ANTECUBITAL Performed at [facility], [address] [place], [place] [address]    Special Requests   Final    BOTTLES DRAWN AEROBIC AND ANAEROBIC Blood Culture adequate volume Performed at [facility], [address] [place], [place] [address]    Culture  Setup Time   Final    GRAM NEGATIVE RODS IN BOTH AEROBIC AND ANAEROBIC BOTTLES CRITICAL VALUE NOTED.  VALUE IS CONSISTENT WITH PREVIOUSLY REPORTED AND CALLED VALUE. Performed at [facility], [address] [place], [place] [address]    Culture (A)  Final    CITROBACTER FREUNDII SUSCEPTIBILITIES PERFORMED ON PREVIOUS CULTURE WITHIN THE LAST 5 DAYS. Performed at [facility], [address], [place], [place] [address]    Report Status 08/27/2020 FINAL  Final  Culture, blood (Routine x 2)     Status: Abnormal   Collection Time: 08/24/20 10:27 PM   Specimen: BLOOD  Result Value Ref Range Status   Specimen Description   Final    BLOOD RIGHT ANTECUBITAL Performed at [facility], [address] [place], [place] [address]    Special Requests    Final    BOTTLES DRAWN AEROBIC AND ANAEROBIC Blood Culture adequate volume Performed at [facility], [address] [place], [place] [address]    Culture  Setup Time   Final    GRAM NEGATIVE RODS IN BOTH AEROBIC AND ANAEROBIC BOTTLES CRITICAL RESULT CALLED TO, READ BACK BY AND VERIFIED WITH: Algie Coffer, PHARMD AT 1119 ON 08/25/20 BY GM Performed at [facility], [address], [place], [place] [address]  Culture CITROBACTER FREUNDII (A)  Final   Report Status 08/27/2020 FINAL  Final   Organism ID, Bacteria CITROBACTER FREUNDII  Final      Susceptibility   Citrobacter freundii - MIC*    CEFAZOLIN >=64 RESISTANT Resistant     CEFEPIME <=0.12 SENSITIVE Sensitive     CEFTAZIDIME <=1 SENSITIVE Sensitive     CEFTRIAXONE <=0.25 SENSITIVE Sensitive     CIPROFLOXACIN <=0.25 SENSITIVE Sensitive     GENTAMICIN <=1 SENSITIVE Sensitive     IMIPENEM 1 SENSITIVE Sensitive     TRIMETH/SULFA <=20 SENSITIVE Sensitive     PIP/TAZO <=4 SENSITIVE Sensitive     * CITROBACTER FREUNDII  Resp Panel by RT-PCR (Flu A&B, Covid) Nasopharyngeal Swab     Status: None   Collection Time: 08/24/20 10:27 PM   Specimen: Nasopharyngeal Swab; Nasopharyngeal(NP) swabs in vial transport medium  Result Value Ref Range Status   SARS Coronavirus 2 by RT PCR NEGATIVE NEGATIVE Final    Comment: (NOTE) SARS-CoV-2 target nucleic acids are NOT DETECTED.  The SARS-CoV-2 RNA is generally detectable in upper respiratory specimens during the acute phase of infection. The lowest concentration of SARS-CoV-2 viral copies this assay can detect is 138 copies/mL. A negative result does not preclude SARS-Cov-2 infection and should not be used as the sole basis for treatment or other patient management decisions. A negative result may occur with  improper specimen collection/handling, submission of specimen other than nasopharyngeal swab, presence of viral mutation(s) within the areas targeted by this assay, and  inadequate number of viral copies(<138 copies/mL). A negative result must be combined with clinical observations, patient history, and epidemiological information. The expected result is Negative.  Fact Sheet for Patients:  BloggerCourse.comhttps://www.fda.gov/media/152166/download  Fact Sheet for Healthcare Providers:  SeriousBroker.ithttps://www.fda.gov/media/152162/download  This test is no t yet approved or cleared by the [place] FDA and  has been authorized for detection and/or diagnosis of SARS-CoV-2 by FDA under an Emergency Use Authorization (EUA). This EUA will remain  in effect (meaning this test can be used) for the duration of the COVID-19 declaration under Section 564(b)(1) of the Act, 21 U.S.C.section 360bbb-3(b)(1), unless the authorization is terminated  or revoked sooner.       Influenza A by PCR NEGATIVE NEGATIVE Final   Influenza B by PCR NEGATIVE NEGATIVE Final    Comment: (NOTE) The Xpert Xpress SARS-CoV-2/FLU/RSV plus assay is intended as an aid in the diagnosis of influenza from Nasopharyngeal swab specimens and should not be used as a sole basis for treatment. Nasal washings and aspirates are unacceptable for Xpert Xpress SARS-CoV-2/FLU/RSV testing.  Fact Sheet for Patients: BloggerCourse.comhttps://www.fda.gov/media/152166/download  Fact Sheet for Healthcare Providers: SeriousBroker.ithttps://www.fda.gov/media/152162/download  This test is not yet approved or cleared by the [place] FDA and has been authorized for detection and/or diagnosis of SARS-CoV-2 by FDA under an Emergency Use Authorization (EUA). This EUA will remain in effect (meaning this test can be used) for the duration of the COVID-19 declaration under Section 564(b)(1) of the Act, 21 U.S.C. section 360bbb-3(b)(1), unless the authorization is terminated or revoked.  Performed at [facility], [address] [place], [place] [address]   Urine Culture     Status: Abnormal   Collection Time: 08/24/20 10:27 PM   Specimen:  Urine, Clean Catch  Result Value Ref Range Status   Specimen Description   Final    URINE, CLEAN CATCH Performed at [facility], [address] [place], [place] [address]    Special Requests   Final  NONE Performed at [facility], [address] [place], [place] [address]    Culture >=100,000 COLONIES/mL ESCHERICHIA COLI (A)  Final   Report Status 08/27/2020 FINAL  Final   Organism ID, Bacteria ESCHERICHIA COLI (A)  Final      Susceptibility   Escherichia coli - MIC*    AMPICILLIN <=2 SENSITIVE Sensitive     CEFAZOLIN <=4 SENSITIVE Sensitive     CEFEPIME <=0.12 SENSITIVE Sensitive     CEFTRIAXONE <=0.25 SENSITIVE Sensitive     CIPROFLOXACIN <=0.25 SENSITIVE Sensitive     GENTAMICIN <=1 SENSITIVE Sensitive     IMIPENEM <=0.25 SENSITIVE Sensitive     NITROFURANTOIN <=16 SENSITIVE Sensitive     TRIMETH/SULFA <=20 SENSITIVE Sensitive     AMPICILLIN/SULBACTAM <=2 SENSITIVE Sensitive     PIP/TAZO <=4 SENSITIVE Sensitive     * >=100,000 COLONIES/mL ESCHERICHIA COLI  Blood Culture ID Panel (Reflexed)     Status: Abnormal   Collection Time: 08/24/20 10:27 PM  Result Value Ref Range Status   Enterococcus faecalis NOT DETECTED NOT DETECTED Final   Enterococcus Faecium NOT DETECTED NOT DETECTED Final   Listeria monocytogenes NOT DETECTED NOT DETECTED Final   Staphylococcus species NOT DETECTED NOT DETECTED Final   Staphylococcus aureus (BCID) NOT DETECTED NOT DETECTED Final   Staphylococcus epidermidis NOT DETECTED NOT DETECTED Final   Staphylococcus lugdunensis NOT DETECTED NOT DETECTED Final   Streptococcus species NOT DETECTED NOT DETECTED Final   Streptococcus agalactiae NOT DETECTED NOT DETECTED Final   Streptococcus pneumoniae NOT DETECTED NOT DETECTED Final   Streptococcus pyogenes NOT DETECTED NOT DETECTED Final   A.calcoaceticus-baumannii NOT DETECTED NOT DETECTED Final   Bacteroides fragilis NOT DETECTED NOT DETECTED Final   Enterobacterales DETECTED  (A) NOT DETECTED Final    Comment: Enterobacterales represent a large order of gram negative bacteria, not a single organism. Refer to culture for further identification. CRITICAL RESULT CALLED TO, READ BACK BY AND VERIFIED WITH: SUSAN WATSON, PHARMD AT 1119 ON 08/25/20 BY GM    Enterobacter cloacae complex NOT DETECTED NOT DETECTED Final   Escherichia coli NOT DETECTED NOT DETECTED Final   Klebsiella aerogenes NOT DETECTED NOT DETECTED Final   Klebsiella oxytoca NOT DETECTED NOT DETECTED Final   Klebsiella pneumoniae NOT DETECTED NOT DETECTED Final   Proteus species NOT DETECTED NOT DETECTED Final   Salmonella species NOT DETECTED NOT DETECTED Final   Serratia marcescens NOT DETECTED NOT DETECTED Final   Haemophilus influenzae NOT DETECTED NOT DETECTED Final   Neisseria meningitidis NOT DETECTED NOT DETECTED Final   Pseudomonas aeruginosa NOT DETECTED NOT DETECTED Final   Stenotrophomonas maltophilia NOT DETECTED NOT DETECTED Final   Candida albicans NOT DETECTED NOT DETECTED Final   Candida auris NOT DETECTED NOT DETECTED Final   Candida glabrata NOT DETECTED NOT DETECTED Final   Candida krusei NOT DETECTED NOT DETECTED Final   Candida parapsilosis NOT DETECTED NOT DETECTED Final   Candida tropicalis NOT DETECTED NOT DETECTED Final   Cryptococcus neoformans/gattii NOT DETECTED NOT DETECTED Final   CTX-M ESBL NOT DETECTED NOT DETECTED Final   Carbapenem resistance IMP NOT DETECTED NOT DETECTED Final   Carbapenem resistance KPC NOT DETECTED NOT DETECTED Final   Carbapenem resistance NDM NOT DETECTED NOT DETECTED Final   Carbapenem resist OXA 48 LIKE NOT DETECTED NOT DETECTED Final   Carbapenem resistance VIM NOT DETECTED NOT DETECTED Final    Comment: Performed at [facility], [address] [place], [place] [address]  CULTURE, BLOOD (ROUTINE X 2) w  Reflex to ID Panel     Status: None (Preliminary result)   Collection Time: 08/26/20  5:49 AM   Specimen: BLOOD  Result  Value Ref Range Status   Specimen Description BLOOD BLOOD RIGHT HAND  Final   Special Requests IN PEDIATRIC BOTTLE Blood Culture adequate volume  Final   Culture   Final    NO GROWTH 2 DAYS Performed at [facility], [address], [place], [place] [address]    Report Status PENDING  Incomplete  CULTURE, BLOOD (ROUTINE X 2) w Reflex to ID Panel     Status: None (Preliminary result)   Collection Time: 08/26/20  5:49 AM   Specimen: BLOOD  Result Value Ref Range Status   Specimen Description BLOOD BLOOD LEFT HAND  Final   Special Requests   Final    BOTTLES DRAWN AEROBIC AND ANAEROBIC Blood Culture adequate volume   Culture   Final    NO GROWTH 2 DAYS Performed at [facility], [address], [place], [place] [address]    Report Status PENDING  Incomplete    IMAGING RESULTS: Question subtle paraspinous stranding and edema adjacent to the T8-9 interspace as above. Given the history of fever and mid back pain, changes are concerning for possible early/developing paraspinous infection. Question early changes of osteomyelitis about the adjacent T8-9 interspace. No discrete or drainable fluid collections. 2. No other evidence for acute infection elsewhere within the cervical, thoracic, or lumbar spine. No epidural abscess. 3. 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 4. Small left paracentral disc protrusion at T8-9 with mild flattening of the left hemi cord. No significant spinal stenosis. 5. Moderate right worse than left facet hypertrophy at L5-S1 with associated prominent joint effusion on the right. No significant surrounding inflammatory changes to suggest septic arthritis. Finding could contribute to lower back pain.  I have personally reviewed the films ? Impression/Recommendation ? 50 year old female with history of IVDA presenting with mid back pain and fever.  Citrobacter  bacteremia T8- T9 mild discitis with paraspinal infection. Will discuss with interventional radiology regarding the feasibility of aspiration. If that is not possible would very likely consider Citrobacter to be the cause for the discitis.  And would change cefepime to oral ciprofloxacin.  This has broad coverage including  MRSA. Need to outweigh the benefits and risk. Side effects including tendinitis, tendon rupture, C. difficile, were all discussed with the patient.  Hepatitis C infection RNA high Will need treatment as outpatient.  Discussed the management in detail with the patient.  Discussed with the hospitalist. ?  Note:  This document was prepared using Dragon voice recognition software and may include unintentional dictation errors.

## 2020-08-28 NOTE — Progress Notes (Signed)
Patient ID: Brenda [name], female   DOB: July 29, 1970, 50 y.o.   MRN: [id number] [facility] PROGRESS NOTE  Brenda [name]:[id number] DOB: 1970/03/10 DOA: 08/24/2020 PCP: Rolm Gala, NP  HPI/Subjective: Patient does have some mid back and low back pain.  Otherwise feels okay.  Appetite okay.  Note she has a history of hepatitis C but unable to get any treatment for this.  Admitted with back pain nausea vomiting and dysuria.  Objective: Vitals:   08/28/20 0521 08/28/20 0800  BP: 124/80 126/88  Pulse: 76 98  Resp: 16 18  Temp: 98 F (36.7 C) 98.2 F (36.8 C)  SpO2: 98% 99%    Intake/Output Summary (Last 24 hours) at 08/28/2020 1507 Last data filed at 08/28/2020 1300 Gross per 24 hour  Intake 820 ml  Output --  Net 820 ml   Filed Weights   08/24/20 2213 08/25/20 0651  Weight: 113.4 kg 111.9 kg    ROS: Review of Systems  Respiratory:  Negative for shortness of breath.   Cardiovascular:  Negative for chest pain.  Gastrointestinal:  Negative for abdominal pain, nausea and vomiting.  Musculoskeletal:  Positive for back pain.  Exam: Physical Exam HENT:     Head: Normocephalic.     Mouth/Throat:     Pharynx: No oropharyngeal exudate.  Eyes:     General: Lids are normal.     Conjunctiva/sclera: Conjunctivae normal.     Pupils: Pupils are equal, round, and reactive to light.  Cardiovascular:     Rate and Rhythm: Normal rate and regular rhythm.     Heart sounds: Normal heart sounds, S1 normal and S2 normal.  Pulmonary:     Breath sounds: Normal breath sounds. No decreased breath sounds, wheezing, rhonchi or rales.  Abdominal:     Palpations: Abdomen is soft.     Tenderness: There is no abdominal tenderness.  Musculoskeletal:     Right ankle: No swelling.     Left ankle: No swelling.     Comments: Slight pain with palpating over the thoracic and lumbar spine.  Skin:    General: Skin is warm.     Findings: No rash.  Neurological:     Mental  Status: She is alert and oriented to person, place, and time.     Data Reviewed: Basic Metabolic Panel: Recent Labs  Lab 08/24/20 2227 08/25/20 0319 08/26/20 0549 08/27/20 0454  NA 134*  --  138 139  K 3.0*  --  3.7 4.1  CL 96*  --  106 110  CO2 26  --  26 24  GLUCOSE 156*  --  89 81  BUN 7  --  8 7  CREATININE 0.93  --  0.69 0.67  CALCIUM 9.3  --  7.8* 8.2*  MG 1.2*  --   --  2.1  PHOS  --  2.6  --  3.2   Liver Function Tests: Recent Labs  Lab 08/24/20 2227  AST 36  ALT 58*  ALKPHOS 93  BILITOT 1.0  PROT 7.5  ALBUMIN 3.0*   Recent Labs  Lab 08/25/20 0319  LIPASE 24   No results for input(s): AMMONIA in the last 168 hours. CBC: Recent Labs  Lab 08/24/20 2227 08/26/20 0549 08/27/20 0454  WBC 11.1* 8.4 8.7  NEUTROABS 9.6*  --  5.5  HGB 12.3 9.9* 9.8*  HCT 39.2 33.3* 32.1*  MCV 73.1* 74.0* 73.8*  PLT 179 184 230   Cardiac Enzymes: No results for input(s): CKTOTAL,  CKMB, CKMBINDEX, TROPONINI in the last 168 hours. BNP (last 3 results) No results for input(s): BNP in the last 8760 hours.  ProBNP (last 3 results) No results for input(s): PROBNP in the last 8760 hours.  CBG: No results for input(s): GLUCAP in the last 168 hours.  Recent Results (from the past 240 hour(s))  Culture, blood (Routine x 2)     Status: Abnormal   Collection Time: 08/24/20 10:27 PM   Specimen: BLOOD  Result Value Ref Range Status   Specimen Description   Final    BLOOD LEFT ANTECUBITAL Performed at [facility], [address], [place], [place] [address]    Special Requests   Final    BOTTLES DRAWN AEROBIC AND ANAEROBIC Blood Culture adequate volume Performed at [facility], [address] [place], [place] [address]    Culture  Setup Time   Final    GRAM NEGATIVE RODS IN BOTH AEROBIC AND ANAEROBIC BOTTLES CRITICAL VALUE NOTED.  VALUE IS CONSISTENT WITH PREVIOUSLY REPORTED AND CALLED VALUE. Performed at [facility], [address] [place], [place] [address]    Culture (A)  Final    CITROBACTER FREUNDII SUSCEPTIBILITIES PERFORMED ON PREVIOUS CULTURE WITHIN THE LAST 5 DAYS. Performed at [facility], [address], [place], [place] [address]    Report Status 08/27/2020 FINAL  Final  Culture, blood (Routine x 2)     Status: Abnormal   Collection Time: 08/24/20 10:27 PM   Specimen: BLOOD  Result Value Ref Range Status   Specimen Description   Final    BLOOD RIGHT ANTECUBITAL Performed at [facility], [address] [place], [place] [address]    Special Requests   Final    BOTTLES DRAWN AEROBIC AND ANAEROBIC Blood Culture adequate volume Performed at [facility], [address], [place], [place] [address]    Culture  Setup Time   Final    GRAM NEGATIVE RODS IN BOTH AEROBIC AND ANAEROBIC BOTTLES CRITICAL RESULT CALLED TO, READ BACK BY AND VERIFIED WITH: Algie Coffer, PHARMD AT 1119 ON 08/25/20 BY GM Performed at [facility], [address] [place], [place] [address]    Culture CITROBACTER FREUNDII (A)  Final   Report Status 08/27/2020 FINAL  Final   Organism ID, Bacteria CITROBACTER FREUNDII  Final      Susceptibility   Citrobacter freundii - MIC*    CEFAZOLIN >=64 RESISTANT Resistant     CEFEPIME <=0.12 SENSITIVE Sensitive     CEFTAZIDIME <=1 SENSITIVE Sensitive     CEFTRIAXONE <=0.25 SENSITIVE Sensitive     CIPROFLOXACIN <=0.25 SENSITIVE Sensitive     GENTAMICIN <=1 SENSITIVE Sensitive     IMIPENEM 1 SENSITIVE Sensitive     TRIMETH/SULFA <=20 SENSITIVE Sensitive     PIP/TAZO <=4 SENSITIVE Sensitive     * CITROBACTER FREUNDII  Resp Panel by RT-PCR (Flu A&B, Covid) Nasopharyngeal Swab     Status: None   Collection Time: 08/24/20 10:27 PM   Specimen: Nasopharyngeal Swab; Nasopharyngeal(NP) swabs in vial transport medium  Result Value Ref Range Status   SARS Coronavirus 2 by RT PCR NEGATIVE NEGATIVE Final    Comment: (NOTE) SARS-CoV-2 target nucleic acids are NOT  DETECTED.  The SARS-CoV-2 RNA is generally detectable in upper respiratory specimens during the acute phase of infection. The lowest concentration of SARS-CoV-2 viral copies this assay can detect is 138 copies/mL. A negative result does not preclude SARS-Cov-2 infection and should not be used as  the sole basis for treatment or other patient management decisions. A negative result may occur with  improper specimen collection/handling, submission of specimen other than nasopharyngeal swab, presence of viral mutation(s) within the areas targeted by this assay, and inadequate number of viral copies(<138 copies/mL). A negative result must be combined with clinical observations, patient history, and epidemiological information. The expected result is Negative.  Fact Sheet for Patients:  BloggerCourse.com  Fact Sheet for Healthcare Providers:  SeriousBroker.it  This test is no t yet approved or cleared by the [place] FDA and  has been authorized for detection and/or diagnosis of SARS-CoV-2 by FDA under an Emergency Use Authorization (EUA). This EUA will remain  in effect (meaning this test can be used) for the duration of the COVID-19 declaration under Section 564(b)(1) of the Act, 21 U.S.C.section 360bbb-3(b)(1), unless the authorization is terminated  or revoked sooner.       Influenza A by PCR NEGATIVE NEGATIVE Final   Influenza B by PCR NEGATIVE NEGATIVE Final    Comment: (NOTE) The Xpert Xpress SARS-CoV-2/FLU/RSV plus assay is intended as an aid in the diagnosis of influenza from Nasopharyngeal swab specimens and should not be used as a sole basis for treatment. Nasal washings and aspirates are unacceptable for Xpert Xpress SARS-CoV-2/FLU/RSV testing.  Fact Sheet for Patients: BloggerCourse.com  Fact Sheet for Healthcare Providers: SeriousBroker.it  This test is not yet  approved or cleared by the [place] FDA and has been authorized for detection and/or diagnosis of SARS-CoV-2 by FDA under an Emergency Use Authorization (EUA). This EUA will remain in effect (meaning this test can be used) for the duration of the COVID-19 declaration under Section 564(b)(1) of the Act, 21 U.S.C. section 360bbb-3(b)(1), unless the authorization is terminated or revoked.  Performed at [facility], [address] [place], [place] [address]   Urine Culture     Status: Abnormal   Collection Time: 08/24/20 10:27 PM   Specimen: Urine, Clean Catch  Result Value Ref Range Status   Specimen Description   Final    URINE, CLEAN CATCH Performed at [facility], [address], [place], [place] [address]    Special Requests   Final    NONE Performed at [facility], [address] [place], [place] [address]    Culture >=100,000 COLONIES/mL ESCHERICHIA COLI (A)  Final   Report Status 08/27/2020 FINAL  Final   Organism ID, Bacteria ESCHERICHIA COLI (A)  Final      Susceptibility   Escherichia coli - MIC*    AMPICILLIN <=2 SENSITIVE Sensitive     CEFAZOLIN <=4 SENSITIVE Sensitive     CEFEPIME <=0.12 SENSITIVE Sensitive     CEFTRIAXONE <=0.25 SENSITIVE Sensitive     CIPROFLOXACIN <=0.25 SENSITIVE Sensitive     GENTAMICIN <=1 SENSITIVE Sensitive     IMIPENEM <=0.25 SENSITIVE Sensitive     NITROFURANTOIN <=16 SENSITIVE Sensitive     TRIMETH/SULFA <=20 SENSITIVE Sensitive     AMPICILLIN/SULBACTAM <=2 SENSITIVE Sensitive     PIP/TAZO <=4 SENSITIVE Sensitive     * >=100,000 COLONIES/mL ESCHERICHIA COLI  Blood Culture ID Panel (Reflexed)     Status: Abnormal   Collection Time: 08/24/20 10:27 PM  Result Value Ref Range Status   Enterococcus faecalis NOT DETECTED NOT DETECTED Final   Enterococcus Faecium NOT DETECTED NOT DETECTED Final   Listeria monocytogenes NOT DETECTED NOT DETECTED Final   Staphylococcus species NOT DETECTED NOT DETECTED  Final   Staphylococcus aureus (BCID) NOT DETECTED NOT  DETECTED Final   Staphylococcus epidermidis NOT DETECTED NOT DETECTED Final   Staphylococcus lugdunensis NOT DETECTED NOT DETECTED Final   Streptococcus species NOT DETECTED NOT DETECTED Final   Streptococcus agalactiae NOT DETECTED NOT DETECTED Final   Streptococcus pneumoniae NOT DETECTED NOT DETECTED Final   Streptococcus pyogenes NOT DETECTED NOT DETECTED Final   A.calcoaceticus-baumannii NOT DETECTED NOT DETECTED Final   Bacteroides fragilis NOT DETECTED NOT DETECTED Final   Enterobacterales DETECTED (A) NOT DETECTED Final    Comment: Enterobacterales represent a large order of gram negative bacteria, not a single organism. Refer to culture for further identification. CRITICAL RESULT CALLED TO, READ BACK BY AND VERIFIED WITH: SUSAN WATSON, PHARMD AT 1119 ON 08/25/20 BY GM    Enterobacter cloacae complex NOT DETECTED NOT DETECTED Final   Escherichia coli NOT DETECTED NOT DETECTED Final   Klebsiella aerogenes NOT DETECTED NOT DETECTED Final   Klebsiella oxytoca NOT DETECTED NOT DETECTED Final   Klebsiella pneumoniae NOT DETECTED NOT DETECTED Final   Proteus species NOT DETECTED NOT DETECTED Final   Salmonella species NOT DETECTED NOT DETECTED Final   Serratia marcescens NOT DETECTED NOT DETECTED Final   Haemophilus influenzae NOT DETECTED NOT DETECTED Final   Neisseria meningitidis NOT DETECTED NOT DETECTED Final   Pseudomonas aeruginosa NOT DETECTED NOT DETECTED Final   Stenotrophomonas maltophilia NOT DETECTED NOT DETECTED Final   Candida albicans NOT DETECTED NOT DETECTED Final   Candida auris NOT DETECTED NOT DETECTED Final   Candida glabrata NOT DETECTED NOT DETECTED Final   Candida krusei NOT DETECTED NOT DETECTED Final   Candida parapsilosis NOT DETECTED NOT DETECTED Final   Candida tropicalis NOT DETECTED NOT DETECTED Final   Cryptococcus neoformans/gattii NOT DETECTED NOT DETECTED Final   CTX-M ESBL NOT DETECTED NOT  DETECTED Final   Carbapenem resistance IMP NOT DETECTED NOT DETECTED Final   Carbapenem resistance KPC NOT DETECTED NOT DETECTED Final   Carbapenem resistance NDM NOT DETECTED NOT DETECTED Final   Carbapenem resist OXA 48 LIKE NOT DETECTED NOT DETECTED Final   Carbapenem resistance VIM NOT DETECTED NOT DETECTED Final    Comment: Performed at [facility], [address] [place], [place] [address]  CULTURE, BLOOD (ROUTINE X 2) w Reflex to ID Panel     Status: None (Preliminary result)   Collection Time: 08/26/20  5:49 AM   Specimen: BLOOD  Result Value Ref Range Status   Specimen Description BLOOD BLOOD RIGHT HAND  Final   Special Requests IN PEDIATRIC BOTTLE Blood Culture adequate volume  Final   Culture   Final    NO GROWTH 2 DAYS Performed at [facility], [address] [place], [place] [address]    Report Status PENDING  Incomplete  CULTURE, BLOOD (ROUTINE X 2) w Reflex to ID Panel     Status: None (Preliminary result)   Collection Time: 08/26/20  5:49 AM   Specimen: BLOOD  Result Value Ref Range Status   Specimen Description BLOOD BLOOD LEFT HAND  Final   Special Requests   Final    BOTTLES DRAWN AEROBIC AND ANAEROBIC Blood Culture adequate volume   Culture   Final    NO GROWTH 2 DAYS Performed at [facility], [address] [place], [place] [address]    Report Status PENDING  Incomplete     Studies: ECHOCARDIOGRAM COMPLETE  Result Date: 08/26/2020    ECHOCARDIOGRAM REPORT   Patient Name:   Brenda [name] Date of Exam: 08/26/2020 Medical Rec #:  [id number]  Height:       62.0 in Accession #:    [id number]         Weight:       246.7 lb Date of Birth:  06-01-1970          BSA:          2.090 m Patient Age:    50 years           BP:           149/84 mmHg Patient Gender: F                  HR:           85 bpm. Exam Location:  [facility] Procedure: 2D Echo, Color Doppler and Cardiac Doppler Indications:     Bacteremia R78.81  History:          Patient has no prior history of Echocardiogram examinations.                  Angina, Signs/Symptoms:Shortness of Breath; Risk                  Factors:Hypertension.  Sonographer:     Neysa Bonito Roar Referring Phys:  [id number] T VU Diagnosing Phys: Julien Nordmann MD IMPRESSIONS  1. Left ventricular ejection fraction, by estimation, is 60 to 65%. The left ventricle has normal function. The left ventricle has no regional wall motion abnormalities. Left ventricular diastolic parameters are consistent with Grade II diastolic dysfunction (pseudonormalization).  2. Right ventricular systolic function is normal. The right ventricular size is normal. Tricuspid regurgitation signal is inadequate for assessing PA pressure.  3. Left atrial size was mildly dilated.  4. The mitral valve is normal in structure. Mild mitral valve regurgitation.  5. No valve vegetation noted. FINDINGS  Left Ventricle: Left ventricular ejection fraction, by estimation, is 60 to 65%. The left ventricle has normal function. The left ventricle has no regional wall motion abnormalities. The left ventricular internal cavity size was normal in size. There is  no left ventricular hypertrophy. Left ventricular diastolic parameters are consistent with Grade II diastolic dysfunction (pseudonormalization). Right Ventricle: The right ventricular size is normal. No increase in right ventricular wall thickness. Right ventricular systolic function is normal. Tricuspid regurgitation signal is inadequate for assessing PA pressure. Left Atrium: Left atrial size was mildly dilated. Right Atrium: Right atrial size was normal in size. Pericardium: There is no evidence of pericardial effusion. Mitral Valve: The mitral valve is normal in structure. Mild mitral valve regurgitation. No evidence of mitral valve stenosis. Tricuspid Valve: The tricuspid valve is normal in structure. Tricuspid valve regurgitation is mild . No evidence of tricuspid stenosis. Aortic Valve: The  aortic valve is normal in structure. Aortic valve regurgitation is not visualized. No aortic stenosis is present. Aortic valve peak gradient measures 11.2 mmHg. Pulmonic Valve: The pulmonic valve was normal in structure. Pulmonic valve regurgitation is not visualized. No evidence of pulmonic stenosis. Aorta: The aortic root is normal in size and structure. Venous: The inferior vena cava is normal in size with greater than 50% respiratory variability, suggesting right atrial pressure of 3 mmHg. IAS/Shunts: No atrial level shunt detected by color flow Doppler.  LEFT VENTRICLE PLAX 2D LVIDd:         4.54 cm  Diastology LVIDs:         3.34 cm  LV e' medial:    6.96 cm/s LV PW:         0.92  cm  LV E/e' medial:  14.3 LV IVS:        1.11 cm  LV e' lateral:   10.80 cm/s LVOT diam:     1.70 cm  LV E/e' lateral: 9.2 LVOT Area:     2.27 cm  RIGHT VENTRICLE RV Mid diam:    3.02 cm RV S prime:     13.20 cm/s TAPSE (M-mode): 1.8 cm LEFT ATRIUM             Index       RIGHT ATRIUM           Index LA diam:        4.20 cm 2.01 cm/m  RA Area:     15.20 cm LA Vol (A2C):   67.4 ml 32.25 ml/m RA Volume:   35.60 ml  17.03 ml/m LA Vol (A4C):   63.7 ml 30.48 ml/m LA Biplane Vol: 67.8 ml 32.44 ml/m  AORTIC VALVE                PULMONIC VALVE AV Area (Vmax): 1.67 cm    PV Vmax:        1.02 m/s AV Vmax:        167.00 cm/s PV Peak grad:   4.2 mmHg AV Peak Grad:   11.2 mmHg   RVOT Peak grad: 3 mmHg LVOT Vmax:      123.00 cm/s  AORTA Ao Root diam: 2.50 cm MITRAL VALVE MV Area (PHT): 4.80 cm    SHUNTS MV Decel Time: 158 msec    Systemic Diam: 1.70 cm MV E velocity: 99.80 cm/s MV A velocity: 87.40 cm/s MV E/A ratio:  1.14 MV A Prime:    13.2 cm/s Julien Nordmann MD Electronically signed by Julien Nordmann MD Signature Date/Time: 08/26/2020/3:54:33 PM    Final     Scheduled Meds: . cloNIDine  0.1 mg Oral QID   Followed by  . [START ON 08/29/2020] cloNIDine  0.1 mg Oral BH-qamhs   Followed by  . [START ON 08/31/2020] cloNIDine  0.1 mg Oral  QAC breakfast  . folic acid  1 mg Oral Daily  . heparin  5,000 Units Subcutaneous Q8H  . multivitamin with minerals  1 tablet Oral Daily  . nicotine  21 mg Transdermal Daily  . thiamine  100 mg Oral Daily   Or  . thiamine  100 mg Intravenous Daily   Continuous Infusions: . 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 coli.  Patient on Maxipime.  Patient will be evaluated by infectious disease.  Continue IV antibiotics here.  Repeat blood cultures so far negative. History of polysubstance abuse Opiate withdrawal yesterday.  Started on clonidine, baclofen and NSAIDs and Imodium. Hypokalemia and hypomagnesemia.  Replaced into the normal range. Groundglass pulmonary nodule recommend repeat CT scan in 6 to 12 months Hepatic steatosis with history of hepatitis C.  Will need outpatient follow-up        Code Status:     Code Status Orders  (From admission, onward)           Start     Ordered   08/25/20 0857  Full code  Continuous        08/25/20 0857           Code Status History     Date Active Date Inactive Code Status Order ID Comments User Context  09/13/2018 1531 09/14/2018 1808 Full Code [id number]  Tarry KosXu, Naiping M, MD Inpatient   05/22/2018 2004 05/25/2018 1715 Full Code [id number]  Campbell StallMayo, Katy Dodd, MD Inpatient   05/01/2018 0026 05/02/2018 1547 Full Code [id number]  Arnaldo Nataliamond, Michael S, MD Inpatient   05/01/2016 2036 05/04/2016 1944 Full Code [id number]  Clapacs, Jackquline DenmarkJohn T, MD Inpatient      Family Communication: Declined Disposition Plan: Status is: Inpatient  Dispo: The patient is from: Home              Anticipated d/c is to: Home              Patient currently on IV antibiotics secondary to Citrobacter sepsis   Difficult to place patient.  No.  Consultants: Infectious disease  Antibiotics: Cefepime  Time spent: 27 minutes Case discussed with  ID.  Andriana Casa [employer]  [facility]

## 2020-08-28 NOTE — Care Management (Signed)
TOC assessment complete.  Full note to follow  

## 2020-08-29 ENCOUNTER — Other Ambulatory Visit: Payer: Self-pay | Admitting: Family Medicine

## 2020-08-29 ENCOUNTER — Other Ambulatory Visit: Payer: Self-pay

## 2020-08-29 DIAGNOSIS — N3001 Acute cystitis with hematuria: Secondary | ICD-10-CM

## 2020-08-29 DIAGNOSIS — B1920 Unspecified viral hepatitis C without hepatic coma: Secondary | ICD-10-CM

## 2020-08-29 DIAGNOSIS — M4644 Discitis, unspecified, thoracic region: Secondary | ICD-10-CM

## 2020-08-29 LAB — CBC
HCT: 34.2 % — ABNORMAL LOW (ref 36.0–46.0)
Hemoglobin: 10.3 g/dL — ABNORMAL LOW (ref 12.0–15.0)
MCH: 22.7 pg — ABNORMAL LOW (ref 26.0–34.0)
MCHC: 30.1 g/dL (ref 30.0–36.0)
MCV: 75.3 fL — ABNORMAL LOW (ref 80.0–100.0)
Platelets: 296 10*3/uL (ref 150–400)
RBC: 4.54 MIL/uL (ref 3.87–5.11)
RDW: 22.6 % — ABNORMAL HIGH (ref 11.5–15.5)
WBC: 10.6 10*3/uL — ABNORMAL HIGH (ref 4.0–10.5)
nRBC: 0 % (ref 0.0–0.2)

## 2020-08-29 LAB — POTASSIUM: Potassium: 4.6 mmol/L (ref 3.5–5.1)

## 2020-08-29 LAB — MAGNESIUM: Magnesium: 1.9 mg/dL (ref 1.7–2.4)

## 2020-08-29 MED ORDER — CIPROFLOXACIN HCL 500 MG PO TABS
750.0000 mg | ORAL_TABLET | Freq: Two times a day (BID) | ORAL | Status: DC
  Administered 2020-08-29: 750 mg via ORAL
  Filled 2020-08-29: qty 2

## 2020-08-29 MED ORDER — FOLIC ACID 1 MG PO TABS
1.0000 mg | ORAL_TABLET | Freq: Every day | ORAL | 0 refills | Status: DC
  Filled 2020-08-29: qty 30, 30d supply, fill #0

## 2020-08-29 MED ORDER — IBUPROFEN 600 MG PO TABS
600.0000 mg | ORAL_TABLET | Freq: Three times a day (TID) | ORAL | 0 refills | Status: DC | PRN
  Filled 2020-08-29: qty 30, 10d supply, fill #0

## 2020-08-29 MED ORDER — METHOCARBAMOL 500 MG PO TABS
500.0000 mg | ORAL_TABLET | Freq: Once | ORAL | Status: AC
  Administered 2020-08-29: 500 mg via ORAL

## 2020-08-29 MED ORDER — VITAMIN B-1 100 MG PO TABS
100.0000 mg | ORAL_TABLET | Freq: Every day | ORAL | 0 refills | Status: DC
  Filled 2020-08-29: qty 30, 30d supply, fill #0

## 2020-08-29 MED ORDER — TIZANIDINE HCL 2 MG PO TABS
2.0000 mg | ORAL_TABLET | Freq: Three times a day (TID) | ORAL | 0 refills | Status: DC | PRN
  Filled 2020-08-29: qty 15, 5d supply, fill #0

## 2020-08-29 MED ORDER — METHOCARBAMOL 500 MG PO TABS
500.0000 mg | ORAL_TABLET | Freq: Three times a day (TID) | ORAL | 0 refills | Status: DC | PRN
  Filled 2020-08-29: qty 20, 7d supply, fill #0

## 2020-08-29 MED ORDER — TIZANIDINE HCL 4 MG PO TABS
4.0000 mg | ORAL_TABLET | ORAL | Status: AC
  Administered 2020-08-29: 4 mg via ORAL
  Filled 2020-08-29: qty 1

## 2020-08-29 MED ORDER — CIPROFLOXACIN HCL 500 MG PO TABS
750.0000 mg | ORAL_TABLET | Freq: Two times a day (BID) | ORAL | 0 refills | Status: DC
  Filled 2020-08-29: qty 114, 38d supply, fill #0

## 2020-08-29 NOTE — Discharge Summary (Signed)
[facility]   PATIENT NAME: Brenda [name]    MR#:  [id number]  DATE OF BIRTH:  [birth year]  DATE OF ADMISSION:  08/24/2020 ADMITTING PHYSICIAN: Lorretta Harp, MD  DATE OF DISCHARGE: 08/29/2020 11:37 AM  PRIMARY CARE PHYSICIAN: Rolm Gala, NP    ADMISSION DIAGNOSIS:  Hypokalemia [E87.6] Hypomagnesemia [E83.42] Back pain [M54.9] Cellulitis of right hand [L03.113] Acute cystitis without hematuria [N30.00] IV drug abuse (HCC) [F19.10] Osteomyelitis of thoracic spine (HCC) [M46.24] Sepsis (HCC) [A41.9] Chest pain, unspecified type [R07.9] Sepsis, due to unspecified organism, unspecified whether acute organ dysfunction present (HCC) [A41.9]  DISCHARGE DIAGNOSIS:  Principal Problem:   Osteomyelitis of thoracic spine (HCC) Active Problems:   Alcohol abuse   Tobacco use disorder   Sepsis (HCC)   History of asthma   Back pain   Polysubstance abuse (HCC)   IVDU (intravenous drug user)   HTN (hypertension)   Depression with anxiety   Nausea & vomiting   Hypokalemia   Hypomagnesemia   Pulmonary nodule   UTI (urinary tract infection)   Cellulitis of right hand   Bacteremia due to Gram-negative bacteria   Citrobacter infection   Hepatic steatosis   SECONDARY DIAGNOSIS:   Past Medical History:  Diagnosis Date  . Ankle fracture, left    in past.  . Anxiety   . Arthritis    Right hip, scheduled for replacement 7/13/4m, left ankle  . Asthma   . Bipolar disorder (HCC)   . Chronic hepatitis C (HCC)   . Depression   . Dyspnea    occasional with exertion  . GERD (gastroesophageal reflux disease)    pmh  . Headache   . Hearing loss    some loss in both ears - no dx - per patient "runs in family", no hearing aids  . Hypertension   . Lump in female breast    right  . Pneumonia    x 1  . Polysubstance abuse (HCC)   . Primary localized osteoarthritis of hip    right  . SVD (spontaneous vaginal delivery)    x 3  . Uses  roller walker    occasional  . Wears dentures    full upper    HOSPITAL COURSE:   Citrobacter sepsis, present on admission with suspected discitis T8-T9 with paraspinal infection.  Patient was initially on Maxipime.  Infectious disease changed antibiotics over to Cipro orally.  Repeat blood cultures negative for 3 days.  Infectious disease recommends a total of 6 weeks of oral Cipro upon discharge (prescription for 38 more days prescribed).  Follow-up with infectious disease as outpatient and [facility] as outpatient.  Ibuprofen and Robaxin prescribed for pain. History of polysubstance abuse with possible opioid withdrawal 2 days ago. Hypokalemia and hypomagnesemia.  These were replaced into the normal range. Groundglass 7 mm pulmonary nodule.  Recommend rechecking CT scan in 6 to 12 months to ensure stability. Hepatic steatosis with history of hepatitis C.  Will need outpatient follow-up and can consider treatment for hepatitis C as outpatient. E. coli UTI.  Antibiotics for Citrobacter will cover.  DISCHARGE CONDITIONS:   Satisfactory  CONSULTS OBTAINED:  Infectious disease  DRUG ALLERGIES:   Allergies  Allergen Reactions  . Flexeril [Cyclobenzaprine] Other (See Comments)    RLS-like symptoms   . Seroquel [Quetiapine Fumarate] Other (See Comments)    RLS-like symptoms    DISCHARGE MEDICATIONS:   Allergies as of 08/29/2020       Reactions  Flexeril [cyclobenzaprine] Other (See Comments)   RLS-like symptoms    Seroquel [quetiapine Fumarate] Other (See Comments)   RLS-like symptoms        Medication List     STOP taking these medications    albuterol 108 (90 Base) MCG/ACT inhaler Commonly known as: VENTOLIN HFA   aspirin 81 MG chewable tablet   diltiazem 120 MG 24 hr capsule Commonly known as: CARDIZEM CD   Fluticasone-Salmeterol 100-50 MCG/DOSE Aepb Commonly known as: ADVAIR   guaifenesin 100 MG/5ML syrup Commonly known as: ROBITUSSIN       TAKE  these medications    ciprofloxacin 500 MG tablet Commonly known as: CIPRO Take 1.5 tablets (750 mg total) by mouth 2 (two) times daily.   folic acid 1 MG tablet Commonly known as: FOLVITE Take 1 tablet (1 mg total) by mouth once daily. Start taking on: August 30, 2020   ibuprofen 600 MG tablet Commonly known as: ADVIL Take 1 tablet (600 mg total) by mouth every 8 (eight) hours as needed.   methocarbamol 500 MG tablet Commonly known as: Robaxin Take 1 tablet (500 mg total) by mouth every 8 (eight) hours as needed for muscle spasms.   thiamine 100 MG tablet Commonly known as: VITAMIN B-1 Take 1 tablet (100 mg total) by mouth once daily. Start taking on: August 30, 2020         DISCHARGE INSTRUCTIONS:  Follow-up [facility] Follow-up Dr. Joylene Draft infectious disease  If you experience worsening of your admission symptoms, develop shortness of breath, life threatening emergency, suicidal or homicidal thoughts you must seek medical attention immediately by calling 911 or calling your MD immediately  if symptoms less severe.  You Must read complete instructions/literature along with all the possible adverse reactions/side effects for all the Medicines you take and that have been prescribed to you. Take any new Medicines after you have completely understood and accept all the possible adverse reactions/side effects.   Please note  You were cared for by a hospitalist during your hospital stay. If you have any questions about your discharge medications or the care you received while you were in the hospital after you are discharged, you can call the unit and asked to speak with the hospitalist on call if the hospitalist that took care of you is not available. Once you are discharged, your primary care physician will handle any further medical issues. Please note that NO REFILLS for any discharge medications will be authorized once you are discharged, as it is imperative that you  return to your primary care physician (or establish a relationship with a primary care physician if you do not have one) for your aftercare needs so that they can reassess your need for medications and monitor your lab values.    Today   CHIEF COMPLAINT:   Chief Complaint  Patient presents with  . Back Pain    HISTORY OF PRESENT ILLNESS:  Natacha Jepsen  is a 50 y.o. female was admitted with back pain.  Found to have sepsis.   VITAL SIGNS:  Blood pressure 112/61, pulse 80, temperature 97.8 F (36.6 C), resp. rate 16, height 5\' 2"  (1.575 m), weight 111.9 kg, SpO2 97 %.  I/O:  No intake or output data in the 24 hours ending 08/29/20 1710  PHYSICAL EXAMINATION:  GENERAL:  50 y.o.-year-old patient lying in the bed with no acute distress.  EYES: Pupils equal, round, reactive to light and accommodation. No scleral icterus. Extraocular muscles intact.  HEENT: Head  atraumatic, normocephalic. Oropharynx and nasopharynx clear.  NECK:  Supple, no jugular venous distention. No thyroid enlargement, no tenderness.  LUNGS: Normal breath sounds bilaterally, no wheezing, rales,rhonchi or crepitation. No use of accessory muscles of respiration.  CARDIOVASCULAR: S1, S2 normal. No murmurs, rubs, or gallops.  ABDOMEN: Soft, non-tender, non-distended. Bowel sounds present. No organomegaly or mass.  EXTREMITIES: No pedal edema, cyanosis, or clubbing.  NEUROLOGIC: Cranial nerves II through XII are intact. Muscle strength 5/5 in all extremities. Sensation intact. Gait not checked.  PSYCHIATRIC: The patient is alert and oriented x 3.  SKIN: No obvious rash, lesion, or ulcer.   DATA REVIEW:   CBC Recent Labs  Lab 08/29/20 0412  WBC 10.6*  HGB 10.3*  HCT 34.2*  PLT 296    Chemistries  Recent Labs  Lab 08/24/20 2227 08/26/20 0549 08/27/20 0454 08/29/20 0412  NA 134*   < > 139  --   K 3.0*   < > 4.1 4.6  CL 96*   < > 110  --   CO2 26   < > 24  --   GLUCOSE 156*   < > 81  --   BUN 7    < > 7  --   CREATININE 0.93   < > 0.67  --   CALCIUM 9.3   < > 8.2*  --   MG 1.2*  --  2.1 1.9  AST 36  --   --   --   ALT 58*  --   --   --   ALKPHOS 93  --   --   --   BILITOT 1.0  --   --   --    < > = values in this interval not displayed.    Cardiac Enzymes No results for input(s): TROPONINI in the last 168 hours.  Microbiology Results  Results for orders placed or performed during the hospital encounter of 08/24/20  Culture, blood (Routine x 2)     Status: Abnormal   Collection Time: 08/24/20 10:27 PM   Specimen: BLOOD  Result Value Ref Range Status   Specimen Description   Final    BLOOD LEFT ANTECUBITAL Performed at [facility], [address], [place], [place] [address]    Special Requests   Final    BOTTLES DRAWN AEROBIC AND ANAEROBIC Blood Culture adequate volume Performed at [facility], [address], [place], [place] [address]    Culture  Setup Time   Final    GRAM NEGATIVE RODS IN BOTH AEROBIC AND ANAEROBIC BOTTLES CRITICAL VALUE NOTED.  VALUE IS CONSISTENT WITH PREVIOUSLY REPORTED AND CALLED VALUE. Performed at [facility], [address] [place], [place] [address]    Culture (A)  Final    CITROBACTER FREUNDII SUSCEPTIBILITIES PERFORMED ON PREVIOUS CULTURE WITHIN THE LAST 5 DAYS. Performed at [facility], [address] [place], [place] [address]    Report Status 08/27/2020 FINAL  Final  Culture, blood (Routine x 2)     Status: Abnormal   Collection Time: 08/24/20 10:27 PM   Specimen: BLOOD  Result Value Ref Range Status   Specimen Description   Final    BLOOD RIGHT ANTECUBITAL Performed at [facility], [address], [place], [place] [address]    Special Requests   Final    BOTTLES DRAWN AEROBIC AND ANAEROBIC Blood Culture adequate volume Performed at [facility], [address] [place], [place] [address]    Culture  Setup Time   Final    GRAM NEGATIVE RODS IN BOTH AEROBIC AND  ANAEROBIC BOTTLES CRITICAL RESULT CALLED TO, READ BACK BY AND VERIFIED WITH: Algie Coffer, PHARMD AT 1119 ON 08/25/20 BY GM Performed at [facility], [address], [place], [place] [address]    Culture CITROBACTER FREUNDII (A)  Final   Report Status 08/27/2020 FINAL  Final   Organism ID, Bacteria CITROBACTER FREUNDII  Final      Susceptibility   Citrobacter freundii - MIC*    CEFAZOLIN >=64 RESISTANT Resistant     CEFEPIME <=0.12 SENSITIVE Sensitive     CEFTAZIDIME <=1 SENSITIVE Sensitive     CEFTRIAXONE <=0.25 SENSITIVE Sensitive     CIPROFLOXACIN <=0.25 SENSITIVE Sensitive     GENTAMICIN <=1 SENSITIVE Sensitive     IMIPENEM 1 SENSITIVE Sensitive     TRIMETH/SULFA <=20 SENSITIVE Sensitive     PIP/TAZO <=4 SENSITIVE Sensitive     * CITROBACTER FREUNDII  Resp Panel by RT-PCR (Flu A&B, Covid) Nasopharyngeal Swab     Status: None   Collection Time: 08/24/20 10:27 PM   Specimen: Nasopharyngeal Swab; Nasopharyngeal(NP) swabs in vial transport medium  Result Value Ref Range Status   SARS Coronavirus 2 by RT PCR NEGATIVE NEGATIVE Final    Comment: (NOTE) SARS-CoV-2 target nucleic acids are NOT DETECTED.  The SARS-CoV-2 RNA is generally detectable in upper respiratory specimens during the acute phase of infection. The lowest concentration of SARS-CoV-2 viral copies this assay can detect is 138 copies/mL. A negative result does not preclude SARS-Cov-2 infection and should not be used as the sole basis for treatment or other patient management decisions. A negative result may occur with  improper specimen collection/handling, submission of specimen other than nasopharyngeal swab, presence of viral mutation(s) within the areas targeted by this assay, and inadequate number of viral copies(<138 copies/mL). A negative result must be combined with clinical observations, patient history, and epidemiological information. The expected result is Negative.  Fact Sheet for Patients:   BloggerCourse.com  Fact Sheet for Healthcare Providers:  SeriousBroker.it  This test is no t yet approved or cleared by the [place] FDA and  has been authorized for detection and/or diagnosis of SARS-CoV-2 by FDA under an Emergency Use Authorization (EUA). This EUA will remain  in effect (meaning this test can be used) for the duration of the COVID-19 declaration under Section 564(b)(1) of the Act, 21 U.S.C.section 360bbb-3(b)(1), unless the authorization is terminated  or revoked sooner.       Influenza A by PCR NEGATIVE NEGATIVE Final   Influenza B by PCR NEGATIVE NEGATIVE Final    Comment: (NOTE) The Xpert Xpress SARS-CoV-2/FLU/RSV plus assay is intended as an aid in the diagnosis of influenza from Nasopharyngeal swab specimens and should not be used as a sole basis for treatment. Nasal washings and aspirates are unacceptable for Xpert Xpress SARS-CoV-2/FLU/RSV testing.  Fact Sheet for Patients: BloggerCourse.com  Fact Sheet for Healthcare Providers: SeriousBroker.it  This test is not yet approved or cleared by the [place] FDA and has been authorized for detection and/or diagnosis of SARS-CoV-2 by FDA under an Emergency Use Authorization (EUA). This EUA will remain in effect (meaning this test can be used) for the duration of the COVID-19 declaration under Section 564(b)(1) of the Act, 21 U.S.C. section 360bbb-3(b)(1), unless the authorization is terminated or revoked.  Performed at [facility], [address], [place], [place] [address]   Urine Culture     Status: Abnormal  Collection Time: 08/24/20 10:27 PM   Specimen: Urine, Clean Catch  Result Value Ref Range Status   Specimen Description   Final    URINE, CLEAN CATCH Performed at [facility], [address] [place], [place] [address]    Special Requests   Final     NONE Performed at [facility], [address] [place], [place] [address]    Culture >=100,000 COLONIES/mL ESCHERICHIA COLI (A)  Final   Report Status 08/27/2020 FINAL  Final   Organism ID, Bacteria ESCHERICHIA COLI (A)  Final      Susceptibility   Escherichia coli - MIC*    AMPICILLIN <=2 SENSITIVE Sensitive     CEFAZOLIN <=4 SENSITIVE Sensitive     CEFEPIME <=0.12 SENSITIVE Sensitive     CEFTRIAXONE <=0.25 SENSITIVE Sensitive     CIPROFLOXACIN <=0.25 SENSITIVE Sensitive     GENTAMICIN <=1 SENSITIVE Sensitive     IMIPENEM <=0.25 SENSITIVE Sensitive     NITROFURANTOIN <=16 SENSITIVE Sensitive     TRIMETH/SULFA <=20 SENSITIVE Sensitive     AMPICILLIN/SULBACTAM <=2 SENSITIVE Sensitive     PIP/TAZO <=4 SENSITIVE Sensitive     * >=100,000 COLONIES/mL ESCHERICHIA COLI  Blood Culture ID Panel (Reflexed)     Status: Abnormal   Collection Time: 08/24/20 10:27 PM  Result Value Ref Range Status   Enterococcus faecalis NOT DETECTED NOT DETECTED Final   Enterococcus Faecium NOT DETECTED NOT DETECTED Final   Listeria monocytogenes NOT DETECTED NOT DETECTED Final   Staphylococcus species NOT DETECTED NOT DETECTED Final   Staphylococcus aureus (BCID) NOT DETECTED NOT DETECTED Final   Staphylococcus epidermidis NOT DETECTED NOT DETECTED Final   Staphylococcus lugdunensis NOT DETECTED NOT DETECTED Final   Streptococcus species NOT DETECTED NOT DETECTED Final   Streptococcus agalactiae NOT DETECTED NOT DETECTED Final   Streptococcus pneumoniae NOT DETECTED NOT DETECTED Final   Streptococcus pyogenes NOT DETECTED NOT DETECTED Final   A.calcoaceticus-baumannii NOT DETECTED NOT DETECTED Final   Bacteroides fragilis NOT DETECTED NOT DETECTED Final   Enterobacterales DETECTED (A) NOT DETECTED Final    Comment: Enterobacterales represent a large order of gram negative bacteria, not a single organism. Refer to culture for further identification. CRITICAL RESULT CALLED TO, READ BACK BY AND  VERIFIED WITH: SUSAN WATSON, PHARMD AT 1119 ON 08/25/20 BY GM    Enterobacter cloacae complex NOT DETECTED NOT DETECTED Final   Escherichia coli NOT DETECTED NOT DETECTED Final   Klebsiella aerogenes NOT DETECTED NOT DETECTED Final   Klebsiella oxytoca NOT DETECTED NOT DETECTED Final   Klebsiella pneumoniae NOT DETECTED NOT DETECTED Final   Proteus species NOT DETECTED NOT DETECTED Final   Salmonella species NOT DETECTED NOT DETECTED Final   Serratia marcescens NOT DETECTED NOT DETECTED Final   Haemophilus influenzae NOT DETECTED NOT DETECTED Final   Neisseria meningitidis NOT DETECTED NOT DETECTED Final   Pseudomonas aeruginosa NOT DETECTED NOT DETECTED Final   Stenotrophomonas maltophilia NOT DETECTED NOT DETECTED Final   Candida albicans NOT DETECTED NOT DETECTED Final   Candida auris NOT DETECTED NOT DETECTED Final   Candida glabrata NOT DETECTED NOT DETECTED Final   Candida krusei NOT DETECTED NOT DETECTED Final   Candida parapsilosis NOT DETECTED NOT DETECTED Final   Candida tropicalis NOT DETECTED NOT DETECTED Final   Cryptococcus neoformans/gattii NOT DETECTED NOT DETECTED Final   CTX-M ESBL NOT DETECTED NOT DETECTED Final   Carbapenem resistance IMP NOT DETECTED NOT DETECTED Final   Carbapenem resistance KPC NOT DETECTED NOT DETECTED Final  Carbapenem resistance NDM NOT DETECTED NOT DETECTED Final   Carbapenem resist OXA 48 LIKE NOT DETECTED NOT DETECTED Final   Carbapenem resistance VIM NOT DETECTED NOT DETECTED Final    Comment: Performed at [facility], [address] [place], [place] [address]  CULTURE, BLOOD (ROUTINE X 2) w Reflex to ID Panel     Status: None (Preliminary result)   Collection Time: 08/26/20  5:49 AM   Specimen: BLOOD  Result Value Ref Range Status   Specimen Description BLOOD BLOOD RIGHT HAND  Final   Special Requests IN PEDIATRIC BOTTLE Blood Culture adequate volume  Final   Culture   Final    NO GROWTH 3 DAYS Performed at [facility], [address] [place], [place] [address]    Report Status PENDING  Incomplete  CULTURE, BLOOD (ROUTINE X 2) w Reflex to ID Panel     Status: None (Preliminary result)   Collection Time: 08/26/20  5:49 AM   Specimen: BLOOD  Result Value Ref Range Status   Specimen Description BLOOD BLOOD LEFT HAND  Final   Special Requests   Final    BOTTLES DRAWN AEROBIC AND ANAEROBIC Blood Culture adequate volume   Culture   Final    NO GROWTH 3 DAYS Performed at [facility], [address] [place], [place] [address]    Report Status PENDING  Incomplete      Management plans discussed with the patient, and she is in agreement.  CODE STATUS:  Code Status History     Date Active Date Inactive Code Status Order ID Comments User Context   08/25/2020 0857 08/29/2020 1642 Full Code [id number]  Lorretta HarpNiu, Xilin, MD Inpatient   09/13/2018 1531 09/14/2018 1808 Full Code [id number]  Tarry KosXu, Naiping M, MD Inpatient   05/22/2018 2004 05/25/2018 1715 Full Code [id number]  Campbell StallMayo, Katy Dodd, MD Inpatient   05/01/2018 0026 05/02/2018 1547 Full Code [id number]  Arnaldo Nataliamond, Michael S, MD Inpatient   05/01/2016 2036 05/04/2016 1944 Full Code [id number]  Clapacs, Jackquline DenmarkJohn T, MD Inpatient      Questions for Most Recent Historical Code Status (Order [id number])        TOTAL TIME TAKING CARE OF THIS PATIENT: 34 minutes.    Alford Highlandichard Jomarie Gellis M.D on 08/29/2020 at 5:10 PM  [facility]  CC: Primary care physician; Rolm GalaIloabachie, Chioma E, NP

## 2020-08-29 NOTE — TOC Initial Note (Signed)
Transition of Care [facility]) - Initial/Assessment Note    Patient Details  Name: Brenda [name] MRN: [id number] Date of Birth: 07-11-1970  [facility]) CM/SW Contact:    Chapman Fitch, RN Phone Number: 08/29/2020, 1:58 PM  Clinical Narrative:                  Patient discharge home today Patient states that she lives at home with her 2 adult children Patient denies issues obtaining to medication Patient states that she does not have a PCP  [facility]ided [facility]  and [facility]   Patient agreeable to substance abuse resources.  Provided resources   Medications to be filled at [facility] .  Patient declines to wait for them to be delivered. States that she will pick them up her self.  MD updated        Patient Goals and CMS Choice        Expected Discharge Plan and Services           Expected Discharge Date: 08/29/20                                    Prior Living Arrangements/Services                       Activities of Daily Living Home Assistive Devices/Equipment: Dan Humphreys (specify type) ADL Screening (condition at time of admission) Patient's cognitive ability adequate to safely complete daily activities?: Yes Is the patient deaf or have difficulty hearing?: No Does the patient have difficulty seeing, even when wearing glasses/contacts?: No Does the patient have difficulty concentrating, remembering, or making decisions?: No Patient able to express need for assistance with ADLs?: Yes Does the patient have difficulty dressing or bathing?: No Independently performs ADLs?: Yes (appropriate for developmental age) Does the patient have difficulty walking or climbing stairs?: No Weakness of Legs: Both Weakness of Arms/Hands: None  Permission Sought/Granted                  Emotional Assessment              Admission diagnosis:  Hypokalemia [E87.6] Hypomagnesemia [E83.42] Back  pain [M54.9] Cellulitis of right hand [L03.113] Acute cystitis without hematuria [N30.00] IV drug abuse (HCC) [F19.10] Osteomyelitis of thoracic spine (HCC) [M46.24] Sepsis (HCC) [A41.9] Chest pain, unspecified type [R07.9] Sepsis, due to unspecified organism, unspecified whether acute organ dysfunction present [facility]) [A41.9] Patient Active Problem List   Diagnosis Date Noted   Citrobacter infection    Hepatic steatosis    Polysubstance abuse (HCC) 08/25/2020   IVDU (intravenous drug user) 08/25/2020   HTN (hypertension) 08/25/2020   Depression with anxiety 08/25/2020   Nausea & vomiting 08/25/2020   Chest pain 08/25/2020   Hypokalemia 08/25/2020   Hypomagnesemia 08/25/2020   Pulmonary nodule 08/25/2020   UTI (urinary tract infection) 08/25/2020   Osteomyelitis of thoracic spine (HCC) 08/25/2020   Cellulitis of right hand 08/25/2020   Bacteremia due to Gram-negative bacteria 08/25/2020   Smoking 02/22/2019   Cough 01/24/2019   Shortness of breath 01/24/2019   Sore throat 01/24/2019   Chest tightness 12/20/2018   History of hepatitis 10/26/2018   Urinary tract infection symptoms 10/26/2018   Sleep apnea 10/26/2018   Back pain 10/26/2018   Abnormal EKG 09/22/2018   History of asthma 09/22/2018   Right hip pain 09/22/2018   Status  post right hip replacement 09/13/2018   Leg edema 08/22/2018   Primary osteoarthritis of right hip 07/27/2018   Vaginal discharge 07/14/2018   Sepsis (HCC) 04/30/2018   Opioid use disorder, moderate, dependence (HCC) 05/02/2016   Tobacco use disorder 05/02/2016   Severe recurrent major depression without psychotic features (HCC) 05/01/2016   Cocaine use disorder, moderate, dependence (HCC) 05/01/2016   Alcohol abuse 05/01/2016   GERD (gastroesophageal reflux disease) 05/01/2016   PCP:  Rolm Gala, NP Pharmacy:   MED[facility] [address] [place], [place] - [address] [place] [address] Phone: [phone] Fax:  [phone]  [facility] [address] [place] [address] Phone: [phone] Fax: [phone]     Social Determinants of Health (SDOH) Interventions    Readmission Risk Interventions Readmission Risk Prevention Plan 05/24/2018  Transportation Screening Complete  HRI or Home Care Consult (No Data)  Palliative Care Screening Not Applicable  Medication Review (RN Care Manager) Complete  Some recent data might be hidden

## 2020-08-29 NOTE — Plan of Care (Signed)
  Problem: Education: Goal: Knowledge of General Education information will improve Description: Including pain rating scale, medication(s)/side effects and non-pharmacologic comfort measures Outcome: Adequate for Discharge   Problem: Health Behavior/Discharge Planning: Goal: Ability to manage health-related needs will improve Outcome: Adequate for Discharge   Problem: Clinical Measurements: Goal: Ability to maintain clinical measurements within normal limits will improve Outcome: Adequate for Discharge Goal: Will remain free from infection Outcome: Adequate for Discharge Goal: Diagnostic test results will improve Outcome: Adequate for Discharge Goal: Respiratory complications will improve Outcome: Adequate for Discharge Goal: Cardiovascular complication will be avoided Outcome: Adequate for Discharge   Problem: Activity: Goal: Risk for activity intolerance will decrease Outcome: Adequate for Discharge   Problem: Nutrition: Goal: Adequate nutrition will be maintained Outcome: Adequate for Discharge   Problem: Coping: Goal: Level of anxiety will decrease Outcome: Adequate for Discharge   Problem: Elimination: Goal: Will not experience complications related to bowel motility Outcome: Adequate for Discharge Goal: Will not experience complications related to urinary retention Outcome: Adequate for Discharge   Problem: Pain Managment: Goal: General experience of comfort will improve Outcome: Adequate for Discharge   Problem: Safety: Goal: Ability to remain free from injury will improve Outcome: Adequate for Discharge   Problem: Skin Integrity: Goal: Risk for impaired skin integrity will decrease Outcome: Adequate for Discharge   Problem: Clinical Measurements: Goal: Ability to avoid or minimize complications of infection will improve Outcome: Adequate for Discharge   Problem: Skin Integrity: Goal: Skin integrity will improve Outcome: Adequate for Discharge   

## 2020-08-31 LAB — CULTURE, BLOOD (ROUTINE X 2)
Culture: NO GROWTH
Culture: NO GROWTH
Special Requests: ADEQUATE
Special Requests: ADEQUATE

## 2020-09-04 ENCOUNTER — Other Ambulatory Visit: Payer: Self-pay

## 2020-09-18 ENCOUNTER — Inpatient Hospital Stay: Payer: Disability Insurance | Admitting: Infectious Diseases

## 2020-09-27 ENCOUNTER — Other Ambulatory Visit: Payer: Self-pay

## 2020-09-27 MED ORDER — FLUOXETINE HCL 10 MG PO CAPS
ORAL_CAPSULE | ORAL | 1 refills | Status: DC
  Filled 2020-09-27: qty 30, 30d supply, fill #0
  Filled 2020-10-22: qty 30, 30d supply, fill #1
  Filled 2020-11-26: qty 30, 30d supply, fill #2
  Filled 2021-01-03: qty 30, 30d supply, fill #3
  Filled 2021-02-01: qty 30, 30d supply, fill #4

## 2020-09-27 MED ORDER — IBUPROFEN 800 MG PO TABS
ORAL_TABLET | ORAL | 1 refills | Status: DC
  Filled 2020-09-27: qty 90, 30d supply, fill #0
  Filled 2020-10-22: qty 90, 30d supply, fill #1

## 2020-09-27 MED ORDER — METHOCARBAMOL 750 MG PO TABS
ORAL_TABLET | ORAL | 0 refills | Status: DC
  Filled 2020-09-27: qty 60, 30d supply, fill #0
  Filled 2020-10-22: qty 30, 15d supply, fill #1

## 2020-09-27 MED ORDER — CIPROFLOXACIN HCL 500 MG PO TABS
ORAL_TABLET | ORAL | 0 refills | Status: DC
  Filled 2020-09-27 (×2): qty 42, fill #0
  Filled 2020-10-04: qty 42, 14d supply, fill #0

## 2020-10-03 ENCOUNTER — Other Ambulatory Visit: Payer: Self-pay

## 2020-10-03 ENCOUNTER — Ambulatory Visit: Payer: Disability Insurance | Admitting: Pharmacy Technician

## 2020-10-03 DIAGNOSIS — Z79899 Other long term (current) drug therapy: Secondary | ICD-10-CM

## 2020-10-04 ENCOUNTER — Other Ambulatory Visit: Payer: Self-pay

## 2020-10-04 MED ORDER — QUETIAPINE FUMARATE 25 MG PO TABS
25.0000 mg | ORAL_TABLET | Freq: Every day | ORAL | 1 refills | Status: DC
  Filled 2020-10-04 (×2): qty 30, 30d supply, fill #0
  Filled 2020-10-31: qty 30, 30d supply, fill #1

## 2020-10-04 NOTE — Progress Notes (Signed)
Completed [facility] application and contract.  Patient agreed to all terms of the [facility] contract.    Patient approved to receive medication assistance at [facility] until time for re-certification in 2023, and as long as eligibility criteria continues to be met.    Provided patient with community resource material based on her particular needs.    Ralene Gasparyan J. Stefania Goulart Care Manager [facility]  

## 2020-10-10 ENCOUNTER — Ambulatory Visit: Payer: Disability Insurance

## 2020-10-17 ENCOUNTER — Ambulatory Visit: Payer: Disability Insurance

## 2020-10-22 ENCOUNTER — Other Ambulatory Visit: Payer: Self-pay

## 2020-10-31 ENCOUNTER — Other Ambulatory Visit: Payer: Self-pay

## 2020-11-14 ENCOUNTER — Ambulatory Visit: Payer: Disability Insurance

## 2020-11-26 ENCOUNTER — Other Ambulatory Visit: Payer: Self-pay

## 2020-11-27 ENCOUNTER — Other Ambulatory Visit: Payer: Self-pay

## 2020-11-27 MED ORDER — IBUPROFEN 800 MG PO TABS
ORAL_TABLET | ORAL | 1 refills | Status: DC
  Filled 2020-11-27: qty 90, 30d supply, fill #0
  Filled 2021-01-03: qty 90, 30d supply, fill #1

## 2020-11-27 MED ORDER — QUETIAPINE FUMARATE 25 MG PO TABS
25.0000 mg | ORAL_TABLET | Freq: Every day | ORAL | 1 refills | Status: DC
  Filled 2020-11-27: qty 30, 30d supply, fill #0
  Filled 2021-01-03: qty 30, 30d supply, fill #1

## 2020-11-27 MED ORDER — METHOCARBAMOL 750 MG PO TABS
ORAL_TABLET | ORAL | 1 refills | Status: DC
  Filled 2020-11-27: qty 60, 30d supply, fill #0
  Filled 2021-01-03: qty 60, 30d supply, fill #1
  Filled 2021-02-01: qty 60, 30d supply, fill #2

## 2020-12-04 NOTE — Progress Notes (Unsigned)
Patient pre-screened for [facility] eligibility due to COVID 19 precautions. Two patient identifiers used for verification that I was speaking to correct patient.  Patient will  call back to schedule appointment when she gets her car fixed.  Offered taxi service, patient declined.  Paperwork complete.

## 2020-12-05 ENCOUNTER — Ambulatory Visit: Payer: Disability Insurance

## 2021-01-03 ENCOUNTER — Other Ambulatory Visit: Payer: Self-pay

## 2021-02-01 ENCOUNTER — Other Ambulatory Visit: Payer: Self-pay

## 2021-02-04 ENCOUNTER — Other Ambulatory Visit: Payer: Self-pay

## 2021-02-04 MED ORDER — QUETIAPINE FUMARATE 25 MG PO TABS
25.0000 mg | ORAL_TABLET | Freq: Every day | ORAL | 1 refills | Status: DC
  Filled 2021-02-04: qty 30, 30d supply, fill #0

## 2021-02-04 MED ORDER — IBUPROFEN 800 MG PO TABS
ORAL_TABLET | ORAL | 1 refills | Status: DC
  Filled 2021-02-04: qty 90, 30d supply, fill #0
  Filled 2021-03-06: qty 90, 30d supply, fill #1

## 2021-02-05 ENCOUNTER — Other Ambulatory Visit: Payer: Self-pay

## 2021-02-05 MED ORDER — SPIRIVA RESPIMAT 1.25 MCG/ACT IN AERS: INHALATION_SPRAY | RESPIRATORY_TRACT | 3 refills | Status: AC

## 2021-02-05 MED ORDER — ESOMEPRAZOLE MAGNESIUM 40 MG PO CPDR
40.0000 mg | DELAYED_RELEASE_CAPSULE | Freq: Every day | ORAL | 3 refills | Status: AC
  Filled 2021-02-05: qty 90, 90d supply, fill #0
  Filled 2021-05-08: qty 90, 90d supply, fill #1

## 2021-02-05 MED ORDER — ALBUTEROL SULFATE HFA 108 (90 BASE) MCG/ACT IN AERS
2.0000 | INHALATION_SPRAY | Freq: Four times a day (QID) | RESPIRATORY_TRACT | 4 refills | Status: AC | PRN
  Filled 2021-02-05: qty 8.5, 25d supply, fill #0

## 2021-02-19 ENCOUNTER — Other Ambulatory Visit: Payer: Self-pay

## 2021-02-20 ENCOUNTER — Other Ambulatory Visit: Payer: Self-pay

## 2021-02-25 ENCOUNTER — Other Ambulatory Visit: Payer: Self-pay

## 2021-02-28 ENCOUNTER — Other Ambulatory Visit: Payer: Self-pay

## 2021-02-28 MED ORDER — QUETIAPINE FUMARATE 25 MG PO TABS: 25.0000 mg | ORAL_TABLET | Freq: Every day | ORAL | 2 refills | Status: DC

## 2021-02-28 MED ORDER — FLUOXETINE HCL 20 MG PO CAPS
20.0000 mg | ORAL_CAPSULE | Freq: Every day | ORAL | 2 refills | Status: DC
  Filled 2021-02-28: qty 30, 30d supply, fill #0
  Filled 2021-04-05: qty 30, 30d supply, fill #1
  Filled 2021-05-08: qty 30, 30d supply, fill #2
  Filled 2021-06-05: qty 30, 30d supply, fill #3

## 2021-02-28 MED ORDER — FLUOXETINE HCL 10 MG PO CAPS: ORAL_CAPSULE | ORAL | 2 refills | Status: DC

## 2021-02-28 MED ORDER — QUETIAPINE FUMARATE 50 MG PO TABS
50.0000 mg | ORAL_TABLET | Freq: Every day | ORAL | 2 refills | Status: DC
  Filled 2021-02-28: qty 30, 30d supply, fill #0
  Filled 2021-04-05: qty 30, 30d supply, fill #1
  Filled 2021-05-08: qty 30, 30d supply, fill #2
  Filled 2021-06-05: qty 30, 30d supply, fill #3

## 2021-03-01 ENCOUNTER — Other Ambulatory Visit: Payer: Self-pay

## 2021-03-06 ENCOUNTER — Other Ambulatory Visit: Payer: Self-pay

## 2021-03-07 ENCOUNTER — Other Ambulatory Visit: Payer: Self-pay

## 2021-03-07 MED ORDER — METHOCARBAMOL 750 MG PO TABS
ORAL_TABLET | ORAL | 1 refills | Status: DC
  Filled 2021-03-07: qty 60, 30d supply, fill #0
  Filled 2021-04-05: qty 60, 30d supply, fill #1
  Filled 2021-05-08: qty 60, 30d supply, fill #2

## 2021-03-08 ENCOUNTER — Other Ambulatory Visit: Payer: Self-pay

## 2021-03-13 ENCOUNTER — Other Ambulatory Visit: Payer: Self-pay

## 2021-04-02 ENCOUNTER — Other Ambulatory Visit: Payer: Self-pay

## 2021-04-03 ENCOUNTER — Other Ambulatory Visit: Payer: Self-pay

## 2021-04-04 ENCOUNTER — Other Ambulatory Visit: Payer: Self-pay

## 2021-04-04 MED ORDER — IBUPROFEN 800 MG PO TABS
ORAL_TABLET | ORAL | 1 refills | Status: DC
  Filled 2021-04-04: qty 90, 30d supply, fill #0
  Filled 2021-05-08: qty 90, 30d supply, fill #1

## 2021-04-05 ENCOUNTER — Other Ambulatory Visit: Payer: Self-pay

## 2021-05-08 ENCOUNTER — Other Ambulatory Visit: Payer: Self-pay

## 2021-05-31 ENCOUNTER — Emergency Department: Payer: Self-pay

## 2021-05-31 ENCOUNTER — Other Ambulatory Visit: Payer: Self-pay

## 2021-05-31 ENCOUNTER — Inpatient Hospital Stay: Payer: Self-pay

## 2021-05-31 ENCOUNTER — Inpatient Hospital Stay
Admission: EM | Admit: 2021-05-31 | Discharge: 2021-06-05 | DRG: 871 | Disposition: A | Discharge status: Home / Self Care | Payer: Self-pay | Attending: Osteopathic Medicine | Admitting: Osteopathic Medicine

## 2021-05-31 ENCOUNTER — Encounter: Payer: Self-pay | Admitting: Intensive Care

## 2021-05-31 DIAGNOSIS — E876 Hypokalemia: Secondary | ICD-10-CM | POA: Diagnosis not present

## 2021-05-31 DIAGNOSIS — A419 Sepsis, unspecified organism: Principal | ICD-10-CM | POA: Diagnosis present

## 2021-05-31 DIAGNOSIS — Z20822 Contact with and (suspected) exposure to covid-19: Secondary | ICD-10-CM | POA: Diagnosis present

## 2021-05-31 DIAGNOSIS — I5032 Chronic diastolic (congestive) heart failure: Secondary | ICD-10-CM | POA: Diagnosis present

## 2021-05-31 DIAGNOSIS — R7989 Other specified abnormal findings of blood chemistry: Secondary | ICD-10-CM | POA: Diagnosis present

## 2021-05-31 DIAGNOSIS — F1420 Cocaine dependence, uncomplicated: Secondary | ICD-10-CM | POA: Diagnosis present

## 2021-05-31 DIAGNOSIS — J189 Pneumonia, unspecified organism: Secondary | ICD-10-CM | POA: Diagnosis present

## 2021-05-31 DIAGNOSIS — F17200 Nicotine dependence, unspecified, uncomplicated: Secondary | ICD-10-CM | POA: Diagnosis present

## 2021-05-31 DIAGNOSIS — F1010 Alcohol abuse, uncomplicated: Secondary | ICD-10-CM | POA: Diagnosis present

## 2021-05-31 DIAGNOSIS — F17210 Nicotine dependence, cigarettes, uncomplicated: Secondary | ICD-10-CM | POA: Diagnosis present

## 2021-05-31 DIAGNOSIS — J188 Other pneumonia, unspecified organism: Secondary | ICD-10-CM | POA: Diagnosis present

## 2021-05-31 DIAGNOSIS — Z6841 Body Mass Index (BMI) 40.0 and over, adult: Secondary | ICD-10-CM

## 2021-05-31 DIAGNOSIS — J9601 Acute respiratory failure with hypoxia: Secondary | ICD-10-CM | POA: Diagnosis present

## 2021-05-31 DIAGNOSIS — F419 Anxiety disorder, unspecified: Secondary | ICD-10-CM | POA: Diagnosis present

## 2021-05-31 DIAGNOSIS — H9190 Unspecified hearing loss, unspecified ear: Secondary | ICD-10-CM | POA: Diagnosis present

## 2021-05-31 DIAGNOSIS — I110 Hypertensive heart disease with heart failure: Secondary | ICD-10-CM | POA: Diagnosis present

## 2021-05-31 DIAGNOSIS — Z96641 Presence of right artificial hip joint: Secondary | ICD-10-CM | POA: Diagnosis present

## 2021-05-31 DIAGNOSIS — M4854XA Collapsed vertebra, not elsewhere classified, thoracic region, initial encounter for fracture: Secondary | ICD-10-CM | POA: Diagnosis present

## 2021-05-31 DIAGNOSIS — J449 Chronic obstructive pulmonary disease, unspecified: Secondary | ICD-10-CM | POA: Diagnosis present

## 2021-05-31 DIAGNOSIS — F1910 Other psychoactive substance abuse, uncomplicated: Secondary | ICD-10-CM | POA: Diagnosis present

## 2021-05-31 DIAGNOSIS — B182 Chronic viral hepatitis C: Secondary | ICD-10-CM | POA: Diagnosis present

## 2021-05-31 DIAGNOSIS — M4644 Discitis, unspecified, thoracic region: Secondary | ICD-10-CM | POA: Diagnosis present

## 2021-05-31 DIAGNOSIS — I959 Hypotension, unspecified: Secondary | ICD-10-CM | POA: Diagnosis present

## 2021-05-31 DIAGNOSIS — I248 Other forms of acute ischemic heart disease: Secondary | ICD-10-CM | POA: Diagnosis present

## 2021-05-31 DIAGNOSIS — K219 Gastro-esophageal reflux disease without esophagitis: Secondary | ICD-10-CM | POA: Diagnosis present

## 2021-05-31 DIAGNOSIS — F418 Other specified anxiety disorders: Secondary | ICD-10-CM | POA: Diagnosis present

## 2021-05-31 DIAGNOSIS — F319 Bipolar disorder, unspecified: Secondary | ICD-10-CM | POA: Diagnosis present

## 2021-05-31 DIAGNOSIS — R778 Other specified abnormalities of plasma proteins: Secondary | ICD-10-CM | POA: Diagnosis present

## 2021-05-31 DIAGNOSIS — N390 Urinary tract infection, site not specified: Secondary | ICD-10-CM | POA: Diagnosis present

## 2021-05-31 DIAGNOSIS — I10 Essential (primary) hypertension: Secondary | ICD-10-CM | POA: Diagnosis present

## 2021-05-31 DIAGNOSIS — J440 Chronic obstructive pulmonary disease with acute lower respiratory infection: Secondary | ICD-10-CM | POA: Diagnosis present

## 2021-05-31 DIAGNOSIS — R079 Chest pain, unspecified: Secondary | ICD-10-CM | POA: Diagnosis present

## 2021-05-31 HISTORY — DX: Chronic obstructive pulmonary disease, unspecified: J44.9

## 2021-05-31 LAB — URINE DRUG SCREEN, QUALITATIVE (ARMC ONLY)
Amphetamines, Ur Screen: POSITIVE — AB
Barbiturates, Ur Screen: NOT DETECTED
Benzodiazepine, Ur Scrn: POSITIVE — AB
Cannabinoid 50 Ng, Ur ~~LOC~~: NOT DETECTED
Cocaine Metabolite,Ur ~~LOC~~: NOT DETECTED
MDMA (Ecstasy)Ur Screen: NOT DETECTED
Methadone Scn, Ur: NOT DETECTED
Opiate, Ur Screen: NOT DETECTED
Phencyclidine (PCP) Ur S: NOT DETECTED
Tricyclic, Ur Screen: NOT DETECTED

## 2021-05-31 LAB — COMPREHENSIVE METABOLIC PANEL
ALT: 34 U/L (ref 0–44)
AST: 44 U/L — ABNORMAL HIGH (ref 15–41)
Albumin: 3.3 g/dL — ABNORMAL LOW (ref 3.5–5.0)
Alkaline Phosphatase: 113 U/L (ref 38–126)
Anion gap: 11 (ref 5–15)
BUN: 11 mg/dL (ref 6–20)
CO2: 21 mmol/L — ABNORMAL LOW (ref 22–32)
Calcium: 8.6 mg/dL — ABNORMAL LOW (ref 8.9–10.3)
Chloride: 103 mmol/L (ref 98–111)
Creatinine, Ser: 0.9 mg/dL (ref 0.44–1.00)
GFR, Estimated: >60 mL/min (ref >60)
Glucose, Bld: 135 mg/dL — ABNORMAL HIGH (ref 70–99)
Potassium: 3 mmol/L — ABNORMAL LOW (ref 3.5–5.1)
Sodium: 135 mmol/L (ref 135–145)
Total Bilirubin: 0.5 mg/dL (ref 0.3–1.2)
Total Protein: 7.3 g/dL (ref 6.5–8.1)

## 2021-05-31 LAB — URINALYSIS, COMPLETE (UACMP) WITH MICROSCOPIC
Bilirubin Urine: NEGATIVE
Glucose, UA: NEGATIVE mg/dL
Hgb urine dipstick: NEGATIVE
Ketones, ur: NEGATIVE mg/dL
Nitrite: NEGATIVE
Protein, ur: 100 mg/dL — AB
Specific Gravity, Urine: 1.024 (ref 1.005–1.030)
pH: 5 (ref 5.0–8.0)

## 2021-05-31 LAB — LACTIC ACID, PLASMA
Lactic Acid, Venous: 1.4 mmol/L (ref 0.5–1.9)
Lactic Acid, Venous: 1.2 mmol/L (ref 0.5–1.9)

## 2021-05-31 LAB — CBC
HCT: 42.5 % (ref 36.0–46.0)
Hemoglobin: 12.9 g/dL (ref 12.0–15.0)
MCH: 21.4 pg — ABNORMAL LOW (ref 26.0–34.0)
MCHC: 30.4 g/dL (ref 30.0–36.0)
MCV: 70.6 fL — ABNORMAL LOW (ref 80.0–100.0)
Platelets: 267 10*3/uL (ref 150–400)
RBC: 6.02 MIL/uL — ABNORMAL HIGH (ref 3.87–5.11)
RDW: 20.8 % — ABNORMAL HIGH (ref 11.5–15.5)
WBC: 14.7 10*3/uL — ABNORMAL HIGH (ref 4.0–10.5)
nRBC: 0 % (ref 0.0–0.2)

## 2021-05-31 LAB — BLOOD GAS, VENOUS
Acid-base deficit: 1.1 mmol/L (ref 0.0–2.0)
Bicarbonate: 25.3 mmol/L (ref 20.0–28.0)
O2 Saturation: 92.1 %
Patient temperature: 37
pCO2, Ven: 48 mmHg (ref 44–60)
pH, Ven: 7.33 (ref 7.25–7.43)
pO2, Ven: 64 mmHg — ABNORMAL HIGH (ref 32–45)

## 2021-05-31 LAB — RESP PANEL BY RT-PCR (FLU A&B, COVID) ARPGX2
Influenza A by PCR: NEGATIVE
Influenza B by PCR: NEGATIVE
SARS Coronavirus 2 by RT PCR: NEGATIVE

## 2021-05-31 LAB — TROPONIN I (HIGH SENSITIVITY)
Troponin I (High Sensitivity): 330 ng/L (ref <18)
Troponin I (High Sensitivity): 243 ng/L (ref <18)
Troponin I (High Sensitivity): 252 ng/L (ref <18)

## 2021-05-31 LAB — LIPID PANEL
Cholesterol: 98 mg/dL (ref 0–200)
HDL: 28 mg/dL — ABNORMAL LOW (ref >40)
LDL Cholesterol: 50 mg/dL (ref 0–99)
Total CHOL/HDL Ratio: 3.5 RATIO
Triglycerides: 99 mg/dL (ref <150)
VLDL: 20 mg/dL (ref 0–40)

## 2021-05-31 LAB — MAGNESIUM: Magnesium: 1.9 mg/dL (ref 1.7–2.4)

## 2021-05-31 LAB — PROCALCITONIN: Procalcitonin: 2.15 ng/mL

## 2021-05-31 LAB — BRAIN NATRIURETIC PEPTIDE: B Natriuretic Peptide: 213.3 pg/mL — ABNORMAL HIGH (ref 0.0–100.0)

## 2021-05-31 LAB — SEDIMENTATION RATE: Sed Rate: 44 mm/hr — ABNORMAL HIGH (ref 0–20)

## 2021-05-31 LAB — D-DIMER, QUANTITATIVE: D-Dimer, Quant: 1.73 ug/mL-FEU — ABNORMAL HIGH (ref 0.00–0.50)

## 2021-05-31 LAB — POC URINE PREG, ED: Preg Test, Ur: NEGATIVE

## 2021-05-31 LAB — STREP PNEUMONIAE URINARY ANTIGEN: Strep Pneumo Urinary Antigen: NEGATIVE

## 2021-05-31 IMAGING — DX DG KNEE COMPLETE 4+V*L*
4 series · 4 of 4 positions shown · non-contrast
Comparison: None Available.

CLINICAL DATA: One month of left knee pain.

EXAM:
LEFT KNEE - COMPLETE 4+ VIEW

[knee ap]
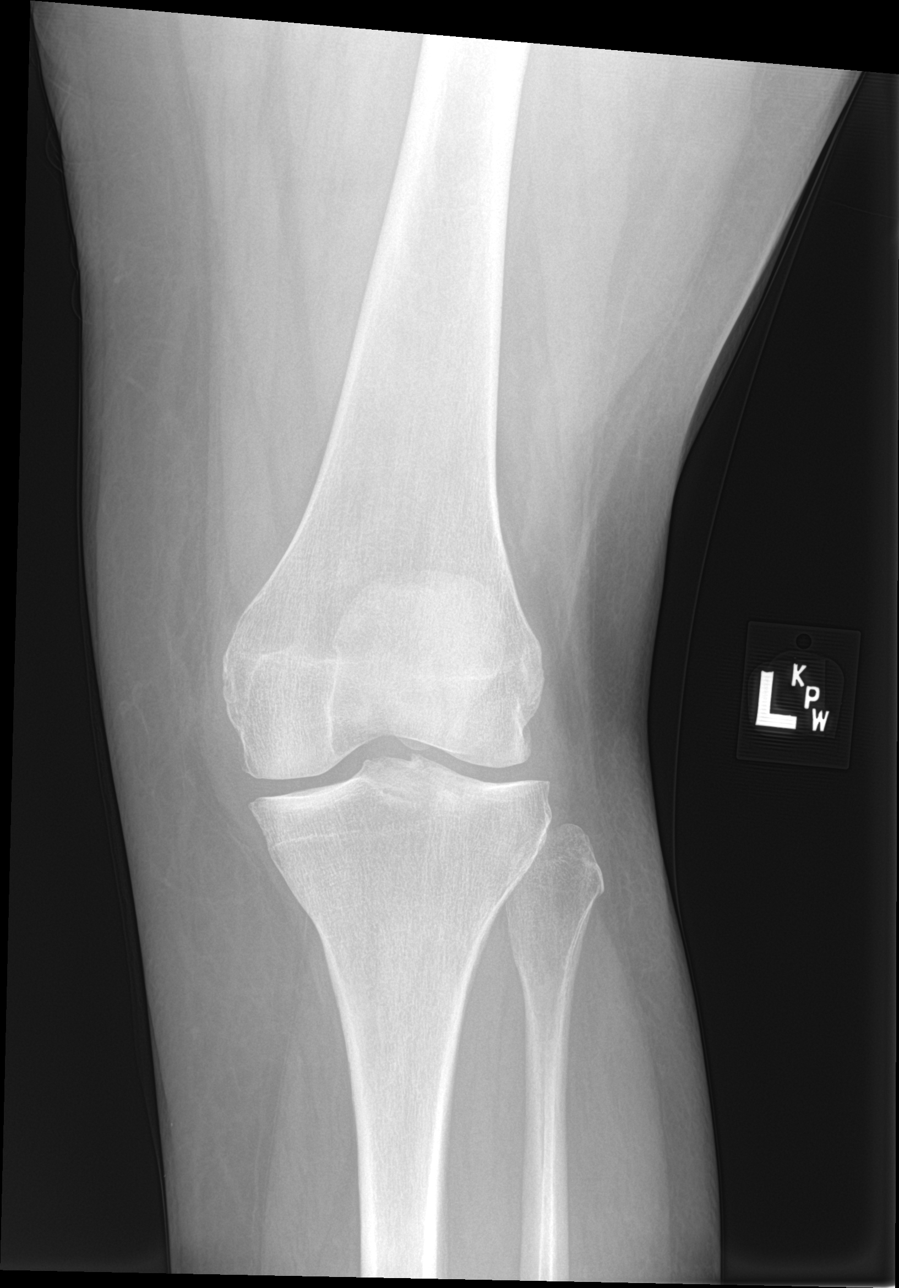

[knee lat]
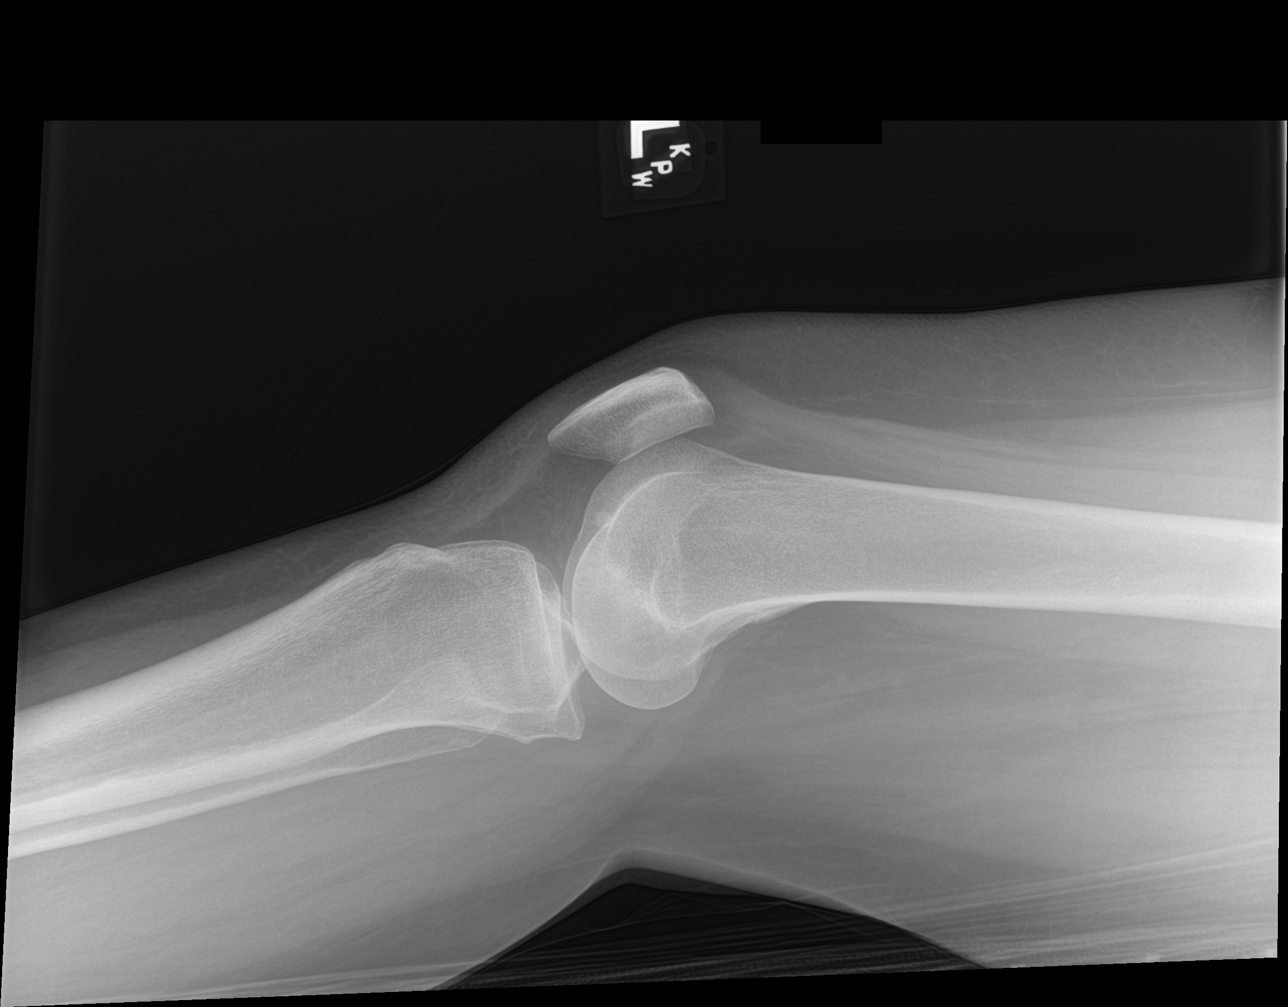

[knee obl (1 of 2)]
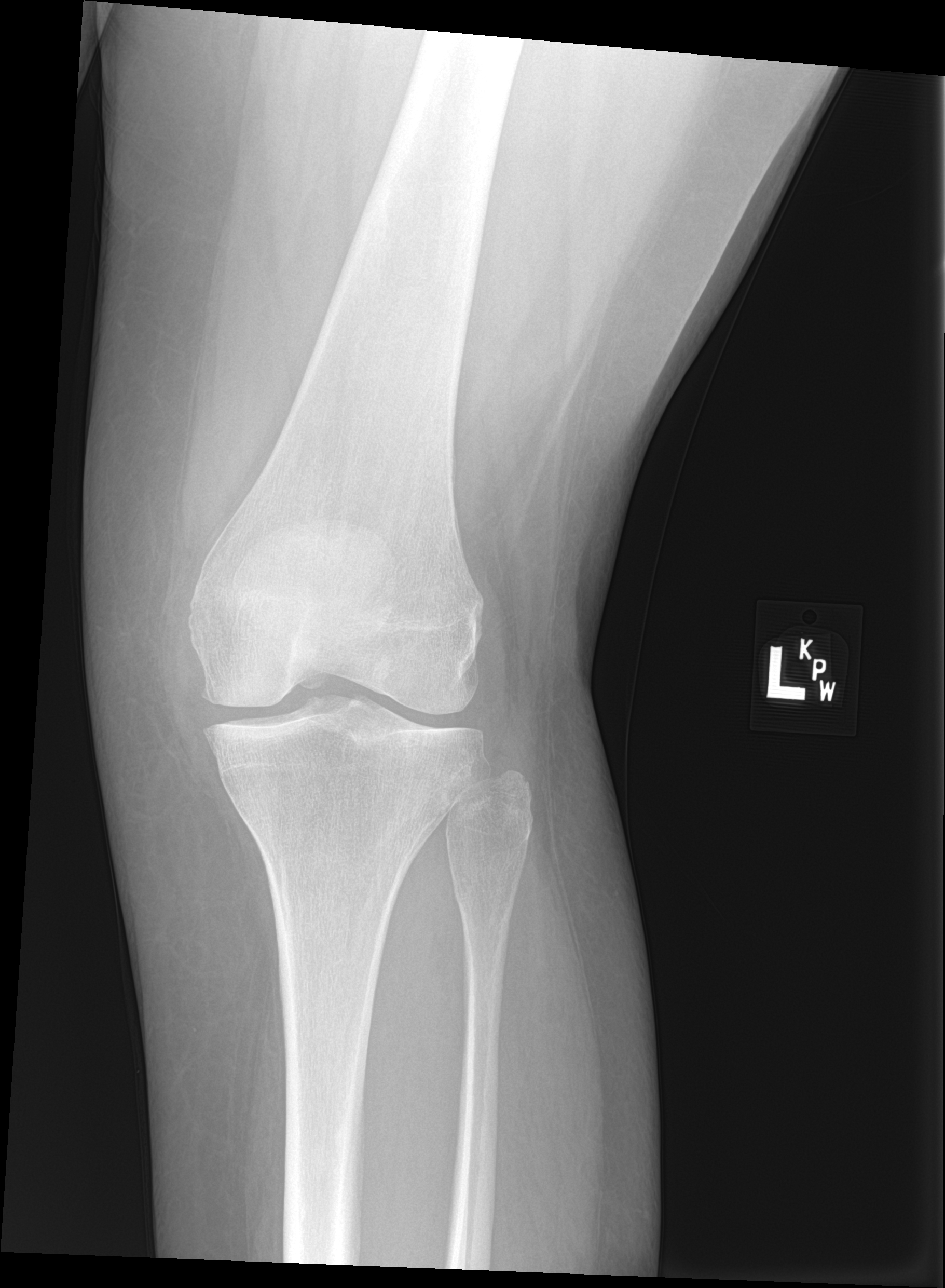

[knee obl (2 of 2)]
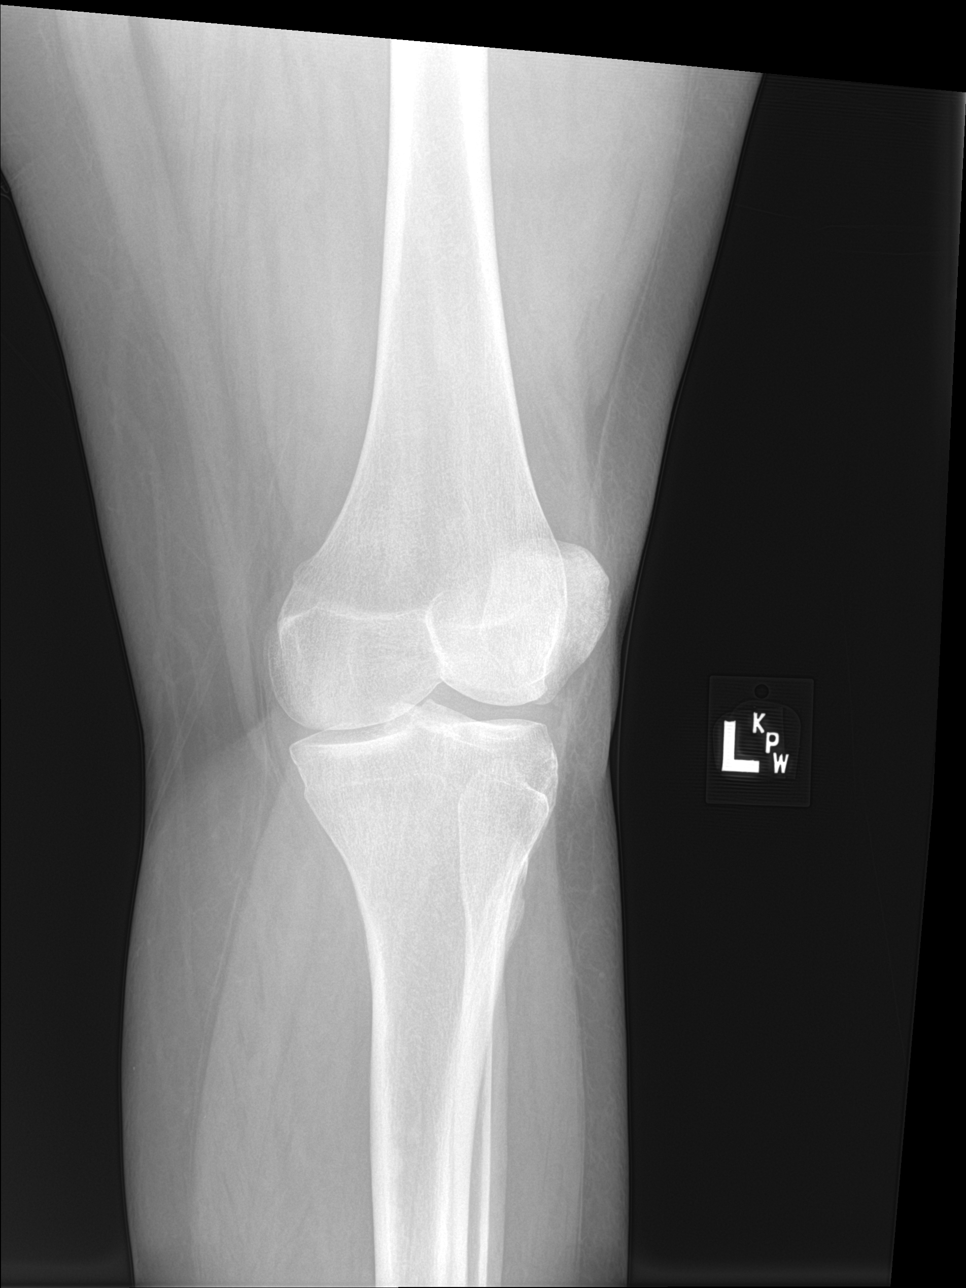

[4 of 4 positions shown; findings below may reference images not displayed]

FINDINGS: No evidence of fracture, dislocation, or significant joint effusion.
Very mild tricompartment degenerative spurring without significant
joint space loss. Soft tissues are unremarkable.
IMPRESSION: No fracture or dislocation of the left knee.

Very mild tricompartment DJD.

## 2021-05-31 IMAGING — DX DG CHEST 1V PORT
1 series · 1 of 1 positions shown · non-contrast
Comparison: Chest radiograph [DATE] and chest CTA [DATE]

CLINICAL DATA: Shortness of breath.

EXAM:
PORTABLE CHEST 1 VIEW

[chest ap]
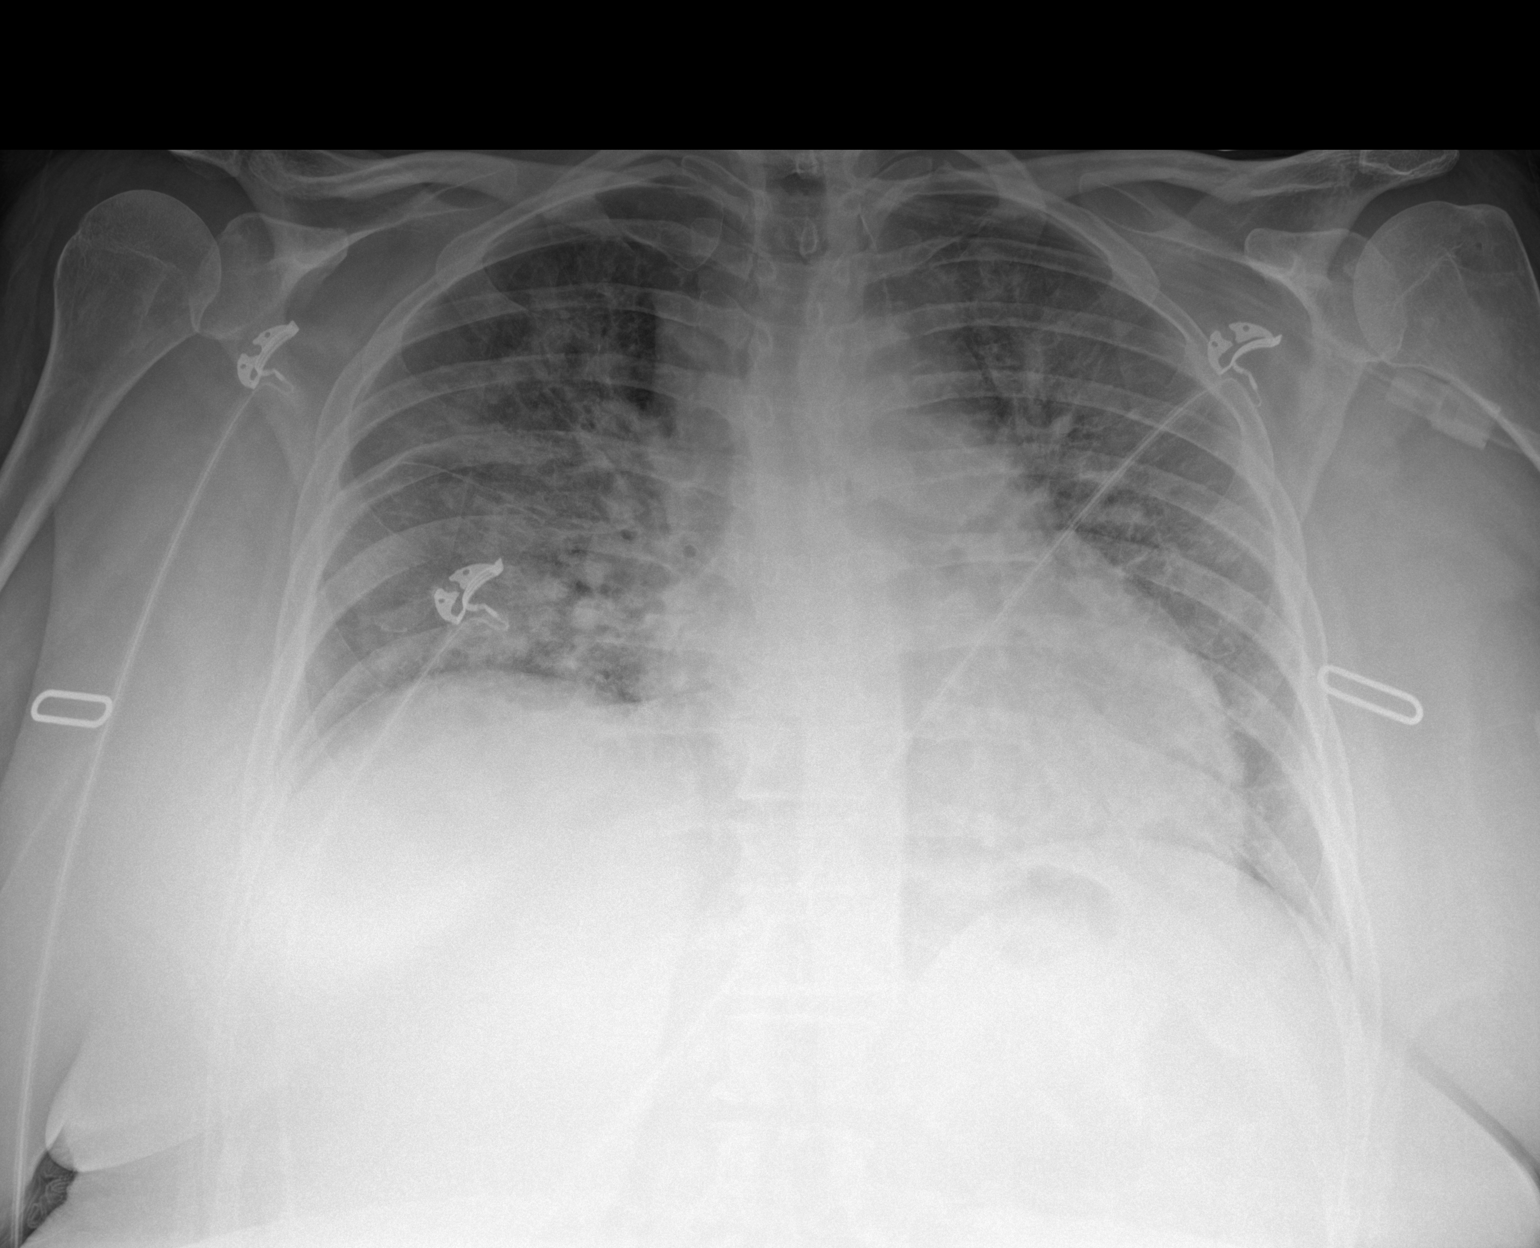

[1 of 1 positions shown; findings below may reference images not displayed]

FINDINGS: The cardiomediastinal silhouette is within normal limits for
portable AP technique and degree of lung inflation. The lungs are
hypoinflated with mild elevation of the right hemidiaphragm. There
are patchy airspace opacities in the right greater than left mid and
lower lungs. No sizable pleural effusion or pneumothorax is
identified. No acute osseous abnormality is seen.
IMPRESSION: Patchy bilateral airspace opacities concerning for pneumonia or
edema.

## 2021-05-31 IMAGING — MR MR LUMBAR SPINE WO/W CM
6 of 7 series · 32 of 48 positions shown · IV contrast (gadavist)
Comparison: MRI [DATE]

CLINICAL DATA: Lumbar radiculopathy, infection suspected

EXAM:
MRI LUMBAR SPINE WITHOUT AND WITH CONTRAST
TECHNIQUE: Multiplanar and multiecho pulse sequences of the lumbar spine were
obtained without and with intravenous contrast.
CONTRAST:  10mL GADAVIST GADOBUTROL 1 MMOL/ML IV SOLN

[Series 1: T2 · sagittal · 4.0mm · 0.75mm/px · 5 of 15 slices shown (1 of 2)]
[im 1/15]
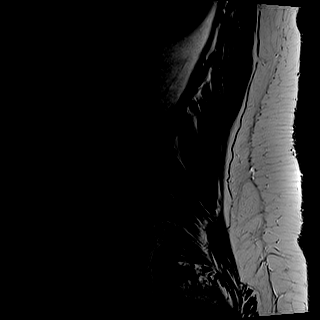
[im 4/15]
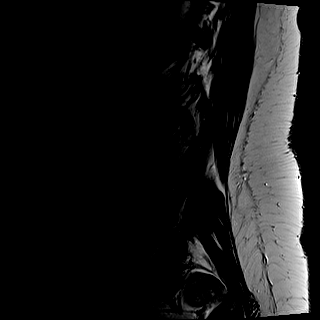
[im 8/15]
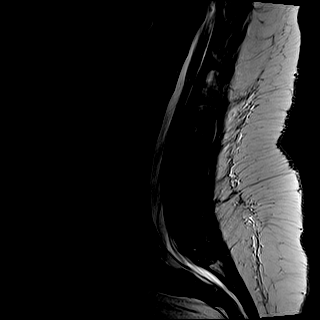
[im 11/15]
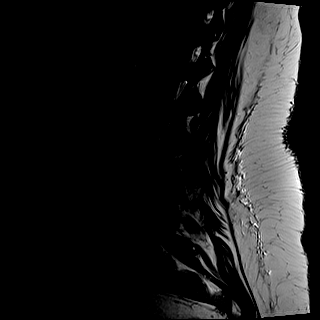
[im 15/15]
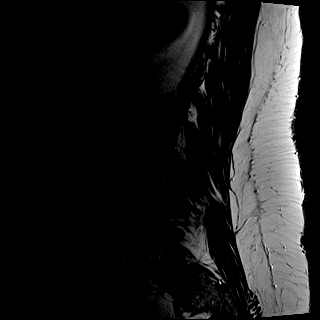

[Series 2: T1 · sagittal · 4.0mm · 0.75mm/px · 5 of 15 slices shown (1 of 2)]
[im 1/15]
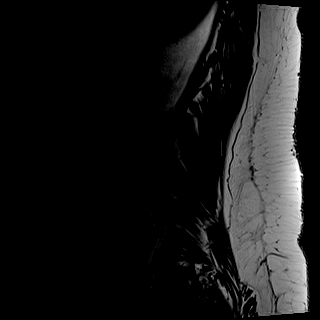
[im 4/15]
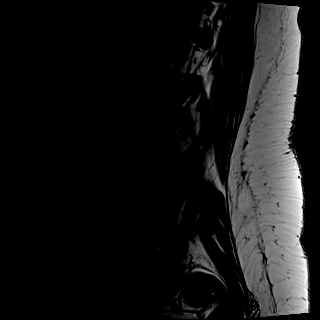
[im 8/15]
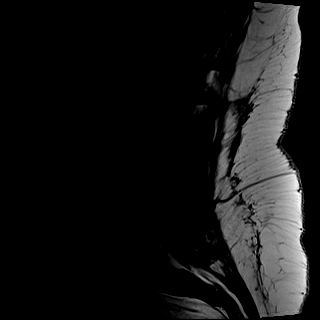
[im 11/15]
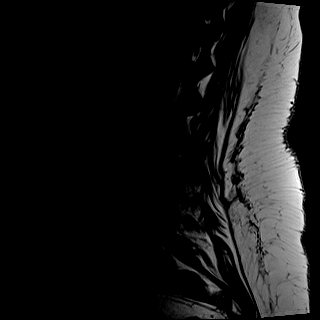
[im 15/15]
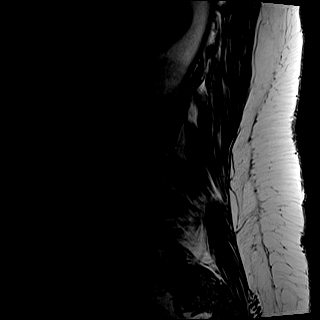

[Series 3: STIR · sagittal · 4.0mm · 0.38mm/px · 2 of 15 slices shown]
[im 1/15]
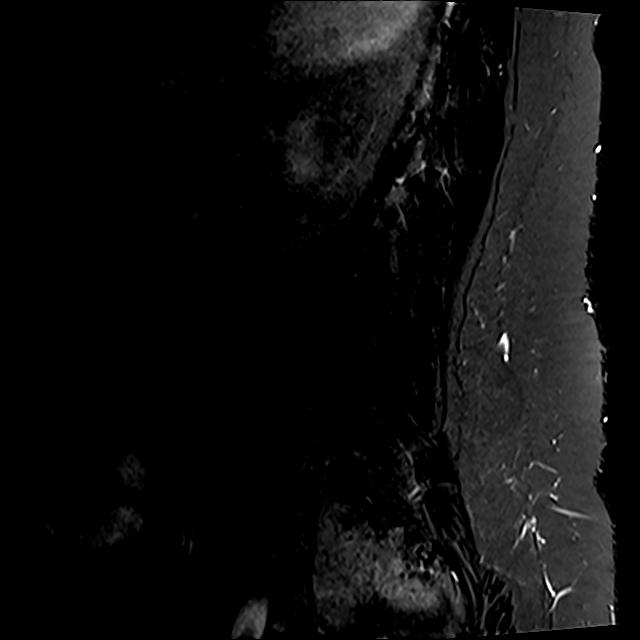
[im 5/15]
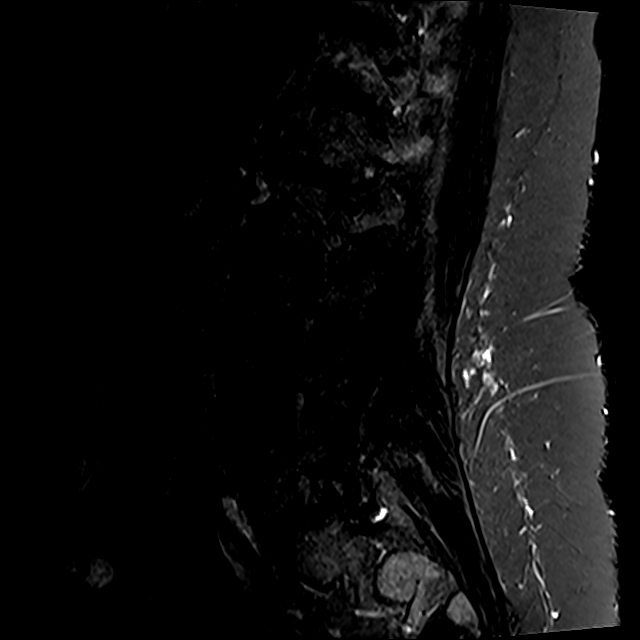

[Series 4: T2 · axial · 4.0mm · 0.78mm/px · z∈[-516,-295]mm · 8 of 35 slices shown (2 of 2)]
[im 1/35]
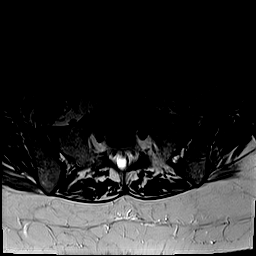
[im 4/35]
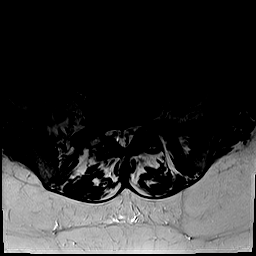
[im 12/35]
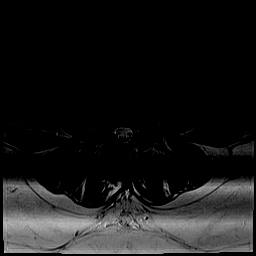
[im 16/35]
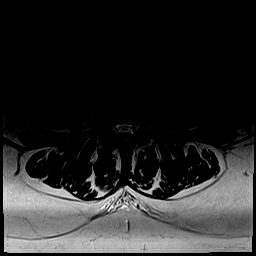
[im 19/35]
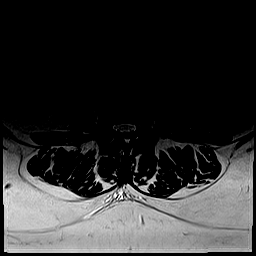
[im 23/35]
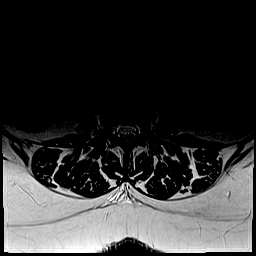
[im 31/35]
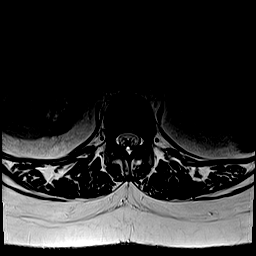
[im 35/35]
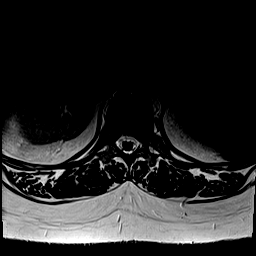

[Series 5: T1 · axial · 4.0mm · 0.39mm/px · z∈[-516,-295]mm · 8 of 35 slices shown (2 of 2)]
[im 1/35]
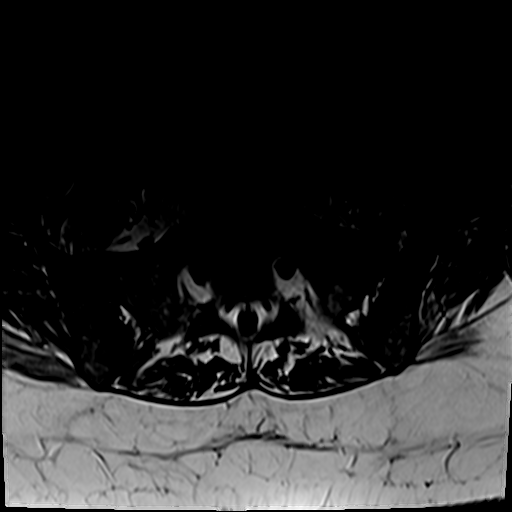
[im 4/35]
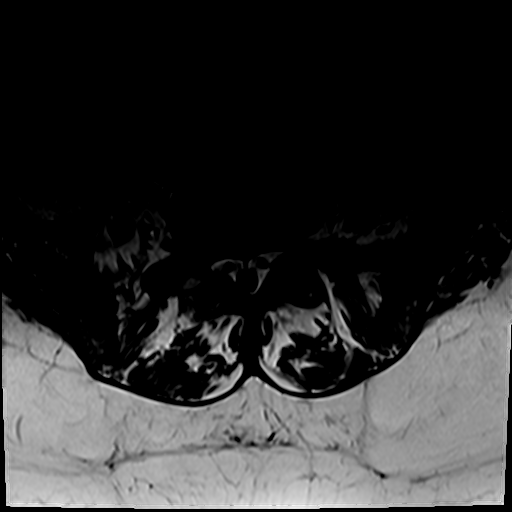
[im 12/35]
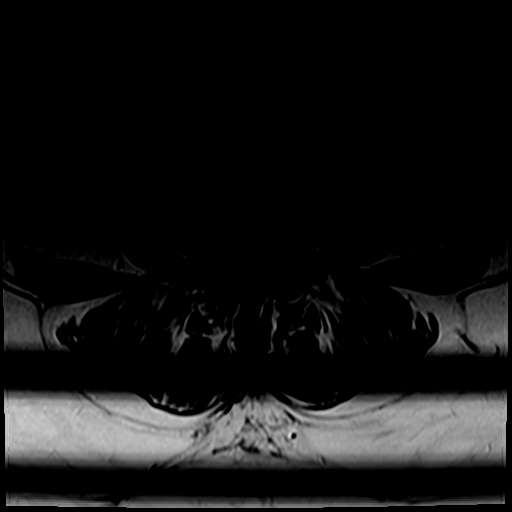
[im 16/35]
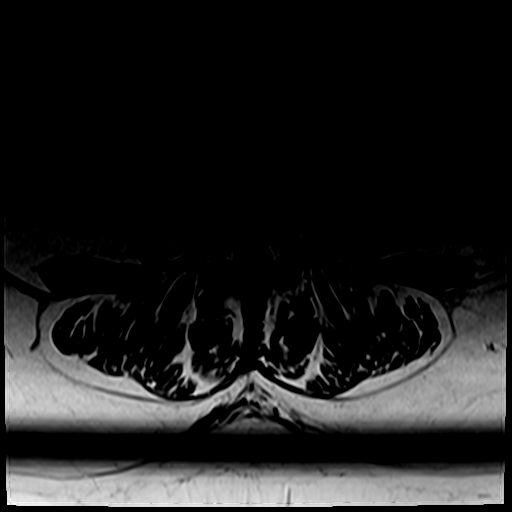
[im 19/35]
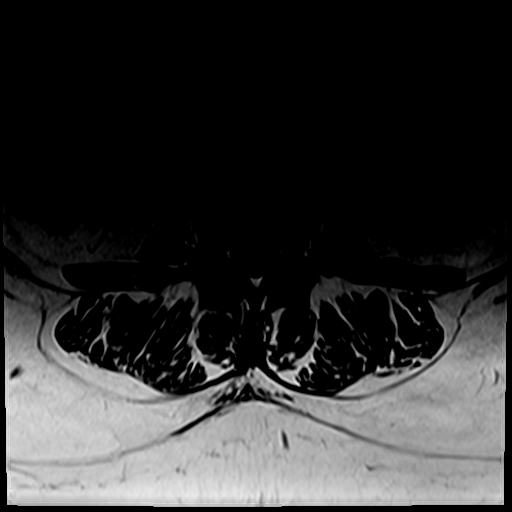
[im 23/35]
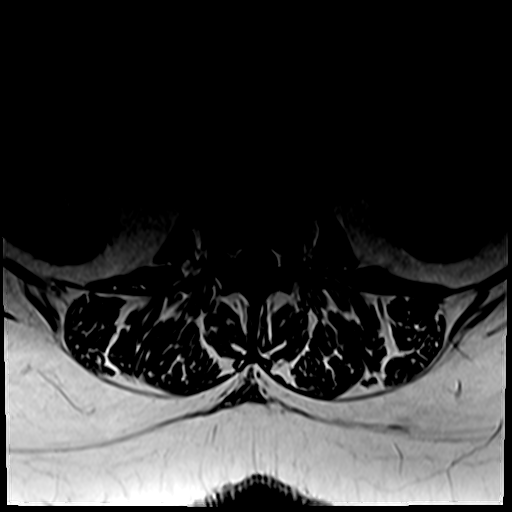
[im 31/35]
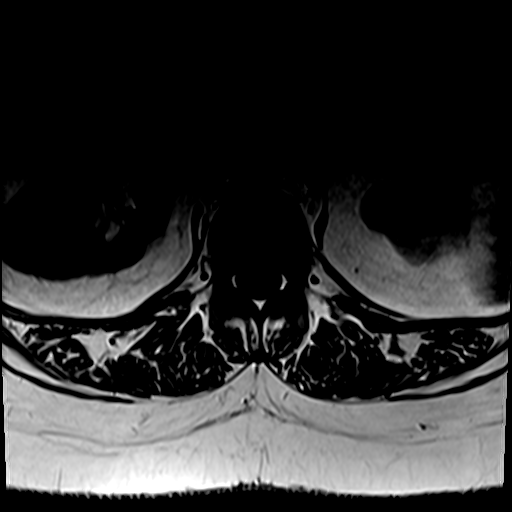
[im 35/35]
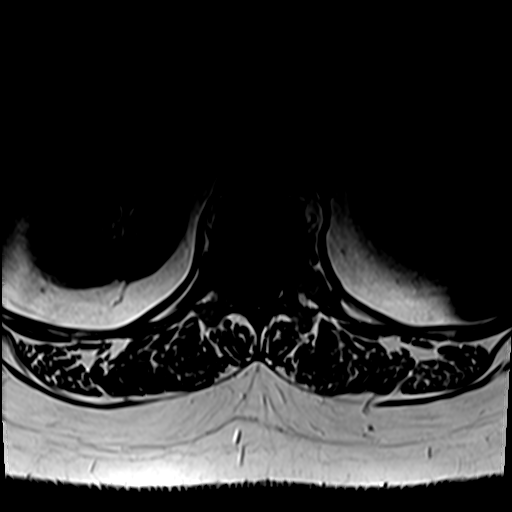

[Series 6: T1 fat-sat post-contrast · sagittal · 4.0mm · 0.75mm/px · 4 of 15 slices shown]
[im 1/15]
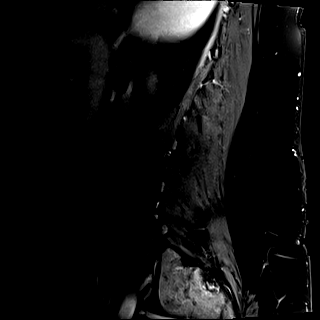
[im 5/15]
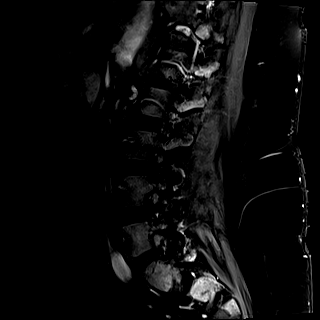
[im 10/15]
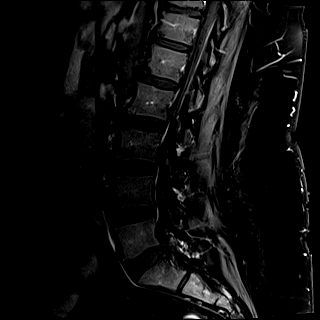
[im 15/15]
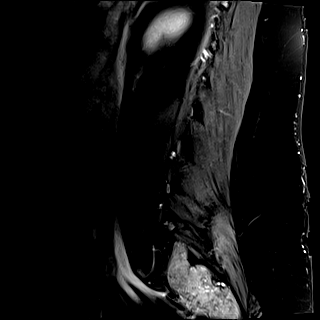

[32 of 48 positions shown; findings below may reference images not displayed]

FINDINGS: Segmentation:  Standard.

Alignment:  Physiologic.

Vertebrae: No acute fracture. No evidence of discitis. No suspicious
bone lesion. Generalized low T1 marrow signal is similar to prior.

Conus medullaris and cauda equina: Conus extends to the L1 level.
Conus and cauda equina appear normal. No epidural fluid collection
or abscess.

Paraspinal and other soft tissues: Negative. No paravertebral
inflammatory changes.

Disc levels:

T12-L1: Unremarkable.

L1-L2: Unremarkable disc. Mild bilateral facet hypertrophy. No
foraminal or canal stenosis.

L2-L3: Unremarkable disc. Mild bilateral facet hypertrophy. No
foraminal or canal stenosis.

L3-L4: Unremarkable disc. Mild bilateral facet hypertrophy. No
foraminal or canal stenosis.

L4-L5: Unremarkable disc. Mild bilateral facet hypertrophy. No
foraminal or canal stenosis.

L5-S1: Minimal disc bulge. Moderate bilateral facet arthropathy. No
foraminal or canal stenosis.
IMPRESSION: 1. No acute osseous abnormality or evidence of discitis of the
lumbar spine.
2. Lumbar facet arthropathy, greatest at L5-S1. No foraminal or
canal stenosis at any level.

## 2021-05-31 IMAGING — US US EXTREM LOW VENOUS
1 series · 13 of 24 positions shown · non-contrast
Comparison: None Available.

CLINICAL DATA: Positive D-dimer.



[Series 1: us venous img lower bilat (dvt) · portal-venous · 13 of 58 slices shown]
[im 1/58]
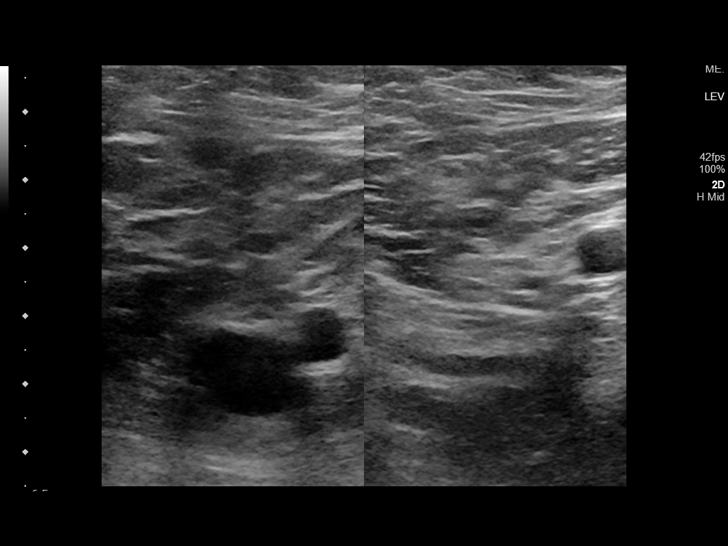
[im 5/58]
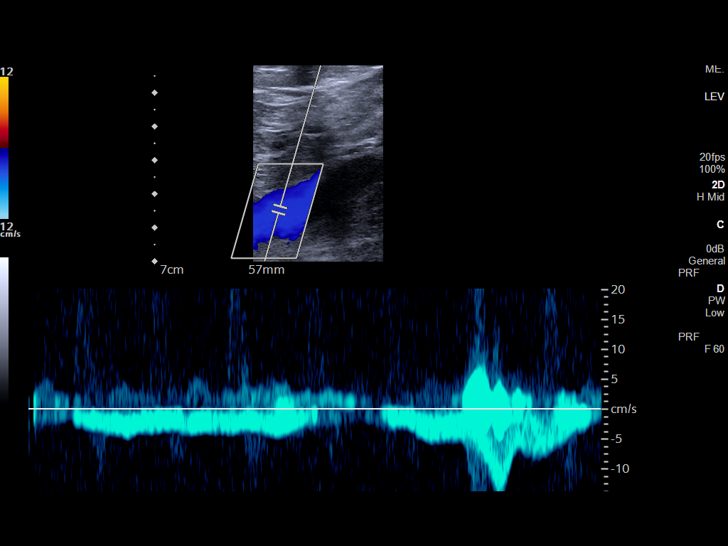
[im 10/58]
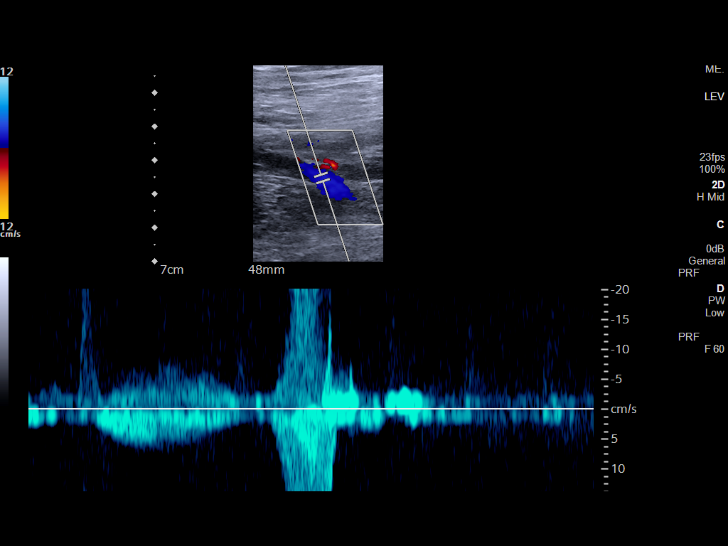
[im 15/58]
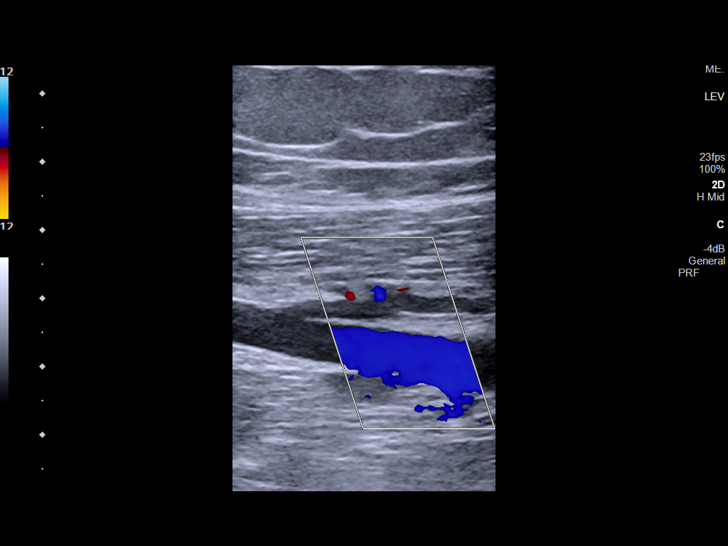
[im 20/58]
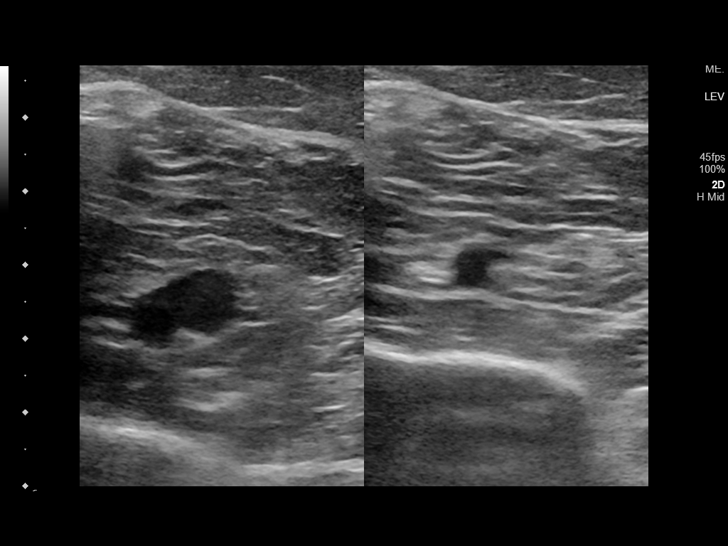
[im 25/58]
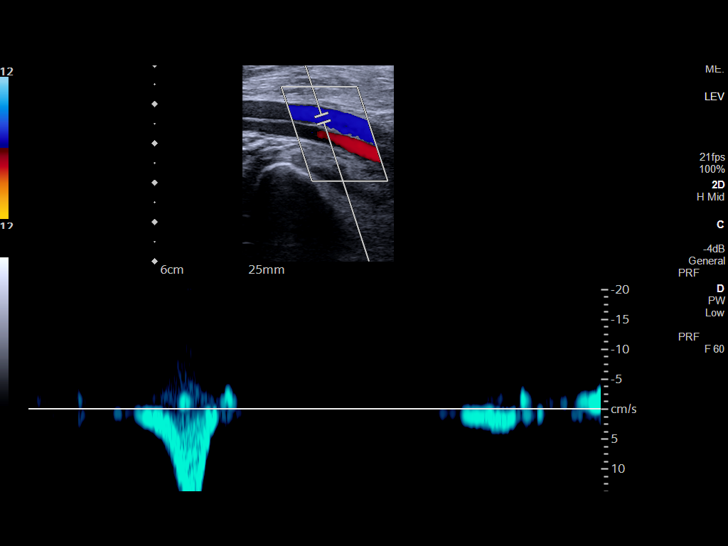
[im 30/58]
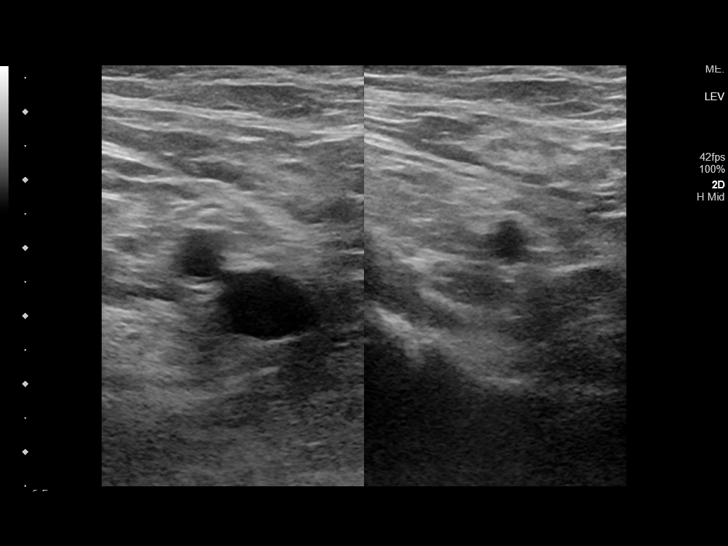
[im 33/58]
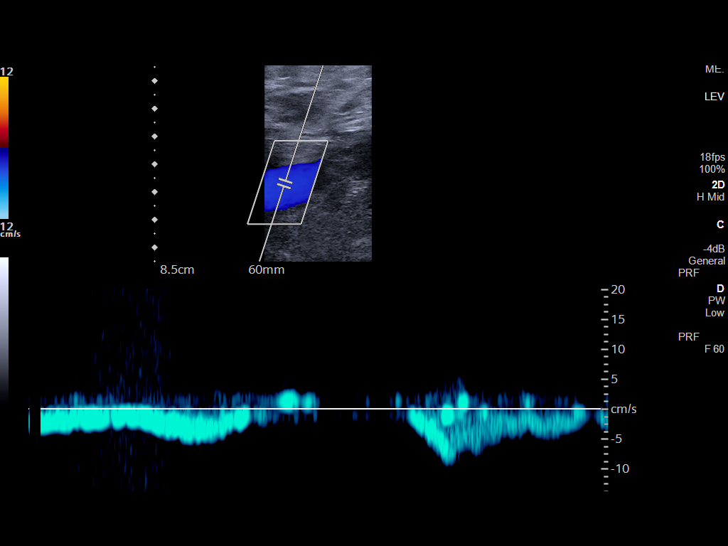
[im 38/58]
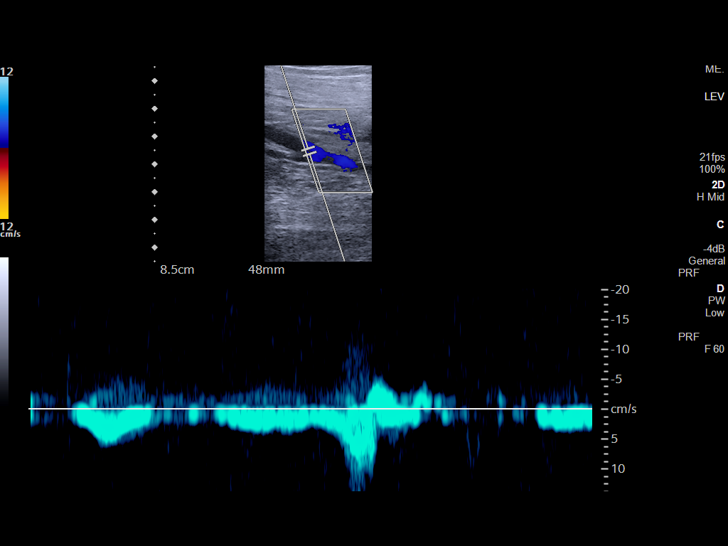
[im 43/58]
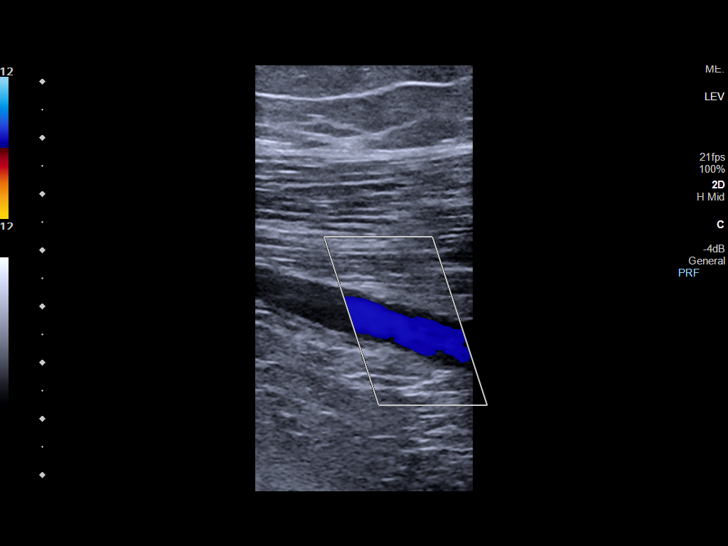
[im 48/58]
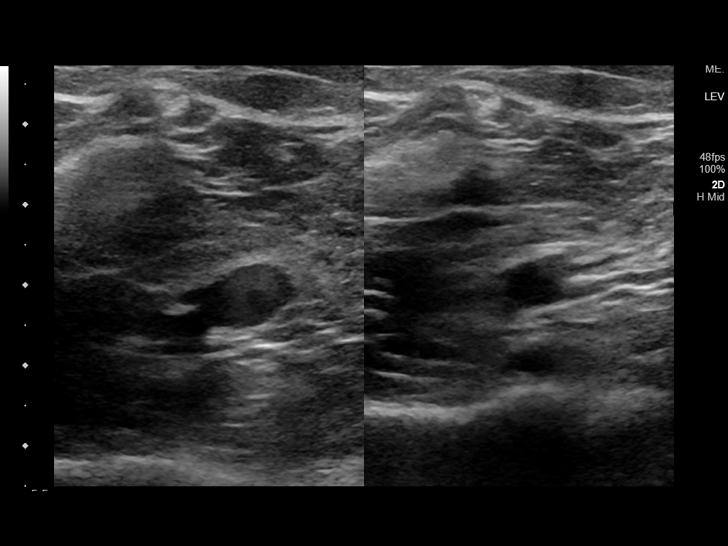
[im 53/58]
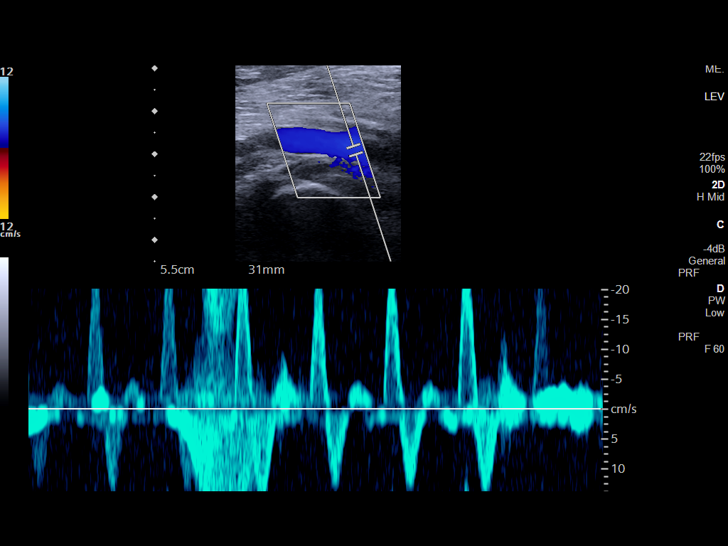
[im 58/58]
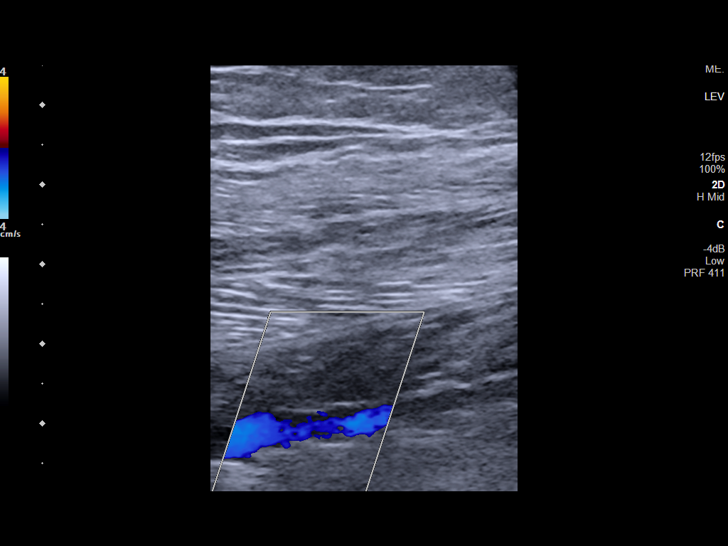

[13 of 24 positions shown; findings below may reference images not displayed]

FINDINGS: RIGHT LOWER EXTREMITY

Common Femoral Vein: No evidence of thrombus. Normal
compressibility, respiratory phasicity and response to augmentation.

Saphenofemoral Junction: No evidence of thrombus. Normal
compressibility and flow on color Doppler imaging.

Profunda Femoral Vein: No evidence of thrombus. Normal
compressibility and flow on color Doppler imaging.

Femoral Vein: No evidence of thrombus. Normal compressibility,
respiratory phasicity and response to augmentation.

Popliteal Vein: No evidence of thrombus. Normal compressibility,
respiratory phasicity and response to augmentation.

Calf Veins: No evidence of thrombus. Normal compressibility and flow
on color Doppler imaging.

Superficial Great Saphenous Vein: No evidence of thrombus. Normal
compressibility.

LEFT LOWER EXTREMITY

Common Femoral Vein: No evidence of thrombus. Normal
compressibility, respiratory phasicity and response to augmentation.

Saphenofemoral Junction: No evidence of thrombus. Normal
compressibility and flow on color Doppler imaging.

Profunda Femoral Vein: No evidence of thrombus. Normal
compressibility and flow on color Doppler imaging.

Femoral Vein: No evidence of thrombus. Normal compressibility,
respiratory phasicity and response to augmentation.

Popliteal Vein: No evidence of thrombus. Normal compressibility,
respiratory phasicity and response to augmentation.

Calf Veins: No evidence of thrombus. Normal compressibility and flow
on color Doppler imaging.

Superficial Great Saphenous Vein: No evidence of thrombus. Normal
compressibility.
IMPRESSION: No evidence of deep venous thrombosis in either lower extremity.

## 2021-05-31 IMAGING — MR MR THORACIC SPINE WO/W CM
7 of 9 series · 32 of 48 positions shown · IV contrast (gadavist)
Comparison: CT [DATE], MRI [DATE]

CLINICAL DATA: T8 vertebral body fracture. Concern for discitis
osteomyelitis. History of IV drug use. Pneumonia

EXAM:
MRI THORACIC WITHOUT AND WITH CONTRAST
TECHNIQUE: Multiplanar and multiecho pulse sequences of the thoracic spine were
obtained without and with intravenous contrast.
CONTRAST:  10mL GADAVIST GADOBUTROL 1 MMOL/ML IV SOLN

[Series 18: T1 · sagittal · 6.0mm · 1.88mm/px · 1 of 9 slices shown (1 of 2)]
[im 1/9]
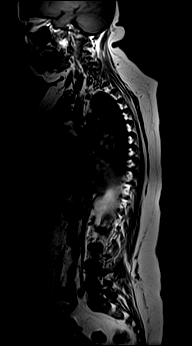

[Series 19: T2 · sagittal · 3.0mm · 1.17mm/px · 4 of 17 slices shown (1 of 2)]
[im 1/17]
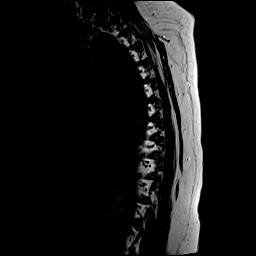
[im 6/17]
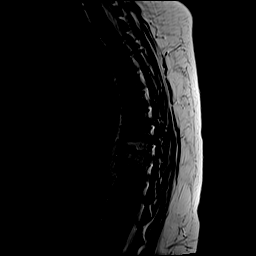
[im 11/17]
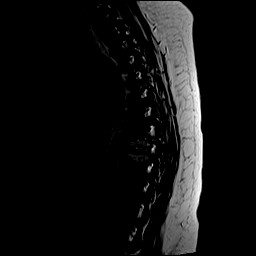
[im 17/17]
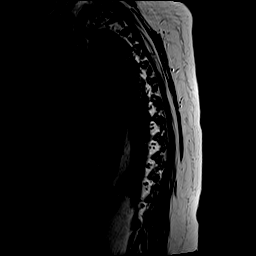

[Series 20: T1 · sagittal · 3.0mm · 1.17mm/px · 4 of 17 slices shown (2 of 2)]
[im 1/17]
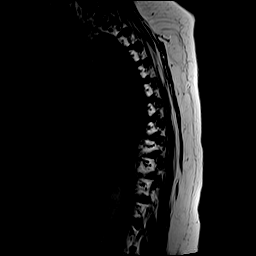
[im 6/17]
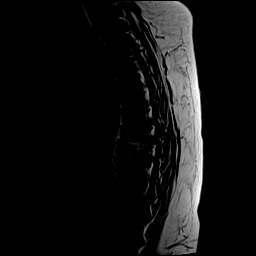
[im 11/17]
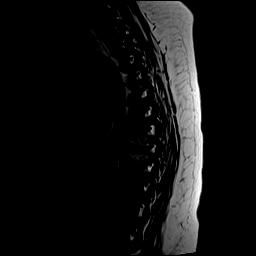
[im 17/17]
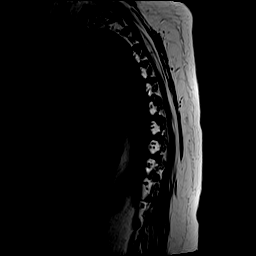

[Series 21: STIR · sagittal · 3.0mm · 0.59mm/px · 3 of 14 slices shown]
[im 1/14]
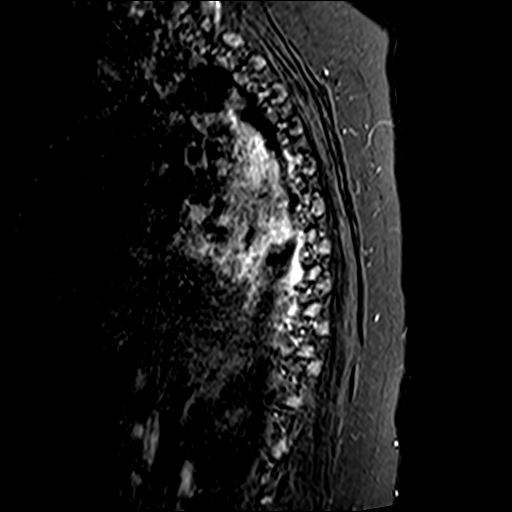
[im 7/14]
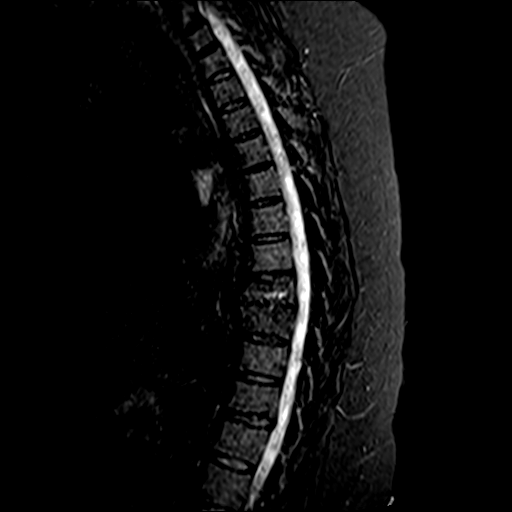
[im 14/14]
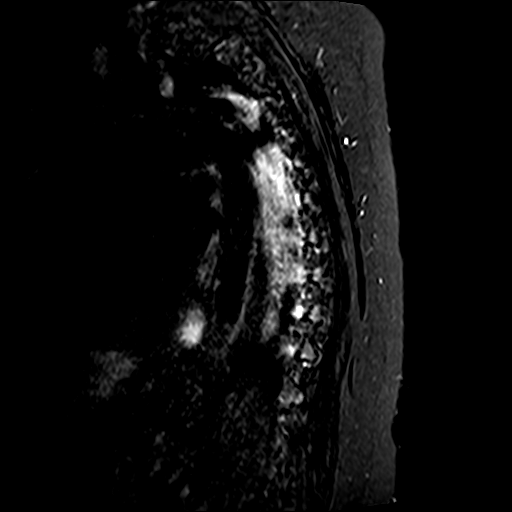

[Series 22: T2 · axial · 4.0mm · 0.59mm/px · z∈[-308,-124]mm · 8 of 39 slices shown (2 of 2)]
[im 1/39]
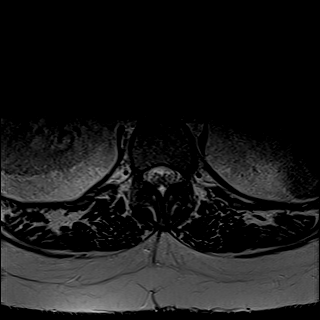
[im 6/39]
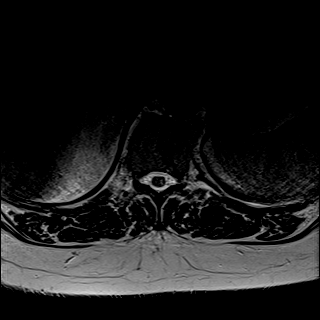
[im 11/39]
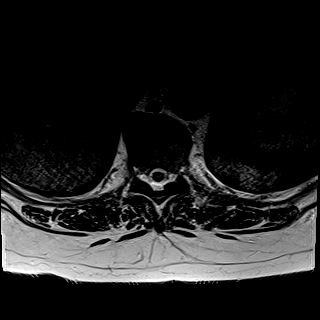
[im 17/39]
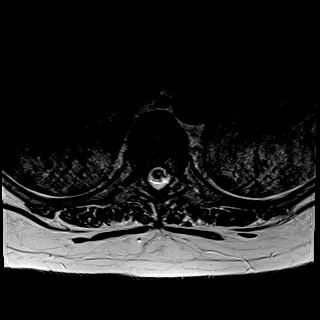
[im 22/39]
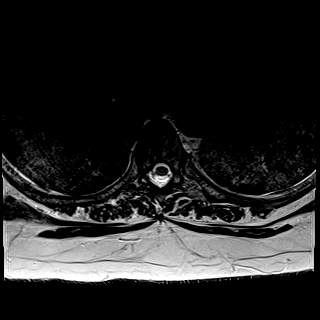
[im 28/39]
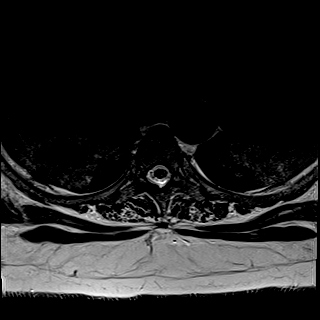
[im 33/39]
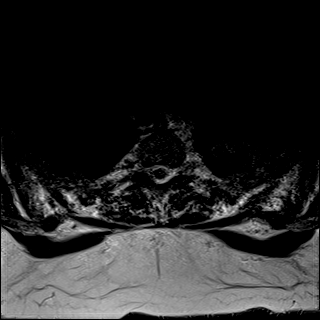
[im 39/39]
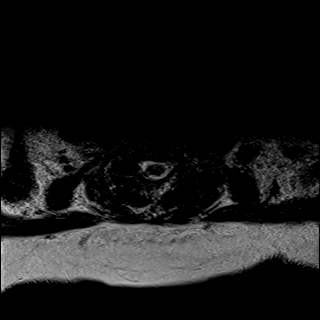

[Series 24: T1 post-contrast · axial · non-contrast · 4.0mm · 0.37mm/px · z∈[-308,-124]mm · 8 of 39 slices shown]
[im 1/39]
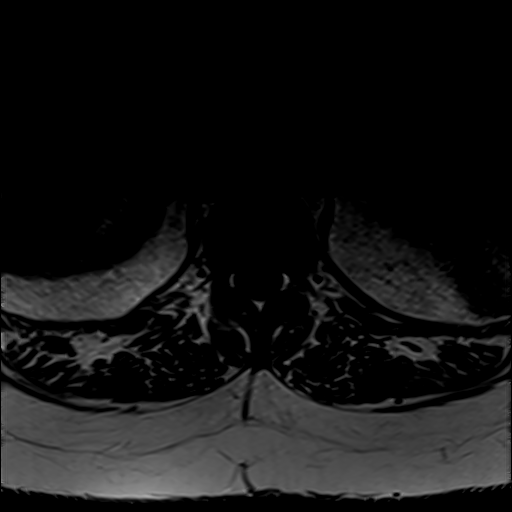
[im 6/39]
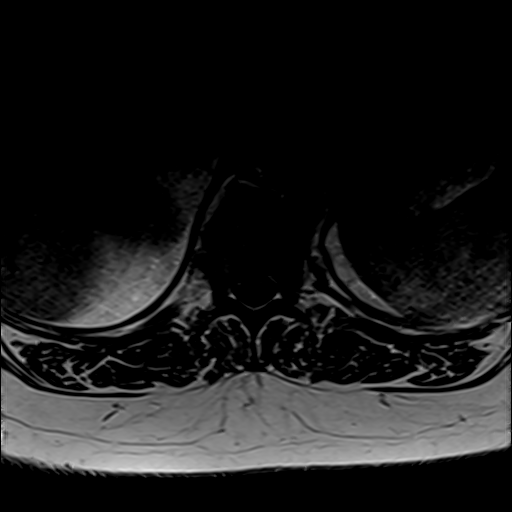
[im 11/39]
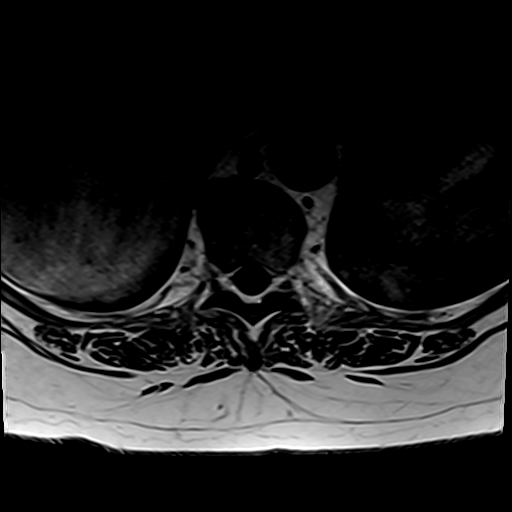
[im 17/39]
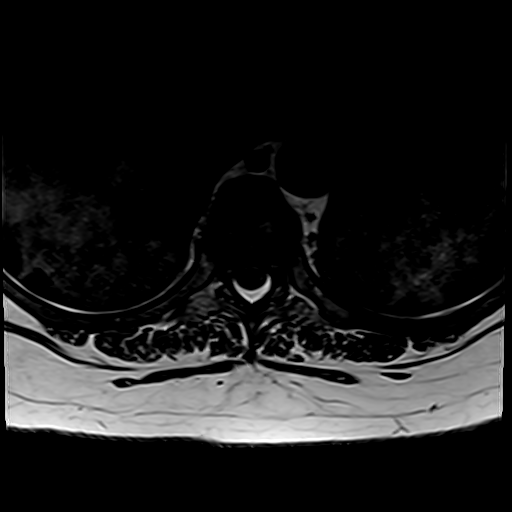
[im 22/39]
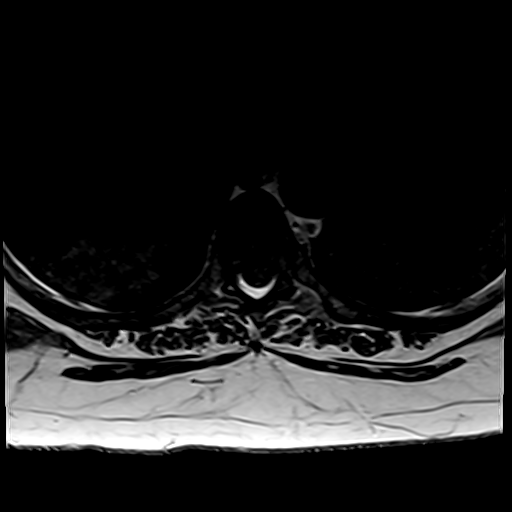
[im 28/39]
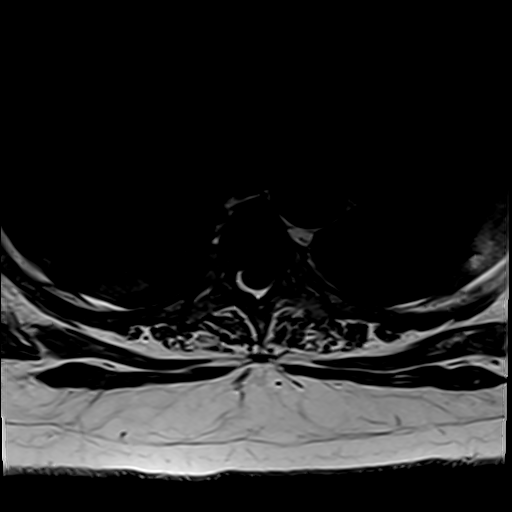
[im 33/39]
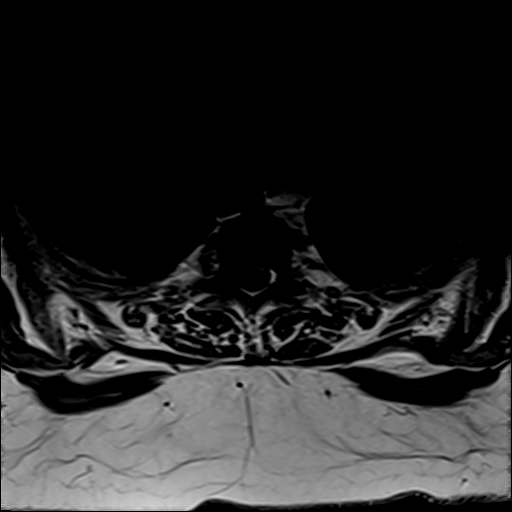
[im 39/39]
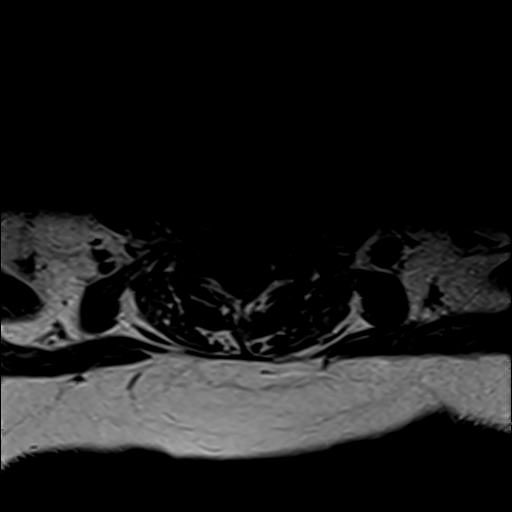

[Series 26: T1 fat-sat post-contrast · sagittal · 3.0mm · 1.17mm/px · 4 of 17 slices shown]
[im 1/17]
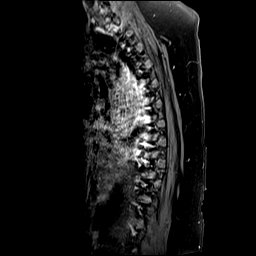
[im 6/17]
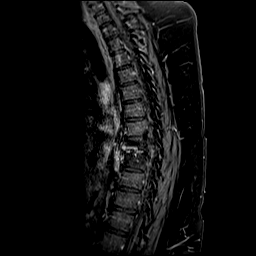
[im 11/17]
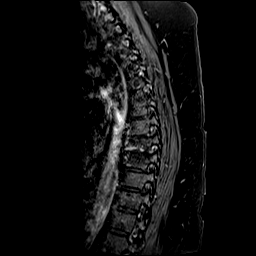
[im 17/17]
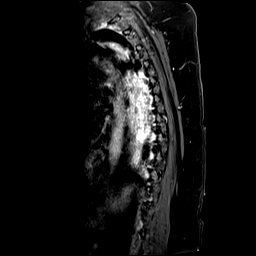

[32 of 48 positions shown; findings below may reference images not displayed]

FINDINGS: Alignment:  Physiologic.

Vertebrae: Enhancement of the T8-9 intervertebral disc with
associated endplate erosions, more pronounced along the inferior
endplate of T8 with mild bone marrow edema and enhancement. Findings
compatible with discitis osteomyelitis. Anterior compression
fracture of T8 with approximately 30% vertebral body height loss,
new from [DATE]. Mild paravertebral inflammatory changes. No
paravertebral fluid collection or abscess. No epidural fluid
collection or abscess. No additional sites of discitis
osteomyelitis. No additional fractures. No suspicious bone lesion.

Cord:  Normal signal and morphology.

Paraspinal and other soft tissues: Extensive bilateral airspace
consolidations, better characterized by CT.

Disc levels:

Minimal disc bulge at T8-9. Additional small disc bulge at T11-12.
No foraminal or canal stenosis at any level within the thoracic
spine.
IMPRESSION: 1. Findings compatible with discitis-osteomyelitis at T8-9.
Appearance favors indolent or acute on chronic process.
2. No epidural fluid collection or abscess.
3. Anterior compression fracture of T8 with approximately 30%
vertebral body height loss.
4. Extensive bilateral airspace consolidations, better characterized
by CT.

## 2021-05-31 MED ORDER — IPRATROPIUM-ALBUTEROL 0.5-2.5 (3) MG/3ML IN SOLN
3.0000 mL | Freq: Once | RESPIRATORY_TRACT | Status: AC
Start: 2021-05-31 — End: 2021-05-31
  Administered 2021-05-31: 3 mL via RESPIRATORY_TRACT
  Filled 2021-05-31: qty 3

## 2021-05-31 MED ORDER — ALBUTEROL SULFATE (2.5 MG/3ML) 0.083% IN NEBU: 2.5000 mg | INHALATION_SOLUTION | RESPIRATORY_TRACT | Status: DC | PRN

## 2021-05-31 MED ORDER — SODIUM CHLORIDE 0.9 % IV BOLUS: 500.0000 mL | Freq: Once | INTRAVENOUS | Status: DC

## 2021-05-31 MED ORDER — VANCOMYCIN HCL IN DEXTROSE 1-5 GM/200ML-% IV SOLN
1000.0000 mg | Freq: Two times a day (BID) | INTRAVENOUS | Status: DC
  Administered 2021-06-01 – 2021-06-02 (×3): 1000 mg via INTRAVENOUS
  Filled 2021-05-31 (×5): qty 200

## 2021-05-31 MED ORDER — POTASSIUM CHLORIDE CRYS ER 20 MEQ PO TBCR
60.0000 meq | EXTENDED_RELEASE_TABLET | Freq: Once | ORAL | Status: AC
  Administered 2021-05-31: 60 meq via ORAL
  Filled 2021-05-31: qty 3

## 2021-05-31 MED ORDER — LORAZEPAM 2 MG/ML IJ SOLN: 0.0000 mg | Freq: Four times a day (QID) | INTRAMUSCULAR | Status: AC

## 2021-05-31 MED ORDER — ONDANSETRON HCL 4 MG/2ML IJ SOLN: 4.0000 mg | Freq: Three times a day (TID) | INTRAMUSCULAR | Status: DC | PRN

## 2021-05-31 MED ORDER — GADOBUTROL 1 MMOL/ML IV SOLN
10.0000 mL | Freq: Once | INTRAVENOUS | Status: AC | PRN
  Administered 2021-05-31: 10 mL via INTRAVENOUS

## 2021-05-31 MED ORDER — IOHEXOL 350 MG/ML SOLN
75.0000 mL | Freq: Once | INTRAVENOUS | Status: AC | PRN
  Administered 2021-05-31: 75 mL via INTRAVENOUS

## 2021-05-31 MED ORDER — QUETIAPINE FUMARATE 25 MG PO TABS
50.0000 mg | ORAL_TABLET | Freq: Every day | ORAL | Status: DC
  Administered 2021-06-01: 50 mg via ORAL
  Filled 2021-05-31 (×2): qty 2

## 2021-05-31 MED ORDER — HEPARIN BOLUS VIA INFUSION
4000.0000 [IU] | Freq: Once | INTRAVENOUS | Status: AC
  Administered 2021-05-31: 4000 [IU] via INTRAVENOUS
  Filled 2021-05-31: qty 4000

## 2021-05-31 MED ORDER — SODIUM CHLORIDE 0.9 % IV SOLN
2.0000 g | Freq: Once | INTRAVENOUS | Status: AC
  Administered 2021-05-31: 2 g via INTRAVENOUS
  Filled 2021-05-31: qty 12.5

## 2021-05-31 MED ORDER — LACTATED RINGERS IV SOLN: INTRAVENOUS | Status: AC

## 2021-05-31 MED ORDER — ACETAMINOPHEN 325 MG PO TABS
650.0000 mg | ORAL_TABLET | Freq: Four times a day (QID) | ORAL | Status: DC | PRN
Start: 2021-05-31 — End: 2021-06-05
  Administered 2021-05-31 – 2021-06-04 (×7): 650 mg via ORAL
  Filled 2021-05-31 (×7): qty 2

## 2021-05-31 MED ORDER — PANTOPRAZOLE SODIUM 40 MG PO TBEC
40.0000 mg | DELAYED_RELEASE_TABLET | Freq: Every day | ORAL | Status: DC
  Administered 2021-06-01 – 2021-06-05 (×5): 40 mg via ORAL
  Filled 2021-05-31 (×5): qty 1

## 2021-05-31 MED ORDER — ADULT MULTIVITAMIN W/MINERALS CH
1.0000 | ORAL_TABLET | Freq: Every day | ORAL | Status: DC
  Administered 2021-06-01 – 2021-06-05 (×5): 1 via ORAL
  Filled 2021-05-31 (×5): qty 1

## 2021-05-31 MED ORDER — NICOTINE 21 MG/24HR TD PT24
21.0000 mg | MEDICATED_PATCH | Freq: Every day | TRANSDERMAL | Status: DC
Start: 2021-05-31 — End: 2021-06-05
  Administered 2021-06-01 – 2021-06-05 (×5): 21 mg via TRANSDERMAL
  Filled 2021-05-31 (×5): qty 1

## 2021-05-31 MED ORDER — IPRATROPIUM-ALBUTEROL 0.5-2.5 (3) MG/3ML IN SOLN
3.0000 mL | Freq: Four times a day (QID) | RESPIRATORY_TRACT | Status: DC
  Administered 2021-05-31 – 2021-06-02 (×9): 3 mL via RESPIRATORY_TRACT
  Filled 2021-05-31 (×10): qty 3

## 2021-05-31 MED ORDER — THIAMINE HCL 100 MG PO TABS
100.0000 mg | ORAL_TABLET | Freq: Every day | ORAL | Status: DC
  Administered 2021-06-01 – 2021-06-05 (×5): 100 mg via ORAL
  Filled 2021-05-31 (×5): qty 1

## 2021-05-31 MED ORDER — HEPARIN (PORCINE) 25000 UT/250ML-% IV SOLN
1000.0000 [IU]/h | INTRAVENOUS | Status: DC
  Administered 2021-05-31: 1000 [IU]/h via INTRAVENOUS
  Filled 2021-05-31: qty 250

## 2021-05-31 MED ORDER — VANCOMYCIN HCL IN DEXTROSE 1-5 GM/200ML-% IV SOLN
1000.0000 mg | Freq: Once | INTRAVENOUS | Status: AC
  Administered 2021-05-31: 1000 mg via INTRAVENOUS
  Filled 2021-05-31: qty 200

## 2021-05-31 MED ORDER — LORAZEPAM 2 MG/ML IJ SOLN
1.0000 mg | INTRAMUSCULAR | Status: AC | PRN
  Administered 2021-06-01: 1 mg via INTRAVENOUS
  Filled 2021-05-31: qty 1

## 2021-05-31 MED ORDER — METHOCARBAMOL 500 MG PO TABS
750.0000 mg | ORAL_TABLET | Freq: Two times a day (BID) | ORAL | Status: DC | PRN
  Administered 2021-06-02 – 2021-06-05 (×4): 750 mg via ORAL
  Filled 2021-05-31: qty 1
  Filled 2021-05-31 (×2): qty 2
  Filled 2021-05-31 (×3): qty 1

## 2021-05-31 MED ORDER — LORAZEPAM 1 MG PO TABS
1.0000 mg | ORAL_TABLET | ORAL | Status: AC | PRN
  Administered 2021-06-02: 1 mg via ORAL
  Filled 2021-05-31: qty 1

## 2021-05-31 MED ORDER — FLUOXETINE HCL 10 MG PO CAPS
20.0000 mg | ORAL_CAPSULE | Freq: Every day | ORAL | Status: DC
  Administered 2021-06-01 – 2021-06-05 (×5): 20 mg via ORAL
  Filled 2021-05-31 (×2): qty 1
  Filled 2021-05-31 (×2): qty 2
  Filled 2021-05-31 (×2): qty 1

## 2021-05-31 MED ORDER — IPRATROPIUM-ALBUTEROL 0.5-2.5 (3) MG/3ML IN SOLN
RESPIRATORY_TRACT | Status: AC
  Administered 2021-05-31: 3 mL via RESPIRATORY_TRACT
  Filled 2021-05-31: qty 3

## 2021-05-31 MED ORDER — HEPARIN SODIUM (PORCINE) 5000 UNIT/ML IJ SOLN
5000.0000 [IU] | Freq: Three times a day (TID) | INTRAMUSCULAR | Status: DC
  Administered 2021-05-31 – 2021-06-05 (×16): 5000 [IU] via SUBCUTANEOUS
  Filled 2021-05-31 (×16): qty 1

## 2021-05-31 MED ORDER — FOLIC ACID 1 MG PO TABS: 1.0000 mg | ORAL_TABLET | Freq: Every day | ORAL | Status: DC

## 2021-05-31 MED ORDER — THIAMINE HCL 100 MG/ML IJ SOLN
100.0000 mg | Freq: Every day | INTRAMUSCULAR | Status: DC
  Filled 2021-05-31 (×2): qty 2

## 2021-05-31 MED ORDER — LORAZEPAM 2 MG/ML IJ SOLN: 0.0000 mg | Freq: Two times a day (BID) | INTRAMUSCULAR | Status: AC

## 2021-05-31 MED ORDER — ASPIRIN 81 MG PO CHEW
324.0000 mg | CHEWABLE_TABLET | Freq: Once | ORAL | Status: AC
  Administered 2021-05-31: 324 mg via ORAL
  Filled 2021-05-31: qty 4

## 2021-05-31 MED ORDER — VANCOMYCIN HCL 1500 MG/300ML IV SOLN
1500.0000 mg | Freq: Once | INTRAVENOUS | Status: AC
  Administered 2021-05-31: 1500 mg via INTRAVENOUS
  Filled 2021-05-31: qty 300

## 2021-05-31 MED ORDER — ASPIRIN EC 81 MG PO TBEC
81.0000 mg | DELAYED_RELEASE_TABLET | Freq: Every day | ORAL | Status: DC
  Administered 2021-06-01 – 2021-06-05 (×5): 81 mg via ORAL
  Filled 2021-05-31 (×5): qty 1

## 2021-05-31 MED ORDER — SODIUM CHLORIDE 0.9 % IV SOLN: 500.0000 mg | INTRAVENOUS | Status: DC

## 2021-05-31 MED ORDER — FOLIC ACID 1 MG PO TABS
1.0000 mg | ORAL_TABLET | Freq: Every day | ORAL | Status: DC
  Administered 2021-06-01 – 2021-06-05 (×5): 1 mg via ORAL
  Filled 2021-05-31 (×5): qty 1

## 2021-05-31 MED ORDER — DM-GUAIFENESIN ER 30-600 MG PO TB12
1.0000 | ORAL_TABLET | Freq: Two times a day (BID) | ORAL | Status: DC | PRN
Start: 2021-05-31 — End: 2021-06-05
  Administered 2021-06-01 – 2021-06-03 (×3): 1 via ORAL
  Filled 2021-05-31 (×3): qty 1

## 2021-05-31 MED ORDER — ATORVASTATIN CALCIUM 20 MG PO TABS
40.0000 mg | ORAL_TABLET | Freq: Every day | ORAL | Status: DC
  Administered 2021-05-31 – 2021-06-05 (×6): 40 mg via ORAL
  Filled 2021-05-31 (×6): qty 2

## 2021-05-31 MED ORDER — SODIUM CHLORIDE 0.9 % IV SOLN: 2.0000 g | INTRAVENOUS | Status: DC

## 2021-05-31 NOTE — Assessment & Plan Note (Signed)
See above

## 2021-05-31 NOTE — Assessment & Plan Note (Addendum)
UCx no growth and symptoms resolved  ?

## 2021-05-31 NOTE — Assessment & Plan Note (Addendum)
Chest pain and elevated troponin: Troponin level 330, 243, 252.  Her chest pain is a pleuritic, CT angiogram negative for PE.  Elevated troponin was likely due to demand ischemia. ?-Start aspirin and Lipitor ?-Trend troponin down ?

## 2021-05-31 NOTE — Assessment & Plan Note (Addendum)
Pt had this issue last year and was treated with abx. MRI showed appearance favors indolent or acute on chronic process. ?-On vancomycin and cefepime ? ?

## 2021-05-31 NOTE — ED Provider Notes (Signed)
? ?[facility] ?Provider Note ? ? ? Event Date/Time  ? First MD Initiated Contact with Patient 05/31/21 0914   ?  (approximate) ? ? ?History  ? ?Chest Pain and Shortness of Breath ? ? ?HPI ? ?Brenda [name] is a 51 y.o. female with a history of asthma presents to the ER for evaluation of shortness of breath chest pain particular worsening over the past 12 to 24 hours.  Has been feeling little more winded over the past week.  Has not had a productive cough.  No chills.  No nausea or vomiting. ?  ? ? ?Physical Exam  ? ?Triage Vital Signs: ?ED Triage Vitals  ?Enc Vitals Group  ?   BP 05/31/21 0911 125/72  ?   Pulse Rate 05/31/21 0911 (!) 113  ?   Resp 05/31/21 0911 (!) 30  ?   Temp 05/31/21 0911 98.6 ?F (37 ?C)  ?   Temp Source 05/31/21 0911 Oral  ?   SpO2 05/31/21 0911 (!) 77 %  ?   Weight 05/31/21 0912 230 lb (104.3 kg)  ?   Height 05/31/21 0912 5' 2.5" (1.588 m)  ?   Head Circumference --   ?   Peak Flow --   ?   Pain Score 05/31/21 0912 1  ?   Pain Loc --   ?   Pain Edu? --   ?   Excl. in GC? --   ? ? ?Most recent vital signs: ?Vitals:  ? 05/31/21 1000 05/31/21 1145  ?BP: (!) 89/60 105/78  ?Pulse: (!) 121 (!) 104  ?Resp: (!) 28 (!) 25  ?Temp:    ?SpO2: 99% 93%  ? ? ? ?Constitutional: Alert but in mod resp distress ?Eyes: Conjunctivae are normal.  ?Head: Atraumatic. ?Nose: No congestion/rhinnorhea. ?Mouth/Throat: Mucous membranes are moist.   ?Neck: Painless ROM.  ?Cardiovascular:   Good peripheral circulation.  Mildly tachycardic ?Respiratory: mild tachypnea ?Gastrointestinal: Soft and nontender.  ?Musculoskeletal:  no deformity ?Neurologic:  MAE spontaneously. No gross focal neurologic deficits are appreciated.  ?Skin:  Skin is warm, dry and intact. No rash noted. ?Psychiatric: Mood and affect are normal. Speech and behavior are normal. ? ? ? ?ED Results / Procedures / Treatments  ? ?Labs ?(all labs ordered are listed, but only abnormal results are displayed) ?Labs Reviewed  ?CBC -  Abnormal; Notable for the following components:  ?    Result Value  ? WBC 14.7 (*)   ? RBC 6.02 (*)   ? MCV 70.6 (*)   ? MCH 21.4 (*)   ? RDW 20.8 (*)   ? All other components within normal limits  ?COMPREHENSIVE METABOLIC PANEL - Abnormal; Notable for the following components:  ? Potassium 3.0 (*)   ? CO2 21 (*)   ? Glucose, Bld 135 (*)   ? Calcium 8.6 (*)   ? Albumin 3.3 (*)   ? AST 44 (*)   ? All other components within normal limits  ?D-DIMER, QUANTITATIVE - Abnormal; Notable for the following components:  ? D-Dimer, Quant 1.73 (*)   ? All other components within normal limits  ?BRAIN NATRIURETIC PEPTIDE - Abnormal; Notable for the following components:  ? B Natriuretic Peptide 213.3 (*)   ? All other components within normal limits  ?URINALYSIS, COMPLETE (UACMP) WITH MICROSCOPIC - Abnormal; Notable for the following components:  ? Color, Urine AMBER (*)   ? APPearance CLOUDY (*)   ? Protein, ur 100 (*)   ? Leukocytes,Ua MODERATE (*)   ?  Bacteria, UA RARE (*)   ? All other components within normal limits  ?TROPONIN I (HIGH SENSITIVITY) - Abnormal; Notable for the following components:  ? Troponin I (High Sensitivity) 330 (*)   ? All other components within normal limits  ?RESP PANEL BY RT-PCR (FLU A&B, COVID) [id number]  ?CULTURE, BLOOD (ROUTINE X 2)  ?CULTURE, BLOOD (ROUTINE X 2)  ?LACTIC ACID, PLASMA  ?LACTIC ACID, PLASMA  ?PROTIME-INR  ?APTT  ?PROCALCITONIN  ?HEPARIN LEVEL (UNFRACTIONATED)  ?POC URINE PREG, ED  ?TROPONIN I (HIGH SENSITIVITY)  ? ? ? ?EKG ? ?ED ECG REPORT ?I, Willy EddyPatrick Jannel Lynne, the attending physician, personally viewed and interpreted this ECG. ? ? Date: 05/31/2021 ? EKG Time: 9:06 ? Rate: 115 ? Rhythm: sinus ? Axis: normal ? Intervals:normal ? ST&T Change: no stemi, no depressions ? ? ? ?RADIOLOGY ?Please see ED Course for my review and interpretation. ? ?I personally reviewed all radiographic images ordered to evaluate for the above acute complaints and reviewed radiology reports and findings.   These findings were personally discussed with the patient.  Please see medical record for radiology report. ? ? ? ?PROCEDURES: ? ?Critical Care performed: yes ? ?.Critical Care ?Performed by: Willy Eddyobinson, Disa Riedlinger, MD ?Authorized by: Willy Eddyobinson, Agustus Mane, MD  ? ?Critical care provider statement:  ?  Critical care time (minutes):  48 ?  Critical care was necessary to treat or prevent imminent or life-threatening deterioration of the following conditions:  Respiratory failure ?  Critical care was time spent personally by me on the following activities:  Ordering and performing treatments and interventions, ordering and review of laboratory studies, ordering and review of radiographic studies, pulse oximetry, re-evaluation of patient's condition, review of old charts, obtaining history from patient or surrogate, examination of patient, evaluation of patient's response to treatment, discussions with primary provider, discussions with consultants and development of treatment plan with patient or surrogate ? ? ?MEDICATIONS ORDERED IN ED: ?Medications  ?sodium chloride 0.9 % bolus 500 mL (has no administration in time range)  ?heparin ADULT infusion 100 units/mL (25000 units/25950mL) (1,000 Units/hr Intravenous New Bag/Given 05/31/21 1142)  ?vancomycin (VANCOCIN) IVPB 1000 mg/200 mL premix (has no administration in time range)  ?lactated ringers infusion (has no administration in time range)  ?cefTRIAXone (ROCEPHIN) 2 g in sodium chloride 0.9 % 100 mL IVPB (has no administration in time range)  ?azithromycin (ZITHROMAX) 500 mg in sodium chloride 0.9 % 250 mL IVPB (has no administration in time range)  ?ipratropium-albuterol (DUONEB) 0.5-2.5 (3) MG/3ML nebulizer solution 3 mL (3 mLs Nebulization Given 05/31/21 0943)  ?ipratropium-albuterol (DUONEB) 0.5-2.5 (3) MG/3ML nebulizer solution 3 mL (3 mLs Nebulization Given 05/31/21 0943)  ?iohexol (OMNIPAQUE) 350 MG/ML injection 75 mL (75 mLs Intravenous Contrast Given 05/31/21 1103)  ?heparin  bolus via infusion 4,000 Units (4,000 Units Intravenous Bolus from Bag 05/31/21 1144)  ? ? ? ?IMPRESSION / MDM / ASSESSMENT AND PLAN / ED COURSE  ?I reviewed the triage vital signs and the nursing notes. ?             ?               ? ?Differential diagnosis includes, but is not limited to, Asthma, copd, CHF, pna, ptx, malignancy, Pe, anemia ? ?Patient presenting with acute respiratory failure with hypoxia.  She is protecting her airway requiring supplemental oxygen.  Blood work as well as imaging will be sent for the blood differential.  Will provide nebulizer treatment.  No fever have a lower suspicion for sepsis. ? ?  Clinical Course as of 05/31/21 1200  ?Fri May 31, 2021  ?0932 Patient currently on 6 L on high flow appears comfortable.  Do not feel that she requires BiPAP. [PR]  ?1041 Troponin is critically elevated.  D-dimer elevated.  Chest x-ray on my review and interpretation shows bilateral infiltrates versus edema.  Troponin may be secondary to demand ischemia in the setting of hypoxia.  Do feel that CTA clinically indicated will place on heparin. [PR]  ?1140 ET imaging was significant pulmonary infiltrates no pulmonary embolism.  Discussion with in consultation with radiology also suspicion for possible discitis will order MRI with and without contrast.  At this point she does appear stable for admission to the hospital.  Have consulted with hospitalist for admission. [PR]  ?  ?Clinical Course User Index ?[PR] Willy Eddy, MD  ? ? ? ?FINAL CLINICAL IMPRESSION(S) / ED DIAGNOSES  ? ?Final diagnoses:  ?Acute respiratory failure with hypoxia (HCC)  ? ? ? ?Rx / DC Orders  ? ?ED Discharge Orders   ? ? None  ? ?  ? ? ? ?Note:  This document was prepared using Dragon voice recognition software and may include unintentional dictation errors.  ?  ?Willy Eddy, MD ?05/31/21 1200 ? ?

## 2021-05-31 NOTE — Assessment & Plan Note (Addendum)
-   Bronchodilators 

## 2021-05-31 NOTE — Consult Note (Signed)
PHARMACY -  BRIEF ANTIBIOTIC NOTE  ? ?Pharmacy has received consult(s) for Vancomycin from an ED provider.  The patient's profile has been reviewed for ht/wt/allergies/indication/available labs.  Pregnancy test neg. ? ?One time order(s) placed for: ?Vancomycin 1g IV x1  ? ?Further antibiotics/pharmacy consults should be ordered by admitting physician if indicated.       ?                ?Thank you, ?Sherlynn Carbon Deleah Tison ?05/31/2021  11:44 AM ? ?

## 2021-05-31 NOTE — Assessment & Plan Note (Addendum)
counseling about importance of quitting substance abuse ?

## 2021-05-31 NOTE — ED Notes (Signed)
Delay in medications due to pt in MRI.  ?

## 2021-05-31 NOTE — Assessment & Plan Note (Signed)
-   Continue home medications 

## 2021-05-31 NOTE — Assessment & Plan Note (Addendum)
Admitted w/ Acute respiratory failure with hypoxia and sepsis due to multifocal pneumonia: CT angiograms negative for PE.  Patient has sepsis with WBC 14.7, heart rate 121, RR 30.  Lactic acid normal 1.4. Sepsis RESOLVED and 5 days broad spectrum antibiotics completed at time of discharge, off oxygen at time of discharge, no concerns on lung exam. TO follow outpatient to resolution, consider repeat CT or CXR.  ? ?

## 2021-05-31 NOTE — Consult Note (Signed)
Pharmacy Antibiotic Note ? ?GORETTI DUX is a 51 y.o. 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  Pharmacy has been consulted for Vancomycin and Cefepime dosing. ? ?Plan: ?Vancomycin 1g x1 in ED. Will repeat 1.5g x1 now to complete 2.5g loading dose. Followed by 1g q12h ?Goal AUC 400-600  ?Est AUC: 503; Cmax: 32; Cmin: 13.8 ?SCr 0.9; AdjBW; Vd 0.5 (BMI > 40) ? ?F/u MRSA PCR ordered & cultures. ? ?Cefepime 2g IV x1 in ED. Followed by Cefepime 2g IV Q8H ? ?Height: 5' 2.5" (158.8 cm) ?Weight: 104.3 kg (230 lb) ?IBW/kg (Calculated) : 51.25 ? ?Temp (24hrs), Avg:98.6 ?F (37 ?C), Min:98.6 ?F (37 ?C), Max:98.6 ?F (37 ?C) ? ?Recent Labs  ?Lab 05/31/21 ?0923 05/31/21 ?1018  ?WBC 14.7*  --   ?CREATININE 0.90  --   ?LATICACIDVEN  --  1.4  ?  ?Estimated Creatinine Clearance: 86.5 mL/min (by C-G formula based on SCr of 0.9 mg/dL).   ? ?Allergies  ?Allergen Reactions  ? Flexeril [Cyclobenzaprine] Other (See Comments)  ?  RLS-like symptoms   ? Quetiapine Fumarate Other (See Comments)  ?  RLS-like symptoms only at doses greater than 100mg  per patient.  ? ? ?Antimicrobials this admission: ?VAN/CFp (5/05 >>  ? ?Dose adjustments this admission: ?CTM and adjust PRN ? ?Microbiology results: ?5/05 BCx: pending ?5/05 Sputum: pending  ?5/05 Cov/Flu: negative  ?5/05 MRSA PCR: ordered ? ?Thank you for allowing pharmacy to be a part of this patient?s care. ? ?Shanon Brow Geddy [place] ?05/31/2021 12:30 PM ? ?

## 2021-05-31 NOTE — Assessment & Plan Note (Signed)
-   Hold blood pressure medications since blood pressure is soft ?

## 2021-05-31 NOTE — Assessment & Plan Note (Addendum)
-   CIWA protocol while inpatient ?

## 2021-05-31 NOTE — ED Notes (Signed)
RN to bedside. RN AP was placing pt in room. Pt oxygen 77% on [place] at 5 liters. Placed on NRB. Improved to 97%. RT was called.  ?

## 2021-05-31 NOTE — ED Triage Notes (Signed)
Patient c/o tightness to chest that started around a week ago. Reports sob started X2 days ago and has progressively worsened. A&O x4 in triage ?

## 2021-05-31 NOTE — ED Notes (Signed)
Pt ambulated to toilet and back to bed. 

## 2021-05-31 NOTE — Assessment & Plan Note (Signed)
See assessment and plan the chest pain ?

## 2021-05-31 NOTE — ED Notes (Signed)
Pt ambulated to toilet. Pt became more SOB, more tachycardic and pulse ox dropped.  ?

## 2021-05-31 NOTE — Consult Note (Signed)
ANTICOAGULATION CONSULT NOTE - Follow Up Consult ? ?Pharmacy Consult for Heparin gtt ?Indication: chest pain/ACS ? ?Allergies  ?Allergen Reactions  ? Flexeril [Cyclobenzaprine] Other (See Comments)  ?  RLS-like symptoms   ? Quetiapine Fumarate Other (See Comments)  ?  RLS-like symptoms only at doses greater than 100mg  per patient.  ? ? ?Patient Measurements: ?Height: 5' 2.5" (158.8 cm) ?Weight: 104.3 kg (230 lb) ?IBW/kg (Calculated) : 51.25 ?Heparin Dosing Weight: 76.1kg ? ?Vital Signs: ?Temp: 98.6 ?F (37 ?C) (05/05 0911) ?Temp Source: Oral (05/05 [id number]) ?BP: 89/60 (05/05 1000) ?Pulse Rate: 121 (05/05 1000) ? ?Labs: ?Recent Labs  ?  05/31/21 ?0923  ?HGB 12.9  ?HCT 42.5  ?PLT 267  ?CREATININE 0.90  ?TROPONINIHS 330*  ? ? ?Estimated Creatinine Clearance: 86.5 mL/min (by C-G formula based on SCr of 0.9 mg/dL). ? ? ?Medications: no pertinent AC/APT drug allergies ?Heparin Dosing Weight: 76.1kg ?PTA: N/A ?Inpatient: +heparin gtt ? ?Assessment: ?51 y.o. F w/ h/o asthma presents to the ER for evaluation of SOB a/w chest pain particularly worsening over the past 12 to 24 hours. No AC/APT noted prior to arrival per chart review. Pharmacy consulted for initiation & mgmt of heparin ? ?Date Time aPTT/HL Rate/Comment ?      ?Baseline Labs: ?aPTT - pending ?INR - pending ?Hgb - 12.9 ?Plts - 267 ?Trop -  330; D-Dimer 1.73 ? ?Goal of Therapy:  ?Heparin level 0.3-0.7 units/ml ?Monitor platelets by anticoagulation protocol: Yes ?  ?Plan:  ?Give 4000 units bolus x1; then start heparin infusion at 1000 units/hr ?Check anti-Xa level in 6 hours and daily once consecutively therapeutic. ?Continue to monitor H&H and platelets daily while on heparin gtt. ? ?Shanon Brow Chyane Greer ?05/31/2021,11:00 AM ? ? ?

## 2021-05-31 NOTE — Assessment & Plan Note (Signed)
-  Nicotine patch 

## 2021-05-31 NOTE — Assessment & Plan Note (Signed)
-   Did counseling about importance of quitting substance abuse ?

## 2021-05-31 NOTE — H&P (Addendum)
?History and Physical  ? ? Brenda [name] [id number] DOB: August 05, 1970 DOA: 05/31/2021 ? ?Referring MD/NP/PA:  ? ?PCP: Rolm Gala, NP  ? ?Patient coming from:  The patient is coming from home.     ? ? ?Chief Complaint: SOB, chest pain ? ?HPI: Brenda [name] is a 51 y.o. female with medical history significant of polysubstance abuse, IV drug user, hypertension, COPD, asthma, GERD, depression, anxiety, HCV, bipolar, alcohol abuse, tobacco abuse, dCHF, discitis of T-spine, who presents with chest pain and shortness of breath. ? ?Patient states that she has chest pain for almost a week, which is located in front chest, 10 out of 10 in severity initially, currently mild, nonradiating, pleuritic, aggravated by deep breath.  She states she developed cough and shortness breath in the past 2 days, which has been progressively worsening.  She coughs up white-colored mucus, no fever or chills.  Patient states that she has nausea and vomited 2 days ago, which has resolved.  Currently patient does not have nausea vomiting, diarrhea or abdominal pain.  Denies symptoms of UTI.  Patient states that she has T-spine discitis last year, and still has some upper back pain. ? ?Patient was initially hypotensive with blood pressure 85/55, which improved to 105/78 after giving 500 cc normal saline in the ED. Pt was found to have severe respiratory distress, oxygen desaturation to 77% on room air, initially started nonrebreather, then 6 L oxygen, then changed to 5 L oxygen with saturation 93% currently. ? ?Data Reviewed and ED Course: pt was found to have WBC 14.7, troponin level 330, lactic acid 1.4, BNP 213, negative pregnancy test, urinalysis (amber appearance, moderate amount of leukocyte, rare bacteria.  WBC 21-50), negative COVID PCR, positive D-dimer 1.73, potassium 3.0, GFR> 60, temperature normal, ? ?CTA: ?Severe bilateral airspace disease most consistent with multifocal ?pneumonia/pneumonitis, would consider  atypical etiologies. Recommend ?chest CT follow-up to resolution. ?  ?New anterior compression fracture of T8 with approximately 30% ?height loss, and progressive severe disc height loss at T8-T9 with ?some endplate irregularity and persistent paraspinal soft tissue ?thickening edema at this level. 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 Recommend thoracic spine MRI with ?and without contrast. ?  ?No evidence of pulmonary embolism. ? ? ?MRI-T spin: ?1. 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 ?Appearance favors indolent or acute on chronic process. ?2. No epidural fluid collection or abscess. ?3. Anterior compression fracture of T8 with approximately 30% ?vertebral body height loss. ?4. Extensive bilateral airspace consolidations, better characterized ?by CT. ? ?MRI-L spin: ?1. No acute osseous abnormality or evidence of discitis of the ?lumbar spine. ?2. Lumbar facet arthropathy, greatest at L5-S1. No foraminal or ?canal stenosis at any level. ?  ? ? ?EKG: I have personally reviewed.  Sinus rhythm, QTc 464, left atrial enlargement, poor R wave progression, Q wave in lead III ? ? ?Review of Systems:  ? ?General: no fevers, chills, no body weight gain, has poor appetite, has fatigue ?HEENT: no blurry vision, hearing changes or sore throat ?Respiratory: has dyspnea, coughing, no wheezing ?CV: has chest pain, no palpitations ?GI: no nausea, vomiting, abdominal pain, diarrhea, constipation ?GU: no dysuria, burning on urination, increased urinary frequency, hematuria  ?Ext: has trace leg edema ?Neuro: no unilateral weakness, numbness, or tingling, no vision change or hearing loss ?Skin: no rash, no skin tear. ?MSK: No muscle spasm, no deformity, no limitation of range of movement in spin. Has upper back pain ?Heme: No easy bruising.  ?Travel history: No recent  long distant travel. ? ? ?Allergy:  ?Allergies  ?Allergen Reactions  ? Flexeril [Cyclobenzaprine] Other (See Comments)  ?   RLS-like symptoms   ? Quetiapine Fumarate Other (See Comments)  ?  RLS-like symptoms only at doses greater than 100mg  per patient.  ? ? ?Past Medical History:  ?Diagnosis Date  ? Ankle fracture, left   ? in past.  ? Anxiety   ? Arthritis   ? Right hip, scheduled for replacement 7/13/74m, left ankle  ? Asthma   ? Bipolar disorder (HCC)   ? Chronic hepatitis C (HCC)   ? COPD (chronic obstructive pulmonary disease) (HCC)   ? Depression   ? Dyspnea   ? occasional with exertion  ? GERD (gastroesophageal reflux disease)   ? pmh  ? Headache   ? Hearing loss   ? some loss in both ears - no dx - per patient "runs in family", no hearing aids  ? Hypertension   ? Lump in female breast   ? right  ? Pneumonia   ? x 1  ? Polysubstance abuse (HCC)   ? Primary localized osteoarthritis of hip   ? right  ? SVD (spontaneous vaginal delivery)   ? x 3  ? Uses roller walker   ? occasional  ? Wears dentures   ? full upper  ? ? ?Past Surgical History:  ?Procedure Laterality Date  ? TOTAL HIP ARTHROPLASTY Right 09/13/2018  ? TOTAL HIP ARTHROPLASTY Right 09/13/2018  ? Procedure: RIGHT TOTAL HIP ARTHROPLASTY ANTERIOR APPROACH;  Surgeon: Tarry KosXu, Naiping M, MD;  Location: [facility] OR;  Service: Orthopedics;  Laterality: Right;  ? TUBAL LIGATION    ? ? ?Social History:  reports that she has been smoking cigarettes. She has a 31.00 pack-year smoking history. She has never used smokeless tobacco. She reports current alcohol use. She reports that she does not currently use drugs after having used the following drugs: Cocaine and Heroin. Frequency: 7.00 times per week. ? ?Family History:  ?Family History  ?Problem Relation Age of Onset  ? Ovarian cancer Neg Hx   ? Colon cancer Neg Hx   ? Breast cancer Neg Hx   ?  ? ?Prior to Admission medications   ?Medication Sig Start Date End Date Taking? Authorizing Provider  ?albuterol (PROAIR HFA) 108 (90 Base) MCG/ACT inhaler Inhale 2 puffs into the lungs once every 6 (six) hours as needed for wheezing. 02/05/21      ?ciprofloxacin (CIPRO) 500 MG tablet TAKE 1 AND 1/2 TABLETS (750MG  TOTAL) BY MOUTH TWICE DAILY FOR 14 DAYS. 09/27/20     ?esomeprazole (NEXIUM) 40 MG capsule Take 1 capsule by mouth once a day 02/05/21     ?FLUoxetine (PROZAC) 20 MG capsule Take 1 capsule (20 mg total) by mouth once daily. 02/28/21     ?folic acid (FOLVITE) 1 MG tablet Take 1 tablet (1 mg total) by mouth once daily. 08/30/20   Alford HighlandWieting, Richard, MD  ?ibuprofen (ADVIL) 800 MG tablet TAKE 1 TABLET BY MOUTH ONCE EVERY EIGHT HOURS WITH FOOD AS NEEDED FOR HAND PAIN. 04/04/21     ?methocarbamol (ROBAXIN) 750 MG tablet TAKE 1 TABLET BY MOUTH ONCE EVERY 12 HOURS AS NEEDED FOR MUSCLE SPASM. 03/07/21     ?QUEtiapine (SEROQUEL) 50 MG tablet Take 1 tablet (50 mg total) by mouth once daily. 02/28/21     ?thiamine (VITAMIN B-1) 100 MG tablet Take 1 tablet (100 mg total) by mouth once daily. 08/30/20   Alford HighlandWieting, Richard, MD  ?Tiotropium Bromide Monohydrate (SPIRIVA  RESPIMAT) 1.25 MCG/ACT AERS INHALE 1 PUFF INTO THE LUNGS ONCE DAILY. 02/05/21     ? ? ?Physical Exam: ?Vitals:  ? 05/31/21 1530 05/31/21 1630 05/31/21 2004 05/31/21 2004  ?BP: 101/77 96/69 109/72 109/72  ?Pulse: 97 (!) 104 86 90  ?Resp: 20 (!) 28 (!) 24   ?Temp:   98.4 ?F (36.9 ?C)   ?TempSrc:   Oral   ?SpO2: 90% 94% 98%   ?Weight:      ?Height:      ? ?General: Not in acute distress ?HEENT: ?      Eyes: PERRL, EOMI, no scleral icterus. ?      ENT: No discharge from the ears and nose, no pharynx injection, no tonsillar enlargement.  ?      Neck: No JVD, no bruit, no mass felt. ?Heme: No neck lymph node enlargement. ?Cardiac: S1/S2, RRR, No murmurs, No gallops or rubs. ?Respiratory: has fine crackles bilaterally ?GI: Soft, nondistended, nontender, no rebound pain, no organomegaly, BS present. ?GU: No hematuria ?Ext: has trace leg edema bilaterally. 1+DP/PT pulse bilaterally. ?Musculoskeletal: No joint deformities, No joint redness or warmth, no limitation of ROM in spin.  ?Skin: No rashes.  ?Neuro: Alert, oriented X3,  cranial nerves II-XII grossly intact, moves all extremities normally.  ?Psych: Patient is not psychotic, no suicidal or hemocidal ideation. ? ?Labs on Admission: I have personally reviewed following labs and imagin

## 2021-06-01 DIAGNOSIS — M4644 Discitis, unspecified, thoracic region: Secondary | ICD-10-CM

## 2021-06-01 LAB — CBC
HCT: 42.1 % (ref 36.0–46.0)
Hemoglobin: 12.4 g/dL (ref 12.0–15.0)
MCH: 21.5 pg — ABNORMAL LOW (ref 26.0–34.0)
MCHC: 29.5 g/dL — ABNORMAL LOW (ref 30.0–36.0)
MCV: 73 fL — ABNORMAL LOW (ref 80.0–100.0)
Platelets: 291 10*3/uL (ref 150–400)
RBC: 5.77 MIL/uL — ABNORMAL HIGH (ref 3.87–5.11)
RDW: 21.2 % — ABNORMAL HIGH (ref 11.5–15.5)
WBC: 11.7 10*3/uL — ABNORMAL HIGH (ref 4.0–10.5)
nRBC: 0 % (ref 0.0–0.2)

## 2021-06-01 LAB — BASIC METABOLIC PANEL
Anion gap: 6 (ref 5–15)
BUN: 9 mg/dL (ref 6–20)
CO2: 24 mmol/L (ref 22–32)
Calcium: 8.5 mg/dL — ABNORMAL LOW (ref 8.9–10.3)
Chloride: 107 mmol/L (ref 98–111)
Creatinine, Ser: 0.72 mg/dL (ref 0.44–1.00)
GFR, Estimated: >60 mL/min (ref >60)
Glucose, Bld: 89 mg/dL (ref 70–99)
Potassium: 4.8 mmol/L (ref 3.5–5.1)
Sodium: 137 mmol/L (ref 135–145)

## 2021-06-01 LAB — GLUCOSE, CAPILLARY: Glucose-Capillary: 111 mg/dL — ABNORMAL HIGH (ref 70–99)

## 2021-06-01 LAB — EXPECTORATED SPUTUM ASSESSMENT W GRAM STAIN, RFLX TO RESP C

## 2021-06-01 LAB — PROCALCITONIN: Procalcitonin: 0.94 ng/mL

## 2021-06-01 LAB — TROPONIN I (HIGH SENSITIVITY): Troponin I (High Sensitivity): 163 ng/L (ref <18)

## 2021-06-01 LAB — MRSA NEXT GEN BY PCR, NASAL: MRSA by PCR Next Gen: NOT DETECTED

## 2021-06-01 MED ORDER — SODIUM CHLORIDE 0.9 % IV SOLN
2.0000 g | Freq: Three times a day (TID) | INTRAVENOUS | Status: DC
  Administered 2021-06-01 – 2021-06-05 (×13): 2 g via INTRAVENOUS
  Filled 2021-06-01 (×2): qty 12.5
  Filled 2021-06-01: qty 2
  Filled 2021-06-01 (×2): qty 12.5
  Filled 2021-06-01 (×3): qty 2
  Filled 2021-06-01: qty 12.5
  Filled 2021-06-01: qty 2
  Filled 2021-06-01 (×4): qty 12.5

## 2021-06-01 MED ORDER — CHLORHEXIDINE GLUCONATE CLOTH 2 % EX PADS
6.0000 | MEDICATED_PAD | Freq: Every day | CUTANEOUS | Status: DC
  Administered 2021-06-01 – 2021-06-04 (×4): 6 via TOPICAL

## 2021-06-01 NOTE — Progress Notes (Signed)
?PROGRESS NOTE ? ? ? Brenda [name]  QVZ:[id number] DOB: [birth year] DOA: 05/31/2021 ?PCP: Langston Reusing, NP  ? ? ?Assessment & Plan: ?  ?Principal Problem: ?  Multifocal pneumonia ?Active Problems: ?  Sepsis ([place]) ?  Acute respiratory failure with hypoxia ([place]) ?  Cocaine use disorder, moderate, dependence ([place]) ?  Alcohol abuse ?  Tobacco use disorder ?  Polysubstance abuse ([place]) ?  HTN (hypertension) ?  Depression with anxiety ?  Chest pain ?  UTI (urinary tract infection) ?  Discitis of thoracic region ?  COPD (chronic obstructive pulmonary disease) ([place]) ?  Elevated troponin ? ?Assessment and Plan: ?Multifocal pneumonia: as per CTA chest. Continue on IV vanco, cefepime, bronchodilators & encourage incentive spirometry. Legionella, strep are pending. Blood cxs are pending.  ? ?Acute hypoxic respiratory failure: found to be 77% on RA. Continue on supplemental oxygen and wean as tolerated. Secondary to above.  ?  ?Sepsis: met criteria w/ leukocytosis, tachycardia, tachypnea & discitis of thoracic region. Continue on IV abxs  ? ?COPD: w/o excerbation. Continue on bronchodilators  ?  ?Discitis of thoracic region: MRI showed appearance favors indolent or acute on chronic process. Continue on IV cefepime, vanco. Had this problem last year & was treated w/ abx  ? ?T8 compression fracture: continue w/ supportive care.  ? ?UTI: urine cx ordered but not collected yet. Discussed w/ pt's nurse today. Continue on IV vanco, cefepime  ?  ?Chest pain: w/ elevated troponins likely due to demand ischemia. CTA chest neg for PE ?  ?Depression: severity unknown. Continue on home dose of fluoxetine  ?  ?HTN: hold all anti-HTN meds at BP is low normal  ? ?Tobacco use disorder: nicotine patch to prevent w/drawal. Smoking cessation counseling  ? ?Alcohol abuse: continue on CIWA protocol. Alcohol cessation counseling  ? ?Illicit drug use: urine screen positive for benzos, amphetamines. Also uses cocaine. Received illicit drug use  cessation counseling  ?  ? ? ? ? ? ?DVT prophylaxis: heparin ?Code Status: full  ?Family Communication: discussed pt's care w/ pt's son, Herbie [place], and answered his questions  ?Disposition Plan: depends on PT/OT recs . ? ?Level of care: Stepdown ? ?Status is: Inpatient ?Remains inpatient appropriate because: requiring IV abxs ? ? ? ?Consultants:  ? ? ?Procedures:  ? ?Antimicrobials: cefepime, vanco  ? ? ?Subjective: ?Pt c/o back pain ? ?Objective: ?Vitals:  ? 06/01/21 0500 06/01/21 0600 06/01/21 0610 06/01/21 0700  ?BP: (!) 133/121 109/76 109/76 106/66  ?Pulse: 92 86 84 80  ?Resp: 18 (!) 21  (!) 25  ?Temp:      ?TempSrc:      ?SpO2: 98% 95%  99%  ?Weight:      ?Height:      ? ? ?Intake/Output Summary (Last 24 hours) at 06/01/2021 0809 ?Last data filed at 06/01/2021 0550 ?Gross per 24 hour  ?Intake 194.38 ml  ?Output --  ?Net 194.38 ml  ? ?Filed Weights  ? 05/31/21 0912  ?Weight: 104.3 kg  ? ? ?Examination: ? ?General exam: Appears lethargic  ?Respiratory system: Clear to auscultation. Respiratory effort normal. ?Cardiovascular system: S1 & S2+. No rubs, gallops or clicks.  ?Gastrointestinal system: Abdomen is nondistended, soft and nontender.  Normal bowel sounds heard. ?Central nervous system: Lethargic. Moves all extremities  ?Psychiatry: Judgement and insight appears poor. Flat mood and affect  ? ? ? ?Data Reviewed: I have personally reviewed following labs and imaging studies ? ?CBC: ?Recent Labs  ?Lab 05/31/21 ?3295 06/01/21 ?0426  ?WBC  14.7* 11.7*  ?HGB 12.9 12.4  ?HCT 42.5 42.1  ?MCV 70.6* 73.0*  ?PLT 267 291  ? ?Basic Metabolic Panel: ?Recent Labs  ?Lab 05/31/21 ?0923 05/31/21 ?1520 06/01/21 ?0426  ?NA 135  --  137  ?K 3.0*  --  4.8  ?CL 103  --  107  ?CO2 21*  --  24  ?GLUCOSE 135*  --  89  ?BUN 11  --  9  ?CREATININE 0.90  --  0.72  ?CALCIUM 8.6*  --  8.5*  ?MG  --  1.9  --   ? ?GFR: ?Estimated Creatinine Clearance: 97.4 mL/min (by C-G formula based on SCr of 0.72 mg/dL). ?Liver Function Tests: ?Recent Labs  ?Lab  05/31/21 ?0923  ?AST 44*  ?ALT 34  ?ALKPHOS 113  ?BILITOT 0.5  ?PROT 7.3  ?ALBUMIN 3.3*  ? ?No results for input(s): LIPASE, AMYLASE in the last 168 hours. ?No results for input(s): AMMONIA in the last 168 hours. ?Coagulation Profile: ?No results for input(s): INR, PROTIME in the last 168 hours. ?Cardiac Enzymes: ?No results for input(s): CKTOTAL, CKMB, CKMBINDEX, TROPONINI in the last 168 hours. ?BNP (last 3 results) ?No results for input(s): PROBNP in the last 8760 hours. ?HbA1C: ?No results for input(s): HGBA1C in the last 72 hours. ?CBG: ?No results for input(s): GLUCAP in the last 168 hours. ?Lipid Profile: ?Recent Labs  ?  05/31/21 ?1520  ?CHOL 98  ?HDL 28*  ?[place] 50  ?TRIG 99  ?CHOLHDL 3.5  ? ?Thyroid Function Tests: ?No results for input(s): TSH, T4TOTAL, FREET4, T3FREE, THYROIDAB in the last 72 hours. ?Anemia Panel: ?No results for input(s): VITAMINB12, FOLATE, FERRITIN, TIBC, IRON, RETICCTPCT in the last 72 hours. ?Sepsis Labs: ?Recent Labs  ?Lab 05/31/21 ?1018 05/31/21 ?1030 05/31/21 ?1520 06/01/21 ?0426  ?PROCALCITON  --  2.15  --  0.94  ?LATICACIDVEN 1.4  --  1.2  --   ? ? ?Recent Results (from the past 240 hour(s))  ?Resp Panel by RT-PCR (Flu A&B, Covid) Nasopharyngeal Swab     Status: None  ? Collection Time: 05/31/21  9:23 AM  ? Specimen: Nasopharyngeal Swab; Nasopharyngeal(NP) swabs in vial transport medium  ?Result Value Ref Range Status  ? SARS Coronavirus 2 by RT PCR NEGATIVE NEGATIVE Final  ?  Comment: (NOTE) ?SARS-CoV-2 target nucleic acids are NOT DETECTED. ? ?The SARS-CoV-2 RNA is generally detectable in upper respiratory ?specimens during the acute phase of infection. The lowest ?concentration of SARS-CoV-2 viral copies this assay can detect is ?138 copies/mL. A negative result does not preclude SARS-Cov-2 ?infection and should not be used as the sole basis for treatment or ?other patient management decisions. A negative result may occur with  ?improper specimen collection/handling,  submission of specimen other ?than nasopharyngeal swab, presence of viral mutation(s) within the ?areas targeted by this assay, and inadequate number of viral ?copies(<138 copies/mL). A negative result must be combined with ?clinical observations, patient history, and epidemiological ?information. The expected result is Negative. ? ?Fact Sheet for Patients:  ?EntrepreneurPulse.com.au ? ?Fact Sheet for Healthcare Providers:  ?IncredibleEmployment.be ? ?This test is no t yet approved or cleared by the [place] FDA and  ?has been authorized for detection and/or diagnosis of SARS-CoV-2 by ?FDA under an Emergency Use Authorization (EUA). This EUA will remain  ?in effect (meaning this test can be used) for the duration of the ?COVID-19 declaration under Section 564(b)(1) of the Act, 21 ?U.S.C.section 360bbb-3(b)(1), unless the authorization is terminated  ?or revoked sooner.  ? ? ?  ?  Influenza A by PCR NEGATIVE NEGATIVE Final  ? Influenza B by PCR NEGATIVE NEGATIVE Final  ?  Comment: (NOTE) ?The Xpert Xpress SARS-CoV-2/FLU/RSV plus assay is intended as an aid ?in the diagnosis of influenza from Nasopharyngeal swab specimens and ?should not be used as a sole basis for treatment. Nasal washings and ?aspirates are unacceptable for Xpert Xpress SARS-CoV-2/FLU/RSV ?testing. ? ?Fact Sheet for Patients: ?EntrepreneurPulse.com.au ? ?Fact Sheet for Healthcare Providers: ?IncredibleEmployment.be ? ?This test is not yet approved or cleared by the [place] FDA and ?has been authorized for detection and/or diagnosis of SARS-CoV-2 by ?FDA under an Emergency Use Authorization (EUA). This EUA will remain ?in effect (meaning this test can be used) for the duration of the ?COVID-19 declaration under Section 564(b)(1) of the Act, 21 U.S.C. ?section 360bbb-3(b)(1), unless the authorization is terminated or ?revoked. ? ?Performed at [facility], [place], ?[place] [address] ?  ?Blood Culture (routine x 2)     Status: None (Preliminary result)  ? Collection Time: 05/31/21 10:18 AM  ? Specimen: BLOOD  ?Result Value Ref Range Status  ? [place]

## 2021-06-01 NOTE — ED Notes (Signed)
Pt resting comfortably. Explained to patient that she had a morning medication and that I would be moving her to a different room after her medication finished. Pt has no complaints at this time. Call bell within reach. Pt at lowest position.  ?

## 2021-06-01 NOTE — ED Notes (Signed)
Pt reposition in bed per request, NAD noted at current, no needs, POC updated to pt. Pt verbalized understanding.  Safety in place, call bell in reach, will continue to monitor.  ?

## 2021-06-01 NOTE — ED Notes (Addendum)
Pt appears to be sleeping, lights off, TV remains on for comfort, NAD noted, regular check on pt, remains on monitor, VSS, unlabored RR, skin warm and dry. Safety in place, bed in lowest position, will continue to monitor, call bell in reach for assistance.  ?

## 2021-06-01 NOTE — TOC Initial Note (Signed)
Transition of Care (TOC) - CM/SW Discharge Note ? ? ?Patient Details  ?Name: LILIENNE WEINS ?MRN: [id number] ?Date of Birth: May 13, 1970 ? ?Transition of Care (TOC) CM/SW Contact:  ?Oscar Forman L Carrah Eppolito, LCSWA ?Phone Number: ?06/01/2021, 10:03 AM ? ? ?Clinical Narrative:    ? ?TOC consult for substance use concerns. CSW attached both residential and outpatient substance use resources to patient's AVS due to patient being on contact precautions. ? ?No further TOC needs at this time.  ? ?  ?  ? ? ?Patient Goals and CMS Choice ?  ?  ?  ? ?Discharge Placement ?  ?           ?  ?  ?  ?  ? ?Discharge Plan and Services ?  ?  ?           ?  ?  ?  ?  ?  ?  ?  ?  ?  ?  ? ?Social Determinants of Health (SDOH) Interventions ?  ? ? ?Readmission Risk Interventions ?   ? View : No data to display.  ?  ?  ?  ? ? ? ? ? ?

## 2021-06-01 NOTE — ED Notes (Addendum)
Pt continues to sleep. TV remains on for comfort, NAD noted, regular check on pt, remains on monitor, VSS, unlabored RR, skin warm and dry. Safety in place, bed in lowest position, Will continue to monitor, call bell in reach for assistance.  ?

## 2021-06-01 NOTE — ED Notes (Signed)
Pt continues to sleep, NAD noted, regular check on pt, remains on monitor, VSS, unlabored RR, skin warm and dry. Safety in place, bed in lowest position, will continue to monitor, call bell in reach for assistance.  ?

## 2021-06-02 LAB — COMPREHENSIVE METABOLIC PANEL
ALT: 25 U/L (ref 0–44)
AST: 27 U/L (ref 15–41)
Albumin: 3 g/dL — ABNORMAL LOW (ref 3.5–5.0)
Alkaline Phosphatase: 89 U/L (ref 38–126)
Anion gap: 7 (ref 5–15)
BUN: 11 mg/dL (ref 6–20)
CO2: 25 mmol/L (ref 22–32)
Calcium: 8.5 mg/dL — ABNORMAL LOW (ref 8.9–10.3)
Chloride: 107 mmol/L (ref 98–111)
Creatinine, Ser: 0.68 mg/dL (ref 0.44–1.00)
GFR, Estimated: >60 mL/min (ref >60)
Glucose, Bld: 84 mg/dL (ref 70–99)
Potassium: 4.7 mmol/L (ref 3.5–5.1)
Sodium: 139 mmol/L (ref 135–145)
Total Bilirubin: 0.7 mg/dL (ref 0.3–1.2)
Total Protein: 6.9 g/dL (ref 6.5–8.1)

## 2021-06-02 LAB — BLOOD GAS, ARTERIAL
Acid-Base Excess: 0.8 mmol/L (ref 0.0–2.0)
Bicarbonate: 25.3 mmol/L (ref 20.0–28.0)
O2 Content: 8 L/min
O2 Saturation: 100 %
Patient temperature: 37
pCO2 arterial: 39 mmHg (ref 32–48)
pH, Arterial: 7.42 (ref 7.35–7.45)
pO2, Arterial: 131 mmHg — ABNORMAL HIGH (ref 83–108)

## 2021-06-02 LAB — CBC
HCT: 39 % (ref 36.0–46.0)
Hemoglobin: 11.5 g/dL — ABNORMAL LOW (ref 12.0–15.0)
MCH: 21.2 pg — ABNORMAL LOW (ref 26.0–34.0)
MCHC: 29.5 g/dL — ABNORMAL LOW (ref 30.0–36.0)
MCV: 72 fL — ABNORMAL LOW (ref 80.0–100.0)
Platelets: 258 10*3/uL (ref 150–400)
RBC: 5.42 MIL/uL — ABNORMAL HIGH (ref 3.87–5.11)
RDW: 21 % — ABNORMAL HIGH (ref 11.5–15.5)
WBC: 9.2 10*3/uL (ref 4.0–10.5)
nRBC: 0 % (ref 0.0–0.2)

## 2021-06-02 LAB — URINE CULTURE: Culture: NO GROWTH

## 2021-06-02 LAB — GLUCOSE, CAPILLARY: Glucose-Capillary: 97 mg/dL (ref 70–99)

## 2021-06-02 LAB — PROCALCITONIN: Procalcitonin: 0.67 ng/mL

## 2021-06-02 MED ORDER — VANCOMYCIN HCL 1500 MG/300ML IV SOLN
1500.0000 mg | INTRAVENOUS | Status: DC
  Filled 2021-06-02: qty 300

## 2021-06-02 MED ORDER — IPRATROPIUM-ALBUTEROL 0.5-2.5 (3) MG/3ML IN SOLN
3.0000 mL | Freq: Three times a day (TID) | RESPIRATORY_TRACT | Status: DC
  Administered 2021-06-03 – 2021-06-05 (×7): 3 mL via RESPIRATORY_TRACT
  Filled 2021-06-02 (×7): qty 3

## 2021-06-02 NOTE — Consult Note (Signed)
Pharmacy Antibiotic Note ? ?Brenda [name] is a 51 y.o. 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  Pharmacy has been consulted for Vancomycin and Cefepime dosing. ? ?Day 3 of antibiotics. Will continue to treat empirically ? ?Plan: ?Vancomycin 1500mg  q24h ?Goal AUC 400-600  ?Est AUC: 480.4; Cmax: 38.3; Cmin: 9.6 ?SCr 0.8(actual 0.68); Vd 0.5 (BMI > 40) ?Cefepime 2g IV Q8H ? ?Height: 5' 2.5" (158.8 cm) ?Weight: 101.5 kg (223 lb 12.3 oz) ?IBW/kg (Calculated) : 51.25 ? ?Temp (24hrs), Avg:98.6 ?F (37 ?C), Min:98 ?F (36.7 ?C), Max:99.4 ?F (37.4 ?C) ? ?Recent Labs  ?Lab 05/31/21 ?0923 05/31/21 ?1018 05/31/21 ?1520 06/01/21 ?0426 06/02/21 ?0407  ?WBC 14.7*  --   --  11.7* 9.2  ?CREATININE 0.90  --   --  0.72 0.68  ?LATICACIDVEN  --  1.4 1.2  --   --   ? ?  ?Estimated Creatinine Clearance: 95.9 mL/min (by C-G formula based on SCr of 0.68 mg/dL).   ? ?Allergies  ?Allergen Reactions  ? Flexeril [Cyclobenzaprine] Other (See Comments)  ?  RLS-like symptoms   ? Quetiapine Fumarate Other (See Comments)  ?  RLS-like symptoms only at doses greater than 100mg  per patient.  ? ? ?Antimicrobials this admission: ?VAN/CFp (5/05 >>  ? ?Dose adjustments this admission: ?Vanc adjusted from 1g Q12h to 1500mg  Q24h ? ?Microbiology results: ?5/05 BCx: NGTD ?5/05 Resp cx: pending  ?5/05 Cov/Flu: negative  ?5/05 MRSA PCR: negative ? ?Thank you for allowing pharmacy to be a part of this patient?s care. ? ?7/05 ?06/02/2021 10:28 AM ? ?

## 2021-06-02 NOTE — Plan of Care (Signed)
?  Problem: Education: ?Goal: Knowledge of disease or condition will improve ?Outcome: Progressing ?Goal: Knowledge of the prescribed therapeutic regimen will improve ?Outcome: Progressing ?Goal: Individualized Educational Video(s) ?Outcome: Progressing ?  ?Problem: Activity: ?Goal: Ability to tolerate increased activity will improve ?Outcome: Progressing ?Goal: Will verbalize the importance of balancing activity with adequate rest periods ?Outcome: Progressing ?  ?Problem: Respiratory: ?Goal: Ability to maintain a clear airway will improve ?Outcome: Progressing ?Goal: Levels of oxygenation will improve ?Outcome: Progressing ?Goal: Ability to maintain adequate ventilation will improve ?Outcome: Progressing ?  ?Problem: Activity: ?Goal: Ability to tolerate increased activity will improve ?Outcome: Progressing ?  ?Problem: Clinical Measurements: ?Goal: Ability to maintain a body temperature in the normal range will improve ?Outcome: Progressing ?  ?Problem: Respiratory: ?Goal: Ability to maintain adequate ventilation will improve ?Outcome: Progressing ?Goal: Ability to maintain a clear airway will improve ?Outcome: Progressing ?  ?

## 2021-06-02 NOTE — Progress Notes (Signed)
?PROGRESS NOTE ? ? ? Brenda [name]  ACZ:[id number] DOB: April 19, 1970 DOA: 05/31/2021 ?PCP: Langston Reusing, NP  ? ? ?Assessment & Plan: ?  ?Principal Problem: ?  Multifocal pneumonia ?Active Problems: ?  Sepsis ([place]) ?  Acute respiratory failure with hypoxia ([place]) ?  Cocaine use disorder, moderate, dependence ([place]) ?  Alcohol abuse ?  Tobacco use disorder ?  Polysubstance abuse ([place]) ?  HTN (hypertension) ?  Depression with anxiety ?  Chest pain ?  UTI (urinary tract infection) ?  Discitis of thoracic region ?  COPD (chronic obstructive pulmonary disease) ([place]) ?  Elevated troponin ? ?Assessment and Plan: ?Multifocal pneumonia: as per CTA chest. Continue on IV cefepime, vanco, bronchodilators & encourage incentive spirometry. Strep is neg and legionella is pending. Blood cxs NGTD.  ? ?Acute hypoxic respiratory failure: found to be 77% on RA. Secondary to above. Continue on supplemental oxygen and wean as tolerated  ?  ?Sepsis: met criteria w/ leukocytosis, tachycardia, tachypnea & discitis of thoracic region. Continue on IV abxs  ? ?COPD: w/o excerbation. Continue on bronchodilators  ?  ?Discitis of thoracic region: MRI showed appearance favors indolent or acute on chronic process. Continue on IV vanco, cefepime. Had this problem last year & was treated w/ abxs  ? ?T8 compression fracture: continue w/ supportive care   ? ?Possible UTI: urine growth shows no growth but urine cx was collected after IV abxs were started  ?  ?Chest pain: w/ elevated troponins likely due to demand ischemia. CTA chest neg for PE. Resolved  ?  ?Depression: severity unknown. Continue on home dose of fluoxetine.  ? ?HTN: continue to hold all anti-HTN meds as BP is low normal  ? ?Tobacco use disorder: received smoking cessation counseling. Nicotine patch to prevent w/drawal  ? ?Alcohol abuse: continue on CIWA protocol. Received alcohol cessation counseling  ? ?Illicit drug use: urine screen positive for benzos, amphetamines. Also uses  cocaine. Received illicit drug use cessation counseling  ?  ? ? ? ? ? ?DVT prophylaxis: heparin ?Code Status: full  ?Family Communication:  ?Disposition Plan: depends on PT/OT recs (not consulted yet). ? ?Level of care: Stepdown ? ?Status is: Inpatient ?Remains inpatient appropriate because: requiring IV abxs ? ? ? ?Consultants:  ? ? ?Procedures:  ? ?Antimicrobials: cefepime, vanco  ? ? ?Subjective: ?Pt still c/o back pain  ? ?Objective: ?Vitals:  ? 06/02/21 0400 06/02/21 0500 06/02/21 0600 06/02/21 0705  ?BP: 117/81 119/83 115/76   ?Pulse: 98 98 90   ?Resp: (!) 44 (!) 40 (!) 38   ?Temp: 98 ?F (36.7 ?C)     ?TempSrc: Oral     ?SpO2: 92% 93% 93% 95%  ?Weight:  101.5 kg    ?Height:      ? ? ?Intake/Output Summary (Last 24 hours) at 06/02/2021 0805 ?Last data filed at 06/02/2021 1093 ?Gross per 24 hour  ?Intake --  ?Output 200 ml  ?Net -200 ml  ? ?Filed Weights  ? 05/31/21 0912 06/01/21 1840 06/02/21 0500  ?Weight: 104.3 kg 101.9 kg 101.5 kg  ? ? ?Examination: ? ?General exam: Appears calm & comfortable  ?Respiratory system: clear breath sounds b/l  ?Cardiovascular system: S1/S2+. No rubs or clicks  ?Gastrointestinal system: Abd is soft, NT, obese & hypoactive bowel sounds  ?Central nervous system: Oriented x 3. Moves all extremities  ?Psychiatry: judgement and insight appears poor. Flat mood and affect ? ? ? ?Data Reviewed: I have personally reviewed following labs and imaging studies ? ?CBC: ?  Recent Labs  ?Lab 05/31/21 ?9381 06/01/21 ?0426 06/02/21 ?0407  ?WBC 14.7* 11.7* 9.2  ?HGB 12.9 12.4 11.5*  ?HCT 42.5 42.1 39.0  ?MCV 70.6* 73.0* 72.0*  ?PLT 267 291 258  ? ?Basic Metabolic Panel: ?Recent Labs  ?Lab 05/31/21 ?0923 05/31/21 ?1520 06/01/21 ?0426 06/02/21 ?0407  ?NA 135  --  137 139  ?K 3.0*  --  4.8 4.7  ?CL 103  --  107 107  ?CO2 21*  --  24 25  ?GLUCOSE 135*  --  89 84  ?BUN 11  --  9 11  ?CREATININE 0.90  --  0.72 0.68  ?CALCIUM 8.6*  --  8.5* 8.5*  ?MG  --  1.9  --   --   ? ?GFR: ?Estimated Creatinine Clearance:  95.9 mL/min (by C-G formula based on SCr of 0.68 mg/dL). ?Liver Function Tests: ?Recent Labs  ?Lab 05/31/21 ?0923 06/02/21 ?0407  ?AST 44* 27  ?ALT 34 25  ?ALKPHOS 113 89  ?BILITOT 0.5 0.7  ?PROT 7.3 6.9  ?ALBUMIN 3.3* 3.0*  ? ?No results for input(s): LIPASE, AMYLASE in the last 168 hours. ?No results for input(s): AMMONIA in the last 168 hours. ?Coagulation Profile: ?No results for input(s): INR, PROTIME in the last 168 hours. ?Cardiac Enzymes: ?No results for input(s): CKTOTAL, CKMB, CKMBINDEX, TROPONINI in the last 168 hours. ?BNP (last 3 results) ?No results for input(s): PROBNP in the last 8760 hours. ?HbA1C: ?No results for input(s): HGBA1C in the last 72 hours. ?CBG: ?Recent Labs  ?Lab 06/01/21 ?1837  ?GLUCAP 111*  ? ?Lipid Profile: ?Recent Labs  ?  05/31/21 ?1520  ?CHOL 98  ?HDL 28*  ?[place] 50  ?TRIG 99  ?CHOLHDL 3.5  ? ?Thyroid Function Tests: ?No results for input(s): TSH, T4TOTAL, FREET4, T3FREE, THYROIDAB in the last 72 hours. ?Anemia Panel: ?No results for input(s): VITAMINB12, FOLATE, FERRITIN, TIBC, IRON, RETICCTPCT in the last 72 hours. ?Sepsis Labs: ?Recent Labs  ?Lab 05/31/21 ?1018 05/31/21 ?1030 05/31/21 ?1520 06/01/21 ?0426 06/02/21 ?0407  ?PROCALCITON  --  2.15  --  0.94 0.67  ?LATICACIDVEN 1.4  --  1.2  --   --   ? ? ?Recent Results (from the past 240 hour(s))  ?Resp Panel by RT-PCR (Flu A&B, Covid) Nasopharyngeal Swab     Status: None  ? Collection Time: 05/31/21  9:23 AM  ? Specimen: Nasopharyngeal Swab; Nasopharyngeal(NP) swabs in vial transport medium  ?Result Value Ref Range Status  ? SARS Coronavirus 2 by RT PCR NEGATIVE NEGATIVE Final  ?  Comment: (NOTE) ?SARS-CoV-2 target nucleic acids are NOT DETECTED. ? ?The SARS-CoV-2 RNA is generally detectable in upper respiratory ?specimens during the acute phase of infection. The lowest ?concentration of SARS-CoV-2 viral copies this assay can detect is ?138 copies/mL. A negative result does not preclude SARS-Cov-2 ?infection and should not be  used as the sole basis for treatment or ?other patient management decisions. A negative result may occur with  ?improper specimen collection/handling, submission of specimen other ?than nasopharyngeal swab, presence of viral mutation(s) within the ?areas targeted by this assay, and inadequate number of viral ?copies(<138 copies/mL). A negative result must be combined with ?clinical observations, patient history, and epidemiological ?information. The expected result is Negative. ? ?Fact Sheet for Patients:  ?EntrepreneurPulse.com.au ? ?Fact Sheet for Healthcare Providers:  ?IncredibleEmployment.be ? ?This test is no t yet approved or cleared by the [place] FDA and  ?has been authorized for detection and/or diagnosis of SARS-CoV-2 by ?FDA under an Emergency Use  Authorization (EUA). This EUA will remain  ?in effect (meaning this test can be used) for the duration of the ?COVID-19 declaration under Section 564(b)(1) of the Act, 21 ?U.S.C.section 360bbb-3(b)(1), unless the authorization is terminated  ?or revoked sooner.  ? ? ?  ? Influenza A by PCR NEGATIVE NEGATIVE Final  ? Influenza B by PCR NEGATIVE NEGATIVE Final  ?  Comment: (NOTE) ?The Xpert Xpress SARS-CoV-2/FLU/RSV plus assay is intended as an aid ?in the diagnosis of influenza from Nasopharyngeal swab specimens and ?should not be used as a sole basis for treatment. Nasal washings and ?aspirates are unacceptable for Xpert Xpress SARS-CoV-2/FLU/RSV ?testing. ? ?Fact Sheet for Patients: ?EntrepreneurPulse.com.au ? ?Fact Sheet for Healthcare Providers: ?IncredibleEmployment.be ? ?This test is not yet approved or cleared by the [place] FDA and ?has been authorized for detection and/or diagnosis of SARS-CoV-2 by ?FDA under an Emergency Use Authorization (EUA). This EUA will remain ?in effect (meaning this test can be used) for the duration of the ?COVID-19 declaration under Section  564(b)(1) of the Act, 21 U.S.C. ?section 360bbb-3(b)(1), unless the authorization is terminated or ?revoked. ? ?Performed at [facility], [place], ?[place] [address] ?  ?[facility]

## 2021-06-03 LAB — COMPREHENSIVE METABOLIC PANEL
ALT: 21 U/L (ref 0–44)
AST: 23 U/L (ref 15–41)
Albumin: 2.7 g/dL — ABNORMAL LOW (ref 3.5–5.0)
Alkaline Phosphatase: 73 U/L (ref 38–126)
Anion gap: 4 — ABNORMAL LOW (ref 5–15)
BUN: 12 mg/dL (ref 6–20)
CO2: 27 mmol/L (ref 22–32)
Calcium: 8.1 mg/dL — ABNORMAL LOW (ref 8.9–10.3)
Chloride: 105 mmol/L (ref 98–111)
Creatinine, Ser: 0.68 mg/dL (ref 0.44–1.00)
GFR, Estimated: >60 mL/min (ref >60)
Glucose, Bld: 93 mg/dL (ref 70–99)
Potassium: 4 mmol/L (ref 3.5–5.1)
Sodium: 136 mmol/L (ref 135–145)
Total Bilirubin: 0.7 mg/dL (ref 0.3–1.2)
Total Protein: 6.6 g/dL (ref 6.5–8.1)

## 2021-06-03 LAB — CULTURE, RESPIRATORY W GRAM STAIN
Culture: NORMAL
Gram Stain: NONE SEEN

## 2021-06-03 LAB — HEMOGLOBIN A1C
Hgb A1c MFr Bld: 5 % (ref 4.8–5.6)
Mean Plasma Glucose: 96.8 mg/dL

## 2021-06-03 LAB — GLUCOSE, CAPILLARY: Glucose-Capillary: 87 mg/dL (ref 70–99)

## 2021-06-03 LAB — CBC
HCT: 37 % (ref 36.0–46.0)
Hemoglobin: 11.1 g/dL — ABNORMAL LOW (ref 12.0–15.0)
MCH: 21.5 pg — ABNORMAL LOW (ref 26.0–34.0)
MCHC: 30 g/dL (ref 30.0–36.0)
MCV: 71.7 fL — ABNORMAL LOW (ref 80.0–100.0)
Platelets: 272 10*3/uL (ref 150–400)
RBC: 5.16 MIL/uL — ABNORMAL HIGH (ref 3.87–5.11)
RDW: 20.9 % — ABNORMAL HIGH (ref 11.5–15.5)
WBC: 6.8 10*3/uL (ref 4.0–10.5)
nRBC: 0 % (ref 0.0–0.2)

## 2021-06-03 LAB — C-REACTIVE PROTEIN: CRP: 11.9 mg/dL — ABNORMAL HIGH (ref <1.0)

## 2021-06-03 MED ORDER — PNEUMOCOCCAL 20-VAL CONJ VACC 0.5 ML IM SUSY
0.5000 mL | PREFILLED_SYRINGE | INTRAMUSCULAR | Status: DC
  Filled 2021-06-03: qty 0.5

## 2021-06-03 MED ORDER — VANCOMYCIN HCL 1.5 G IV SOLR
1500.0000 mg | INTRAVENOUS | Status: DC
  Administered 2021-06-03 – 2021-06-05 (×3): 1500 mg via INTRAVENOUS
  Filled 2021-06-03 (×3): qty 30

## 2021-06-03 MED ORDER — QUETIAPINE FUMARATE 25 MG PO TABS
50.0000 mg | ORAL_TABLET | Freq: Every day | ORAL | Status: DC
  Administered 2021-06-03 – 2021-06-04 (×2): 50 mg via ORAL
  Filled 2021-06-03 (×2): qty 2

## 2021-06-03 NOTE — Progress Notes (Signed)
Patient transferred to room 222. All belongings sent with patient. Report called to [facility] on [place]. Patient declined for RN to notify anyone of her transfer. ?

## 2021-06-03 NOTE — Evaluation (Signed)
Occupational Therapy Evaluation ?Patient Details ?Name: Brenda [name] ?MRN: [id number] ?DOB: Oct 28, 1970 ?Today's Date: 06/03/2021 ? ? ?History of Present Illness Pt is a 51 y/o F admitted on 05/31/21 after presenting to the ED with c/o chest pain & SOB. Pt is being treated for acute respiratory failure with hypoxia & sepsis 2/2 multifocal PNA. PMH: polysubstance abuse, IV drug user, HTN, COPD, asthma, GERD, depression, anxiety, HCV, bipolar, alcohol abuse, tobacco abuse, dCHF, discitis of T-spine  ? ?Clinical Impression ?  ?Patient presenting with decreased Ind in self care, balance, functional mobility/transfers, endurance, and safety awareness. Patient reports being mod I with use of rollator for functional mobility and self care tasks. Pt's adults children live with her and daughter is available 24/7 per pt report.  Patient currently functioning at min guard - min A. Patient will benefit from acute OT to increase overall independence in the areas of ADLs, functional mobility, and safety awareness in order to safely discharge home with family.   ? ?Recommendations for follow up therapy are one component of a multi-disciplinary discharge planning process, led by the attending physician.  Recommendations may be updated based on patient status, additional functional criteria and insurance authorization.  ? ?Follow Up Recommendations ? Home health OT  ?  ?Assistance Recommended at Discharge Intermittent Supervision/Assistance  ?Patient can return home with the following A little help with walking and/or transfers;A little help with bathing/dressing/bathroom;Help with stairs or ramp for entrance;Assistance with cooking/housework ? ?  ?Functional Status Assessment ? Patient has had a recent decline in their functional status and demonstrates the ability to make significant improvements in function in a reasonable and predictable amount of time.  ?Equipment Recommendations ? None recommended by OT  ?  ?   ?Precautions /  Restrictions Precautions ?Precautions: Fall ?Restrictions ?Weight Bearing Restrictions: No  ? ?  ? ?Mobility Bed Mobility ?Overal bed mobility: Modified Independent ?  ?  ?  ?  ?  ?  ?General bed mobility comments: increased effort but no physical assistance ?  ? ?Transfers ?Overall transfer level: Needs assistance ?Equipment used: None, 1 person hand held assist ?Transfers: Sit to/from Stand ?Sit to Stand: Min guard, Min assist ?  ?  ?  ?  ?  ?  ?  ? ?  ?Balance Overall balance assessment: Needs assistance, History of Falls ?Sitting-balance support: Feet supported, Bilateral upper extremity supported ?Sitting balance-Leahy Scale: Good ?  ?  ?Standing balance support: During functional activity, No upper extremity supported ?Standing balance-Leahy Scale: Poor ?  ?  ?  ?  ?  ?  ?  ?  ?  ?  ?  ?  ?   ? ?ADL either performed or assessed with clinical judgement  ? ?ADL Overall ADL's : Needs assistance/impaired ?  ?  ?Grooming: Wash/dry hands;Sitting;Set up;Supervision/safety ?  ?  ?  ?  ?  ?  ?  ?Lower Body Dressing: Minimal assistance ?  ?Toilet Transfer: Minimal assistance ?Toilet Transfer Details (indicate cue type and reason): simulated ?  ?  ?  ?  ?  ?   ? ? ? ?Vision Patient Visual Report: No change from baseline ?   ?   ?   ?   ? ?Pertinent Vitals/Pain Pain Assessment ?Pain Assessment: 0-10 ?Faces Pain Scale: Hurts a little bit ?Pain Location: back ?Pain Descriptors / Indicators: Discomfort ?Pain Intervention(s): Monitored during session, Repositioned  ? ? ? ?Hand Dominance Right ?  ?Extremity/Trunk Assessment Upper Extremity Assessment ?Upper Extremity Assessment: Overall [facility]  for tasks assessed ?  ?Lower Extremity Assessment ?Lower Extremity Assessment: Generalized weakness ?  ?Cervical / Trunk Assessment ?Cervical / Trunk Assessment: Normal ?  ?Communication Communication ?Communication: No difficulties ?  ?Cognition Arousal/Alertness: Awake/alert ?Behavior During Therapy: [facility] for tasks  assessed/performed ?Overall Cognitive Status: Within Functional Limits for tasks assessed ?  ?  ?  ?  ?  ?  ?  ?  ?  ?  ?  ?  ?  ?  ?  ?  ?  ?  ?  ?   ?   ?   ? ? ?Home Living Family/patient expects to be discharged to:: Private residence ?Living Arrangements: Children ?Available Help at Discharge: Available 24 hours/day;Family ?Type of Home: Mobile home ?Home Access: Stairs to enter ?Entrance Stairs-Number of Steps: 3 ?Entrance Stairs-Rails: Right;Left;Can reach both ?Home Layout: One level ?  ?  ?Bathroom Shower/Tub: Tub/shower unit ?  ?Bathroom Toilet: Standard ?  ?  ?Home Equipment: Rollator (4 wheels) ?  ?  ?  ? ?  ?Prior Functioning/Environment Prior Level of Function : Independent/Modified Independent ?  ?  ?  ?  ?  ?  ?Mobility Comments: Pt reports she holds to walls in the home, uses rollator outside of the home, endorses 4 falls in past 6 months 2/2 lightheadedness or L ankle "giving out", sometimes drives. ?ADLs Comments: Pt reports independence ?  ? ?  ?  ?OT Problem List: Decreased strength;Decreased activity tolerance;Impaired balance (sitting and/or standing);Decreased safety awareness;Decreased knowledge of use of DME or AE;Cardiopulmonary status limiting activity ?  ?   ?OT Treatment/Interventions: Self-care/ADL training;Therapeutic exercise;Therapeutic activities;Neuromuscular education;Energy conservation;Visual/perceptual remediation/compensation;DME and/or AE instruction;Patient/family education;Manual therapy;Balance training  ?  ?OT Goals(Current goals can be found in the care plan section) Acute Rehab OT Goals ?Patient Stated Goal: to go home ?OT Goal Formulation: With patient ?Time For Goal Achievement: 06/17/21 ?Potential to Achieve Goals: Good ?ADL Goals ?Pt Will Perform Grooming: with modified independence;standing ?Pt Will Perform Lower Body Dressing: with modified independence;sit to/from stand ?Pt Will Transfer to Toilet: with modified independence;ambulating ?Pt Will Perform Toileting  - Clothing Manipulation and hygiene: with modified independence;sit to/from stand  ?OT Frequency: Min 2X/week ?  ? ?   ?AM-PAC OT "6 Clicks" Daily Activity     ?Outcome Measure Help from another person eating meals?: None ?Help from another person taking care of personal grooming?: A Little ?Help from another person toileting, which includes using toliet, bedpan, or urinal?: A Little ?Help from another person bathing (including washing, rinsing, drying)?: A Little ?Help from another person to put on and taking off regular upper body clothing?: None ?Help from another person to put on and taking off regular lower body clothing?: A Little ?6 Click Score: 20 ?  ?End of Session Nurse Communication: Mobility status ? ?Activity Tolerance: Patient tolerated treatment well ?Patient left: in bed;with call bell/phone within reach;with bed alarm set ? ?OT Visit Diagnosis: Unsteadiness on feet (R26.81);Repeated falls (R29.6)  ?              ?Time: 0722-5750 ?OT Time Calculation (min): 15 min ?Charges:  OT General Charges ?$OT Visit: 1 Visit ?OT Evaluation ?$OT Eval Moderate Complexity: 1 Mod ? ?Jackquline Denmark, MS, OTR/L , CBIS ?ascom [phone]  ?06/03/21, 3:32 PM  ?

## 2021-06-03 NOTE — Evaluation (Signed)
Physical Therapy Evaluation ?Patient Details ?Name: Brenda [name] ?MRN: [id number] ?DOB: 25-Aug-1970 ?Today's Date: 06/03/2021 ? ?History of Present Illness ? Pt is a 51 y/o F admitted on 05/31/21 after presenting to the ED with c/o chest pain & SOB. Pt is being treated for acute respiratory failure with hypoxia & sepsis 2/2 multifocal PNA. PMH: polysubstance abuse, IV drug user, HTN, COPD, asthma, GERD, depression, anxiety, HCV, bipolar, alcohol abuse, tobacco abuse, dCHF, discitis of T-spine  ?Clinical Impression ? Pt seen for PT evaluation with pt agreeable to tx. On this date, pt is able to complete bed mobility with mod I & stand pivot transfers with min assist. Pt progresses to ambulating laps in room without AD & min assist & is instructed to sit when pt appears to have more labored breathing. Pt educated on importance of OOB mobility & purpose of PT while in acute setting. Will continue to follow pt acutely to address balance, endurance, and gait with LRAD. ?   ?Pt on 3L/min via nasal cannula throughout session. O2 tubing disconnected when pt transferred to [facility] & SPO2 dropped to 82% on room air but quickly recovered once on 3L/min. ?After gait SPO2 did drop to 88% on 3L/min but pt quickly recovered with seated rest break. ?BP on BSC 119/82 mmHg MAP 94  ? ?Recommendations for follow up therapy are one component of a multi-disciplinary discharge planning process, led by the attending physician.  Recommendations may be updated based on patient status, additional functional criteria and insurance authorization. ? ?Follow Up Recommendations Home health PT ? ?  ?Assistance Recommended at Discharge Intermittent Supervision/Assistance  ?Patient can return home with the following ? A little help with walking and/or transfers;A little help with bathing/dressing/bathroom;Assistance with cooking/housework;Assist for transportation;Direct supervision/assist for medications management;Help with stairs or ramp for  entrance ? ?  ?Equipment Recommendations None recommended by PT  ?Recommendations for Other Services ?    ?  ?Functional Status Assessment Patient has had a recent decline in their functional status and demonstrates the ability to make significant improvements in function in a reasonable and predictable amount of time.  ? ?  ?Precautions / Restrictions Precautions ?Precautions: Fall ?Restrictions ?Weight Bearing Restrictions: No  ? ?  ? ?Mobility ? Bed Mobility ?Overal bed mobility: Modified Independent ?  ?  ?  ?  ?  ?  ?General bed mobility comments: supine<>sit with HOB slightly elevated ?  ? ?Transfers ?Overall transfer level: Needs assistance ?Equipment used: None, 1 person hand held assist ?Transfers: Sit to/from Stand, Bed to chair/wheelchair/BSC ?Sit to Stand: Min guard ?  ?Step pivot transfers: Min assist ?  ?  ?  ?  ?  ? ?Ambulation/Gait ?Ambulation/Gait assistance: Min assist, Supervision ?Gait Distance (Feet): 50 Feet (3 laps to door & back in room without AD) ?Assistive device: None ?Gait Pattern/deviations: Decreased step length - left, Decreased step length - right, Decreased stride length ?Gait velocity: decreased ?  ?  ?  ? ?Stairs ?  ?  ?  ?  ?  ? ?Wheelchair Mobility ?  ? ?Modified Rankin (Stroke Patients Only) ?  ? ?  ? ?Balance Overall balance assessment: Needs assistance, History of Falls ?Sitting-balance support: Feet supported, Bilateral upper extremity supported ?Sitting balance-Leahy Scale: Good ?  ?  ?Standing balance support: During functional activity, No upper extremity supported ?Standing balance-Leahy Scale: Poor ?  ?  ?  ?  ?  ?  ?  ?  ?  ?  ?  ?  ?   ? ? ? ?  Pertinent Vitals/Pain Pain Assessment ?Pain Assessment: Faces ?Faces Pain Scale: Hurts a little bit ("not too bad" right now) ?Pain Location: back ?Pain Descriptors / Indicators: Discomfort ?Pain Intervention(s): Monitored during session  ? ? ?Home Living Family/patient expects to be discharged to:: Private residence ?Living  Arrangements: Children ?Available Help at Discharge: Available 24 hours/day;Family ?Type of Home: Mobile home ?Home Access: Stairs to enter ?Entrance Stairs-Rails: Right;Left;Can reach both ?Entrance Stairs-Number of Steps: 3 ?  ?Home Layout: One level ?Home Equipment: Rollator (4 wheels) ?   ?  ?Prior Function Prior Level of Function : Independent/Modified Independent ?  ?  ?  ?  ?  ?  ?Mobility Comments: Pt reports she holds to walls in the home, uses rollator outside of the home, endorses 4 falls in past 6 months 2/2 lightheadedness or L ankle "giving out", sometimes drives. ?  ?  ? ? ?Hand Dominance  ?   ? ?  ?Extremity/Trunk Assessment  ? Upper Extremity Assessment ?Upper Extremity Assessment: Overall WFL for tasks assessed ?  ? ?Lower Extremity Assessment ?Lower Extremity Assessment: Generalized weakness ?  ? ?   ?Communication  ? Communication: No difficulties  ?Cognition Arousal/Alertness: Awake/alert ?Behavior During Therapy: [facility] for tasks assessed/performed ?Overall Cognitive Status: Within Functional Limits for tasks assessed ?  ?  ?  ?  ?  ?  ?  ?  ?  ?  ?  ?  ?  ?  ?  ?  ?  ?  ?  ? ?  ?General Comments General comments (skin integrity, edema, etc.): Pt with continent void on [facility] & left on toilet at end of session as pt reported need to have BM. ? ?  ?Exercises Other Exercises ?Other Exercises: Educated pt on importance of OOB mobility & gait while in acute setting. Also educated her on recommendation to use AD for mobility vs holding to walls as AD is more stable.  ? ?Assessment/Plan  ?  ?PT Assessment Patient needs continued PT services  ?PT Problem List Decreased strength;Decreased mobility;Decreased activity tolerance;Cardiopulmonary status limiting activity;Decreased balance;Pain;Decreased knowledge of use of DME ? ?   ?  ?PT Treatment Interventions Therapeutic activities;Modalities;DME instruction;Gait training;Therapeutic exercise;Stair training;Balance training;Patient/family education;Functional  mobility training;Manual techniques;Neuromuscular re-education   ? ?PT Goals (Current goals can be found in the Care Plan section)  ?Acute Rehab PT Goals ?Patient Stated Goal: get better ?PT Goal Formulation: With patient ?Time For Goal Achievement: 06/17/21 ?Potential to Achieve Goals: Good ? ?  ?Frequency Min 2X/week ?  ? ? ?Co-evaluation   ?  ?  ?  ?  ? ? ?  ?AM-PAC PT "6 Clicks" Mobility  ?Outcome Measure Help needed turning from your back to your side while in a flat bed without using bedrails?: None ?Help needed moving from lying on your back to sitting on the side of a flat bed without using bedrails?: None ?Help needed moving to and from a bed to a chair (including a wheelchair)?: A Little ?Help needed standing up from a chair using your arms (e.g., wheelchair or bedside chair)?: A Little ?Help needed to walk in hospital room?: A Little ?Help needed climbing 3-5 steps with a railing? : A Little ?6 Click Score: 20 ? ?  ?End of Session Equipment Utilized During Treatment: Oxygen ?Activity Tolerance: Patient tolerated treatment well ?Patient left:  (on toilet with call bell in hand - nurse aware & pt aware of need to call for assistance once finished) ?Nurse Communication: Mobility status (O2) ?PT Visit Diagnosis:  Unsteadiness on feet (R26.81);Muscle weakness (generalized) (M62.81);Difficulty in walking, not elsewhere classified (R26.2) ?  ? ?Time: 4782-95621019-1041 ?PT Time Calculation (min) (ACUTE ONLY): 22 min ? ? ?Charges:   PT Evaluation ?$PT Eval Moderate Complexity: 1 Mod ?PT Treatments ?$Therapeutic Activity: 8-22 mins ?  ?   ? ? ?Aleda GranaVictoria Elic Vencill, PT, DPT ?06/03/21, 10:52 AM ? ? ?Sandi MariscalVictoria M Kylee Umana ?06/03/2021, 10:50 AM ? ?

## 2021-06-03 NOTE — Progress Notes (Signed)
?PROGRESS NOTE ? ? ? Brenda [name]  GBE:[id number] DOB: Aug 15, 1970 DOA: 05/31/2021 ?PCP: Langston Reusing, NP  ? ? ?Assessment & Plan: ?  ?Principal Problem: ?  Multifocal pneumonia ?Active Problems: ?  Sepsis ([place]) ?  Acute respiratory failure with hypoxia ([place]) ?  Cocaine use disorder, moderate, dependence ([place]) ?  Alcohol abuse ?  Tobacco use disorder ?  Polysubstance abuse ([place]) ?  HTN (hypertension) ?  Depression with anxiety ?  Chest pain ?  UTI (urinary tract infection) ?  Discitis of thoracic region ?  COPD (chronic obstructive pulmonary disease) ([place]) ?  Elevated troponin ? ?Assessment and Plan: ?Multifocal pneumonia: as per CTA chest. Continue on IV vanco, cefepime, bronchodilators & encourage incentive spirometry. Blood cxs NGTD. Legionella is pending and strep is neg  ? ?Acute hypoxic respiratory failure: found to be 77% on RA. Secondary to above. Continue on supplemental oxygen and wean as tolerated  ?  ?Sepsis: met criteria w/ leukocytosis, tachycardia, tachypnea & discitis of thoracic region. Continue on IV abxs. Sepsis resolved  ? ?COPD: w/o exacerbation. Continue on bronchodilators   ?  ?Discitis of thoracic region: MRI showed appearance favors indolent or acute on chronic process. Continue on IV cefepime, vanco. Had this problem last year & was treated w/ abxs  ? ?T8 compression fracture: continue w/ supportive care  ? ?Possible UTI: urine cx shows no growth but urine cx was collected after IV abxs were started  ?  ?Chest pain: w/ elevated troponins likely due to demand ischemia. CTA chest neg for PE. Resolved  ?  ?Depression: severity unknown. Continue on home dose of fluoxetine  ? ?HTN: continue to hold all anti-HTN meds as BP is low normal  ? ?Tobacco use disorder: received smoking cessation counseling. Nicotine patch to prevent w/drawal  ? ?Alcohol abuse: continue on CIWA protocol. Received alcohol cessation counseling  ? ?Illicit drug use: urine screen positive for benzos, amphetamines.  Also uses cocaine. Received illicit drug cessation counseling  ?  ? ? ? ? ? ?DVT prophylaxis: heparin ?Code Status: full  ?Family Communication:  ?Disposition Plan: depends on PT/OT recs  ? ?Level of care: Stepdown ? ?Status is: Inpatient ?Remains inpatient appropriate because: requiring IV abxs ? ? ? ?Consultants:  ? ? ?Procedures:  ? ?Antimicrobials: cefepime, vanco  ? ? ?Subjective: ?Pt c/o back pain   ? ?Objective: ?Vitals:  ? 06/03/21 0500 06/03/21 0600 06/03/21 0700 06/03/21 0725  ?BP: 129/67 110/73 119/72   ?Pulse: (!) 104 84 84   ?Resp: (!) 22 (!) 30 20   ?Temp:      ?TempSrc:      ?SpO2: 95% 94% 92% 97%  ?Weight:      ?Height:      ? ? ?Intake/Output Summary (Last 24 hours) at 06/03/2021 0750 ?Last data filed at 06/03/2021 0700 ?Gross per 24 hour  ?Intake 1251.23 ml  ?Output 1400 ml  ?Net -148.77 ml  ? ?Filed Weights  ? 05/31/21 0912 06/01/21 1840 06/02/21 0500  ?Weight: 104.3 kg 101.9 kg 101.5 kg  ? ? ?Examination: ? ?General exam: Appears comfortable & calm ?Respiratory system: decreased breath sounds b/l ?Cardiovascular system: S1/S2+. No rubs or clicks  ?Gastrointestinal system: Abd is soft, NT, obese & hypoactive bowel sounds  ?Central nervous system: Alert and oriented. Moves all extremities  ?Psychiatry: judgement and insight appears at baseline. Flat mood and affect  ? ? ? ?Data Reviewed: I have personally reviewed following labs and imaging studies ? ?CBC: ?Recent Labs  ?Lab  05/31/21 ?4503 06/01/21 ?8882 06/02/21 ?0407 06/03/21 ?8003  ?WBC 14.7* 11.7* 9.2 6.8  ?HGB 12.9 12.4 11.5* 11.1*  ?HCT 42.5 42.1 39.0 37.0  ?MCV 70.6* 73.0* 72.0* 71.7*  ?PLT 267 291 258 272  ? ?Basic Metabolic Panel: ?Recent Labs  ?Lab 05/31/21 ?0923 05/31/21 ?1520 06/01/21 ?0426 06/02/21 ?0407 06/03/21 ?4917  ?NA 135  --  137 139 136  ?K 3.0*  --  4.8 4.7 4.0  ?CL 103  --  107 107 105  ?CO2 21*  --  '24 25 27  ' ?GLUCOSE 135*  --  89 84 93  ?BUN 11  --  '9 11 12  ' ?CREATININE 0.90  --  0.72 0.68 0.68  ?CALCIUM 8.6*  --  8.5* 8.5*  8.1*  ?MG  --  1.9  --   --   --   ? ?GFR: ?Estimated Creatinine Clearance: 95.9 mL/min (by C-G formula based on SCr of 0.68 mg/dL). ?Liver Function Tests: ?Recent Labs  ?Lab 05/31/21 ?0923 06/02/21 ?0407 06/03/21 ?9150  ?AST 44* 27 23  ?ALT 34 25 21  ?ALKPHOS 113 89 73  ?BILITOT 0.5 0.7 0.7  ?PROT 7.3 6.9 6.6  ?ALBUMIN 3.3* 3.0* 2.7*  ? ?No results for input(s): LIPASE, AMYLASE in the last 168 hours. ?No results for input(s): AMMONIA in the last 168 hours. ?Coagulation Profile: ?No results for input(s): INR, PROTIME in the last 168 hours. ?Cardiac Enzymes: ?No results for input(s): CKTOTAL, CKMB, CKMBINDEX, TROPONINI in the last 168 hours. ?BNP (last 3 results) ?No results for input(s): PROBNP in the last 8760 hours. ?HbA1C: ?Recent Labs  ?  05/31/21 ?1520  ?HGBA1C 5.0  ? ?CBG: ?Recent Labs  ?Lab 06/01/21 ?1837 06/02/21 ?0757  ?GLUCAP 111* 97  ? ?Lipid Profile: ?Recent Labs  ?  05/31/21 ?1520  ?CHOL 98  ?HDL 28*  ?Baker 50  ?TRIG 99  ?CHOLHDL 3.5  ? ?Thyroid Function Tests: ?No results for input(s): TSH, T4TOTAL, FREET4, T3FREE, THYROIDAB in the last 72 hours. ?Anemia Panel: ?No results for input(s): VITAMINB12, FOLATE, FERRITIN, TIBC, IRON, RETICCTPCT in the last 72 hours. ?Sepsis Labs: ?Recent Labs  ?Lab 05/31/21 ?1018 05/31/21 ?1030 05/31/21 ?1520 06/01/21 ?0426 06/02/21 ?0407  ?PROCALCITON  --  2.15  --  0.94 0.67  ?LATICACIDVEN 1.4  --  1.2  --   --   ? ? ?Recent Results (from the past 240 hour(s))  ?Resp Panel by RT-PCR (Flu A&B, Covid) Nasopharyngeal Swab     Status: None  ? Collection Time: 05/31/21  9:23 AM  ? Specimen: Nasopharyngeal Swab; Nasopharyngeal(NP) swabs in vial transport medium  ?Result Value Ref Range Status  ? SARS Coronavirus 2 by RT PCR NEGATIVE NEGATIVE Final  ?  Comment: (NOTE) ?SARS-CoV-2 target nucleic acids are NOT DETECTED. ? ?The SARS-CoV-2 RNA is generally detectable in upper respiratory ?specimens during the acute phase of infection. The lowest ?concentration of SARS-CoV-2 viral  copies this assay can detect is ?138 copies/mL. A negative result does not preclude SARS-Cov-2 ?infection and should not be used as the sole basis for treatment or ?other patient management decisions. A negative result may occur with  ?improper specimen collection/handling, submission of specimen other ?than nasopharyngeal swab, presence of viral mutation(s) within the ?areas targeted by this assay, and inadequate number of viral ?copies(<138 copies/mL). A negative result must be combined with ?clinical observations, patient history, and epidemiological ?information. The expected result is Negative. ? ?Fact Sheet for Patients:  ?EntrepreneurPulse.com.au ? ?Fact Sheet for Healthcare Providers:  ?IncredibleEmployment.be ? ?This test  is no t yet approved or cleared by the [place] FDA and  ?has been authorized for detection and/or diagnosis of SARS-CoV-2 by ?FDA under an Emergency Use Authorization (EUA). This EUA will remain  ?in effect (meaning this test can be used) for the duration of the ?COVID-19 declaration under Section 564(b)(1) of the Act, 21 ?U.S.C.section 360bbb-3(b)(1), unless the authorization is terminated  ?or revoked sooner.  ? ? ?  ? Influenza A by PCR NEGATIVE NEGATIVE Final  ? Influenza B by PCR NEGATIVE NEGATIVE Final  ?  Comment: (NOTE) ?The Xpert Xpress SARS-CoV-2/FLU/RSV plus assay is intended as an aid ?in the diagnosis of influenza from Nasopharyngeal swab specimens and ?should not be used as a sole basis for treatment. Nasal washings and ?aspirates are unacceptable for Xpert Xpress SARS-CoV-2/FLU/RSV ?testing. ? ?Fact Sheet for Patients: ?EntrepreneurPulse.com.au ? ?Fact Sheet for Healthcare Providers: ?IncredibleEmployment.be ? ?This test is not yet approved or cleared by the [place] FDA and ?has been authorized for detection and/or diagnosis of SARS-CoV-2 by ?FDA under an Emergency Use Authorization (EUA). This  EUA will remain ?in effect (meaning this test can be used) for the duration of the ?COVID-19 declaration under Section 564(b)(1) of the Act, 21 U.S.C. ?section 360bbb-3(b)(1), unless the authorization

## 2021-06-03 NOTE — TOC Initial Note (Signed)
Transition of Care (TOC) - Initial/Assessment Note  ? ? ?Patient Details  ?Name: Brenda [name] ?MRN: [id number] ?Date of Birth: Jun 08, 1970 ? ?Transition of Care (TOC) CM/SW Contact:    ?Gildardo Griffes, LCSW ?Phone Number: ?06/03/2021, 11:36 AM ? ?Clinical Narrative:                 ? ?CSW spoke with patient regarding discharge planning, patient agreeable to [facility] PT. Patient confirms she has no insurance, is agreeable to charity [facility] agency. CSW informed her agency would depend on who is on charity rotation at time of discharge, patient expressed understanding.  ? ?Patient reports no DME needs at home, is not on O2 at baseline and hopeful to wean off O2 prior to dc.  ? ?Patient reports she sees [facility] for PCP, reports she usually uses medication management however if she is unable to get there, she will go [facility].  ? ?Patient agreeable for CSW to send referral to financial counselor to see if patient qualifies for medicaid, referral has been made.  ? ?Patient will need HH PT orders at time of dc.  ? ? ? ?Expected Discharge Plan: Home w Home Health Services ?Barriers to Discharge: Continued Medical Work up ? ? ?Patient Goals and CMS Choice ?Patient states their goals for this hospitalization and ongoing recovery are:: to go home ?CMS Medicare.gov Compare Post Acute Care list provided to:: Patient ?Choice offered to / list presented to : Patient ? ?Expected Discharge Plan and Services ?Expected Discharge Plan: Home w Home Health Services ?  ?  ?  ?Living arrangements for the past 2 months: Single Family Home ?                ?  ?  ?  ?  ?  ?HH Arranged: PT ?HH Agency:  (charity) ?  ?  ?  ? ?Prior Living Arrangements/Services ?Living arrangements for the past 2 months: Single Family Home ?Lives with:: Self ?  ?Do you feel safe going back to the place where you live?: Yes      ?  ?  ?  ?  ? ?Activities of Daily Living ?  ?  ? ?Permission Sought/Granted ?  ?  ?   ?   ?   ?   ? ?Emotional Assessment ?   ?  ?  ?  ?  ?  ? ?Admission diagnosis:  Acute respiratory failure with hypoxia (HCC) [J96.01] ?Multifocal pneumonia [J18.9] ?Patient Active Problem List  ? Diagnosis Date Noted  ? Multifocal pneumonia 05/31/2021  ? Acute respiratory failure with hypoxia (HCC) 05/31/2021  ? COPD (chronic obstructive pulmonary disease) (HCC)   ? Elevated troponin   ? Discitis of thoracic region   ? Hepatitis C virus infection without hepatic coma   ? Citrobacter infection   ? Hepatic steatosis   ? Polysubstance abuse (HCC) 08/25/2020  ? IVDU (intravenous drug user) 08/25/2020  ? HTN (hypertension) 08/25/2020  ? Depression with anxiety 08/25/2020  ? Nausea & vomiting 08/25/2020  ? Chest pain 08/25/2020  ? Hypokalemia 08/25/2020  ? Hypomagnesemia 08/25/2020  ? Pulmonary nodule 08/25/2020  ? UTI (urinary tract infection) 08/25/2020  ? Osteomyelitis of thoracic spine (HCC) 08/25/2020  ? Cellulitis of right hand 08/25/2020  ? Bacteremia due to Gram-negative bacteria 08/25/2020  ? Smoking 02/22/2019  ? Cough 01/24/2019  ? Shortness of breath 01/24/2019  ? Sore throat 01/24/2019  ? Chest tightness 12/20/2018  ? History of hepatitis 10/26/2018  ?  Urinary tract infection symptoms 10/26/2018  ? Sleep apnea 10/26/2018  ? Back pain 10/26/2018  ? Abnormal EKG 09/22/2018  ? History of asthma 09/22/2018  ? Right hip pain 09/22/2018  ? Status post right hip replacement 09/13/2018  ? Leg edema 08/22/2018  ? Primary osteoarthritis of right hip 07/27/2018  ? Vaginal discharge 07/14/2018  ? Sepsis (HCC) 04/30/2018  ? Opioid use disorder, moderate, dependence (HCC) 05/02/2016  ? Tobacco use disorder 05/02/2016  ? Severe recurrent major depression without psychotic features (HCC) 05/01/2016  ? Cocaine use disorder, moderate, dependence (HCC) 05/01/2016  ? Alcohol abuse 05/01/2016  ? GERD (gastroesophageal reflux disease) 05/01/2016  ? ?PCP:  Rolm Gala, NP ?Pharmacy:   ?[facility] - [place], [place] - [address] ?[address] ?[address] ?[place] [address] ?Phone: [phone] Fax: [phone] ? ?[facility] ?[address], [address] ?[place] [address] ?Phone: [phone] Fax: [phone] ? ? ? ? ?Social Determinants of Health (SDOH) Interventions ?  ? ?Readmission Risk Interventions ?   ? View : No data to display.  ?  ?  ?  ? ? ? ?

## 2021-06-04 LAB — COMPREHENSIVE METABOLIC PANEL
ALT: 20 U/L (ref 0–44)
AST: 24 U/L (ref 15–41)
Albumin: 2.6 g/dL — ABNORMAL LOW (ref 3.5–5.0)
Alkaline Phosphatase: 62 U/L (ref 38–126)
Anion gap: 7 (ref 5–15)
BUN: 13 mg/dL (ref 6–20)
CO2: 28 mmol/L (ref 22–32)
Calcium: 8.3 mg/dL — ABNORMAL LOW (ref 8.9–10.3)
Chloride: 106 mmol/L (ref 98–111)
Creatinine, Ser: 0.64 mg/dL (ref 0.44–1.00)
GFR, Estimated: >60 mL/min (ref >60)
Glucose, Bld: 93 mg/dL (ref 70–99)
Potassium: 3.7 mmol/L (ref 3.5–5.1)
Sodium: 141 mmol/L (ref 135–145)
Total Bilirubin: 0.5 mg/dL (ref 0.3–1.2)
Total Protein: 6.2 g/dL — ABNORMAL LOW (ref 6.5–8.1)

## 2021-06-04 LAB — GLUCOSE, CAPILLARY: Glucose-Capillary: 93 mg/dL (ref 70–99)

## 2021-06-04 LAB — CBC
HCT: 35.1 % — ABNORMAL LOW (ref 36.0–46.0)
Hemoglobin: 10.4 g/dL — ABNORMAL LOW (ref 12.0–15.0)
MCH: 21.7 pg — ABNORMAL LOW (ref 26.0–34.0)
MCHC: 29.6 g/dL — ABNORMAL LOW (ref 30.0–36.0)
MCV: 73.1 fL — ABNORMAL LOW (ref 80.0–100.0)
Platelets: 246 10*3/uL (ref 150–400)
RBC: 4.8 MIL/uL (ref 3.87–5.11)
RDW: 20.8 % — ABNORMAL HIGH (ref 11.5–15.5)
WBC: 5.3 10*3/uL (ref 4.0–10.5)
nRBC: 0 % (ref 0.0–0.2)

## 2021-06-04 LAB — LEGIONELLA PNEUMOPHILA SEROGP 1 UR AG: L. pneumophila Serogp 1 Ur Ag: NEGATIVE

## 2021-06-04 NOTE — Progress Notes (Addendum)
?PROGRESS NOTE ? ? ?HPI was taken from Dr. Blaine Hamper: ?Brenda [name] is a 51 y.o. female with medical history significant of polysubstance abuse, IV drug user, hypertension, COPD, asthma, GERD, depression, anxiety, HCV, bipolar, alcohol abuse, tobacco abuse, dCHF, discitis of T-spine, who presents with chest pain and shortness of breath. ?  ?Patient states that she has chest pain for almost a week, which is located in front chest, 10 out of 10 in severity initially, currently mild, nonradiating, pleuritic, aggravated by deep breath.  She states she developed cough and shortness breath in the past 2 days, which has been progressively worsening.  She coughs up white-colored mucus, no fever or chills.  Patient states that she has nausea and vomited 2 days ago, which has resolved.  Currently patient does not have nausea vomiting, diarrhea or abdominal pain.  Denies symptoms of UTI.  Patient states that she has T-spine discitis last year, and still has some upper back pain. ?  ?Patient was initially hypotensive with blood pressure 85/55, which improved to 105/78 after giving 500 cc normal saline in the ED. Pt was found to have severe respiratory distress, oxygen desaturation to 77% on room air, initially started nonrebreather, then 6 L oxygen, then changed to 5 L oxygen with saturation 93% currently. ?  ?Data Reviewed and ED Course: pt was found to have WBC 14.7, troponin level 330, lactic acid 1.4, BNP 213, negative pregnancy test, urinalysis (amber appearance, moderate amount of leukocyte, rare bacteria.  WBC 21-50), negative COVID PCR, positive D-dimer 1.73, potassium 3.0, GFR> 60, temperature normal, ?  ?CTA: ?Severe bilateral airspace disease most consistent with multifocal ?pneumonia/pneumonitis, would consider atypical etiologies. Recommend ?chest CT follow-up to resolution. ?  ?New anterior compression fracture of T8 with approximately 30% ?height loss, and progressive severe disc height loss at T8-T9 with ?some  endplate irregularity and persistent paraspinal soft tissue ?thickening edema at this level. 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 Recommend thoracic spine MRI with ?and without contrast. ?  ?No evidence of pulmonary embolism. ?  ?  ?MRI-T spin: ?1. 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 ?Appearance favors indolent or acute on chronic process. ?2. No epidural fluid collection or abscess. ?3. Anterior compression fracture of T8 with approximately 30% ?vertebral body height loss. ?4. Extensive bilateral airspace consolidations, better characterized ?by CT. ?  ?MRI-L spin: ?1. No acute osseous abnormality or evidence of discitis of the ?lumbar spine. ?2. Lumbar facet arthropathy, greatest at L5-S1. No foraminal or ?canal stenosis at any level. ?  ? ?As per Dr. Jimmye Norman 5/6-06/04/21: Pt was found to have multifocal pneumonia on admission has been treated w/ IV vanco & cefepime, bronchodilators & incentive spirometry. Pt has also required supplemental oxygen but pt has been unable to wean off of it yet. Of note, pt was found to have discitis of thoracic region and has been on IV abxs. Pt has this same problem last year and was treated w/ abxs. Pt will need at least 1 day more of IV abxs. PT/OT evaluated pt and recs HH.  ? ? ? ? ANIKO FINNIGAN  YQI:[id number] DOB: [birth year] DOA: 05/31/2021 ?PCP: Langston Reusing, NP  ? ? ?Assessment & Plan: ?  ?Principal Problem: ?  Multifocal pneumonia ?Active Problems: ?  Sepsis ([place]) ?  Acute respiratory failure with hypoxia ([place]) ?  Cocaine use disorder, moderate, dependence ([place]) ?  Alcohol abuse ?  Tobacco use disorder ?  Polysubstance abuse ([place]) ?  HTN (hypertension) ?  Depression with  anxiety ?  Chest pain ?  UTI (urinary tract infection) ?  Discitis of thoracic region ?  COPD (chronic obstructive pulmonary disease) ([place]) ?  Elevated troponin ? ?Assessment and Plan: ?Multifocal pneumonia: as per CTA chest. Continue on IV cefepime,  vanco, bronchodilators & encourage incentive spirometry. Strep & legionella are both neg  ? ?Acute hypoxic respiratory failure: found to be 77% on RA. Secondary to above. Continue on supplemental oxygen and wean as tolerated  ?  ?Sepsis: met criteria w/ leukocytosis, tachycardia, tachypnea & discitis of thoracic region. Continue on IV abxs. Sepsis resolved  ? ?Hypokalemia: WNL today  ? ?COPD: w/o exacerbation. Continue on bronchodilators   ?  ?Discitis of thoracic region: MRI showed appearance favors indolent or acute on chronic process. Continue on IV vanco, cefepime. Had this problem last year & was treated w/ abxs  ? ?T8 compression fracture: continue w/ supportive care  ? ?Possible UTI: urine cx shows no growth but urine cx was collected after IV abxs were started  ?  ?Chest pain: w/ elevated troponins likely due to demand ischemia. CTA chest neg for PE. Resolved  ?  ?Depression: severity unknown. Continue on home dose of fluoxetine  ? ?HTN: continue to hold all anti-HTN meds as BP is low normal  ? ?Tobacco use disorder: received smoking cessation counseling. Nicotine patch to prevent w/drawal ? ?Alcohol abuse: continue on CIWA protocol. Received alcohol cessation counseling  ? ?Illicit drug use: urine screen positive for benzos, amphetamines. Also uses cocaine. Received illicit drug cessation counseling  ?  ?Morbid obesity: BMI 40.2. Complicates overall care & prognosis  ? ? ? ?DVT prophylaxis: heparin ?Code Status: full  ?Family Communication:  ?Disposition Plan: likely d/c back home  ? ?Level of care: Med-Surg ? ?Status is: Inpatient ?Remains inpatient appropriate because: requiring IV abxs ? ? ? ?Consultants:  ? ? ?Procedures:  ? ?Antimicrobials: cefepime, vanco  ? ? ?Subjective: ?Pt c/o malaise  ? ?Objective: ?Vitals:  ? 06/03/21 2047 06/03/21 2051 06/04/21 0100 06/04/21 0508  ?BP: 121/71  134/75 108/68  ?Pulse: 87   74  ?Resp: _0 ?Temp: (!) 97.5 ?F (36.4 ?C) 98.3 ?F (36.8 ?C)  98 ?F (36.7 ?C)   ?TempSrc: Oral Oral  Oral  ?SpO2: 98% 99%  96%  ?Weight:      ?Height:      ? ? ?Intake/Output Summary (Last 24 hours) at 06/04/2021 0736 ?Last data filed at 06/03/2021 1800 ?Gross per 24 hour  ?Intake 853.15 ml  ?Output --  ?Net 853.15 ml  ? ?Filed Weights  ? 05/31/21 0912 06/01/21 1840 06/02/21 0500  ?Weight: 104.3 kg 101.9 kg 101.5 kg  ? ? ?Examination: ? ?General exam: Appears calm & comfortable  ?Respiratory system: diminished breath sounds b/l  ?Cardiovascular system: S1 & S2+. No rubs or gallops  ?Gastrointestinal system: Abd is soft, NT, obese & hypoactive bowel sounds  ?Central nervous system: alert and oriented. Moves all extremities   ?Psychiatry: judgement and insight appears at baseline. Flat mood and affect ? ? ? ?Data Reviewed: I have personally reviewed following labs and imaging studies ? ?CBC: ?Recent Labs  ?Lab 05/31/21 ?0923 06/01/21 ?0426 06/02/21 ?0407 06/03/21 ?9169 06/04/21 ?0442  ?WBC 14.7* 11.7* 9.2 6.8 5.3  ?HGB 12.9 12.4 11.5* 11.1* 10.4*  ?HCT 42.5 42.1 39.0 37.0 35.1*  ?MCV 70.6* 73.0* 72.0* 71.7* 73.1*  ?PLT 267 291 258 272 246  ? ?Basic Metabolic Panel: ?Recent Labs  ?Lab 05/31/21 ?0923 05/31/21 ?1520 06/01/21 ?0426  06/02/21 ?0407 06/03/21 ?4235 06/04/21 ?0442  ?NA 135  --  137 139 136 141  ?K 3.0*  --  4.8 4.7 4.0 3.7  ?CL 103  --  107 107 105 106  ?CO2 21*  --  _0 ?GLUCOSE 135*  --  89 84 93 93  ?BUN 11  --  _1 ?CREATININE 0.90  --  0.72 0.68 0.68 0.64  ?CALCIUM 8.6*  --  8.5* 8.5* 8.1* 8.3*  ?MG  --  1.9  --   --   --   --   ? ?GFR: ?Estimated Creatinine Clearance: 95.9 mL/min (by C-G formula based on SCr of 0.64 mg/dL). ?Liver Function Tests: ?Recent Labs  ?Lab 05/31/21 ?0923 06/02/21 ?0407 06/03/21 ?3614 06/04/21 ?0442  ?AST 44* _2 ?ALT 34 _3 ?ALKPHOS 113 89 73 62  ?BILITOT 0.5 0.7 0.7 0.5  ?PROT 7.3 6.9 6.6 6.2*  ?ALBUMIN 3.3* 3.0* 2.7* 2.6*  ? ?No results for input(s): LIPASE, AMYLASE in the last 168 hours. ?No results for input(s): AMMONIA in the  last 168 hours. ?Coagulation Profile: ?No results for input(s): INR, PROTIME in the last 168 hours. ?Cardiac Enzymes: ?No results for input(s): CKTOTAL, CKMB, CKMBINDEX, TROPONINI in the last 168 hours. ?BNP

## 2021-06-04 NOTE — Plan of Care (Signed)
?  Problem: Education: ?Goal: Knowledge of disease or condition will improve ?Outcome: Progressing ?Goal: Knowledge of the prescribed therapeutic regimen will improve ?Outcome: Progressing ?Goal: Individualized Educational Video(s) ?Outcome: Progressing ?  ?Problem: Activity: ?Goal: Ability to tolerate increased activity will improve ?Outcome: Progressing ?Goal: Will verbalize the importance of balancing activity with adequate rest periods ?Outcome: Progressing ?  ?Problem: Respiratory: ?Goal: Ability to maintain a clear airway will improve ?Outcome: Progressing ?Goal: Levels of oxygenation will improve ?Outcome: Progressing ?Goal: Ability to maintain adequate ventilation will improve ?Outcome: Progressing ?  ?Problem: Activity: ?Goal: Ability to tolerate increased activity will improve ?Outcome: Progressing ?  ?Problem: Clinical Measurements: ?Goal: Ability to maintain a body temperature in the normal range will improve ?Outcome: Progressing ?  ?Problem: Respiratory: ?Goal: Ability to maintain adequate ventilation will improve ?Outcome: Progressing ?Goal: Ability to maintain a clear airway will improve ?Outcome: Progressing ?  ?Problem: Education: ?Goal: Knowledge of General Education information will improve ?Description: Including pain rating scale, medication(s)/side effects and non-pharmacologic comfort measures ?Outcome: Progressing ?  ?Problem: Health Behavior/Discharge Planning: ?Goal: Ability to manage health-related needs will improve ?Outcome: Progressing ?  ?Problem: Clinical Measurements: ?Goal: Ability to maintain clinical measurements within normal limits will improve ?Outcome: Progressing ?Goal: Will remain free from infection ?Outcome: Progressing ?Goal: Diagnostic test results will improve ?Outcome: Progressing ?Goal: Respiratory complications will improve ?Outcome: Progressing ?Goal: Cardiovascular complication will be avoided ?Outcome: Progressing ?  ?Problem: Activity: ?Goal: Risk for activity  intolerance will decrease ?Outcome: Progressing ?  ?Problem: Nutrition: ?Goal: Adequate nutrition will be maintained ?Outcome: Progressing ?  ?Problem: Coping: ?Goal: Level of anxiety will decrease ?Outcome: Progressing ?  ?Problem: Pain Managment: ?Goal: General experience of comfort will improve ?Outcome: Progressing ?  ?Problem: Safety: ?Goal: Ability to remain free from injury will improve ?Outcome: Progressing ?  ?Problem: Skin Integrity: ?Goal: Risk for impaired skin integrity will decrease ?Outcome: Progressing ?  ?

## 2021-06-04 NOTE — Progress Notes (Signed)
Attempted to wean patient to Room Air. Patient could not maitain SpO2 above 90%.  SpO2 dropped to 82 percent. ? ?Patient on 1L of O2 via [place]. ?

## 2021-06-05 ENCOUNTER — Other Ambulatory Visit: Payer: Self-pay

## 2021-06-05 DIAGNOSIS — N3001 Acute cystitis with hematuria: Secondary | ICD-10-CM

## 2021-06-05 LAB — CBC
HCT: 36.4 % (ref 36.0–46.0)
Hemoglobin: 10.7 g/dL — ABNORMAL LOW (ref 12.0–15.0)
MCH: 21.4 pg — ABNORMAL LOW (ref 26.0–34.0)
MCHC: 29.4 g/dL — ABNORMAL LOW (ref 30.0–36.0)
MCV: 72.9 fL — ABNORMAL LOW (ref 80.0–100.0)
Platelets: 271 10*3/uL (ref 150–400)
RBC: 4.99 MIL/uL (ref 3.87–5.11)
RDW: 20.8 % — ABNORMAL HIGH (ref 11.5–15.5)
WBC: 5.6 10*3/uL (ref 4.0–10.5)
nRBC: 0 % (ref 0.0–0.2)

## 2021-06-05 LAB — COMPREHENSIVE METABOLIC PANEL
ALT: 26 U/L (ref 0–44)
AST: 34 U/L (ref 15–41)
Albumin: 2.8 g/dL — ABNORMAL LOW (ref 3.5–5.0)
Alkaline Phosphatase: 70 U/L (ref 38–126)
Anion gap: 3 — ABNORMAL LOW (ref 5–15)
BUN: 12 mg/dL (ref 6–20)
CO2: 28 mmol/L (ref 22–32)
Calcium: 8.5 mg/dL — ABNORMAL LOW (ref 8.9–10.3)
Chloride: 108 mmol/L (ref 98–111)
Creatinine, Ser: 0.53 mg/dL (ref 0.44–1.00)
GFR, Estimated: >60 mL/min (ref >60)
Glucose, Bld: 84 mg/dL (ref 70–99)
Potassium: 3.6 mmol/L (ref 3.5–5.1)
Sodium: 139 mmol/L (ref 135–145)
Total Bilirubin: 0.7 mg/dL (ref 0.3–1.2)
Total Protein: 6.5 g/dL (ref 6.5–8.1)

## 2021-06-05 LAB — CULTURE, BLOOD (ROUTINE X 2)
Culture: NO GROWTH
Culture: NO GROWTH
Special Requests: ADEQUATE
Special Requests: ADEQUATE

## 2021-06-05 LAB — GLUCOSE, CAPILLARY: Glucose-Capillary: 82 mg/dL (ref 70–99)

## 2021-06-05 MED ORDER — ATORVASTATIN CALCIUM 40 MG PO TABS: 40.0000 mg | ORAL_TABLET | Freq: Every day | ORAL | 0 refills | Status: DC

## 2021-06-05 MED ORDER — ASPIRIN 81 MG PO TBEC: 81.0000 mg | DELAYED_RELEASE_TABLET | Freq: Every day | ORAL | 0 refills | Status: DC

## 2021-06-05 MED ORDER — ATORVASTATIN CALCIUM 40 MG PO TABS
40.0000 mg | ORAL_TABLET | Freq: Every day | ORAL | 0 refills | Status: DC
  Filled 2021-06-05: qty 30, 30d supply, fill #0

## 2021-06-05 MED ORDER — ASPIRIN 81 MG PO TBEC
81.0000 mg | DELAYED_RELEASE_TABLET | Freq: Every day | ORAL | 0 refills | Status: DC
  Filled 2021-06-05: qty 30, 30d supply, fill #0

## 2021-06-05 NOTE — Progress Notes (Addendum)
Pre-rounds chart review and preliminary plan / goals for today: ?Significant overnight events per chart: none ?Significant new findings on labs/other diagnostics:  ?No concerns on AM CBC/CMP today  ?Dispo plan:  ?per yesterday's note, plan to d/c home w/ HH after receiving 1 more day (today) IV abx however... ?today RN notes concern for opiate withdrawal, pt is asking about why was benzo d/c "for fentanyl withdrawal" (she was on benzo for EtOH withdrawal and is now not meeting CIWA admin criteria), no opiates on UDS this admission however (+)amphetamine and benzodiazepine ?Pending/Plan:  ?will d/w TOC today re: HH, will assess on rounds ?hopefully will be ready for d/c soon possibly later today or tomorrow however may need to treat withdrawals / arrange outpatient opiate treatment  ? ?Please feel free to reach out via secure chat in Epic for non-urgent issues. Please page for urgent matters! ? ?This note will be updated after rounds to full progress note / discharge note.  ? ?

## 2021-06-05 NOTE — Discharge Summary (Signed)
Physician Discharge Summary  ?Brenda [name] DXA:[id number] DOB: 03-17-1970 DOA: 05/31/2021 ? ?PCP: Rolm Gala, NP ? ?Admit date: 05/31/2021 ?Discharge date: 06/05/2021 ? ?Admitted From: home ?Disposition:  home ? ?Recommendations for Outpatient Follow-up:  ?Follow up with PCP in 1-2 weeks:  ?consider repeat CT chest to evaluate resolution of pneumonia ?Discuss chronic back pain w/ patient and polysubstance abuse - UDS (+) amphetamines, benzodiazepines. Patient admits to self-medication with fentanyl intermittently, she was advised to NEVER take prescriptions that are not hers, she as advised DO NOT engage in recreational drug use  ?Please follow up on the following pending results: none ? ?Home Health: ordered  ?Equipment/Devices: none ? ?Discharge Condition: good  ?CODE STATUS: FULL  ?Diet recommendation:  ?Diet Orders (From admission, onward)  ? ?  Start     Ordered  ? 06/05/21 0000  Diet - low sodium heart healthy       ? 06/05/21 1353  ? 06/01/21 0826  Diet regular Room service appropriate? Yes; Fluid consistency: Thin  Diet effective now       ?Question Answer Comment  ?Room service appropriate? Yes   ?Fluid consistency: Thin   ?  ? 06/01/21 0825  ? ?  ?  ? ?  ? ? ? ?Brief/Interim Summary: ? ?Brenda [name] is a 51 y.o. female with medical history significant of polysubstance abuse, IV drug user, hypertension, COPD, asthma, GERD, depression, anxiety, HCV, bipolar, alcohol abuse, tobacco abuse, dCHF, discitis of T-spine, who presents with chest pain and shortness of breath. ?  ?Patient was initially hypotensive with blood pressure 85/55, which improved to 105/78 after giving 500 cc normal saline in the ED. Pt was found to have severe respiratory distress, oxygen desaturation to 77% on room air, initially started nonrebreather, then 6 L oxygen, then changed to 5 L oxygen with saturation 93% . Data Reviewed and ED Course: pt was found to have WBC 14.7, troponin level 330, lactic acid 1.4, BNP 213,  negative pregnancy test, urinalysis (amber appearance, moderate amount of leukocyte, rare bacteria.  WBC 21-50), negative COVID PCR, positive D-dimer 1.73, potassium 3.0, GFR> 60, temperature normal. NO PE on CTA but (+)pneumonia, was treated as below. MTI T-spine (+)likely chronic discitis T8-9. 5/6-06/04/21: treated pneumonia w/ IV vanco & cefepime, bronchodilators & incentive spirometry. Weaned off O2 and pain meds ? ?Consultants:  ?none ? ?Procedures:  ?none ? ? ? ? ? ?Discharge Diagnoses: ?Principal Problem: ?  Multifocal pneumonia ?Active Problems: ?  Cocaine use disorder, moderate, dependence (HCC) ?  Alcohol abuse ?  Tobacco use disorder ?  Sepsis (HCC) ?  Polysubstance abuse (HCC) ?  HTN (hypertension) ?  Depression with anxiety ?  Chest pain ?  UTI (urinary tract infection) ?  Discitis of thoracic region ?  Acute respiratory failure with hypoxia (HCC) ?  COPD (chronic obstructive pulmonary disease) (HCC) ?  Elevated troponin ? ? ? ?Assessment & Plan: ?Multifocal pneumonia ?Admitted w/ Acute respiratory failure with hypoxia and sepsis due to multifocal pneumonia: CT angiograms negative for PE.  Patient has sepsis with WBC 14.7, heart rate 121, RR 30.  Lactic acid normal 1.4. Sepsis RESOLVED and 5 days broad spectrum antibiotics completed at time of discharge, off oxygen at time of discharge, no concerns on lung exam. TO follow outpatient to resolution, consider repeat CT or CXR.  ? ? ?Acute respiratory failure with hypoxia (HCC) ?See above ? ?Cocaine use disorder, moderate, dependence (HCC) ?counseling about importance of quitting substance abuse ? ?Alcohol abuse ?-  CIWA protocol while inpatient ? ?Tobacco use disorder ?- Nicotine patch ? ?Sepsis (HCC) ?See above ? ?Polysubstance abuse (HCC) ?- Did counseling about importance of quitting substance abuse ? ?HTN (hypertension) ?- Hold blood pressure medications since blood pressure is soft ? ?Depression with anxiety ?- Continue home medications ? ?Chest  pain ?Chest pain and elevated troponin: Troponin level 330, 243, 252.  Her chest pain is a pleuritic, CT angiogram negative for PE.  Elevated troponin was likely due to demand ischemia. ?-Start aspirin and Lipitor ?-Trend troponin down ? ?UTI (urinary tract infection) ?UCx no growth and symptoms resolved  ? ?Discitis of thoracic region ?Pt had this issue last year and was treated with abx. MRI showed appearance favors indolent or acute on chronic process. ?-On vancomycin and cefepime ? ? ?COPD (chronic obstructive pulmonary disease) (HCC) ?- Bronchodilators ? ?Elevated troponin ?See assessment and plan the chest pain ? ? ? ? ? ?Discharge Instructions ? ?Discharge Instructions   ? ? Diet - low sodium heart healthy   Complete by: As directed ?  ? Discharge instructions   Complete by: As directed ?  ? Please follow up with your PCP to discuss pain management option. DO NOT use prescription drugs that are not your prescriptions. DO NOT use recreational drugs of any kind.  ? Increase activity slowly   Complete by: As directed ?  ? ?  ? ?Allergies as of 06/05/2021   ? ?   Reactions  ? Flexeril [cyclobenzaprine] Other (See Comments)  ? RLS-like symptoms   ? Quetiapine Fumarate Other (See Comments)  ? RLS-like symptoms only at doses greater than 100mg  per patient.  ? ?  ? ?  ?Medication List  ?  ? ?STOP taking these medications   ? ?ciprofloxacin 500 MG tablet ?Commonly known as: Cipro ?  ? ?  ? ?TAKE these medications   ? ?aspirin 81 MG EC tablet ?Take 1 tablet (81 mg total) by mouth once daily. Swallow whole. ?Start taking on: Jun 06, 2021 ?  ?atorvastatin 40 MG tablet ?Commonly known as: LIPITOR ?Take 1 tablet (40 mg total) by mouth once daily. ?Start taking on: Jun 06, 2021 ?  ?esomeprazole 40 MG capsule ?Commonly known as: NexIUM ?Take 1 capsule by mouth once a day ?  ?FLUoxetine 20 MG capsule ?Commonly known as: PROzac ?Take 1 capsule (20 mg total) by mouth once daily. ?  ?folic acid 1 MG tablet ?Commonly known as:  FOLVITE ?Take 1 tablet (1 mg total) by mouth once daily. ?  ?ibuprofen 800 MG tablet ?Commonly known as: ADVIL ?TAKE 1 TABLET BY MOUTH ONCE EVERY EIGHT HOURS WITH FOOD AS NEEDED FOR HAND PAIN. ?  ?methocarbamol 750 MG tablet ?Commonly known as: ROBAXIN ?TAKE 1 TABLET BY MOUTH ONCE EVERY 12 HOURS AS NEEDED FOR MUSCLE SPASM. ?  ?ProAir HFA 108 (90 Base) MCG/ACT inhaler ?Generic drug: albuterol ?Inhale 2 puffs into the lungs once every 6 (six) hours as needed for wheezing. ?  ?QUEtiapine 50 MG tablet ?Commonly known as: SEROQUEL ?Take 1 tablet (50 mg total) by mouth once daily. ?  ?Spiriva Respimat 1.25 MCG/ACT Aers ?Generic drug: Tiotropium Bromide Monohydrate ?INHALE 1 PUFF INTO THE LUNGS ONCE DAILY. ?  ?thiamine 100 MG tablet ?Commonly known as: VITAMIN B-1 ?Take 1 tablet (100 mg total) by mouth once daily. ?  ? ?  ? ?Diet Orders (From admission, onward)  ? ?  Start     Ordered  ? 06/05/21 0000  Diet - low sodium heart  healthy       ? 06/05/21 1353  ? 06/01/21 0826  Diet regular Room service appropriate? Yes; Fluid consistency: Thin  Diet effective now       ?Question Answer Comment  ?Room service appropriate? Yes   ?Fluid consistency: Thin   ?  ? 06/01/21 0825  ? ?  ?  ? ?  ? ? ? ? ?Allergies  ?Allergen Reactions  ? Flexeril [Cyclobenzaprine] Other (See Comments)  ?  RLS-like symptoms   ? Quetiapine Fumarate Other (See Comments)  ?  RLS-like symptoms only at doses greater than 100mg  per patient.  ? ? ? ?Subjective: Patient seen and examined this morning, NAD< resting comfortably in bed, complaining of needing ativan for fentanyl withdrawal reporting feeling"shaky" - admits to regular fentanyl use few times per week or so at home to help her chronic back pain. Denies other drug use, see UDS results.  ? ? ?Discharge Exam: ?Vitals:  ? 06/05/21 0424 06/05/21 0758  ?BP: 119/64 120/64  ?Pulse: 65 66  ?Resp: 18 15  ?Temp: (!) 97.5 ?F (36.4 ?C) 97.7 ?F (36.5 ?C)  ?SpO2: 99% 100%  ? ?Vitals:  ? 06/04/21 1555 06/04/21 1944  06/05/21 0424 06/05/21 0758  ?BP: 138/80 105/60 119/64 120/64  ?Pulse: 93 69 65 66  ?Resp: 20 18 18 15   ?Temp: 98 ?F (36.7 ?C) (!) 97.5 ?F (36.4 ?C) (!) 97.5 ?F (36.4 ?C) 97.7 ?F (36.5 ?C)  ?TempSrc: Oral Oral Oral

## 2021-06-05 NOTE — TOC Progression Note (Signed)
Transition of Care (TOC) - Progression Note  ? ? ?Patient Details  ?Name: Brenda [name] ?MRN: [id number] ?Date of Birth: 1970/09/12 ? ?Transition of Care (TOC) CM/SW Contact  ?Margarito Liner, LCSW ?Phone Number: ?06/05/2021, 11:13 AM ? ?Clinical Narrative: [facility] is reviewing charity home health referral for PT, OT, RN.   ? ?Expected Discharge Plan: Home w Home Health Services ?Barriers to Discharge: Continued Medical Work up ? ?Expected Discharge Plan and Services ?Expected Discharge Plan: Home w Home Health Services ?  ?  ?  ?Living arrangements for the past 2 months: Single Family Home ?                ?  ?  ?  ?  ?  ?HH Arranged: PT ?HH Agency:  (charity) ?  ?  ?  ? ? ?Social Determinants of Health (SDOH) Interventions ?  ? ?Readmission Risk Interventions ?   ? View : No data to display.  ?  ?  ?  ? ? ?

## 2021-06-05 NOTE — Consult Note (Signed)
Pharmacy Antibiotic Note ? ?Brenda [name] is [name] 51 y.o. female w/ h/o IVDU admitted on 05/31/2021 with sepsis/multifocal pneumonia,T8/T9 vertebral discitis.  Pharmacy has been consulted for Vancomycin and Cefepime dosing. ? ? ?Plan: ?Day 6 of antibiotics.  ? ?Vancomycin 1500mg  q24h ?Goal AUC 400-600  ?Est AUC: 480.4; Cmax: 38.3; Cmin: 9.6 ?SCr 0.8(actual 0.68); Vd 0.5 (BMI > 30) ? ?Cefepime 2g IV Q8H ? ? ?Height: 5' 2.5" (158.8 cm) ?Weight: 101.5 kg (223 lb 12.3 oz) ?IBW/kg (Calculated) : 51.25 ? ?Temp (24hrs), Avg:97.7 ?F (36.5 ?C), Min:97.5 ?F (36.4 ?C), Max:98 ?F (36.7 ?C) ? ?Recent Labs  ?Lab 05/31/21 ?1018 05/31/21 ?1520 06/01/21 ?0426 06/02/21 ?0407 06/03/21 ?08/03/21 06/04/21 ?08/04/21 06/05/21 ?08/05/21  ?WBC  --   --  11.7* 9.2 6.8 5.3 5.6  ?CREATININE  --   --  0.72 0.68 0.68 0.64 0.53  ?LATICACIDVEN 1.4 1.2  --   --   --   --   --   ? ?  ?Estimated Creatinine Clearance: 95.9 mL/min (by C-G formula based on SCr of 0.53 mg/dL).   ? ?Allergies  ?Allergen Reactions  ? Flexeril [Cyclobenzaprine] Other (See Comments)  ?  RLS-like symptoms   ? Quetiapine Fumarate Other (See Comments)  ?  RLS-like symptoms only at doses greater than 100mg  per patient.  ? ? ?Antimicrobials this admission: ?VAN/CFp (5/05 >>  ? ?Dose adjustments this admission: ?5/8: Vanc adjusted from 1g Q12h to 1500mg  Q24h ? ?Microbiology results: ?5/05 BCx: NGTD ?5/05 Resp cx: few GPC, rare GNR ?5/05 Cov/Flu: negative  ?5/05 MRSA PCR: negative ? ?Thank you for allowing pharmacy to be [name] part of this patient?s care. ? ?Brenda [name] ?06/05/2021 8:48 AM ? ?

## 2021-06-05 NOTE — TOC Transition Note (Signed)
Transition of Care (TOC) - CM/SW Discharge Note ? ? ?Patient Details  ?Name: Brenda [name] ?MRN: [id number] ?Date of Birth: May 09, 1970 ? ?Transition of Care (TOC) CM/SW Contact:  ?Candie Chroman, LCSW ?Phone Number: ?06/05/2021, 2:58 PM ? ? ?Clinical Narrative:  Patient has orders to discharge home today. [facility] is unable to accept referral and was unable to find another agency to accept. Patient is aware. Asked about getting Medicaid. She is in the process of getting disability. Sent email to Development worker, community. No further concerns. CSW signing off. ? ?Final next level of care: Home/Self Care ?Barriers to Discharge: Barriers Resolved ? ? ?Patient Goals and CMS Choice ?Patient states their goals for this hospitalization and ongoing recovery are:: to go home ?CMS Medicare.gov Compare Post Acute Care list provided to:: Patient ?Choice offered to / list presented to : Patient ? ?Discharge Placement ?  ?           ?  ?  ?  ?Patient and family notified of of transfer: 06/05/21 ? ?Discharge Plan and Services ?  ?  ?           ?  ?  ?  ?  ?  ?HH Arranged: PT ?[facility] Agency:  (charity) ?  ?  ?  ? ?Social Determinants of Health (SDOH) Interventions ?  ? ? ?Readmission Risk Interventions ?   ? View : No data to display.  ?  ?  ?  ? ? ? ? ? ?

## 2021-06-05 NOTE — Progress Notes (Addendum)
AVS reviewed with pt; all questions answered. Paper scrubs provided and PIV removed; pt in NAD. Ride called. ?1528: pt escorted via w/c to POV ?

## 2021-06-07 ENCOUNTER — Other Ambulatory Visit: Payer: Self-pay

## 2021-06-07 MED ORDER — METHOCARBAMOL 750 MG PO TABS
750.0000 mg | ORAL_TABLET | Freq: Two times a day (BID) | ORAL | 1 refills | Status: DC | PRN
  Filled 2021-06-07: qty 90, fill #0
  Filled 2021-06-12: qty 60, 30d supply, fill #0

## 2021-06-12 ENCOUNTER — Other Ambulatory Visit: Payer: Self-pay

## 2021-07-08 ENCOUNTER — Other Ambulatory Visit: Payer: Self-pay

## 2021-07-09 ENCOUNTER — Other Ambulatory Visit: Payer: Self-pay

## 2021-07-10 ENCOUNTER — Other Ambulatory Visit: Payer: Self-pay

## 2021-07-10 MED ORDER — FLUOXETINE HCL 20 MG PO CAPS
20.0000 mg | ORAL_CAPSULE | Freq: Every day | ORAL | 1 refills | Status: AC
  Filled 2021-07-10: qty 90, 90d supply, fill #0

## 2021-07-10 MED ORDER — ATORVASTATIN CALCIUM 40 MG PO TABS
40.0000 mg | ORAL_TABLET | Freq: Every day | ORAL | 3 refills | Status: AC
  Filled 2021-07-10: qty 30, 30d supply, fill #0

## 2021-07-10 MED ORDER — METHOCARBAMOL 750 MG PO TABS
750.0000 mg | ORAL_TABLET | Freq: Two times a day (BID) | ORAL | 1 refills | Status: DC | PRN
  Filled 2021-07-10: qty 90, 45d supply, fill #0

## 2021-07-10 MED ORDER — QUETIAPINE FUMARATE 50 MG PO TABS
50.0000 mg | ORAL_TABLET | Freq: Every day | ORAL | 1 refills | Status: AC
  Filled 2021-07-10: qty 90, 90d supply, fill #0

## 2021-07-10 MED ORDER — ASPIRIN 81 MG PO TBEC
81.0000 mg | DELAYED_RELEASE_TABLET | Freq: Every day | ORAL | 4 refills | Status: AC
  Filled 2021-07-10: qty 30, 30d supply, fill #0

## 2021-07-11 ENCOUNTER — Telehealth: Payer: Self-pay | Admitting: Emergency Medicine

## 2021-07-11 ENCOUNTER — Other Ambulatory Visit: Payer: Self-pay

## 2021-07-11 NOTE — Telephone Encounter (Signed)
Called patient to schedule appointment. Patient states she was turned down as a patient the last time she applied, due to her ex-husband not completing his part of paper work. She states that he does not live there anymore. I advised to fill out a new application and if approved then we could make her an appointment. Patient agreed and voiced understanding. She will try to come by tonight to pick up application.

## 2021-07-11 NOTE — Telephone Encounter (Signed)
-----   Message from Rolm Gala, NP sent at 06/06/2021  3:35 PM EDT ----- Pls schedule an in person appointment for Ms Baysinger and make telephone note. Thank you

## 2021-10-16 ENCOUNTER — Other Ambulatory Visit: Payer: Self-pay

## 2021-12-26 ENCOUNTER — Ambulatory Visit: Payer: Self-pay

## 2021-12-26 NOTE — Telephone Encounter (Signed)
Summary: Possible ear infection   Pt believes she has an ear infection in her right ear, throbbing/pain (not severe). Pt scheduled new patient appt for may with [facility] Best contact: [phone]      Call cannot be completed at this time.

## 2021-12-26 NOTE — Telephone Encounter (Signed)
Summary: Possible ear infection   Pt believes she has an ear infection in her right ear, throbbing/pain (not severe). Pt scheduled new patient appt for may with [facility] Best contact: [phone]        Called cannot be completed at this time.

## 2021-12-26 NOTE — Telephone Encounter (Signed)
  Chief Complaint: Ear pain, sore throat, runny nose Symptoms: above Frequency: 2 days Pertinent Negatives: Patient denies fever Disposition: [] ED /[x] Urgent Care (no appt availability in office) / [] Appointment(In office/virtual)/ []  [place] [facility]/ [] Home Care/ [] Refused Recommended Disposition /[]  Mobile Bus/ []  Follow-up with PCP Additional Notes: Pt has had an URI for a few days. She has a runny nose and sore throat. Pt will go to UC.  .   Summary: Possible ear infection   Pt believes she has an ear infection in her right ear, throbbing/pain (not severe). Pt scheduled new patient appt for may with [facility] Best contact: [phone]      Answer Assessment - Initial Assessment Questions 1. LOCATION: "Which ear is involved?"     Inner - right ear 2. ONSET: "When did the ear start hurting"      2 days ago 3. SEVERITY: "How bad is the pain?"  (Scale 1-10; mild, moderate or severe)   - MILD (1-3): doesn't interfere with normal activities    - MODERATE (4-7): interferes with normal activities or awakens from sleep    - SEVERE (8-10): excruciating pain, unable to do any normal activities       4. URI SYMPTOMS: "Do you have a runny nose or cough?"     Runny nose, sore throat 5. FEVER: "Do you have a fever?" If Yes, ask: "What is your temperature, how was it measured, and when did it start?"     no 6. CAUSE: "Have you been swimming recently?", "How often do you use Q-TIPS?", "Have you had any recent air travel or scuba diving?"  7. OTHER SYMPTOMS: "Do you have any other symptoms?" (e.g., headache, stiff neck, dizziness, vomiting, runny nose, decreased hearing)     Runny nose 8. PREGNANCY: "Is there any chance you are pregnant?" "When was your last menstrual period?"  Protocols used: 

## 2022-01-16 DIAGNOSIS — R32 Unspecified urinary incontinence: Secondary | ICD-10-CM | POA: Diagnosis not present

## 2022-01-16 DIAGNOSIS — I10 Essential (primary) hypertension: Secondary | ICD-10-CM | POA: Diagnosis not present

## 2022-02-05 DIAGNOSIS — I10 Essential (primary) hypertension: Secondary | ICD-10-CM | POA: Diagnosis not present

## 2022-02-05 DIAGNOSIS — R32 Unspecified urinary incontinence: Secondary | ICD-10-CM | POA: Diagnosis not present

## 2022-03-03 DIAGNOSIS — R32 Unspecified urinary incontinence: Secondary | ICD-10-CM | POA: Diagnosis not present

## 2022-03-03 DIAGNOSIS — I10 Essential (primary) hypertension: Secondary | ICD-10-CM | POA: Diagnosis not present

## 2022-06-25 ENCOUNTER — Ambulatory Visit: Payer: Medicare PPO | Admitting: Family Medicine

## 2022-06-26 ENCOUNTER — Other Ambulatory Visit: Payer: Self-pay

## 2022-10-26 ENCOUNTER — Other Ambulatory Visit: Payer: Self-pay

## 2022-10-26 ENCOUNTER — Inpatient Hospital Stay: Payer: Medicare HMO

## 2022-10-26 ENCOUNTER — Inpatient Hospital Stay
Admission: EM | Admit: 2022-10-26 | Discharge: 2022-11-01 | DRG: 871 | Disposition: A | Discharge status: Home / Self Care | Payer: Medicare HMO | Attending: Internal Medicine | Admitting: Internal Medicine

## 2022-10-26 ENCOUNTER — Emergency Department: Payer: Medicare HMO

## 2022-10-26 DIAGNOSIS — Z79899 Other long term (current) drug therapy: Secondary | ICD-10-CM | POA: Diagnosis not present

## 2022-10-26 DIAGNOSIS — Z6841 Body Mass Index (BMI) 40.0 and over, adult: Secondary | ICD-10-CM

## 2022-10-26 DIAGNOSIS — Z716 Tobacco abuse counseling: Secondary | ICD-10-CM

## 2022-10-26 DIAGNOSIS — R6520 Severe sepsis without septic shock: Secondary | ICD-10-CM | POA: Diagnosis present

## 2022-10-26 DIAGNOSIS — B182 Chronic viral hepatitis C: Secondary | ICD-10-CM | POA: Diagnosis present

## 2022-10-26 DIAGNOSIS — Z713 Dietary counseling and surveillance: Secondary | ICD-10-CM

## 2022-10-26 DIAGNOSIS — Z8701 Personal history of pneumonia (recurrent): Secondary | ICD-10-CM

## 2022-10-26 DIAGNOSIS — R0602 Shortness of breath: Secondary | ICD-10-CM | POA: Diagnosis not present

## 2022-10-26 DIAGNOSIS — I110 Hypertensive heart disease with heart failure: Secondary | ICD-10-CM | POA: Diagnosis present

## 2022-10-26 DIAGNOSIS — J440 Chronic obstructive pulmonary disease with acute lower respiratory infection: Secondary | ICD-10-CM | POA: Diagnosis present

## 2022-10-26 DIAGNOSIS — K219 Gastro-esophageal reflux disease without esophagitis: Secondary | ICD-10-CM | POA: Diagnosis present

## 2022-10-26 DIAGNOSIS — F17210 Nicotine dependence, cigarettes, uncomplicated: Secondary | ICD-10-CM | POA: Diagnosis present

## 2022-10-26 DIAGNOSIS — H9193 Unspecified hearing loss, bilateral: Secondary | ICD-10-CM | POA: Diagnosis present

## 2022-10-26 DIAGNOSIS — J189 Pneumonia, unspecified organism: Secondary | ICD-10-CM | POA: Diagnosis present

## 2022-10-26 DIAGNOSIS — Z23 Encounter for immunization: Secondary | ICD-10-CM | POA: Diagnosis not present

## 2022-10-26 DIAGNOSIS — Z96641 Presence of right artificial hip joint: Secondary | ICD-10-CM | POA: Diagnosis present

## 2022-10-26 DIAGNOSIS — F319 Bipolar disorder, unspecified: Secondary | ICD-10-CM | POA: Diagnosis present

## 2022-10-26 DIAGNOSIS — J449 Chronic obstructive pulmonary disease, unspecified: Secondary | ICD-10-CM | POA: Diagnosis present

## 2022-10-26 DIAGNOSIS — Z888 Allergy status to other drugs, medicaments and biological substances status: Secondary | ICD-10-CM | POA: Diagnosis not present

## 2022-10-26 DIAGNOSIS — Z1152 Encounter for screening for COVID-19: Secondary | ICD-10-CM

## 2022-10-26 DIAGNOSIS — Z7982 Long term (current) use of aspirin: Secondary | ICD-10-CM

## 2022-10-26 DIAGNOSIS — G4730 Sleep apnea, unspecified: Secondary | ICD-10-CM | POA: Diagnosis present

## 2022-10-26 DIAGNOSIS — E876 Hypokalemia: Secondary | ICD-10-CM | POA: Diagnosis present

## 2022-10-26 DIAGNOSIS — I5032 Chronic diastolic (congestive) heart failure: Secondary | ICD-10-CM | POA: Diagnosis present

## 2022-10-26 DIAGNOSIS — G4733 Obstructive sleep apnea (adult) (pediatric): Secondary | ICD-10-CM | POA: Diagnosis present

## 2022-10-26 DIAGNOSIS — F1010 Alcohol abuse, uncomplicated: Secondary | ICD-10-CM | POA: Diagnosis present

## 2022-10-26 DIAGNOSIS — I10 Essential (primary) hypertension: Secondary | ICD-10-CM | POA: Diagnosis present

## 2022-10-26 DIAGNOSIS — R079 Chest pain, unspecified: Secondary | ICD-10-CM | POA: Diagnosis present

## 2022-10-26 DIAGNOSIS — F419 Anxiety disorder, unspecified: Secondary | ICD-10-CM | POA: Diagnosis present

## 2022-10-26 DIAGNOSIS — F17200 Nicotine dependence, unspecified, uncomplicated: Secondary | ICD-10-CM | POA: Diagnosis present

## 2022-10-26 DIAGNOSIS — J9601 Acute respiratory failure with hypoxia: Secondary | ICD-10-CM | POA: Diagnosis present

## 2022-10-26 DIAGNOSIS — F1420 Cocaine dependence, uncomplicated: Secondary | ICD-10-CM | POA: Diagnosis present

## 2022-10-26 DIAGNOSIS — E669 Obesity, unspecified: Secondary | ICD-10-CM

## 2022-10-26 DIAGNOSIS — B1920 Unspecified viral hepatitis C without hepatic coma: Secondary | ICD-10-CM | POA: Diagnosis present

## 2022-10-26 DIAGNOSIS — F1120 Opioid dependence, uncomplicated: Secondary | ICD-10-CM | POA: Diagnosis present

## 2022-10-26 DIAGNOSIS — A419 Sepsis, unspecified organism: Principal | ICD-10-CM | POA: Diagnosis present

## 2022-10-26 DIAGNOSIS — F1990 Other psychoactive substance use, unspecified, uncomplicated: Secondary | ICD-10-CM | POA: Diagnosis present

## 2022-10-26 LAB — HEPATIC FUNCTION PANEL
ALT: 20 U/L (ref 0–44)
AST: 30 U/L (ref 15–41)
Albumin: 3.1 g/dL — ABNORMAL LOW (ref 3.5–5.0)
Alkaline Phosphatase: 127 U/L — ABNORMAL HIGH (ref 38–126)
Bilirubin, Direct: 0.3 mg/dL — ABNORMAL HIGH (ref 0.0–0.2)
Indirect Bilirubin: 0.5 mg/dL (ref 0.3–0.9)
Total Bilirubin: 0.8 mg/dL (ref 0.3–1.2)
Total Protein: 7.8 g/dL (ref 6.5–8.1)

## 2022-10-26 LAB — BASIC METABOLIC PANEL
Anion gap: 10 (ref 5–15)
BUN: 7 mg/dL (ref 6–20)
CO2: 27 mmol/L (ref 22–32)
Calcium: 8.5 mg/dL — ABNORMAL LOW (ref 8.9–10.3)
Chloride: 99 mmol/L (ref 98–111)
Creatinine, Ser: 0.69 mg/dL (ref 0.44–1.00)
GFR, Estimated: >60 mL/min (ref >60)
Glucose, Bld: 138 mg/dL — ABNORMAL HIGH (ref 70–99)
Potassium: 3.5 mmol/L (ref 3.5–5.1)
Sodium: 136 mmol/L (ref 135–145)

## 2022-10-26 LAB — CBC
HCT: 42.5 % (ref 36.0–46.0)
Hemoglobin: 13.5 g/dL (ref 12.0–15.0)
MCH: 27.9 pg (ref 26.0–34.0)
MCHC: 31.8 g/dL (ref 30.0–36.0)
MCV: 87.8 fL (ref 80.0–100.0)
Platelets: 254 10*3/uL (ref 150–400)
RBC: 4.84 MIL/uL (ref 3.87–5.11)
RDW: 18.3 % — ABNORMAL HIGH (ref 11.5–15.5)
WBC: 12.7 10*3/uL — ABNORMAL HIGH (ref 4.0–10.5)
nRBC: 0.2 % (ref 0.0–0.2)

## 2022-10-26 LAB — BRAIN NATRIURETIC PEPTIDE: B Natriuretic Peptide: 71.3 pg/mL (ref 0.0–100.0)

## 2022-10-26 LAB — D-DIMER, QUANTITATIVE: D-Dimer, Quant: 1.2 ug{FEU}/mL — ABNORMAL HIGH (ref 0.00–0.50)

## 2022-10-26 LAB — LACTIC ACID, PLASMA: Lactic Acid, Venous: 1.4 mmol/L (ref 0.5–1.9)

## 2022-10-26 LAB — TROPONIN I (HIGH SENSITIVITY): Troponin I (High Sensitivity): 4 ng/L (ref <18)

## 2022-10-26 LAB — SARS CORONAVIRUS 2 BY RT PCR: SARS Coronavirus 2 by RT PCR: NEGATIVE

## 2022-10-26 MED ORDER — ADULT MULTIVITAMIN W/MINERALS CH
1.0000 | ORAL_TABLET | Freq: Every day | ORAL | Status: DC
  Administered 2022-10-27 – 2022-11-01 (×6): 1 via ORAL
  Filled 2022-10-26 (×6): qty 1

## 2022-10-26 MED ORDER — FLUOXETINE HCL 20 MG PO CAPS
20.0000 mg | ORAL_CAPSULE | Freq: Every day | ORAL | Status: DC
  Administered 2022-10-27 – 2022-11-01 (×6): 20 mg via ORAL
  Filled 2022-10-26 (×6): qty 1

## 2022-10-26 MED ORDER — METHYLPREDNISOLONE SODIUM SUCC 125 MG IJ SOLR
125.0000 mg | INTRAMUSCULAR | Status: AC
  Administered 2022-10-26: 125 mg via INTRAVENOUS
  Filled 2022-10-26: qty 2

## 2022-10-26 MED ORDER — LORAZEPAM 2 MG/ML IJ SOLN: 1.0000 mg | INTRAMUSCULAR | Status: DC | PRN

## 2022-10-26 MED ORDER — SODIUM CHLORIDE 0.9 % IV SOLN
2.0000 g | INTRAVENOUS | Status: DC
  Administered 2022-10-26: 2 g via INTRAVENOUS
  Filled 2022-10-26: qty 20

## 2022-10-26 MED ORDER — QUETIAPINE FUMARATE 25 MG PO TABS
50.0000 mg | ORAL_TABLET | Freq: Every day | ORAL | Status: DC
  Administered 2022-10-27 – 2022-10-28 (×2): 50 mg via ORAL
  Filled 2022-10-26 (×2): qty 2

## 2022-10-26 MED ORDER — ALBUTEROL SULFATE (2.5 MG/3ML) 0.083% IN NEBU
5.0000 mg | INHALATION_SOLUTION | Freq: Once | RESPIRATORY_TRACT | Status: AC
  Administered 2022-10-26: 5 mg via RESPIRATORY_TRACT
  Filled 2022-10-26: qty 6

## 2022-10-26 MED ORDER — IPRATROPIUM-ALBUTEROL 0.5-2.5 (3) MG/3ML IN SOLN
3.0000 mL | Freq: Once | RESPIRATORY_TRACT | Status: AC
  Administered 2022-10-26: 3 mL via RESPIRATORY_TRACT
  Filled 2022-10-26: qty 3

## 2022-10-26 MED ORDER — IOHEXOL 350 MG/ML SOLN
100.0000 mL | Freq: Once | INTRAVENOUS | Status: AC | PRN
  Administered 2022-10-26: 75 mL via INTRAVENOUS

## 2022-10-26 MED ORDER — METHYLPREDNISOLONE SODIUM SUCC 40 MG IJ SOLR
40.0000 mg | Freq: Two times a day (BID) | INTRAMUSCULAR | Status: DC
  Administered 2022-10-27 – 2022-10-30 (×7): 40 mg via INTRAVENOUS
  Filled 2022-10-26 (×7): qty 1

## 2022-10-26 MED ORDER — PANTOPRAZOLE SODIUM 40 MG IV SOLR
40.0000 mg | Freq: Two times a day (BID) | INTRAVENOUS | Status: DC
  Administered 2022-10-27 – 2022-10-29 (×6): 40 mg via INTRAVENOUS
  Filled 2022-10-26 (×6): qty 10

## 2022-10-26 MED ORDER — LORAZEPAM 1 MG PO TABS: 1.0000 mg | ORAL_TABLET | ORAL | Status: DC | PRN

## 2022-10-26 MED ORDER — HEPARIN SODIUM (PORCINE) 5000 UNIT/ML IJ SOLN
5000.0000 [IU] | Freq: Three times a day (TID) | INTRAMUSCULAR | Status: DC
  Administered 2022-10-27 (×2): 5000 [IU] via SUBCUTANEOUS
  Filled 2022-10-26 (×2): qty 1

## 2022-10-26 MED ORDER — MAGNESIUM SULFATE 2 GM/50ML IV SOLN
2.0000 g | INTRAVENOUS | Status: AC
  Administered 2022-10-26: 2 g via INTRAVENOUS
  Filled 2022-10-26: qty 50

## 2022-10-26 MED ORDER — FOLIC ACID 1 MG PO TABS
1.0000 mg | ORAL_TABLET | Freq: Every day | ORAL | Status: DC
  Administered 2022-10-27 – 2022-11-01 (×6): 1 mg via ORAL
  Filled 2022-10-26 (×6): qty 1

## 2022-10-26 MED ORDER — SODIUM CHLORIDE 0.9 % IV SOLN
2.0000 g | INTRAVENOUS | Status: DC
  Administered 2022-10-27: 2 g via INTRAVENOUS
  Filled 2022-10-26 (×2): qty 20

## 2022-10-26 MED ORDER — THIAMINE HCL 100 MG/ML IJ SOLN
100.0000 mg | INTRAMUSCULAR | Status: DC
  Administered 2022-10-27 – 2022-10-31 (×6): 100 mg via INTRAVENOUS
  Filled 2022-10-26 (×6): qty 2

## 2022-10-26 MED ORDER — SODIUM CHLORIDE 0.9 % IV SOLN
500.0000 mg | INTRAVENOUS | Status: DC
  Administered 2022-10-27: 500 mg via INTRAVENOUS
  Filled 2022-10-26 (×2): qty 5

## 2022-10-26 MED ORDER — ATORVASTATIN CALCIUM 20 MG PO TABS
40.0000 mg | ORAL_TABLET | Freq: Every day | ORAL | Status: DC
  Administered 2022-10-27 – 2022-11-01 (×6): 40 mg via ORAL
  Filled 2022-10-26 (×6): qty 2

## 2022-10-26 MED ORDER — LACTATED RINGERS IV SOLN: Freq: Once | INTRAVENOUS | Status: AC

## 2022-10-26 MED ORDER — ALBUTEROL SULFATE (2.5 MG/3ML) 0.083% IN NEBU: 2.5000 mg | INHALATION_SOLUTION | Freq: Four times a day (QID) | RESPIRATORY_TRACT | Status: DC | PRN

## 2022-10-26 MED ORDER — SODIUM CHLORIDE 0.9 % IV SOLN
500.0000 mg | INTRAVENOUS | Status: DC
  Administered 2022-10-26: 500 mg via INTRAVENOUS
  Filled 2022-10-26: qty 5

## 2022-10-26 MED ORDER — ASPIRIN 81 MG PO TBEC
81.0000 mg | DELAYED_RELEASE_TABLET | Freq: Every day | ORAL | Status: DC
  Administered 2022-10-27 – 2022-11-01 (×6): 81 mg via ORAL
  Filled 2022-10-26 (×6): qty 1

## 2022-10-26 NOTE — Assessment & Plan Note (Signed)
UDS negative for cocaine.

## 2022-10-26 NOTE — Assessment & Plan Note (Signed)
Steroids continued.  PRN duoneb and albuterol. Nicotine patches.

## 2022-10-26 NOTE — Assessment & Plan Note (Signed)
Pt presenting with sob for past few days. Worse today with chest pain and DOE. Pt endorses cough and fatigue. Pt is not on oxygen at home. Chest xray shows opacities c/w infection or PNA.

## 2022-10-26 NOTE — Assessment & Plan Note (Signed)
Troponin added on pt was to have stress test but was cancelled.  We will do echo and stress test in am per AM team.

## 2022-10-26 NOTE — Assessment & Plan Note (Signed)
Nicotine patch/ counseling once stable.

## 2022-10-26 NOTE — Assessment & Plan Note (Signed)
Today's Vitals   10/26/22 2023 10/26/22 2024 10/26/22 2145  BP: (!) 131/116    Pulse: (!) 120  (!) 112  Resp: (!) 22  17  Temp: 98 F (36.7 C)    TempSrc: Oral    SpO2: (!) 88%  97%  Weight:  131.5 kg   Height:  5\' 2"  (1.575 m)   PainSc: 7      Body mass index is 53.04 kg/m. Pt started on Abx until CTA.

## 2022-10-26 NOTE — Assessment & Plan Note (Signed)
Procalcitonin pending.

## 2022-10-26 NOTE — ED Triage Notes (Signed)
Pt to ed from home via POV for SOB and CP. Pt has HX of COPD and asthma. Pt has some audible wheezing and increased RR at this time. Pt O2 at 86% on RA in triage. Pt is caox4.

## 2022-10-26 NOTE — Assessment & Plan Note (Signed)
CPAP per home settings.   

## 2022-10-26 NOTE — ED Provider Notes (Signed)
[facility] Provider Note    Event Date/Time   First MD Initiated Contact with Patient 10/26/22 2033     (approximate)   History   Chief Complaint: Shortness of Breath and Chest Pain   HPI  Brenda [name] is a 52 y.o. female with a history of polysubstance abuse, hypertension, bipolar disorder, GERD, morbid obesity who comes the ED complaining of worsening shortness of breath chest pain and productive cough for the past 3 days.  Does not use oxygen at home.  Denies fever or chills.     Physical Exam   Triage Vital Signs: ED Triage Vitals  Encounter Vitals Group     BP 10/26/22 2023 (!) 131/116     Systolic BP Percentile --      Diastolic BP Percentile --      Pulse Rate 10/26/22 2023 (!) 120     Resp 10/26/22 2023 (!) 22     Temp 10/26/22 2023 98 F (36.7 C)     Temp Source 10/26/22 2023 Oral     SpO2 10/26/22 2023 (!) 88 %     Weight 10/26/22 2024 290 lb (131.5 kg)     Height 10/26/22 2024 5\' 2"  (1.575 m)     Head Circumference --      Peak Flow --      Pain Score 10/26/22 2023 7     Pain Loc --      Pain Education --      Exclude from Growth Chart --     Most recent vital signs: Vitals:   10/26/22 2023 10/26/22 2145  BP: (!) 131/116   Pulse: (!) 120 (!) 112  Resp: (!) 22 17  Temp: 98 F (36.7 C)   SpO2: (!) 88% 97%    General: Awake, no distress.  CV:  Good peripheral perfusion.  Tachycardia heart rate 120 Resp:  Mild tachypnea.  Clear to auscultation bilaterally.  88% on room air. Abd:  No distention.  Soft nontender Other:  1+ pitting edema bilateral lower extremities, symmetric calf circumference   ED Results / Procedures / Treatments   Labs (all labs ordered are listed, but only abnormal results are displayed) Labs Reviewed  BASIC METABOLIC PANEL - Abnormal; Notable for the following components:      Result Value   Glucose, Bld 138 (*)    Calcium 8.5 (*)    All other components within normal limits  CBC -  Abnormal; Notable for the following components:   WBC 12.7 (*)    RDW 18.3 (*)    All other components within normal limits  HEPATIC FUNCTION PANEL - Abnormal; Notable for the following components:   Albumin 3.1 (*)    Alkaline Phosphatase 127 (*)    Bilirubin, Direct 0.3 (*)    All other components within normal limits  D-DIMER, QUANTITATIVE - Abnormal; Notable for the following components:   D-Dimer, Quant 1.20 (*)    All other components within normal limits  SARS CORONAVIRUS 2 BY RT PCR  CULTURE, BLOOD (ROUTINE X 2)  CULTURE, BLOOD (ROUTINE X 2)  LACTIC ACID, PLASMA  LACTIC ACID, PLASMA  PROCALCITONIN  BRAIN NATRIURETIC PEPTIDE  ETHANOL  HEMOGLOBIN A1C  HIV ANTIBODY (ROUTINE TESTING W REFLEX)  COMPREHENSIVE METABOLIC PANEL  CBC  TROPONIN I (HIGH SENSITIVITY)  TROPONIN I (HIGH SENSITIVITY)     EKG Interpreted by me Sinus tachycardia rate 123.  Normal axis, normal intervals.  Poor R wave progression.  Normal ST segments and  T waves.  No ischemic changes.   RADIOLOGY Chest x-ray interpreted by me, shows multifocal infiltrates consistent with pneumonia.  Radiology report reviewed  CT angiogram negative for PE   PROCEDURES:  .Critical Care  Performed by: Sharman Cheek, MD Authorized by: Sharman Cheek, MD   Critical care provider statement:    Critical care time (minutes):  35   Critical care time was exclusive of:  Separately billable procedures and treating other patients   Critical care was necessary to treat or prevent imminent or life-threatening deterioration of the following conditions:  Sepsis and respiratory failure   Critical care was time spent personally by me on the following activities:  Development of treatment plan with patient or surrogate, discussions with consultants, evaluation of patient's response to treatment, examination of patient, obtaining history from patient or surrogate, ordering and performing treatments and interventions,  ordering and review of laboratory studies, ordering and review of radiographic studies, pulse oximetry, re-evaluation of patient's condition and review of old charts   Care discussed with: admitting provider      MEDICATIONS ORDERED IN ED: Medications  thiamine (VITAMIN B1) injection 100 mg (has no administration in time range)  methylPREDNISolone sodium succinate (SOLU-MEDROL) 40 mg/mL injection 40 mg (has no administration in time range)  albuterol (PROVENTIL) (2.5 MG/3ML) 0.083% nebulizer solution 2.5 mg (has no administration in time range)  aspirin EC tablet 81 mg (has no administration in time range)  atorvastatin (LIPITOR) tablet 40 mg (has no administration in time range)  FLUoxetine (PROZAC) capsule 20 mg (has no administration in time range)  QUEtiapine (SEROQUEL) tablet 50 mg (has no administration in time range)  cefTRIAXone (ROCEPHIN) 2 g in sodium chloride 0.9 % 100 mL IVPB (has no administration in time range)  heparin injection 5,000 Units (has no administration in time range)  lactated ringers infusion (has no administration in time range)  azithromycin (ZITHROMAX) 500 mg in sodium chloride 0.9 % 250 mL IVPB (has no administration in time range)  pantoprazole (PROTONIX) injection 40 mg (has no administration in time range)  LORazepam (ATIVAN) tablet 1-4 mg (has no administration in time range)    Or  LORazepam (ATIVAN) injection 1-4 mg (has no administration in time range)  folic acid (FOLVITE) tablet 1 mg (has no administration in time range)  multivitamin with minerals tablet 1 tablet (has no administration in time range)  methylPREDNISolone sodium succinate (SOLU-MEDROL) 125 mg/2 mL injection 125 mg (125 mg Intravenous Given 10/26/22 2137)  ipratropium-albuterol (DUONEB) 0.5-2.5 (3) MG/3ML nebulizer solution 3 mL (3 mLs Nebulization Given 10/26/22 2138)  albuterol (PROVENTIL) (2.5 MG/3ML) 0.083% nebulizer solution 5 mg (5 mg Nebulization Given 10/26/22 2138)  magnesium  sulfate IVPB 2 g 50 mL (0 g Intravenous Stopped 10/26/22 2142)     IMPRESSION / MDM / ASSESSMENT AND PLAN / ED COURSE  I reviewed the triage vital signs and the nursing notes.  DDx: Pneumonia, sepsis, pleural effusion, pulmonary edema, pulmonary embolism, electrolyte abnormality, anemia, non-STEMI, COVID  Patient's presentation is most consistent with acute presentation with potential threat to life or bodily function.  Patient presents with shortness of breath, chest pain.  She has tachycardia, tachypnea, hypoxia requiring nasal cannula.  No fever.  Labs are overall reassuring, chest x-ray consistent with pneumonia.  Will start Rocephin and azithromycin for initial sepsis management.   ----------------------------------------- 11:18 PM on 10/26/2022 ----------------------------------------- Case discussed with hospitalist for further management.      FINAL CLINICAL IMPRESSION(S) / ED DIAGNOSES   Final diagnoses:  Acute respiratory failure with hypoxia (HCC)  Multifocal pneumonia     Rx / DC Orders   ED Discharge Orders     None        Note:  This document was prepared using Dragon voice recognition software and may include unintentional dictation errors.   Sharman Cheek, MD 10/27/22 [phone]

## 2022-10-26 NOTE — Assessment & Plan Note (Signed)
Ethanol level added on.

## 2022-10-26 NOTE — H&P (Signed)
History and Physical    Patient: Brenda [name] HQI:[id number] DOB: 07-20-1970 DOA: 10/26/2022 DOS: the patient was seen and examined on 10/26/2022 PCP: [facility], [facility]  Patient coming from: Home   Chief Complaint:  Chief Complaint  Patient presents with   Shortness of Breath   Chest Pain    HPI: Brenda [name] is a 52 y.o. female with medical history significant for Patient is a 52 year old femalemedical history significant of polysubstance abuse, IV drug user, hypertension, COPD, asthma, GERD, depression, anxiety, HCV, bipolar, alcohol abuse, tobacco abuse, dCHF, discitis of T-spine coming today for shortness of breath and chest pain.  Initial presentation with O2 sats of 86% on room air.   Chart review shows initial vitals with heart rate of 120s and respirations of 22 O2 sats of 88% patient meeting sepsis criteria blood cultures collected in the emergency room.  Initial metabolic panel showed glucose of 138 normal kidney function, hepatic function added and pending.  Troponin 4.  Lactic acid of 1.4.  Leukocytosis of 12.7, normal hemoglobin at 13.5 and normal platelet count.  COVID-negative in the emergency room.  UDS showing and feta means and benzos.  Chest x-ray shows patchy bilateral airspace disease with a borderline heart size findings concerning with edema or infection. In the emergency room patient was given Solu-Medrol DuoNeb albuterol magnesium. EKG today shows sinus tachycardia at 123 PR of 160 QTc of 438 EKG is abnormal showing inferior infarct and anterior infarct.  Nonspecific T wave changes in inferior leads otherwise, similar to her 2023 EKG.   Review of Systems: Review of Systems  Constitutional:  Positive for chills and fever.  Respiratory:  Positive for cough and shortness of breath.   Cardiovascular:  Positive for chest pain.    Past Medical History:  Diagnosis Date   Ankle fracture, left    in past.   Anxiety    Arthritis    Right hip,  scheduled for replacement 7/13/33m, left ankle   Asthma    Bipolar disorder (HCC)    Chronic hepatitis C (HCC)    COPD (chronic obstructive pulmonary disease) (HCC)    Depression    Dyspnea    occasional with exertion   GERD (gastroesophageal reflux disease)    pmh   Headache    Hearing loss    some loss in both ears - no dx - per patient "runs in family", no hearing aids   Hypertension    Lump in female breast    right   Pneumonia    x 1   Polysubstance abuse (HCC)    Primary localized osteoarthritis of hip    right   SVD (spontaneous vaginal delivery)    x 3   Uses roller walker    occasional   Wears dentures    full upper   Past Surgical History:  Procedure Laterality Date   TOTAL HIP ARTHROPLASTY Right 09/13/2018   TOTAL HIP ARTHROPLASTY Right 09/13/2018   Procedure: RIGHT TOTAL HIP ARTHROPLASTY ANTERIOR APPROACH;  Surgeon: Tarry Kos, MD;  Location: [facility] OR;  Service: Orthopedics;  Laterality: Right;   TUBAL LIGATION     Social History:   reports that she has been smoking cigarettes. She has a 31 pack-year smoking history. She has never used smokeless tobacco. She reports current alcohol use. She reports that she does not currently use drugs after having used the following drugs: Cocaine and Heroin. Frequency: 7.00 times per week.  Allergies  Allergen Reactions   Flexeril [Cyclobenzaprine]  Other (See Comments)    RLS-like symptoms    Quetiapine Fumarate Other (See Comments)    RLS-like symptoms only at doses greater than 100mg  per patient.    Family History  Problem Relation Age of Onset   Ovarian cancer Neg Hx    Colon cancer Neg Hx    Breast cancer Neg Hx     Prior to Admission medications   Medication Sig Start Date End Date Taking? Authorizing Provider  albuterol (PROAIR HFA) 108 (90 Base) MCG/ACT inhaler Inhale 2 puffs into the lungs once every 6 (six) hours as needed for wheezing. 02/05/21     aspirin EC 81 MG tablet Take 1 tablet (81 mg total) by  mouth once daily. Swallow whole. 07/10/21     atorvastatin (LIPITOR) 40 MG tablet Take 1 tablet (40 mg total) by mouth once daily. 07/10/21     esomeprazole (NEXIUM) 40 MG capsule Take 1 capsule by mouth once a day 02/05/21     FLUoxetine (PROZAC) 20 MG capsule Take 1 capsule (20 mg total) by mouth once daily. 07/10/21     folic acid (FOLVITE) 1 MG tablet Take 1 tablet (1 mg total) by mouth once daily. 08/30/20   Alford Highland, MD  ibuprofen (ADVIL) 800 MG tablet TAKE 1 TABLET BY MOUTH ONCE EVERY EIGHT HOURS WITH FOOD AS NEEDED FOR HAND PAIN. 04/04/21     methocarbamol (ROBAXIN) 750 MG tablet Take 1 tablet (750 mg total) by mouth once every 12 (twelve) hours as needed for muscle spasm. 07/10/21     QUEtiapine (SEROQUEL) 50 MG tablet Take 1 tablet (50 mg total) by mouth once daily. 07/10/21     thiamine (VITAMIN B-1) 100 MG tablet Take 1 tablet (100 mg total) by mouth once daily. 08/30/20   Alford Highland, MD  Tiotropium Bromide Monohydrate (SPIRIVA RESPIMAT) 1.25 MCG/ACT AERS INHALE 1 PUFF INTO THE LUNGS ONCE DAILY. 02/05/21        Vitals:   10/26/22 2023 10/26/22 2024 10/26/22 2145  BP: (!) 131/116    Pulse: (!) 120  (!) 112  Resp: (!) 22  17  Temp: 98 F (36.7 C)    TempSrc: Oral    SpO2: (!) 88%  97%  Weight:  131.5 kg   Height:  5\' 2"  (1.575 m)    Physical Exam Vitals and nursing note reviewed.  Constitutional:      General: She is not in acute distress.    Interventions: Nasal cannula in place.  HENT:     Head: Normocephalic and atraumatic.     Right Ear: Hearing normal.     Left Ear: Hearing normal.     Nose: Nose normal. No nasal deformity.     Mouth/Throat:     Lips: Pink.     Tongue: No lesions.     Pharynx: Oropharynx is clear.  Eyes:     General: Lids are normal.     Extraocular Movements: Extraocular movements intact.  Cardiovascular:     Rate and Rhythm: Normal rate and regular rhythm.     Heart sounds: Normal heart sounds.  Pulmonary:     Effort: Pulmonary effort  is normal.     Breath sounds: Examination of the right-upper field reveals wheezing. Examination of the left-upper field reveals wheezing. Examination of the right-middle field reveals wheezing. Examination of the left-middle field reveals wheezing. Examination of the right-lower field reveals wheezing. Examination of the left-lower field reveals wheezing. Wheezing present.  Abdominal:     General: Bowel sounds  are normal. There is no distension.     Palpations: Abdomen is soft. There is no mass.     Tenderness: There is no abdominal tenderness.  Musculoskeletal:     Right lower leg: No edema.     Left lower leg: No edema.  Skin:    General: Skin is warm.  Neurological:     General: No focal deficit present.     Mental Status: She is alert and oriented to person, place, and time.     Cranial Nerves: Cranial nerves 2-12 are intact.  Psychiatric:        Attention and Perception: Attention normal.        Mood and Affect: Mood normal.        Speech: Speech normal.        Behavior: Behavior normal. Behavior is cooperative.    Labs on Admission: I have personally reviewed following labs and imaging studies  CBC: Recent Labs  Lab 10/26/22 2127  WBC 12.7*  HGB 13.5  HCT 42.5  MCV 87.8  PLT 254   Basic Metabolic Panel: Recent Labs  Lab 10/26/22 2127  NA 136  K 3.5  CL 99  CO2 27  GLUCOSE 138*  BUN 7  CREATININE 0.69  CALCIUM 8.5*   GFR: Estimated Creatinine Clearance: 109.8 mL/min (by C-G formula based on SCr of 0.69 mg/dL). Liver Function Tests: Recent Labs  Lab 10/26/22 2127  AST 30  ALT 20  ALKPHOS 127*  BILITOT 0.8  PROT 7.8  ALBUMIN 3.1*   No results for input(s): "LIPASE", "AMYLASE" in the last 168 hours. No results for input(s): "AMMONIA" in the last 168 hours. Coagulation Profile: No results for input(s): "INR", "PROTIME" in the last 168 hours. Cardiac Enzymes: No results for input(s): "CKTOTAL", "CKMB", "CKMBINDEX", "TROPONINI" in the last 168  hours. BNP (last 3 results) No results for input(s): "PROBNP" in the last 8760 hours. HbA1C: No results for input(s): "HGBA1C" in the last 72 hours. CBG: No results for input(s): "GLUCAP" in the last 168 hours. Lipid Profile: No results for input(s): "CHOL", "HDL", "LDLCALC", "TRIG", "CHOLHDL", "LDLDIRECT" in the last 72 hours. Thyroid Function Tests: No results for input(s): "TSH", "T4TOTAL", "FREET4", "T3FREE", "THYROIDAB" in the last 72 hours. Anemia Panel: No results for input(s): "VITAMINB12", "FOLATE", "FERRITIN", "TIBC", "IRON", "RETICCTPCT" in the last 72 hours. Urinalysis    Component Value Date/Time   COLORURINE AMBER (A) 05/31/2021 0923   APPEARANCEUR CLOUDY (A) 05/31/2021 0923   APPEARANCEUR Clear 12/22/2018 1203   LABSPEC 1.024 05/31/2021 0923   LABSPEC 1.019 05/11/2014 1424   PHURINE 5.0 05/31/2021 0923   GLUCOSEU NEGATIVE 05/31/2021 0923   GLUCOSEU Negative 05/11/2014 1424   HGBUR NEGATIVE 05/31/2021 0923   BILIRUBINUR NEGATIVE 05/31/2021 0923   BILIRUBINUR Negative 12/22/2018 1203   BILIRUBINUR 2+ 05/11/2014 1424   KETONESUR NEGATIVE 05/31/2021 0923   PROTEINUR 100 (A) 05/31/2021 0923   UROBILINOGEN 0.2 09/29/2018 1514   NITRITE NEGATIVE 05/31/2021 0923   LEUKOCYTESUR MODERATE (A) 05/31/2021 0923   LEUKOCYTESUR Trace 05/11/2014 1424   Unresulted Labs (From admission, onward)     Start     Ordered   10/27/22 0500  Comprehensive metabolic panel  Tomorrow morning,   R        10/26/22 2305   10/27/22 0500  CBC  Tomorrow morning,   R        10/26/22 2305   10/26/22 2303  Hemoglobin A1c  Add-on,   AD        10/26/22  2305   10/26/22 2303  HIV Antibody (routine testing w rflx)  (HIV Antibody (Routine testing w reflex) panel)  Once,   R        10/26/22 2305   10/26/22 2255  Ethanol  Add-on,   AD        10/26/22 2254   10/26/22 2250  Procalcitonin  Once,   URGENT       References:    Procalcitonin Lower Respiratory Tract Infection AND Sepsis Procalcitonin  Algorithm   10/26/22 2249   10/26/22 2101  Lactic acid, plasma  (Septic presentation on arrival (screening labs, nursing and treatment orders for obvious sepsis))  Now then every 2 hours,   STAT      10/26/22 2100   10/26/22 2101  Blood Culture (routine x 2)  (Septic presentation on arrival (screening labs, nursing and treatment orders for obvious sepsis))  BLOOD CULTURE X 2,   STAT      10/26/22 2100           Medications  thiamine (VITAMIN B1) injection 100 mg (has no administration in time range)  methylPREDNISolone sodium succinate (SOLU-MEDROL) 40 mg/mL injection 40 mg (has no administration in time range)  albuterol (PROVENTIL) (2.5 MG/3ML) 0.083% nebulizer solution 2.5 mg (has no administration in time range)  aspirin EC tablet 81 mg (has no administration in time range)  atorvastatin (LIPITOR) tablet 40 mg (has no administration in time range)  FLUoxetine (PROZAC) capsule 20 mg (has no administration in time range)  QUEtiapine (SEROQUEL) tablet 50 mg (has no administration in time range)  cefTRIAXone (ROCEPHIN) 2 g in sodium chloride 0.9 % 100 mL IVPB (has no administration in time range)  heparin injection 5,000 Units (has no administration in time range)  lactated ringers infusion (has no administration in time range)  azithromycin (ZITHROMAX) 500 mg in sodium chloride 0.9 % 250 mL IVPB (has no administration in time range)  pantoprazole (PROTONIX) injection 40 mg (has no administration in time range)  LORazepam (ATIVAN) tablet 1-4 mg (has no administration in time range)    Or  LORazepam (ATIVAN) injection 1-4 mg (has no administration in time range)  folic acid (FOLVITE) tablet 1 mg (has no administration in time range)  multivitamin with minerals tablet 1 tablet (has no administration in time range)  iohexol (OMNIPAQUE) 350 MG/ML injection 100 mL (has no administration in time range)  methylPREDNISolone sodium succinate (SOLU-MEDROL) 125 mg/2 mL injection 125 mg (125 mg  Intravenous Given 10/26/22 2137)  ipratropium-albuterol (DUONEB) 0.5-2.5 (3) MG/3ML nebulizer solution 3 mL (3 mLs Nebulization Given 10/26/22 2138)  albuterol (PROVENTIL) (2.5 MG/3ML) 0.083% nebulizer solution 5 mg (5 mg Nebulization Given 10/26/22 2138)  magnesium sulfate IVPB 2 g 50 mL (0 g Intravenous Stopped 10/26/22 2142)    Radiological Exams on Admission: DG Chest Port 1 View  Result Date: 10/26/2022 CLINICAL DATA:  Shortness of breath, chest pain EXAM: PORTABLE CHEST 1 VIEW COMPARISON:  05/31/2021 FINDINGS: Heart is borderline in size. Patchy bilateral airspace opacities, most pronounced in the right lower lobe. No effusions. No acute bony abnormality. IMPRESSION: Patchy bilateral airspace disease with borderline heart size. Findings could reflect edema or infection. Electronically Signed   By: Charlett Nose M.D.   On: 10/26/2022 20:42     Data Reviewed: Relevant notes from primary care and specialist visits, past discharge summaries as available in EHR, including Care Everywhere. Prior diagnostic testing as pertinent to current admission diagnoses Updated medications and problem lists for reconciliation ED course, including  vitals, labs, imaging, treatment and response to treatment Triage notes, nursing and pharmacy notes and ED provider's notes Notable results as noted in HPI Assessment & Plan Shortness of breath Pt presenting with sob for past few days. Worse today with chest pain and DOE. Pt endorses cough and fatigue. Pt is not on oxygen at home. Chest xray shows opacities c/w infection or PNA.     Acute respiratory failure with hypoxia (HCC) SpO2: 97 % on 2L [place]. D/d include PNA/ CHF/ PE.  CTA pending.  Less likely COPD.   Multifocal pneumonia Procalcitonin pending.  Sepsis (HCC) Today's Vitals   10/26/22 2023 10/26/22 2024 10/26/22 2145  BP: (!) 131/116    Pulse: (!) 120  (!) 112  Resp: (!) 22  17  Temp: 98 F (36.7 C)    TempSrc: Oral    SpO2: (!) 88%  97%   Weight:  131.5 kg   Height:  5\' 2"  (1.575 m)   PainSc: 7      Body mass index is 53.04 kg/m. Pt started on Abx until CTA.  COPD (chronic obstructive pulmonary disease) (HCC) Steroids continued.  PRN duoneb and albuterol. Nicotine patches.  Tobacco use disorder Nicotine patch / counseling once stable.  HTN (hypertension) Vitals:   10/26/22 2023  BP: (!) 131/116  No BP meds.   Cocaine use disorder, moderate, dependence (HCC) UDS negative for cocaine. Alcohol abuse Ethanol level added on.  Sleep apnea CPAP per home settings.  GERD (gastroesophageal reflux disease) IV PPI.  Chest pain Troponin added on pt was to have stress test but was cancelled.  We will do echo and stress test in am per AM team.     DVT prophylaxis:  Heparin   Consults:  None   Advance Care Planning:    Code Status: Full Code   Family Communication:  None   Disposition Plan:  Home   Severity of Illness: The appropriate patient status for this patient is INPATIENT. Inpatient status is judged to be reasonable and necessary in order to provide the required intensity of service to ensure the patient's safety. The patient's presenting symptoms, physical exam findings, and initial radiographic and laboratory data in the context of their chronic comorbidities is felt to place them at high risk for further clinical deterioration. Furthermore, it is not anticipated that the patient will be medically stable for discharge from the hospital within 2 midnights of admission.   * I certify that at the point of admission it is my clinical judgment that the patient will require inpatient hospital care spanning beyond 2 midnights from the point of admission due to high intensity of service, high risk for further deterioration and high frequency of surveillance required.*  Author: Gertha Calkin, MD 10/26/2022 11:28 PM  For on call review [email or web address].

## 2022-10-26 NOTE — Assessment & Plan Note (Signed)
Vitals:   10/26/22 2023  BP: (!) 131/116  No BP meds.

## 2022-10-26 NOTE — Assessment & Plan Note (Signed)
IV PPI. 

## 2022-10-26 NOTE — Assessment & Plan Note (Signed)
SpO2: 97 % on 2L [place]. D/d include PNA/ CHF/ PE.  CTA pending.  Less likely COPD.

## 2022-10-27 ENCOUNTER — Encounter: Payer: Self-pay | Admitting: Internal Medicine

## 2022-10-27 DIAGNOSIS — R0602 Shortness of breath: Secondary | ICD-10-CM | POA: Diagnosis not present

## 2022-10-27 DIAGNOSIS — E669 Obesity, unspecified: Secondary | ICD-10-CM

## 2022-10-27 LAB — COMPREHENSIVE METABOLIC PANEL
ALT: 20 U/L (ref 0–44)
AST: 25 U/L (ref 15–41)
Albumin: 2.9 g/dL — ABNORMAL LOW (ref 3.5–5.0)
Alkaline Phosphatase: 117 U/L (ref 38–126)
Anion gap: 12 (ref 5–15)
BUN: 8 mg/dL (ref 6–20)
CO2: 24 mmol/L (ref 22–32)
Calcium: 8.1 mg/dL — ABNORMAL LOW (ref 8.9–10.3)
Chloride: 99 mmol/L (ref 98–111)
Creatinine, Ser: 0.69 mg/dL (ref 0.44–1.00)
GFR, Estimated: >60 mL/min (ref >60)
Glucose, Bld: 186 mg/dL — ABNORMAL HIGH (ref 70–99)
Potassium: 3.8 mmol/L (ref 3.5–5.1)
Sodium: 135 mmol/L (ref 135–145)
Total Bilirubin: 0.6 mg/dL (ref 0.3–1.2)
Total Protein: 7.3 g/dL (ref 6.5–8.1)

## 2022-10-27 LAB — RESPIRATORY PANEL BY PCR

## 2022-10-27 LAB — HEMOGLOBIN A1C
Hgb A1c MFr Bld: 5.4 % (ref 4.8–5.6)
Mean Plasma Glucose: 108.28 mg/dL

## 2022-10-27 LAB — CBC
HCT: 40.9 % (ref 36.0–46.0)
Hemoglobin: 12.7 g/dL (ref 12.0–15.0)
MCH: 27.5 pg (ref 26.0–34.0)
MCHC: 31.1 g/dL (ref 30.0–36.0)
MCV: 88.7 fL (ref 80.0–100.0)
Platelets: 259 10*3/uL (ref 150–400)
RBC: 4.61 MIL/uL (ref 3.87–5.11)
RDW: 18.6 % — ABNORMAL HIGH (ref 11.5–15.5)
WBC: 14 10*3/uL — ABNORMAL HIGH (ref 4.0–10.5)
nRBC: 0.1 % (ref 0.0–0.2)

## 2022-10-27 LAB — ETHANOL: Alcohol, Ethyl (B): <10 mg/dL (ref <10)

## 2022-10-27 LAB — LACTIC ACID, PLASMA: Lactic Acid, Venous: 1.7 mmol/L (ref 0.5–1.9)

## 2022-10-27 LAB — PROCALCITONIN: Procalcitonin: 0.32 ng/mL

## 2022-10-27 LAB — TROPONIN I (HIGH SENSITIVITY): Troponin I (High Sensitivity): 3 ng/L (ref <18)

## 2022-10-27 LAB — HIV ANTIBODY (ROUTINE TESTING W REFLEX): HIV Screen 4th Generation wRfx: NONREACTIVE

## 2022-10-27 MED ORDER — IPRATROPIUM-ALBUTEROL 0.5-2.5 (3) MG/3ML IN SOLN
3.0000 mL | Freq: Four times a day (QID) | RESPIRATORY_TRACT | Status: DC
  Administered 2022-10-27 – 2022-10-29 (×8): 3 mL via RESPIRATORY_TRACT
  Filled 2022-10-27 (×4): qty 3
  Filled 2022-10-27: qty 6
  Filled 2022-10-27 (×2): qty 3

## 2022-10-27 MED ORDER — ENOXAPARIN SODIUM 80 MG/0.8ML IJ SOSY
0.5000 mg/kg | PREFILLED_SYRINGE | INTRAMUSCULAR | Status: DC
  Administered 2022-10-28 – 2022-11-01 (×5): 65 mg via SUBCUTANEOUS
  Filled 2022-10-27 (×5): qty 0.8

## 2022-10-27 MED ORDER — HEPARIN SODIUM (PORCINE) 5000 UNIT/ML IJ SOLN: 5000.0000 [IU] | Freq: Three times a day (TID) | INTRAMUSCULAR | Status: DC

## 2022-10-27 MED ORDER — BACLOFEN 10 MG PO TABS
10.0000 mg | ORAL_TABLET | Freq: Three times a day (TID) | ORAL | Status: DC
  Administered 2022-10-27 – 2022-11-01 (×14): 10 mg via ORAL
  Filled 2022-10-27 (×15): qty 1

## 2022-10-27 MED ORDER — INFLUENZA VIRUS VACC SPLIT PF (FLUZONE) 0.5 ML IM SUSY: 0.5000 mL | PREFILLED_SYRINGE | INTRAMUSCULAR | Status: DC

## 2022-10-27 NOTE — Progress Notes (Addendum)
PROGRESS NOTE    Brenda [name]  ZOX:[id number] DOB: 04/17/1970 DOA: 10/26/2022 PCP: Services, [facility]     Brief Narrative:   Brenda [name] is a 52 y.o. female with medical history significant for Patient is a 52 year old femalemedical history significant of polysubstance abuse, IV drug user, hypertension, COPD, asthma, GERD, depression, anxiety, HCV, bipolar, alcohol abuse, tobacco abuse, dCHF, discitis of T-spine coming today for shortness of breath and chest pain.  Initial presentation with O2 sats of 86% on room air.     Chart review shows initial vitals with heart rate of 120s and respirations of 22 O2 sats of 88% patient meeting sepsis criteria blood cultures collected in the emergency room.     Assessment & Plan:   Principal Problem:   Shortness of breath Active Problems:   Acute respiratory failure with hypoxia (HCC)   Multifocal pneumonia   Sepsis (HCC)   Chest pain   Tobacco use disorder   HTN (hypertension)   COPD (chronic obstructive pulmonary disease) (HCC)   Cocaine use disorder, moderate, dependence (HCC)   Alcohol abuse   Sleep apnea   GERD (gastroesophageal reflux disease)   Opioid use disorder, moderate, dependence (HCC)   IVDU (intravenous drug user)   Hepatitis C virus infection without hepatic coma  # CAP One week of progressive dyspnea and shortness of breath and cough. CTA no PE but patchy ground-glass opacities b/l likely representing multifocal pneumonia. Procal elevated to 0.32. BNP and troponins wnl. Denies recent antibiotics. May have underlying copd. Covid negative. Says last IVDU 6 years ago - f/u mrsa swab, respiratory viral panel, sputum for culture, urine legionella and strep antigens, hiv, blood cultures - continue ceftriaxone/azithromycin - continue methylpred for now  # COPD? With possible exacerbation - continue steroids and breathing treatments  # Acute hypoxic respiratory failure Here requiring 5 liters to  maintain O2 above 90. Secondary to CAP - O2, wean as able  # History polysubstance abuse # History vertebral discitis Reports occasional ingestion of opioids, says last IVDU was 6 years ago - f/u UDS and monitor blood cultures - f/u hcv  # MDD - continue home fluoxetine, seroquel  # Obesity  noted   DVT prophylaxis: lovenox Code Status: full Family Communication: none @ bedside  Level of care: Progressive Status is: Inpatient Remains inpatient appropriate because: severity of illness    Consultants:  none  Procedures: none  Antimicrobials:  Ceftriaxone/azithromycin    Subjective: Reports ongoing cough and shortness of breath  Objective: Vitals:   10/27/22 0400 10/27/22 0630 10/27/22 0658 10/27/22 0716  BP: 122/84 101/64    Pulse: 93 72 68   Resp: 19 19 17    Temp:    98.3 F (36.8 C)  TempSrc:    Oral  SpO2: 94% 91% 95%   Weight:      Height:       No intake or output data in the 24 hours ending 10/27/22 0839 Filed Weights   10/26/22 2024  Weight: 131.5 kg    Examination:  General exam: Appears uncomfortable  Respiratory system: scattered rales Cardiovascular system: S1 & S2 heard, RRR. No JVD, murmurs, rubs, gallops or clicks.   Gastrointestinal system: Abdomen is obese, soft and nontender.   Central nervous system: Alert and oriented. No focal neurological deficits. Extremities: Symmetric 5 x 5 power. Trace LE edema Skin: No rashes, lesions or ulcers Psychiatry: Judgement and insight appear normal. Mood & affect appropriate.     Data Reviewed: I have personally  reviewed following labs and imaging studies  CBC: Recent Labs  Lab 10/26/22 2127 10/27/22 0453  WBC 12.7* 14.0*  HGB 13.5 12.7  HCT 42.5 40.9  MCV 87.8 88.7  PLT 254 259   Basic Metabolic Panel: Recent Labs  Lab 10/26/22 2127 10/27/22 0453  NA 136 135  K 3.5 3.8  CL 99 99  CO2 27 24  GLUCOSE 138* 186*  BUN 7 8  CREATININE 0.69 0.69  CALCIUM 8.5* 8.1*    GFR: Estimated Creatinine Clearance: 109.8 mL/min (by C-G formula based on SCr of 0.69 mg/dL). Liver Function Tests: Recent Labs  Lab 10/26/22 2127 10/27/22 0453  AST 30 25  ALT 20 20  ALKPHOS 127* 117  BILITOT 0.8 0.6  PROT 7.8 7.3  ALBUMIN 3.1* 2.9*   No results for input(s): "LIPASE", "AMYLASE" in the last 168 hours. No results for input(s): "AMMONIA" in the last 168 hours. Coagulation Profile: No results for input(s): "INR", "PROTIME" in the last 168 hours. Cardiac Enzymes: No results for input(s): "CKTOTAL", "CKMB", "CKMBINDEX", "TROPONINI" in the last 168 hours. BNP (last 3 results) No results for input(s): "PROBNP" in the last 8760 hours. HbA1C: Recent Labs    10/26/22 0044  HGBA1C 5.4   CBG: No results for input(s): "GLUCAP" in the last 168 hours. Lipid Profile: No results for input(s): "CHOL", "HDL", "LDLCALC", "TRIG", "CHOLHDL", "LDLDIRECT" in the last 72 hours. Thyroid Function Tests: No results for input(s): "TSH", "T4TOTAL", "FREET4", "T3FREE", "THYROIDAB" in the last 72 hours. Anemia Panel: No results for input(s): "VITAMINB12", "FOLATE", "FERRITIN", "TIBC", "IRON", "RETICCTPCT" in the last 72 hours. Urine analysis:    Component Value Date/Time   COLORURINE AMBER (A) 05/31/2021 0923   APPEARANCEUR CLOUDY (A) 05/31/2021 0923   APPEARANCEUR Clear 12/22/2018 1203   LABSPEC 1.024 05/31/2021 0923   LABSPEC 1.019 05/11/2014 1424   PHURINE 5.0 05/31/2021 0923   GLUCOSEU NEGATIVE 05/31/2021 0923   GLUCOSEU Negative 05/11/2014 1424   HGBUR NEGATIVE 05/31/2021 0923   BILIRUBINUR NEGATIVE 05/31/2021 0923   BILIRUBINUR Negative 12/22/2018 1203   BILIRUBINUR 2+ 05/11/2014 1424   KETONESUR NEGATIVE 05/31/2021 0923   PROTEINUR 100 (A) 05/31/2021 0923   UROBILINOGEN 0.2 09/29/2018 1514   NITRITE NEGATIVE 05/31/2021 0923   LEUKOCYTESUR MODERATE (A) 05/31/2021 0923   LEUKOCYTESUR Trace 05/11/2014 1424   Sepsis  Labs: @LABRCNTIP (procalcitonin:4,lacticidven:4)  ) Recent Results (from the past 240 hour(s))  Blood Culture (routine x 2)     Status: None (Preliminary result)   Collection Time: 10/26/22  9:26 PM   Specimen: Right Antecubital; Blood  Result Value Ref Range Status   Specimen Description RIGHT ANTECUBITAL  Final   Special Requests   Final    BOTTLES DRAWN AEROBIC AND ANAEROBIC Blood Culture adequate volume   Culture   Final    NO GROWTH < 12 HOURS Performed at [facility], [address] [place], [place] [address]    Report Status PENDING  Incomplete  Blood Culture (routine x 2)     Status: None (Preliminary result)   Collection Time: 10/26/22  9:26 PM   Specimen: Left Antecubital; Blood  Result Value Ref Range Status   Specimen Description LEFT ANTECUBITAL  Final   Special Requests   Final    BOTTLES DRAWN AEROBIC AND ANAEROBIC Blood Culture adequate volume   Culture   Final    NO GROWTH < 12 HOURS Performed at [facility], [address], [place], [place] [address]    Report Status PENDING  Incomplete  SARS Coronavirus 2 by RT PCR (hospital order, performed in [facility] hospital lab) *cepheid single result test* Anterior Nasal Swab     Status: None   Collection Time: 10/26/22  9:27 PM   Specimen: Anterior Nasal Swab  Result Value Ref Range Status   SARS Coronavirus 2 by RT PCR NEGATIVE NEGATIVE Final    Comment: (NOTE) SARS-CoV-2 target nucleic acids are NOT DETECTED.  The SARS-CoV-2 RNA is generally detectable in upper and lower respiratory specimens during the acute phase of infection. The lowest concentration of SARS-CoV-2 viral copies this assay can detect is 250 copies / mL. A negative result does not preclude SARS-CoV-2 infection and should not be used as the sole basis for treatment or other patient management decisions.  A negative result may occur with improper specimen collection / handling, submission of specimen other than  nasopharyngeal swab, presence of viral mutation(s) within the areas targeted by this assay, and inadequate number of viral copies (<250 copies / mL). A negative result must be combined with clinical observations, patient history, and epidemiological information.  Fact Sheet for Patients:   RoadLapTop.co.za  Fact Sheet for Healthcare Providers: http://kim-miller.com/  This test is not yet approved or  cleared by the [place] FDA and has been authorized for detection and/or diagnosis of SARS-CoV-2 by FDA under an Emergency Use Authorization (EUA).  This EUA will remain in effect (meaning this test can be used) for the duration of the COVID-19 declaration under Section 564(b)(1) of the Act, 21 U.S.C. section 360bbb-3(b)(1), unless the authorization is terminated or revoked sooner.  Performed at [facility], [address] [place], [place] [address]          Radiology Studies: CT Angio Chest PE W and/or Wo Contrast  Result Date: 10/27/2022 CLINICAL DATA:  Positive D-dimer with shortness of breath and chest pain, initial encounter EXAM: CT ANGIOGRAPHY CHEST WITH CONTRAST TECHNIQUE: Multidetector CT imaging of the chest was performed using the standard protocol during bolus administration of intravenous contrast. Multiplanar CT image reconstructions and MIPs were obtained to evaluate the vascular anatomy. RADIATION DOSE REDUCTION: This exam was performed according to the departmental dose-optimization program which includes automated exposure control, adjustment of the mA and/or kV according to patient size and/or use of iterative reconstruction technique. CONTRAST:  75mL OMNIPAQUE IOHEXOL 350 MG/ML SOLN COMPARISON:  Chest x-ray from earlier in the same day. FINDINGS: Cardiovascular: Thoracic aorta shows a normal branching pattern. Mild atherosclerotic calcifications are noted. No aneurysmal dilatation or dissection is seen. Mild  cardiac enlargement is noted. The pulmonary artery shows a normal branching pattern bilaterally. No filling defect to suggest pulmonary embolism is noted. Mediastinum/Nodes: Thoracic inlet is within normal limits. No hilar or mediastinal adenopathy is noted. Scattered small stable likely reactive lymph nodes are seen. The esophagus is within normal limits. Lungs/Pleura: Lungs are well aerated bilaterally. Diffuse predominately ground-glass airspace opacity is noted roughly similar to that seen on the prior exam although slightly improved on the right when compared with the prior study. No sizable effusion is seen. No focal parenchymal nodules are noted. Upper Abdomen: Visualized upper abdomen is within normal limits. Musculoskeletal: Degenerative changes of the thoracic spine are noted. No acute rib abnormality is seen. Review of the MIP images confirms the above findings. IMPRESSION: No evidence of pulmonary emboli. The lungs bilaterally demonstrate diffuse patchy ground-glass opacities slightly improved when compared with the prior exam particularly on the right. Again these changes likely represent multifocal pneumonia/pneumonitis. Aortic Atherosclerosis (ICD10-I70.0). Electronically Signed  By: Alcide Clever M.D.   On: 10/27/2022 00:00   DG Chest Port 1 View  Result Date: 10/26/2022 CLINICAL DATA:  Shortness of breath, chest pain EXAM: PORTABLE CHEST 1 VIEW COMPARISON:  05/31/2021 FINDINGS: Heart is borderline in size. Patchy bilateral airspace opacities, most pronounced in the right lower lobe. No effusions. No acute bony abnormality. IMPRESSION: Patchy bilateral airspace disease with borderline heart size. Findings could reflect edema or infection. Electronically Signed   By: Charlett Nose M.D.   On: 10/26/2022 20:42        Scheduled Meds:  aspirin EC  81 mg Oral Daily   atorvastatin  40 mg Oral Daily   FLUoxetine  20 mg Oral Daily   folic acid  1 mg Oral Daily   heparin  5,000 Units Subcutaneous  Q8H   [START ON 10/28/2022] influenza vac split trivalent PF  0.5 mL Intramuscular Tomorrow-1000   methylPREDNISolone (SOLU-MEDROL) injection  40 mg Intravenous Q12H   multivitamin with minerals  1 tablet Oral Daily   pantoprazole (PROTONIX) IV  40 mg Intravenous Q12H   QUEtiapine  50 mg Oral Daily   thiamine (VITAMIN B1) injection  100 mg Intravenous Q24H   Continuous Infusions:  azithromycin     cefTRIAXone (ROCEPHIN)  IV       LOS: 1 day     Silvano Bilis, MD [facility]   If 7PM-7AM, please contact night-coverage [email or web address] Password [facility] 10/27/2022, 8:39 AM

## 2022-10-27 NOTE — ED Notes (Signed)
Pt requested a purwick and RN told pt that it is best practice for pt to get up to commode, charge RN asked for a purwick and said pt did not meet criteria. This RN set up bed side commode and pt will be changed. Pt states she is too short of breath to get up at this time

## 2022-10-27 NOTE — ED Notes (Signed)
Tech changed pt brief due to pt stating she is unable to assist in her care at this time

## 2022-10-27 NOTE — TOC Initial Note (Signed)
Transition of Care [facility]) - Initial/Assessment Note    Patient Details  Name: Brenda [name] MRN: [id number] Date of Birth: 07/12/1970  [facility] CM/SW Contact:    Marquita Palms, LCSW Phone Number: 10/27/2022, 3:51 PM  Clinical Narrative:                  CSW met with patient bedside. Pt reports that she has a front wheel walker and a wheelchair at home. Patient reports using [facility] in [place]. Patient declined SA resources. Pt reports she will be transported by her family. No needs at this time.  Expected Discharge Plan: Home/Self Care Barriers to Discharge: No Barriers Identified   Patient Goals and CMS Choice            Expected Discharge Plan and Services                                              Prior Living Arrangements/Services   Lives with:: Self                   Activities of Daily Living   ADL Screening (condition at time of admission) Does the patient have a NEW difficulty with bathing/dressing/toileting/self-feeding that is expected to last >3 days?: No Does the patient have a NEW difficulty with getting in/out of bed, walking, or climbing stairs that is expected to last >3 days?: No Does the patient have a NEW difficulty with communication that is expected to last >3 days?: No Is the patient deaf or have difficulty hearing?: No Does the patient have difficulty seeing, even when wearing glasses/contacts?: No Does the patient have difficulty concentrating, remembering, or making decisions?: No  Permission Sought/Granted                  Emotional Assessment Appearance:: Appears older than stated age, Disheveled Attitude/Demeanor/Rapport: Gracious Affect (typically observed): Calm Orientation: : Oriented to Self, Oriented to Place, Oriented to  Time, Oriented to Situation      Admission diagnosis:  SOB (shortness of breath) [R06.02] Patient Active Problem List   Diagnosis Date Noted   Morbid  obesity (HCC) 10/27/2022   Multifocal pneumonia 05/31/2021   Acute respiratory failure with hypoxia (HCC) 05/31/2021   COPD (chronic obstructive pulmonary disease) (HCC)    Elevated troponin    Discitis of thoracic region    Hepatitis C virus infection without hepatic coma    Citrobacter infection    Hepatic steatosis    Polysubstance abuse (HCC) 08/25/2020   IVDU (intravenous drug user) 08/25/2020   HTN (hypertension) 08/25/2020   Depression with anxiety 08/25/2020   Nausea & vomiting 08/25/2020   Chest pain 08/25/2020   Hypokalemia 08/25/2020   Hypomagnesemia 08/25/2020   Pulmonary nodule 08/25/2020   UTI (urinary tract infection) 08/25/2020   Osteomyelitis of thoracic spine (HCC) 08/25/2020   Cellulitis of right hand 08/25/2020   Bacteremia due to Gram-negative bacteria 08/25/2020   Smoking 02/22/2019   Cough 01/24/2019   Shortness of breath 01/24/2019   Sore throat 01/24/2019   Chest tightness 12/20/2018   History of hepatitis 10/26/2018   Urinary tract infection symptoms 10/26/2018   Sleep apnea 10/26/2018   Back pain 10/26/2018   Abnormal EKG 09/22/2018   History of asthma 09/22/2018   Right hip pain 09/22/2018   Status post right hip replacement 09/13/2018  Leg edema 08/22/2018   Primary osteoarthritis of right hip 07/27/2018   Vaginal discharge 07/14/2018   Sepsis (HCC) 04/30/2018   Opioid use disorder, moderate, dependence (HCC) 05/02/2016   Tobacco use disorder 05/02/2016   Severe recurrent major depression without psychotic features (HCC) 05/01/2016   Cocaine use disorder, moderate, dependence (HCC) 05/01/2016   Alcohol abuse 05/01/2016   GERD (gastroesophageal reflux disease) 05/01/2016   PCP:  Services, [facility]:   [facility] [place], [place] - [address] [place] [address] Phone: [phone] Fax: [phone]  [id number] [place], [place] - [address] AT [facility] [address] [place] [address] [place] [address] Phone: [phone] Fax: [phone]     Social Determinants of Health (SDOH) Social History: SDOH Screenings   Food Insecurity: No Food Insecurity (10/27/2022)  Housing: Low Risk  (10/27/2022)  Transportation Needs: No Transportation Needs (10/27/2022)  Utilities: Not At Risk (10/27/2022)  Alcohol Screen: Low Risk  (12/28/2016)  Depression (PHQ2-9): Medium Risk (07/13/2018)  Tobacco Use: High Risk (10/27/2022)   SDOH Interventions:     Readmission Risk Interventions     No data to display

## 2022-10-28 DIAGNOSIS — R0602 Shortness of breath: Secondary | ICD-10-CM | POA: Diagnosis not present

## 2022-10-28 LAB — URINE DRUG SCREEN, QUALITATIVE (ARMC ONLY)
Amphetamines, Ur Screen: NOT DETECTED
Barbiturates, Ur Screen: NOT DETECTED
Benzodiazepine, Ur Scrn: NOT DETECTED
Cannabinoid 50 Ng, Ur ~~LOC~~: NOT DETECTED
Cocaine Metabolite,Ur ~~LOC~~: NOT DETECTED
MDMA (Ecstasy)Ur Screen: NOT DETECTED
Methadone Scn, Ur: NOT DETECTED
Opiate, Ur Screen: NOT DETECTED
Phencyclidine (PCP) Ur S: NOT DETECTED
Tricyclic, Ur Screen: POSITIVE — AB

## 2022-10-28 LAB — EXPECTORATED SPUTUM ASSESSMENT W GRAM STAIN, RFLX TO RESP C

## 2022-10-28 LAB — CBC
HCT: 39.2 % (ref 36.0–46.0)
Hemoglobin: 12.3 g/dL (ref 12.0–15.0)
MCH: 28 pg (ref 26.0–34.0)
MCHC: 31.4 g/dL (ref 30.0–36.0)
MCV: 89.1 fL (ref 80.0–100.0)
Platelets: 278 10*3/uL (ref 150–400)
RBC: 4.4 MIL/uL (ref 3.87–5.11)
RDW: 18.5 % — ABNORMAL HIGH (ref 11.5–15.5)
WBC: 18.6 10*3/uL — ABNORMAL HIGH (ref 4.0–10.5)
nRBC: 0.1 % (ref 0.0–0.2)

## 2022-10-28 LAB — BLOOD GAS, VENOUS
Acid-Base Excess: 4.2 mmol/L — ABNORMAL HIGH (ref 0.0–2.0)
Bicarbonate: 29.2 mmol/L — ABNORMAL HIGH (ref 20.0–28.0)
O2 Saturation: 99.1 %
Patient temperature: 37
pCO2, Ven: 44 mm[Hg] (ref 44–60)
pH, Ven: 7.43 (ref 7.25–7.43)
pO2, Ven: 99 mm[Hg] — ABNORMAL HIGH (ref 32–45)

## 2022-10-28 LAB — BASIC METABOLIC PANEL
Anion gap: 8 (ref 5–15)
BUN: 18 mg/dL (ref 6–20)
CO2: 27 mmol/L (ref 22–32)
Calcium: 8.3 mg/dL — ABNORMAL LOW (ref 8.9–10.3)
Chloride: 102 mmol/L (ref 98–111)
Creatinine, Ser: 0.67 mg/dL (ref 0.44–1.00)
GFR, Estimated: >60 mL/min (ref >60)
Glucose, Bld: 134 mg/dL — ABNORMAL HIGH (ref 70–99)
Potassium: 4 mmol/L (ref 3.5–5.1)
Sodium: 137 mmol/L (ref 135–145)

## 2022-10-28 LAB — MRSA NEXT GEN BY PCR, NASAL: MRSA by PCR Next Gen: NOT DETECTED

## 2022-10-28 LAB — STREP PNEUMONIAE URINARY ANTIGEN: Strep Pneumo Urinary Antigen: NEGATIVE

## 2022-10-28 MED ORDER — QUETIAPINE FUMARATE 25 MG PO TABS
50.0000 mg | ORAL_TABLET | Freq: Every day | ORAL | Status: DC
  Administered 2022-10-29 – 2022-10-31 (×3): 50 mg via ORAL
  Filled 2022-10-28 (×3): qty 2

## 2022-10-28 MED ORDER — ALBUTEROL SULFATE (2.5 MG/3ML) 0.083% IN NEBU
2.5000 mg | INHALATION_SOLUTION | RESPIRATORY_TRACT | Status: DC | PRN
  Filled 2022-10-28 (×2): qty 3

## 2022-10-28 MED ORDER — ACETAMINOPHEN 325 MG PO TABS
650.0000 mg | ORAL_TABLET | Freq: Four times a day (QID) | ORAL | Status: DC | PRN
  Administered 2022-10-28 – 2022-10-29 (×3): 650 mg via ORAL
  Filled 2022-10-28 (×4): qty 2

## 2022-10-28 MED ORDER — HYDROCORTISONE 1 % EX CREA
1.0000 | TOPICAL_CREAM | Freq: Three times a day (TID) | CUTANEOUS | Status: DC | PRN
  Filled 2022-10-28: qty 28

## 2022-10-28 MED ORDER — SODIUM CHLORIDE 0.9 % IV SOLN
2.0000 g | Freq: Three times a day (TID) | INTRAVENOUS | Status: DC
  Administered 2022-10-28 – 2022-10-30 (×6): 2 g via INTRAVENOUS
  Filled 2022-10-28 (×7): qty 12.5

## 2022-10-28 NOTE — Plan of Care (Signed)
  Problem: Education: Goal: Knowledge of General Education information will improve Description: Including pain rating scale, medication(s)/side effects and non-pharmacologic comfort measures Outcome: Progressing   Problem: Clinical Measurements: Goal: Respiratory complications will improve Outcome: Progressing   Problem: Clinical Measurements: Goal: Cardiovascular complication will be avoided Outcome: Progressing   Problem: Elimination: Goal: Will not experience complications related to bowel motility Outcome: Progressing   Problem: Elimination: Goal: Will not experience complications related to urinary retention Outcome: Progressing   Problem: Pain Managment: Goal: General experience of comfort will improve Outcome: Progressing   Problem: Safety: Goal: Ability to remain free from injury will improve Outcome: Progressing

## 2022-10-28 NOTE — Plan of Care (Signed)

## 2022-10-28 NOTE — Consult Note (Signed)
Pharmacy Antibiotic Note  Brenda [name] is a 52 y.o. female admitted on 10/26/2022 with pneumonia.  Pharmacy has been consulted for cefepime dosing. Patient not responding to ceftriaxone + azithromycin regimen.  Plan: Cefepime 2gm IV q 8 hrs  Height: 5\' 2"  (157.5 cm) Weight: 131.5 kg (290 lb) IBW/kg (Calculated) : 50.1  Temp (24hrs), Avg:97.6 F (36.4 C), Min:97.4 F (36.3 C), Max:97.9 F (36.6 C)  Recent Labs  Lab 10/26/22 0044 10/26/22 2127 10/27/22 0453 10/28/22 0539  WBC  --  12.7* 14.0* 18.6*  CREATININE  --  0.69 0.69 0.67  LATICACIDVEN 1.7 1.4  --   --     Estimated Creatinine Clearance: 109.8 mL/min (by C-G formula based on SCr of 0.67 mg/dL).    Allergies  Allergen Reactions   Flexeril [Cyclobenzaprine] Other (See Comments)    RLS-like symptoms    Quetiapine Fumarate Other (See Comments)    RLS-like symptoms only at doses greater than 100mg  per patient.    Antimicrobials this admission: 9/29 ceftriaxone >> 10/1 9/29 azithromycin >> 10/1 10/1 cefepime >>  Dose adjustments this admission: n/a  Microbiology results:   Thank you for allowing pharmacy to be a part of this patient's care.  Akaya Proffit Rodriguez-Guzman PharmD, BCPS 10/28/2022 2:37 PM

## 2022-10-28 NOTE — TOC Progression Note (Signed)
Transition of Care [facility]) - Progression Note    Patient Details  Name: Brenda [name] MRN: [id number] Date of Birth: 1970/10/18  [facility] CM/SW Contact  Truddie Hidden, RN Phone Number: 10/28/2022, 10:08 AM  Clinical Narrative:    TOC continuing to follow patient's progress throughout discharge planning.   Expected Discharge Plan: Home/Self Care Barriers to Discharge: No Barriers Identified  Expected Discharge Plan and Services                                               Social Determinants of Health (SDOH) Interventions SDOH Screenings   Food Insecurity: No Food Insecurity (10/27/2022)  Housing: Low Risk  (10/27/2022)  Transportation Needs: No Transportation Needs (10/27/2022)  Utilities: Not At Risk (10/27/2022)  Alcohol Screen: Low Risk  (12/28/2016)  Depression (PHQ2-9): Medium Risk (07/13/2018)  Tobacco Use: High Risk (10/27/2022)    Readmission Risk Interventions     No data to display

## 2022-10-28 NOTE — Progress Notes (Signed)
Pt has sputum cup bedside for ordered sample.

## 2022-10-28 NOTE — Progress Notes (Signed)
PROGRESS NOTE    [name]  WUJ:[id number] DOB: April 04, 1970 DOA: 10/26/2022 PCP: Services, [facility]     Brief Narrative:   Brenda [name] is a 52 y.o. female with medical history significant for Patient is a 52 year old femalemedical history significant of polysubstance abuse, IV drug user, hypertension, COPD, asthma, GERD, depression, anxiety, HCV, bipolar, alcohol abuse, tobacco abuse, dCHF, discitis of T-spine coming today for shortness of breath and chest pain.  Initial presentation with O2 sats of 86% on room air.     Chart review shows initial vitals with heart rate of 120s and respirations of 22 O2 sats of 88% patient meeting sepsis criteria blood cultures collected in the emergency room.     Assessment & Plan:   Principal Problem:   Shortness of breath Active Problems:   Acute respiratory failure with hypoxia (HCC)   Multifocal pneumonia   Sepsis (HCC)   Chest pain   Tobacco use disorder   HTN (hypertension)   COPD (chronic obstructive pulmonary disease) (HCC)   Cocaine use disorder, moderate, dependence (HCC)   Alcohol abuse   Sleep apnea   GERD (gastroesophageal reflux disease)   Opioid use disorder, moderate, dependence (HCC)   IVDU (intravenous drug user)   Hepatitis C virus infection without hepatic coma   Morbid obesity (HCC)  # CAP One week of progressive dyspnea and shortness of breath and cough. CTA no PE but patchy ground-glass opacities b/l likely representing multifocal pneumonia. Procal elevated to 0.32. BNP and troponins wnl. Denies recent antibiotics. May have underlying copd. Covid negative and RVP negative. MRSA swab negative. HIV negative. Legionella neg. Says last IVDU 6 years ago. Has not improved. - f/u sputum for culture, urine strep antigen, blood cultures - will broaden abx from ceftriaxone/azithromycin to cefepime - continue methylpred for now  # COPD? With possible exacerbation - continue steroids and breathing  treatments  # Acute hypoxic respiratory failure Here requiring 5 liters initially to maintain O2 above 90, today 6 liters - O2, wean as able - vbg  # History polysubstance abuse # History vertebral discitis Reports occasional ingestion of opioids, says last IVDU was 6 years ago - f/u hcv  # MDD - continue home fluoxetine, seroquel  # Obesity  noted   DVT prophylaxis: lovenox Code Status: full Family Communication: none @ bedside  Level of care: Progressive Status is: Inpatient Remains inpatient appropriate because: severity of illness    Consultants:  none  Procedures: none  Antimicrobials:  Ceftriaxone/azithromycin>cefepime   Subjective: Reports ongoing cough and shortness of breath unchanged  Objective: Vitals:   10/28/22 0818 10/28/22 0906 10/28/22 1144 10/28/22 1352  BP:  112/68 124/64   Pulse: 69 79 76 66  Resp: 20 (!) 21 (!) 22 19  Temp:  97.9 F (36.6 C) (!) 97.4 F (36.3 C)   TempSrc:  Oral Oral   SpO2: 93% 97% 94% 96%  Weight:      Height:        Intake/Output Summary (Last 24 hours) at 10/28/2022 1410 Last data filed at 10/28/2022 1021 Gross per 24 hour  Intake --  Output 350 ml  Net -350 ml   Filed Weights   10/26/22 2024  Weight: 131.5 kg    Examination:  General exam: Appears uncomfortable  Respiratory system: scattered rales Cardiovascular system: S1 & S2 heard, RRR. No JVD, murmurs, rubs, gallops or clicks.   Gastrointestinal system: Abdomen is obese, soft and nontender.   Central nervous system: Alert and oriented. No  focal neurological deficits. Extremities: Symmetric 5 x 5 power. Trace LE edema Skin: No rashes, lesions or ulcers Psychiatry: Judgement and insight appear normal. Mood & affect appropriate.     Data Reviewed: I have personally reviewed following labs and imaging studies  CBC: Recent Labs  Lab 10/26/22 2127 10/27/22 0453 10/28/22 0539  WBC 12.7* 14.0* 18.6*  HGB 13.5 12.7 12.3  HCT 42.5 40.9 39.2   MCV 87.8 88.7 89.1  PLT 254 259 278   Basic Metabolic Panel: Recent Labs  Lab 10/26/22 2127 10/27/22 0453 10/28/22 0539  NA 136 135 137  K 3.5 3.8 4.0  CL 99 99 102  CO2 27 24 27   GLUCOSE 138* 186* 134*  BUN 7 8 18   CREATININE 0.69 0.69 0.67  CALCIUM 8.5* 8.1* 8.3*   GFR: Estimated Creatinine Clearance: 109.8 mL/min (by C-G formula based on SCr of 0.67 mg/dL). Liver Function Tests: Recent Labs  Lab 10/26/22 2127 10/27/22 0453  AST 30 25  ALT 20 20  ALKPHOS 127* 117  BILITOT 0.8 0.6  PROT 7.8 7.3  ALBUMIN 3.1* 2.9*   No results for input(s): "LIPASE", "AMYLASE" in the last 168 hours. No results for input(s): "AMMONIA" in the last 168 hours. Coagulation Profile: No results for input(s): "INR", "PROTIME" in the last 168 hours. Cardiac Enzymes: No results for input(s): "CKTOTAL", "CKMB", "CKMBINDEX", "TROPONINI" in the last 168 hours. BNP (last 3 results) No results for input(s): "PROBNP" in the last 8760 hours. HbA1C: Recent Labs    10/26/22 0044  HGBA1C 5.4   CBG: No results for input(s): "GLUCAP" in the last 168 hours. Lipid Profile: No results for input(s): "CHOL", "HDL", "LDLCALC", "TRIG", "CHOLHDL", "LDLDIRECT" in the last 72 hours. Thyroid Function Tests: No results for input(s): "TSH", "T4TOTAL", "FREET4", "T3FREE", "THYROIDAB" in the last 72 hours. Anemia Panel: No results for input(s): "VITAMINB12", "FOLATE", "FERRITIN", "TIBC", "IRON", "RETICCTPCT" in the last 72 hours. Urine analysis:    Component Value Date/Time   COLORURINE AMBER (A) 05/31/2021 0923   APPEARANCEUR CLOUDY (A) 05/31/2021 0923   APPEARANCEUR Clear 12/22/2018 1203   LABSPEC 1.024 05/31/2021 0923   LABSPEC 1.019 05/11/2014 1424   PHURINE 5.0 05/31/2021 0923   GLUCOSEU NEGATIVE 05/31/2021 0923   GLUCOSEU Negative 05/11/2014 1424   HGBUR NEGATIVE 05/31/2021 0923   BILIRUBINUR NEGATIVE 05/31/2021 0923   BILIRUBINUR Negative 12/22/2018 1203   BILIRUBINUR 2+ 05/11/2014 1424    KETONESUR NEGATIVE 05/31/2021 0923   PROTEINUR 100 (A) 05/31/2021 0923   UROBILINOGEN 0.2 09/29/2018 1514   NITRITE NEGATIVE 05/31/2021 0923   LEUKOCYTESUR MODERATE (A) 05/31/2021 0923   LEUKOCYTESUR Trace 05/11/2014 1424   Sepsis Labs: @LABRCNTIP (procalcitonin:4,lacticidven:4)  ) Recent Results (from the past 240 hour(s))  Blood Culture (routine x 2)     Status: None (Preliminary result)   Collection Time: 10/26/22  9:26 PM   Specimen: Right Antecubital; Blood  Result Value Ref Range Status   Specimen Description RIGHT ANTECUBITAL  Final   Special Requests   Final    BOTTLES DRAWN AEROBIC AND ANAEROBIC Blood Culture adequate volume   Culture   Final    NO GROWTH 2 DAYS Performed at [facility], [address] [place], [place] [address]    Report Status PENDING  Incomplete  Blood Culture (routine x 2)     Status: None (Preliminary result)   Collection Time: 10/26/22  9:26 PM   Specimen: Left Antecubital; Blood  Result Value Ref Range Status   Specimen Description LEFT ANTECUBITAL  Final  Special Requests   Final    BOTTLES DRAWN AEROBIC AND ANAEROBIC Blood Culture adequate volume   Culture   Final    NO GROWTH 2 DAYS Performed at [facility], [address] [place], [place] [address]    Report Status PENDING  Incomplete  SARS Coronavirus 2 by RT PCR (hospital order, performed in [facility] hospital lab) *cepheid single result test* Anterior Nasal Swab     Status: None   Collection Time: 10/26/22  9:27 PM   Specimen: Anterior Nasal Swab  Result Value Ref Range Status   SARS Coronavirus 2 by RT PCR NEGATIVE NEGATIVE Final    Comment: (NOTE) SARS-CoV-2 target nucleic acids are NOT DETECTED.  The SARS-CoV-2 RNA is generally detectable in upper and lower respiratory specimens during the acute phase of infection. The lowest concentration of SARS-CoV-2 viral copies this assay can detect is 250 copies / mL. A negative result does not preclude  SARS-CoV-2 infection and should not be used as the sole basis for treatment or other patient management decisions.  A negative result may occur with improper specimen collection / handling, submission of specimen other than nasopharyngeal swab, presence of viral mutation(s) within the areas targeted by this assay, and inadequate number of viral copies (<250 copies / mL). A negative result must be combined with clinical observations, patient history, and epidemiological information.  Fact Sheet for Patients:   RoadLapTop.co.za  Fact Sheet for Healthcare Providers: http://kim-miller.com/  This test is not yet approved or  cleared by the [place] FDA and has been authorized for detection and/or diagnosis of SARS-CoV-2 by FDA under an Emergency Use Authorization (EUA).  This EUA will remain in effect (meaning this test can be used) for the duration of the COVID-19 declaration under Section 564(b)(1) of the Act, 21 U.S.C. section 360bbb-3(b)(1), unless the authorization is terminated or revoked sooner.  Performed at [facility], [address] [place], [place] [address]   Respiratory (~20 pathogens) panel by PCR     Status: None   Collection Time: 10/27/22 12:42 PM   Specimen: Nasopharyngeal Swab; Respiratory  Result Value Ref Range Status   Adenovirus NOT DETECTED NOT DETECTED Final   Coronavirus 229E NOT DETECTED NOT DETECTED Final    Comment: (NOTE) The Coronavirus on the Respiratory Panel, DOES NOT test for the novel  Coronavirus (2019 nCoV)    Coronavirus HKU1 NOT DETECTED NOT DETECTED Final   Coronavirus NL63 NOT DETECTED NOT DETECTED Final   Coronavirus OC43 NOT DETECTED NOT DETECTED Final   Metapneumovirus NOT DETECTED NOT DETECTED Final   Rhinovirus / Enterovirus NOT DETECTED NOT DETECTED Final   Influenza A NOT DETECTED NOT DETECTED Final   Influenza B NOT DETECTED NOT DETECTED Final   Parainfluenza Virus 1  NOT DETECTED NOT DETECTED Final   Parainfluenza Virus 2 NOT DETECTED NOT DETECTED Final   Parainfluenza Virus 3 NOT DETECTED NOT DETECTED Final   Parainfluenza Virus 4 NOT DETECTED NOT DETECTED Final   Respiratory Syncytial Virus NOT DETECTED NOT DETECTED Final   Bordetella pertussis NOT DETECTED NOT DETECTED Final   Bordetella Parapertussis NOT DETECTED NOT DETECTED Final   Chlamydophila pneumoniae NOT DETECTED NOT DETECTED Final   Mycoplasma pneumoniae NOT DETECTED NOT DETECTED Final    Comment: Performed at [facility], [address], [place], [place] [address]  MRSA Next Gen by PCR, Nasal     Status: None   Collection Time: 10/28/22  4:35 AM  Result Value Ref Range Status  MRSA by PCR Next Gen NOT DETECTED NOT DETECTED Final    Comment: (NOTE) The GeneXpert MRSA Assay (FDA approved for NASAL specimens only), is one component of a comprehensive MRSA colonization surveillance program. It is not intended to diagnose MRSA infection nor to guide or monitor treatment for MRSA infections. Test performance is not FDA approved in patients less than 59 years old. Performed at [facility], [address] [place], [place] [address]   Expectorated Sputum Assessment w Gram Stain, Rflx to Resp Cult     Status: None   Collection Time: 10/28/22 10:35 AM   Specimen: Expectorated Sputum  Result Value Ref Range Status   Specimen Description EXPECTORATED SPUTUM  Final   Special Requests NONE  Final   Sputum evaluation   Final    Sputum specimen not acceptable for testing.  Please recollect.   CALLED TO [facility] MCDANIEL 10/28/22 1222 KLW Performed at [facility], [address] [place], [place] [address]    Report Status 10/28/2022 FINAL  Final         Radiology Studies: CT Angio Chest PE W and/or Wo Contrast  Result Date: 10/27/2022 CLINICAL DATA:  Positive D-dimer with shortness of breath and chest pain, initial encounter EXAM: CT ANGIOGRAPHY CHEST WITH  CONTRAST TECHNIQUE: Multidetector CT imaging of the chest was performed using the standard protocol during bolus administration of intravenous contrast. Multiplanar CT image reconstructions and MIPs were obtained to evaluate the vascular anatomy. RADIATION DOSE REDUCTION: This exam was performed according to the departmental dose-optimization program which includes automated exposure control, adjustment of the mA and/or kV according to patient size and/or use of iterative reconstruction technique. CONTRAST:  75mL OMNIPAQUE IOHEXOL 350 MG/ML SOLN COMPARISON:  Chest x-ray from earlier in the same day. FINDINGS: Cardiovascular: Thoracic aorta shows a normal branching pattern. Mild atherosclerotic calcifications are noted. No aneurysmal dilatation or dissection is seen. Mild cardiac enlargement is noted. The pulmonary artery shows a normal branching pattern bilaterally. No filling defect to suggest pulmonary embolism is noted. Mediastinum/Nodes: Thoracic inlet is within normal limits. No hilar or mediastinal adenopathy is noted. Scattered small stable likely reactive lymph nodes are seen. The esophagus is within normal limits. Lungs/Pleura: Lungs are well aerated bilaterally. Diffuse predominately ground-glass airspace opacity is noted roughly similar to that seen on the prior exam although slightly improved on the right when compared with the prior study. No sizable effusion is seen. No focal parenchymal nodules are noted. Upper Abdomen: Visualized upper abdomen is within normal limits. Musculoskeletal: Degenerative changes of the thoracic spine are noted. No acute rib abnormality is seen. Review of the MIP images confirms the above findings. IMPRESSION: No evidence of pulmonary emboli. The lungs bilaterally demonstrate diffuse patchy ground-glass opacities slightly improved when compared with the prior exam particularly on the right. Again these changes likely represent multifocal pneumonia/pneumonitis. Aortic  Atherosclerosis (ICD10-I70.0). Electronically Signed   By: Alcide Clever M.D.   On: 10/27/2022 00:00   DG Chest Port 1 View  Result Date: 10/26/2022 CLINICAL DATA:  Shortness of breath, chest pain EXAM: PORTABLE CHEST 1 VIEW COMPARISON:  05/31/2021 FINDINGS: Heart is borderline in size. Patchy bilateral airspace opacities, most pronounced in the right lower lobe. No effusions. No acute bony abnormality. IMPRESSION: Patchy bilateral airspace disease with borderline heart size. Findings could reflect edema or infection. Electronically Signed   By: Charlett Nose M.D.   On: 10/26/2022 20:42        Scheduled Meds:  aspirin EC  81 mg  Oral Daily   atorvastatin  40 mg Oral Daily   baclofen  10 mg Oral TID   enoxaparin (LOVENOX) injection  0.5 mg/kg Subcutaneous Q24H   FLUoxetine  20 mg Oral Daily   folic acid  1 mg Oral Daily   ipratropium-albuterol  3 mL Nebulization Q6H   methylPREDNISolone (SOLU-MEDROL) injection  40 mg Intravenous Q12H   multivitamin with minerals  1 tablet Oral Daily   pantoprazole (PROTONIX) IV  40 mg Intravenous Q12H   QUEtiapine  50 mg Oral Daily   thiamine (VITAMIN B1) injection  100 mg Intravenous Q24H   Continuous Infusions:  azithromycin 500 mg (10/27/22 2339)   cefTRIAXone (ROCEPHIN)  IV Stopped (10/27/22 2236)     LOS: 2 days     Silvano Bilis, MD [facility]   If 7PM-7AM, please contact night-coverage [email or web address] Password [email or web address] 10/28/2022, 2:10 PM

## 2022-10-28 NOTE — Plan of Care (Signed)

## 2022-10-29 DIAGNOSIS — J189 Pneumonia, unspecified organism: Secondary | ICD-10-CM

## 2022-10-29 DIAGNOSIS — J9601 Acute respiratory failure with hypoxia: Secondary | ICD-10-CM

## 2022-10-29 LAB — BASIC METABOLIC PANEL
Anion gap: 5 (ref 5–15)
BUN: 28 mg/dL — ABNORMAL HIGH (ref 6–20)
CO2: 28 mmol/L (ref 22–32)
Calcium: 8.3 mg/dL — ABNORMAL LOW (ref 8.9–10.3)
Chloride: 103 mmol/L (ref 98–111)
Creatinine, Ser: 0.8 mg/dL (ref 0.44–1.00)
GFR, Estimated: >60 mL/min (ref >60)
Glucose, Bld: 145 mg/dL — ABNORMAL HIGH (ref 70–99)
Potassium: 4.8 mmol/L (ref 3.5–5.1)
Sodium: 136 mmol/L (ref 135–145)

## 2022-10-29 LAB — CBC
HCT: 37.5 % (ref 36.0–46.0)
Hemoglobin: 12 g/dL (ref 12.0–15.0)
MCH: 27.7 pg (ref 26.0–34.0)
MCHC: 32 g/dL (ref 30.0–36.0)
MCV: 86.6 fL (ref 80.0–100.0)
Platelets: 295 10*3/uL (ref 150–400)
RBC: 4.33 MIL/uL (ref 3.87–5.11)
RDW: 18.4 % — ABNORMAL HIGH (ref 11.5–15.5)
WBC: 17.4 10*3/uL — ABNORMAL HIGH (ref 4.0–10.5)
nRBC: 0.2 % (ref 0.0–0.2)

## 2022-10-29 LAB — EXPECTORATED SPUTUM ASSESSMENT W GRAM STAIN, RFLX TO RESP C

## 2022-10-29 LAB — TROPONIN I (HIGH SENSITIVITY)
Troponin I (High Sensitivity): 4 ng/L (ref <18)
Troponin I (High Sensitivity): 3 ng/L (ref <18)

## 2022-10-29 LAB — LEGIONELLA PNEUMOPHILA SEROGP 1 UR AG: L. pneumophila Serogp 1 Ur Ag: NEGATIVE

## 2022-10-29 LAB — BRAIN NATRIURETIC PEPTIDE: B Natriuretic Peptide: 307.1 pg/mL — ABNORMAL HIGH (ref 0.0–100.0)

## 2022-10-29 MED ORDER — IPRATROPIUM-ALBUTEROL 0.5-2.5 (3) MG/3ML IN SOLN
3.0000 mL | Freq: Two times a day (BID) | RESPIRATORY_TRACT | Status: DC
  Administered 2022-10-29 – 2022-11-01 (×6): 3 mL via RESPIRATORY_TRACT
  Filled 2022-10-29 (×6): qty 3

## 2022-10-29 MED ORDER — IBUPROFEN 400 MG PO TABS
400.0000 mg | ORAL_TABLET | Freq: Three times a day (TID) | ORAL | Status: DC | PRN
  Administered 2022-10-29: 400 mg via ORAL
  Filled 2022-10-29: qty 1

## 2022-10-29 MED ORDER — FUROSEMIDE 10 MG/ML IJ SOLN
40.0000 mg | Freq: Two times a day (BID) | INTRAMUSCULAR | Status: DC
  Administered 2022-10-30 – 2022-11-01 (×5): 40 mg via INTRAVENOUS
  Filled 2022-10-29 (×5): qty 4

## 2022-10-29 MED ORDER — TRAMADOL HCL 50 MG PO TABS
25.0000 mg | ORAL_TABLET | Freq: Four times a day (QID) | ORAL | Status: DC | PRN
  Administered 2022-10-29: 25 mg via ORAL
  Filled 2022-10-29: qty 1

## 2022-10-29 MED ORDER — PANTOPRAZOLE SODIUM 40 MG PO TBEC
80.0000 mg | DELAYED_RELEASE_TABLET | Freq: Every day | ORAL | Status: DC
  Administered 2022-10-30 – 2022-11-01 (×3): 80 mg via ORAL
  Filled 2022-10-29 (×3): qty 2

## 2022-10-29 MED ORDER — TRAMADOL HCL 50 MG PO TABS
50.0000 mg | ORAL_TABLET | ORAL | Status: DC | PRN
  Administered 2022-10-29 (×2): 50 mg via ORAL
  Filled 2022-10-29 (×2): qty 1

## 2022-10-29 MED ORDER — FUROSEMIDE 10 MG/ML IJ SOLN
40.0000 mg | Freq: Every day | INTRAMUSCULAR | Status: DC
  Administered 2022-10-29: 40 mg via INTRAVENOUS
  Filled 2022-10-29: qty 4

## 2022-10-29 NOTE — Plan of Care (Signed)
  Problem: Education: Goal: Knowledge of General Education information will improve Description: Including pain rating scale, medication(s)/side effects and non-pharmacologic comfort measures Outcome: Progressing   Problem: Clinical Measurements: Goal: Respiratory complications will improve Outcome: Progressing   Problem: Clinical Measurements: Goal: Cardiovascular complication will be avoided Outcome: Progressing   Problem: Nutrition: Goal: Adequate nutrition will be maintained Outcome: Progressing   Problem: Pain Managment: Goal: General experience of comfort will improve Outcome: Progressing   Problem: Safety: Goal: Ability to remain free from injury will improve Outcome: Progressing   

## 2022-10-29 NOTE — Progress Notes (Signed)
Progress Note   Patient: Brenda [name] EXB:[id number] DOB: 1970-08-02 DOA: 10/26/2022     3 DOS: the patient was seen and examined on 10/29/2022   Brief hospital course: KRYSTALE BIONDI is a 52 y.o. female with medical history significant for Patient is a 52 year old femalemedical history significant of polysubstance abuse, IV drug user, hypertension, COPD, asthma, GERD, depression, anxiety, HCV, bipolar, alcohol abuse, tobacco abuse, dCHF, discitis of T-spine coming today for shortness of breath and chest pain.  Initial presentation with O2 sats of 86% on room air.  Her symptoms started a week ago before admission to the hospital.  COVID-negative, chest CT scan showed diffuse bilateral infiltrates. Patient started on IV steroids, then added IV Lasix.    Principal Problem:   Shortness of breath Active Problems:   Acute respiratory failure with hypoxia (HCC)   Multifocal pneumonia   Sepsis (HCC)   Chest pain   Tobacco use disorder   HTN (hypertension)   COPD (chronic obstructive pulmonary disease) (HCC)   Cocaine use disorder, moderate, dependence (HCC)   Alcohol abuse   Sleep apnea   GERD (gastroesophageal reflux disease)   Opioid use disorder, moderate, dependence (HCC)   IVDU (intravenous drug user)   Hepatitis C virus infection without hepatic coma   Morbid obesity (HCC)   Assessment and Plan: Acute hypoxemic respiratory failure secondary to bilateral pneumonia. Diffuse bilateral pneumonia. Sepsis secondary to pneumonia. I personally reviewed patient's CT scan images, patient had a diffuse bilateral groundglass changes with pneumonia, so far infectious workup has been negative including COVID, strep pneumo and Legionella.  Respiratory panel was also negative. However, patient did not come to the hospital until 7 days of symptom development, patient still could have had a COVID infection which has became negative in testing. Patient oxygenation is still getting worse,  currently on 7 L oxygen.  Will continue IV steroids, patient has a mild elevation in BNP, increase Lasix to 40 mg twice a day. Procalcitonin level will be checked, patient currently on cefepime.  Morbid obese.  With BMI 58.4. Complicated the patient prognosis, diet exercise.  COPD. Tobacco abuse  Advised to quit smoking, no bronchospasm.  Cocaine abuse. Alcohol abuse. Urine drug screen was negative cocaine. Continue watch for withdrawal, continue thiamine, CIWA protocol.  Obstructive sleep apnea. BiPAP while asleep.    Subjective:  Patient still has significant short of breath with exertion, high flow oxygen at 7 L today.  Physical Exam: Vitals:   10/29/22 0414 10/29/22 0727 10/29/22 0814 10/29/22 1117  BP: 119/75 104/71  134/78  Pulse:  65 78 67  Resp: 18 18 16 20   Temp: 97.9 F (36.6 C) 97.6 F (36.4 C)  97.8 F (36.6 C)  TempSrc: Oral     SpO2:  94% 95% 95%  Weight:      Height:       General exam: Appears calm and comfortable, morbidly obese. Respiratory system: Clear to auscultation. Respiratory effort normal. Cardiovascular system: S1 & S2 heard, RRR. No JVD, murmurs, rubs, gallops or clicks. No pedal edema. Gastrointestinal system: Abdomen is nondistended, soft and nontender. No organomegaly or masses felt. Normal bowel sounds heard. Central nervous system: Alert and oriented. No focal neurological deficits. Extremities: Symmetric 5 x 5 power. Skin: No rashes, lesions or ulcers Psychiatry: Judgement and insight appear normal. Mood & affect appropriate.    Data Reviewed:  Reviewed CT scan results, CT images, lab results.  Family Communication: None  Disposition: Status is: Inpatient Remains inpatient appropriate because:  Part of disease, IV treatment.     Time spent: 55 minutes  Author: Marrion Coy, MD 10/29/2022 3:43 PM  For on call review [email or web address].

## 2022-10-29 NOTE — Care Management Important Message (Signed)
Important Message  Patient Details  Name: Brenda [name] MRN: [id number] Date of Birth: [birth year]   Important Message Given:  Yes - Medicare IM     Johnell Comings 10/29/2022, 10:27 AM

## 2022-10-29 NOTE — Evaluation (Signed)
Occupational Therapy Evaluation Patient Details Name: Brenda [name] MRN: [id number] DOB: Apr 11, 1970 Today's Date: 10/29/2022   History of Present Illness Pt admitted for SOB symptoms. History includes anxiety, asthma, COPD, bipolar, polysubstance abuse, hep C, and CHF.   Clinical Impression   Patient received for OT evaluation. See flowsheet below for details of function. Generally, patient MOD (I) for bed mobility, set up assist with RW and OT managing tubes/lines for functional mobility, and overall set up-MIN A for ADLs. Patient will benefit from continued OT while in acute care.       If plan is discharge home, recommend the following: A little help with bathing/dressing/bathroom;Assistance with cooking/housework;Assist for transportation;Help with stairs or ramp for entrance    Functional Status Assessment  Patient has had a recent decline in their functional status and demonstrates the ability to make significant improvements in function in a reasonable and predictable amount of time.  Equipment Recommendations  None recommended by OT    Recommendations for Other Services       Precautions / Restrictions Precautions Precautions: Fall Restrictions Weight Bearing Restrictions: No      Mobility Bed Mobility Overal bed mobility: Independent                  Transfers Overall transfer level: Modified independent Equipment used: Rolling walker (2 wheels)                      Balance Overall balance assessment: History of Falls, Needs assistance Sitting-balance support: Feet supported Sitting balance-Leahy Scale: Good     Standing balance support: Bilateral upper extremity supported Standing balance-Leahy Scale: Good                             ADL either performed or assessed with clinical judgement   ADL Overall ADL's : Needs assistance/impaired         Upper Body Bathing: Sitting;Minimal assistance (assist for washing back;  discussed recommendation for long-handled sponge with pt)   Lower Body Bathing: Set up;Sitting/lateral leans Lower Body Bathing Details (indicate cue type and reason): pt placing foot on RW bar to raise leg; pt leaning forward, needing rest breaks for shortness of breath when leaning forward. Upper Body Dressing : Set up;Sitting Upper Body Dressing Details (indicate cue type and reason): changed gown Lower Body Dressing: Minimal assistance Lower Body Dressing Details (indicate cue type and reason): OT assisted in donning BIL socks; anticipate pt to be able to do all other LB dressing tasks without assist Toilet Transfer: Modified Independent;Rolling walker (2 wheels) (with grab bar)   Toileting- Clothing Manipulation and Hygiene: Modified independent;Sitting/lateral lean       Functional mobility during ADLs: Rolling walker (2 wheels);Set up (OT assisting with lines/leads/tubes during session) General ADL Comments: Pt bathed while seated on toilet during session; needing rest breaks throughout; pursed lip breathing education provided.     Vision Patient Visual Report: No change from baseline       Perception         Praxis         Pertinent Vitals/Pain Pain Assessment Pain Assessment: No/denies pain     Extremity/Trunk Assessment Upper Extremity Assessment Upper Extremity Assessment: Overall WFL for tasks assessed   Lower Extremity Assessment Lower Extremity Assessment: Overall WFL for tasks assessed       Communication Communication Communication: No apparent difficulties   Cognition Arousal: Alert Behavior During Therapy: [facility] for tasks  assessed/performed Overall Cognitive Status: Within Functional Limits for tasks assessed                                 General Comments: alert and pleasant, agreeable to session     General Comments  Pt on 7L O2 throughout session. 94% saturation at rest; 91% saturation while seated on toilet and bathing.     Exercises     Shoulder Instructions      Home Living Family/patient expects to be discharged to:: Private residence Living Arrangements: Children (two adult children in their 20's live with her, and one adult child 64 y/o lives down the street) Available Help at Discharge: Family;Available 24 hours/day Type of Home: Mobile home Home Access: Stairs to enter Entrance Stairs-Number of Steps: 3 Entrance Stairs-Rails: Can reach both Home Layout: One level     Bathroom Shower/Tub: Chief Strategy Officer: Standard     Home Equipment: Rollator (4 wheels);Shower seat   Additional Comments: Pt not on home O2; does own a pulse oximeter that she purchased recently; does not check O2 at home regularly but states "I bet I needed some oxygen at home too".      Prior Functioning/Environment Prior Level of Function : Driving;History of Falls (last six months);Independent/Modified Independent             Mobility Comments: reports she uses rollator for household ambulation and scooters/wheelchairs for community distance- however does not own WC at this time. Reports 1 recent fall in shower ADLs Comments: MOD (I) BADLs; uses shower chair. Pt has urinary leaking at baseline; wears briefs at home. Pt drives sometimes, or her son will drive. Daughter and son-in-law do not drive; pt drives them to work. Adult children assist with groceries and other IADLs as needed.        OT Problem List: Decreased activity tolerance;Other (comment) (new oxygen requirement)      OT Treatment/Interventions: Self-care/ADL training;Therapeutic exercise;Therapeutic activities;Patient/family education    OT Goals(Current goals can be found in the care plan section) Acute Rehab OT Goals Patient Stated Goal: Breathing to improve OT Goal Formulation: With patient Time For Goal Achievement: 11/12/22 Potential to Achieve Goals: Good ADL Goals Pt Will Perform Tub/Shower Transfer: with modified  independence;shower seat;Tub transfer Additional ADL Goal #1: Pt will demonstrate daily ADL routine MOD (I) with self-management of O2 tubing during mobility and ADLs.  OT Frequency: Min 1X/week    Co-evaluation              AM-PAC OT "6 Clicks" Daily Activity     Outcome Measure Help from another person eating meals?: None Help from another person taking care of personal grooming?: A Little Help from another person toileting, which includes using toliet, bedpan, or urinal?: None Help from another person bathing (including washing, rinsing, drying)?: None Help from another person to put on and taking off regular upper body clothing?: None Help from another person to put on and taking off regular lower body clothing?: A Little 6 Click Score: 22   End of Session Equipment Utilized During Treatment: Rolling walker (2 wheels);Oxygen Nurse Communication: Mobility status (O2 sats; need for new purewick; ADL status)  Activity Tolerance: Patient tolerated treatment well;Patient limited by fatigue;Other (comment) (limited by shortness of breath) Patient left: in bed;with call bell/phone within reach;with nursing/sitter in room (RN in room to place purewick)  OT Visit Diagnosis: Other (comment) (Decreased activity tolerance from shortness  of breath)                Time: 1610-9604 OT Time Calculation (min): 32 min Charges:  OT General Charges $OT Visit: 1 Visit OT Evaluation $OT Eval Moderate Complexity: 1 Mod OT Treatments $Self Care/Home Management : 8-22 mins  Linward Foster, MS, OTR/L  Alvester Morin 10/29/2022, 3:59 PM

## 2022-10-29 NOTE — Plan of Care (Signed)

## 2022-10-29 NOTE — Evaluation (Signed)
Physical Therapy Evaluation Patient Details Name: KONYA SCHILD MRN: [id number] DOB: [birth year] Today's Date: 10/29/2022  History of Present Illness  Pt admitted for SOB symptoms. History includes anxiety, asthma, COPD, bipolar, polysubstance abuse, hep C, and CHF.  Clinical Impression  Pt is a pleasant 52 year old female who was admitted for SOB symptoms. Pt performs bed mobility with indep, transfers with mod I, and ambulation with CGA and RW. Pt demonstrates deficits with endurance/mobility. Would benefit from skilled PT to address above deficits and promote optimal return to PLOF. Pt will continue to receive skilled PT services while admitted and will defer to TOC/care team for updates regarding disposition planning.  SaO2 on room air at rest = 86% SaO2 on room air while ambulating = n/a% SaO2 on 4 liters of O2 while ambulating = 83% SaO2 on 5 liters of O2 while ambulating = 95%        If plan is discharge home, recommend the following: A little help with walking and/or transfers;Help with stairs or ramp for entrance   Can travel by private vehicle        Equipment Recommendations Rolling walker (2 wheels)  Recommendations for Other Services       Functional Status Assessment Patient has had a recent decline in their functional status and demonstrates the ability to make significant improvements in function in a reasonable and predictable amount of time.     Precautions / Restrictions Precautions Precautions: Fall Restrictions Weight Bearing Restrictions: No      Mobility  Bed Mobility Overal bed mobility: Independent             General bed mobility comments: safe technique with ease of mobility, however fatigues quickly with quick SOB symptoms.    Transfers Overall transfer level: Modified independent Equipment used: Rolling walker (2 wheels)               General transfer comment: transfers performed with safe technique. Once standing, brief  donned secondary to stress incontience    Ambulation/Gait Ambulation/Gait assistance: Contact guard assist Gait Distance (Feet): 20 Feet Assistive device: Rolling walker (2 wheels) Gait Pattern/deviations: Step-through pattern       General Gait Details: ambulated short distance in room due to SOB symptoms. Labored breathing with sats decreasing to 83% on 4L of O2. Cues for pursed lip breathing. Safe technique  Stairs            Wheelchair Mobility     Tilt Bed    Modified Rankin (Stroke Patients Only)       Balance Overall balance assessment: History of Falls, Needs assistance Sitting-balance support: Feet supported Sitting balance-Leahy Scale: Good     Standing balance support: Bilateral upper extremity supported Standing balance-Leahy Scale: Good                               Pertinent Vitals/Pain Pain Assessment Pain Assessment: No/denies pain    Home Living Family/patient expects to be discharged to:: Private residence Living Arrangements: Children Available Help at Discharge: Family;Available 24 hours/day Type of Home: Mobile home Home Access: Stairs to enter Entrance Stairs-Rails: Can reach both Entrance Stairs-Number of Steps: 3   Home Layout: One level Home Equipment: Rollator (4 wheels);Shower seat      Prior Function Prior Level of Function : Driving;History of Falls (last six months);Independent/Modified Independent             Mobility Comments: reports she uses  rollator for household ambulation and scooters/wheelchairs for community distance- however does not own WC at this time. Reports 1 recent fall in shower ADLs Comments: uses Shower chair     Extremity/Trunk Assessment   Upper Extremity Assessment Upper Extremity Assessment: Overall WFL for tasks assessed    Lower Extremity Assessment Lower Extremity Assessment: Overall WFL for tasks assessed       Communication   Communication Communication: No apparent  difficulties  Cognition Arousal: Alert Behavior During Therapy: WFL for tasks assessed/performed Overall Cognitive Status: Within Functional Limits for tasks assessed                                 General Comments: alert and pleasant, agreeable to session        General Comments      Exercises     Assessment/Plan    PT Assessment Patient needs continued PT services  PT Problem List Decreased balance;Decreased mobility;Cardiopulmonary status limiting activity;Obesity       PT Treatment Interventions DME instruction;Gait training;Stair training;Therapeutic exercise;Balance training    PT Goals (Current goals can be found in the Care Plan section)  Acute Rehab PT Goals Patient Stated Goal: to go home PT Goal Formulation: With patient Time For Goal Achievement: 11/12/22 Potential to Achieve Goals: Good    Frequency Min 1X/week     Co-evaluation               AM-PAC PT "6 Clicks" Mobility  Outcome Measure Help needed turning from your back to your side while in a flat bed without using bedrails?: None Help needed moving from lying on your back to sitting on the side of a flat bed without using bedrails?: None Help needed moving to and from a bed to a chair (including a wheelchair)?: A Little Help needed standing up from a chair using your arms (e.g., wheelchair or bedside chair)?: A Little Help needed to walk in hospital room?: A Little Help needed climbing 3-5 steps with a railing? : A Little 6 Click Score: 20    End of Session Equipment Utilized During Treatment: Oxygen Activity Tolerance: Patient tolerated treatment well Patient left: in chair;with chair alarm set Nurse Communication: Mobility status PT Visit Diagnosis: Difficulty in walking, not elsewhere classified (R26.2)    Time: 1610-9604 PT Time Calculation (min) (ACUTE ONLY): 23 min   Charges:   PT Evaluation $PT Eval Low Complexity: 1 Low PT Treatments $Gait Training: 8-22  mins PT General Charges $$ ACUTE PT VISIT: 1 Visit         Elizabeth Palau, PT, DPT, GCS [phone]   Jkayla Spiewak 10/29/2022, 3:17 PM

## 2022-10-29 NOTE — Hospital Course (Addendum)
Brenda [name] is a 52 y.o. female with medical history significant for Patient is a 52 year old femalemedical history significant of polysubstance abuse, IV drug user, hypertension, COPD, asthma, GERD, depression, anxiety, HCV, bipolar, alcohol abuse, tobacco abuse, dCHF, discitis of T-spine coming today for shortness of breath and chest pain.  Initial presentation with O2 sats of 86% on room air.  Her symptoms started a week ago before admission to the hospital.  COVID-negative, chest CT scan showed diffuse bilateral infiltrates. Patient started on IV steroids, then added IV Lasix. Patient condition finally improved, she is off oxygen, no shortness of breath.  Obtain home oxygen evaluation if patient need oxygen with ambulation.  Follow-up with pulmonology and PCP as outpatient.

## 2022-10-29 NOTE — Consult Note (Signed)
PULMONOLOGY         Date: 10/29/2022,   MRN# [id number] Brenda [name] [birth year]     AdmissionWeight: 131.5 kg                 CurrentWeight: 131.5 kg  Referring provider: Dr Chipper Herb   CHIEF COMPLAINT:   Refractory pneumonia   HISTORY OF PRESENT ILLNESS   This is a 52 yo F with hx of anxiety, Asthma and COPD overlap, Bipolar depression, OA, chronic hepatitis C, polysubstance abuse, hx of pneumonia in the past, congestive heart failure who came in with acute on chronic hypoxemic respiratory failure. She was noted to have tachypnea on presentation to ER with hypoxemia and was diagnosed with community acquired pneumonia. She has been treated with antibiotics and has not had much improvement. She had CTPE performed with findings of dffuse bilateral infiltrates.    PAST MEDICAL HISTORY   Past Medical History:  Diagnosis Date   Ankle fracture, left    in past.   Anxiety    Arthritis    Right hip, scheduled for replacement 7/13/27m, left ankle   Asthma    Bipolar disorder (HCC)    Chronic hepatitis C (HCC)    COPD (chronic obstructive pulmonary disease) (HCC)    Depression    Dyspnea    occasional with exertion   GERD (gastroesophageal reflux disease)    pmh   Headache    Hearing loss    some loss in both ears - no dx - per patient "runs in family", no hearing aids   Hypertension    Lump in female breast    right   Pneumonia    x 1   Polysubstance abuse (HCC)    Primary localized osteoarthritis of hip    right   SVD (spontaneous vaginal delivery)    x 3   Uses roller walker    occasional   Wears dentures    full upper     SURGICAL HISTORY   Past Surgical History:  Procedure Laterality Date   TOTAL HIP ARTHROPLASTY Right 09/13/2018   TOTAL HIP ARTHROPLASTY Right 09/13/2018   Procedure: RIGHT TOTAL HIP ARTHROPLASTY ANTERIOR APPROACH;  Surgeon: Tarry Kos, MD;  Location: [facility] OR;  Service: Orthopedics;  Laterality: Right;   TUBAL LIGATION        FAMILY HISTORY   Family History  Problem Relation Age of Onset   Ovarian cancer Neg Hx    Colon cancer Neg Hx    Breast cancer Neg Hx      SOCIAL HISTORY   Social History   Tobacco Use   Smoking status: Every Day    Current packs/day: 1.00    Average packs/day: 1 pack/day for 31.0 years (31.0 ttl pk-yrs)    Types: Cigarettes   Smokeless tobacco: Never   Tobacco comments:    since age 78  Vaping Use   Vaping status: Never Used  Substance Use Topics   Alcohol use: Yes    Comment: rare   Drug use: Not Currently    Frequency: 7.0 times per week    Types: Cocaine, Heroin    Comment: uses heroin for pain 09/03/18. Last cocaine 08/02/18     MEDICATIONS    Home Medication:    Current Medication:  Current Facility-Administered Medications:    acetaminophen (TYLENOL) tablet 650 mg, 650 mg, Oral, Q6H PRN, Wouk, Wilfred Curtis, MD, 650 mg at 10/29/22 1003   albuterol (PROVENTIL) (2.5 MG/3ML) 0.083% nebulizer solution  2.5 mg, 2.5 mg, Inhalation, Q2H PRN, Wouk, Wilfred Curtis, MD   aspirin EC tablet 81 mg, 81 mg, Oral, Daily, Gertha Calkin, MD, 81 mg at 10/29/22 4259   atorvastatin (LIPITOR) tablet 40 mg, 40 mg, Oral, Daily, Gertha Calkin, MD, 40 mg at 10/29/22 0809   baclofen (LIORESAL) tablet 10 mg, 10 mg, Oral, TID, Andris Baumann, MD, 10 mg at 10/29/22 0810   ceFEPIme (MAXIPIME) 2 g in sodium chloride 0.9 % 100 mL IVPB, 2 g, Intravenous, Q8H, Wouk, Wilfred Curtis, MD, Last Rate: 200 mL/hr at 10/29/22 0834, 2 g at 10/29/22 0834   enoxaparin (LOVENOX) injection 65 mg, 0.5 mg/kg, Subcutaneous, Q24H, Wouk, Wilfred Curtis, MD, 65 mg at 10/29/22 0809   FLUoxetine (PROZAC) capsule 20 mg, 20 mg, Oral, Daily, Irena Cords V, MD, 20 mg at 10/29/22 0809   folic acid (FOLVITE) tablet 1 mg, 1 mg, Oral, Daily, Irena Cords V, MD, 1 mg at 10/29/22 0809   hydrocortisone cream 1 % 1 Application, 1 Application, Topical, TID PRN, Wouk, Wilfred Curtis, MD   ipratropium-albuterol (DUONEB) 0.5-2.5 (3)  MG/3ML nebulizer solution 3 mL, 3 mL, Nebulization, BID, Marrion Coy, MD   methylPREDNISolone sodium succinate (SOLU-MEDROL) 40 mg/mL injection 40 mg, 40 mg, Intravenous, Q12H, Gertha Calkin, MD, 40 mg at 10/29/22 0809   multivitamin with minerals tablet 1 tablet, 1 tablet, Oral, Daily, Gertha Calkin, MD, 1 tablet at 10/29/22 0809   [START ON 10/30/2022] pantoprazole (PROTONIX) EC tablet 80 mg, 80 mg, Oral, Q1200, Marrion Coy, MD   QUEtiapine (SEROQUEL) tablet 50 mg, 50 mg, Oral, QHS, Wouk, Wilfred Curtis, MD   thiamine (VITAMIN B1) injection 100 mg, 100 mg, Intravenous, Q24H, Irena Cords V, MD, 100 mg at 10/29/22 0003   traMADol (ULTRAM) tablet 25 mg, 25 mg, Oral, Q6H PRN, Marrion Coy, MD    ALLERGIES   Flexeril [cyclobenzaprine] and Quetiapine fumarate     REVIEW OF SYSTEMS    Review of Systems:  Gen:  Denies  fever, sweats, chills weigh loss  HEENT: Denies blurred vision, double vision, ear pain, eye pain, hearing loss, nose bleeds, sore throat Cardiac:  No dizziness, chest pain or heaviness, chest tightness,edema Resp:   reports dyspnea chronically  Gi: Denies swallowing difficulty, stomach pain, nausea or vomiting, diarrhea, constipation, bowel incontinence Gu:  Denies bladder incontinence, burning urine Ext:   Denies Joint pain, stiffness or swelling Skin: Denies  skin rash, easy bruising or bleeding or hives Endoc:  Denies polyuria, polydipsia , polyphagia or weight change Psych:   Denies depression, insomnia or hallucinations   Other:  All other systems negative   VS: BP 104/71 (BP Location: Right Arm)   Pulse 78   Temp 97.6 F (36.4 C)   Resp 16   Ht 5\' 2"  (1.575 m)   Wt 131.5 kg   LMP 12/14/2018   SpO2 95%   BMI 53.04 kg/m      PHYSICAL EXAM    GENERAL:NAD, no fevers, chills, no weakness no fatigue HEAD: Normocephalic, atraumatic.  EYES: Pupils equal, round, reactive to light. Extraocular muscles intact. No scleral icterus.  MOUTH: Moist mucosal  membrane. Dentition intact. No abscess noted.  EAR, NOSE, THROAT: Clear without exudates. No external lesions.  NECK: Supple. No thyromegaly. No nodules. No JVD.  PULMONARY: decreased breath sounds with mild rhonchi worse at bases bilaterally.  CARDIOVASCULAR: S1 and S2. Regular rate and rhythm. No murmurs, rubs, or gallops. No edema. Pedal pulses 2+ bilaterally.  GASTROINTESTINAL: Soft, nontender,  nondistended. No masses. Positive bowel sounds. No hepatosplenomegaly.  MUSCULOSKELETAL: No swelling, clubbing, or edema. Range of motion full in all extremities.  NEUROLOGIC: Cranial nerves II through XII are intact. No gross focal neurological deficits. Sensation intact. Reflexes intact.  SKIN: No ulceration, lesions, rashes, or cyanosis. Skin warm and dry. Turgor intact.  PSYCHIATRIC: Mood, affect within normal limits. The patient is awake, alert and oriented x 3. Insight, judgment intact.       IMAGING   CT- with diffuce ground glass infiltrates with peripheral sparing  ASSESSMENT/PLAN   Acute hypoxemic respiratory failure - present on admission  - COVID19 negative  - supplemental O2 during my evaluation 6L/min - will perform infectious workup for pneumonia -Respiratory viral panel-negative -serum fungitell-in process -legionella ab-negative -strep pneumoniae ur AG-negative -Histoplasma Ur Ag- -sputum resp cultures -reviewed pertinent imaging with patient today - CRP -PT/OT for d/c planning  -please encourage patient to use incentive spirometer few times each hour while hospitalized.   -patient with Grade 2 diastolic CHF, will obtain cardiac biomarkers and diurese today and will order repeat TTE            Thank you for allowing me to participate in the care of this patient.   Patient/Family are satisfied with care plan and all questions have been answered.    Provider disclosure: Patient with at least one acute or chronic illness or injury that poses a threat to  life or bodily function and is being managed actively during this encounter.  All of the below services have been performed independently by signing provider:  review of prior documentation from internal and or external health records.  Review of previous and current lab results.  Interview and comprehensive assessment during patient visit today. Review of current and previous chest radiographs/CT scans. Discussion of management and test interpretation with health care team and patient/family.   This document was prepared using Dragon voice recognition software and may include unintentional dictation errors.     Vida Rigger, M.D.  Division of Pulmonary & Critical Care Medicine

## 2022-10-29 NOTE — TOC Progression Note (Signed)
Transition of Care [facility]) - Progression Note    Patient Details  Name: Brenda [name] MRN: [id number] Date of Birth: 12/08/1970  [facility]) CM/SW Contact  Truddie Hidden, RN Phone Number: 10/29/2022, 3:54 PM  Clinical Narrative:    Spoke with patient regarding therapy's recommendation for HHPT. Patient stated she is going to discharge wither her daughter and will have to check to see if daughter is ok with someone coming into the home.    Expected Discharge Plan: Home/Self Care Barriers to Discharge: No Barriers Identified  Expected Discharge Plan and Services                                               Social Determinants of Health (SDOH) Interventions SDOH Screenings   Food Insecurity: No Food Insecurity (10/27/2022)  Housing: Low Risk  (10/27/2022)  Transportation Needs: No Transportation Needs (10/27/2022)  Utilities: Not At Risk (10/27/2022)  Alcohol Screen: Low Risk  (12/28/2016)  Depression (PHQ2-9): Medium Risk (07/13/2018)  Tobacco Use: High Risk (10/27/2022)    Readmission Risk Interventions     No data to display

## 2022-10-30 DIAGNOSIS — J449 Chronic obstructive pulmonary disease, unspecified: Secondary | ICD-10-CM

## 2022-10-30 DIAGNOSIS — J189 Pneumonia, unspecified organism: Secondary | ICD-10-CM | POA: Diagnosis not present

## 2022-10-30 DIAGNOSIS — J9601 Acute respiratory failure with hypoxia: Secondary | ICD-10-CM | POA: Diagnosis not present

## 2022-10-30 LAB — CBC
HCT: 42.8 % (ref 36.0–46.0)
Hemoglobin: 13.2 g/dL (ref 12.0–15.0)
MCH: 27.2 pg (ref 26.0–34.0)
MCHC: 30.8 g/dL (ref 30.0–36.0)
MCV: 88.2 fL (ref 80.0–100.0)
Platelets: 326 10*3/uL (ref 150–400)
RBC: 4.85 MIL/uL (ref 3.87–5.11)
RDW: 17.6 % — ABNORMAL HIGH (ref 11.5–15.5)
WBC: 15.9 10*3/uL — ABNORMAL HIGH (ref 4.0–10.5)
nRBC: 0 % (ref 0.0–0.2)

## 2022-10-30 LAB — BASIC METABOLIC PANEL
Anion gap: 11 (ref 5–15)
BUN: 34 mg/dL — ABNORMAL HIGH (ref 6–20)
CO2: 30 mmol/L (ref 22–32)
Calcium: 8.4 mg/dL — ABNORMAL LOW (ref 8.9–10.3)
Chloride: 96 mmol/L — ABNORMAL LOW (ref 98–111)
Creatinine, Ser: 0.91 mg/dL (ref 0.44–1.00)
GFR, Estimated: >60 mL/min (ref >60)
Glucose, Bld: 133 mg/dL — ABNORMAL HIGH (ref 70–99)
Potassium: 4.4 mmol/L (ref 3.5–5.1)
Sodium: 137 mmol/L (ref 135–145)

## 2022-10-30 LAB — PROCALCITONIN: Procalcitonin: 0.16 ng/mL

## 2022-10-30 LAB — HCV AB W REFLEX TO QUANT PCR: HCV Ab: REACTIVE — AB

## 2022-10-30 LAB — HCV RT-PCR, QUANT (NON-GRAPH): Hepatitis C Quantitation: NOT DETECTED [IU]/mL

## 2022-10-30 MED ORDER — PREDNISONE 50 MG PO TABS
50.0000 mg | ORAL_TABLET | Freq: Every day | ORAL | Status: DC
  Administered 2022-10-31 – 2022-11-01 (×2): 50 mg via ORAL
  Filled 2022-10-30 (×2): qty 1

## 2022-10-30 MED ORDER — AMOXICILLIN-POT CLAVULANATE 875-125 MG PO TABS
1.0000 | ORAL_TABLET | Freq: Two times a day (BID) | ORAL | Status: DC
  Administered 2022-10-30 – 2022-11-01 (×4): 1 via ORAL
  Filled 2022-10-30 (×4): qty 1

## 2022-10-30 MED ORDER — ORAL CARE MOUTH RINSE: 15.0000 mL | OROMUCOSAL | Status: DC | PRN

## 2022-10-30 NOTE — Progress Notes (Signed)
Pt states she is unable to tolerate CPAP therapy, declined for remainder of this admission at this time. Device remains standby and ready for use.

## 2022-10-30 NOTE — Progress Notes (Signed)
PULMONOLOGY         Date: 10/30/2022,   MRN# [id number] Brenda [name] 19-Aug-1970     AdmissionWeight: 131.5 kg                 CurrentWeight: 131.5 kg  Referring provider: Dr Chipper Herb   CHIEF COMPLAINT:   Refractory pneumonia   HISTORY OF PRESENT ILLNESS   This is a 52 yo F with hx of anxiety, Asthma and COPD overlap, Bipolar depression, OA, chronic hepatitis C, polysubstance abuse, hx of pneumonia in the past, congestive heart failure who came in with acute on chronic hypoxemic respiratory failure. She was noted to have tachypnea on presentation to ER with hypoxemia and was diagnosed with community acquired pneumonia. She has been treated with antibiotics and has not had much improvement. She had CTPE performed with findings of dffuse bilateral infiltrates.    10/30/22- patient reports marked improvement She made 2.8L urine and feels less swollen.  She still smokes 1 pack daily and we have encouraged her to please stop as this will continue to exacerbate her cardiac function and respiratory system. She is on steroids , I have reduced to prednisone 50mg .  I have narrowed abx to augmentin.  Her protonix is at 80mg .  PAST MEDICAL HISTORY   Past Medical History:  Diagnosis Date   Ankle fracture, left    in past.   Anxiety    Arthritis    Right hip, scheduled for replacement 7/13/21m, left ankle   Asthma    Bipolar disorder (HCC)    Chronic hepatitis C (HCC)    COPD (chronic obstructive pulmonary disease) (HCC)    Depression    Dyspnea    occasional with exertion   GERD (gastroesophageal reflux disease)    [facility]   Headache    Hearing loss    some loss in both ears - no dx - per patient "runs in family", no hearing aids   Hypertension    Lump in female breast    right   Pneumonia    x 1   Polysubstance abuse (HCC)    Primary localized osteoarthritis of hip    right   SVD (spontaneous vaginal delivery)    x 3   Uses roller walker    occasional   Wears  dentures    full upper     SURGICAL HISTORY   Past Surgical History:  Procedure Laterality Date   TOTAL HIP ARTHROPLASTY Right 09/13/2018   TOTAL HIP ARTHROPLASTY Right 09/13/2018   Procedure: RIGHT TOTAL HIP ARTHROPLASTY ANTERIOR APPROACH;  Surgeon: Tarry Kos, MD;  Location: [facility] OR;  Service: Orthopedics;  Laterality: Right;   TUBAL LIGATION       FAMILY HISTORY   Family History  Problem Relation Age of Onset   Ovarian cancer Neg Hx    Colon cancer Neg Hx    Breast cancer Neg Hx      SOCIAL HISTORY   Social History   Tobacco Use   Smoking status: Every Day    Current packs/day: 1.00    Average packs/day: 1 pack/day for 31.0 years (31.0 ttl pk-yrs)    Types: Cigarettes   Smokeless tobacco: Never   Tobacco comments:    since age 76  Vaping Use   Vaping status: Never Used  Substance Use Topics   Alcohol use: Yes    Comment: rare   Drug use: Not Currently    Frequency: 7.0 times per week  Types: Cocaine, Heroin    Comment: uses heroin for pain 09/03/18. Last cocaine 08/02/18     MEDICATIONS    Home Medication:    Current Medication:  Current Facility-Administered Medications:    acetaminophen (TYLENOL) tablet 650 mg, 650 mg, Oral, Q6H PRN, Wouk, Wilfred Curtis, MD, 650 mg at 10/29/22 1003   albuterol (PROVENTIL) (2.5 MG/3ML) 0.083% nebulizer solution 2.5 mg, 2.5 mg, Inhalation, Q2H PRN, Wouk, Wilfred Curtis, MD   aspirin EC tablet 81 mg, 81 mg, Oral, Daily, Gertha Calkin, MD, 81 mg at 10/30/22 0925   atorvastatin (LIPITOR) tablet 40 mg, 40 mg, Oral, Daily, Gertha Calkin, MD, 40 mg at 10/30/22 6644   baclofen (LIORESAL) tablet 10 mg, 10 mg, Oral, TID, Andris Baumann, MD, 10 mg at 10/30/22 0924   ceFEPIme (MAXIPIME) 2 g in sodium chloride 0.9 % 100 mL IVPB, 2 g, Intravenous, Q8H, Wouk, Wilfred Curtis, MD, Last Rate: 200 mL/hr at 10/30/22 0816, 2 g at 10/30/22 0816   enoxaparin (LOVENOX) injection 65 mg, 0.5 mg/kg, Subcutaneous, Q24H, Wouk, Wilfred Curtis, MD, 65 mg  at 10/30/22 0347   FLUoxetine (PROZAC) capsule 20 mg, 20 mg, Oral, Daily, Irena Cords V, MD, 20 mg at 10/30/22 4259   folic acid (FOLVITE) tablet 1 mg, 1 mg, Oral, Daily, Irena Cords V, MD, 1 mg at 10/30/22 5638   furosemide (LASIX) injection 40 mg, 40 mg, Intravenous, Q12H, Marrion Coy, MD, 40 mg at 10/30/22 1306   hydrocortisone cream 1 % 1 Application, 1 Application, Topical, TID PRN, Wouk, Wilfred Curtis, MD   ibuprofen (ADVIL) tablet 400 mg, 400 mg, Oral, Q8H PRN, Marrion Coy, MD, 400 mg at 10/29/22 1751   ipratropium-albuterol (DUONEB) 0.5-2.5 (3) MG/3ML nebulizer solution 3 mL, 3 mL, Nebulization, BID, Marrion Coy, MD, 3 mL at 10/30/22 0818   methylPREDNISolone sodium succinate (SOLU-MEDROL) 40 mg/mL injection 40 mg, 40 mg, Intravenous, Q12H, Gertha Calkin, MD, 40 mg at 10/30/22 7564   multivitamin with minerals tablet 1 tablet, 1 tablet, Oral, Daily, Gertha Calkin, MD, 1 tablet at 10/30/22 3329   Oral care mouth rinse, 15 mL, Mouth Rinse, PRN, Marrion Coy, MD   pantoprazole (PROTONIX) EC tablet 80 mg, 80 mg, Oral, Q1200, Marrion Coy, MD, 80 mg at 10/30/22 1306   QUEtiapine (SEROQUEL) tablet 50 mg, 50 mg, Oral, QHS, Wouk, Wilfred Curtis, MD, 50 mg at 10/29/22 2239   thiamine (VITAMIN B1) injection 100 mg, 100 mg, Intravenous, Q24H, Irena Cords V, MD, 100 mg at 10/30/22 0006   traMADol (ULTRAM) tablet 50 mg, 50 mg, Oral, Q4H PRN, Marrion Coy, MD, 50 mg at 10/29/22 2239    ALLERGIES   Flexeril [cyclobenzaprine] and Quetiapine fumarate     REVIEW OF SYSTEMS    Review of Systems:  Gen:  Denies  fever, sweats, chills weigh loss  HEENT: Denies blurred vision, double vision, ear pain, eye pain, hearing loss, nose bleeds, sore throat Cardiac:  No dizziness, chest pain or heaviness, chest tightness,edema Resp:   reports dyspnea chronically  Gi: Denies swallowing difficulty, stomach pain, nausea or vomiting, diarrhea, constipation, bowel incontinence Gu:  Denies bladder incontinence,  burning urine Ext:   Denies Joint pain, stiffness or swelling Skin: Denies  skin rash, easy bruising or bleeding or hives Endoc:  Denies polyuria, polydipsia , polyphagia or weight change Psych:   Denies depression, insomnia or hallucinations   Other:  All other systems negative   VS: BP (!) 137/105   Pulse 85   Temp 97.6  F (36.4 C) (Oral)   Resp 12   Ht 5\' 2"  (1.575 m)   Wt 131.5 kg   LMP 12/14/2018   SpO2 90%   BMI 53.04 kg/m      PHYSICAL EXAM    GENERAL:NAD, no fevers, chills, no weakness no fatigue HEAD: Normocephalic, atraumatic.  EYES: Pupils equal, round, reactive to light. Extraocular muscles intact. No scleral icterus.  MOUTH: Moist mucosal membrane. Dentition intact. No abscess noted.  EAR, NOSE, THROAT: Clear without exudates. No external lesions.  NECK: Supple. No thyromegaly. No nodules. No JVD.  PULMONARY: decreased breath sounds with mild rhonchi worse at bases bilaterally.  CARDIOVASCULAR: S1 and S2. Regular rate and rhythm. No murmurs, rubs, or gallops. No edema. Pedal pulses 2+ bilaterally.  GASTROINTESTINAL: Soft, nontender, nondistended. No masses. Positive bowel sounds. No hepatosplenomegaly.  MUSCULOSKELETAL: No swelling, clubbing, or edema. Range of motion full in all extremities.  NEUROLOGIC: Cranial nerves II through XII are intact. No gross focal neurological deficits. Sensation intact. Reflexes intact.  SKIN: No ulceration, lesions, rashes, or cyanosis. Skin warm and dry. Turgor intact.  PSYCHIATRIC: Mood, affect within normal limits. The patient is awake, alert and oriented x 3. Insight, judgment intact.       IMAGING   CT- with diffuce ground glass infiltrates with peripheral sparing  ASSESSMENT/PLAN   Acute hypoxemic respiratory failure - present on admission  - COVID19 negative  - supplemental O2 during my evaluation 6L/min - will perform infectious workup for pneumonia -Respiratory viral panel-negative -serum fungitell-in  process -legionella ab-negative -strep pneumoniae ur AG-negative -Histoplasma Ur Ag- -sputum resp cultures -reviewed pertinent imaging with patient today - CRP -PT/OT for d/c planning  -please encourage patient to use incentive spirometer few times each hour while hospitalized.   -patient with Grade 2 diastolic CHF, will obtain cardiac biomarkers and diurese today and will order repeat TTE            Thank you for allowing me to participate in the care of this patient.   Patient/Family are satisfied with care plan and all questions have been answered.    Provider disclosure: Patient with at least one acute or chronic illness or injury that poses a threat to life or bodily function and is being managed actively during this encounter.  All of the below services have been performed independently by signing provider:  review of prior documentation from internal and or external health records.  Review of previous and current lab results.  Interview and comprehensive assessment during patient visit today. Review of current and previous chest radiographs/CT scans. Discussion of management and test interpretation with health care team and patient/family.   This document was prepared using Dragon voice recognition software and may include unintentional dictation errors.     Vida Rigger, M.D.  Division of Pulmonary & Critical Care Medicine

## 2022-10-30 NOTE — Progress Notes (Addendum)
Progress Note   Patient: Brenda [name]:[id number] DOB: 29-Oct-1970 DOA: 10/26/2022     4 DOS: the patient was seen and examined on 10/30/2022   Brief hospital course: Brenda [name] is a 52 y.o. female with medical history significant for Patient is a 52 year old femalemedical history significant of polysubstance abuse, IV drug user, hypertension, COPD, asthma, GERD, depression, anxiety, HCV, bipolar, alcohol abuse, tobacco abuse, dCHF, discitis of T-spine coming today for shortness of breath and chest pain.  Initial presentation with O2 sats of 86% on room air.  Her symptoms started a week ago before admission to the hospital.  COVID-negative, chest CT scan showed diffuse bilateral infiltrates. Patient started on IV steroids, then added IV Lasix.    Principal Problem:   Shortness of breath Active Problems:   Acute respiratory failure with hypoxia (HCC)   Multifocal pneumonia   Sepsis (HCC)   Chest pain   Tobacco use disorder   HTN (hypertension)   COPD (chronic obstructive pulmonary disease) (HCC)   Cocaine use disorder, moderate, dependence (HCC)   Alcohol abuse   Sleep apnea   GERD (gastroesophageal reflux disease)   Opioid use disorder, moderate, dependence (HCC)   IVDU (intravenous drug user)   Hepatitis C virus infection without hepatic coma   Morbid obesity (HCC)   Assessment and Plan: Acute hypoxemic respiratory failure secondary to bilateral pneumonia. Diffuse bilateral pneumonia. Severe sepsis secondary to pneumonia. Patient met sepsis criteria with tachycardia, tachypnea and leukocytosis. With acute respiratory failure, patient in severe sepsis.  I personally reviewed patient's CT scan images, patient had a diffuse bilateral groundglass changes with pneumonia, so far infectious workup has been negative including COVID, strep pneumo and Legionella.  Respiratory panel was also negative. However, patient did not come to the hospital until 7 days of symptom  development, patient still could have had a COVID infection which has became negative in testing. Patient has been treated with IV steroids and IV Lasix.  Condition appears to be improving, she is on 4 L oxygen.  Short of breath is better.  Continue current treatment. Procalcitonin level only 0.16, unlikely bacterial in nature.  But antibiotic still on board.  Morbid obese.  With BMI 58.4. Complicated the patient prognosis, diet exercise.   COPD. Tobacco abuse  Advised to quit smoking, no bronchospasm.   Cocaine abuse. Alcohol abuse. Urine drug screen was negative cocaine. Continue watch for withdrawal, continue thiamine, CIWA protocol.   Obstructive sleep apnea. BiPAP while asleep.        Subjective:  Patient feel much better today, still short of breath with exertion.    Physical Exam: Vitals:   10/30/22 0900 10/30/22 1100 10/30/22 1200 10/30/22 1400  BP: 137/82  (!) 139/91 (!) 137/105  Pulse: 67  87 85  Resp: 13  12   Temp:  97.6 F (36.4 C)    TempSrc:  Oral    SpO2: 94%  93% 90%  Weight:      Height:       General exam: Appears calm and comfortable  Respiratory system: Coarse breathing sounds. Respiratory effort normal. Cardiovascular system: S1 & S2 heard, RRR. No JVD, murmurs, rubs, gallops or clicks. No pedal edema. Gastrointestinal system: Abdomen is nondistended, soft and nontender. No organomegaly or masses felt. Normal bowel sounds heard. Central nervous system: Alert and oriented. No focal neurological deficits. Extremities: Symmetric 5 x 5 power. Skin: No rashes, lesions or ulcers Psychiatry: Judgement and insight appear normal. Mood & affect appropriate.  Data Reviewed:  Lab results reviewed.  Family Communication: None  Disposition: Status is: Inpatient Remains inpatient appropriate because: Severity of disease, IV treatment.     Time spent: 35 minutes  Author: Marrion Coy, MD 10/30/2022 3:23 PM  For on call review  [email or web address].

## 2022-10-30 NOTE — Plan of Care (Signed)
Ambulating well today.  Doing well and maintaining O2 while ambulating.

## 2022-10-30 NOTE — Progress Notes (Signed)
Occupational Therapy Treatment Patient Details Name: Brenda [name] MRN: [id number] DOB: [birth year] Today's Date: 10/30/2022   History of present illness Pt. is a 52 y.o. female who was admitted for SOB symptoms. History includes anxiety, asthma, COPD, bipolar, polysubstance abuse, hep C, and CHF.   OT comments  Pt. reports 9/10 headache pain, and reports waiting for pain medication. Pt. education was provided about A/E use for LE ADLs, energy conservation/work simplification techniques, positioning, and PLB techniques. A visual handout was provided to the Pt. Pt.'s SpO2 83-97% at rest on 4LO2. HR: 68 bpms, BP 104/64. Pt. continues to benefit from OT services for ADL training, A/E training, and pt. education about energy conservation/work simplification, home modification, and DME.       If plan is discharge home, recommend the following:  A little help with bathing/dressing/bathroom;Assistance with cooking/housework;Assist for transportation;Help with stairs or ramp for entrance   Equipment Recommendations       Recommendations for Other Services      Precautions / Restrictions Precautions Precautions: Fall Restrictions Weight Bearing Restrictions: No       Mobility Bed Mobility                    Transfers                         Balance                                           ADL either performed or assessed with clinical judgement   ADL                   Upper Body Dressing : Independent;Set up   Lower Body Dressing: Minimal assistance                      Extremity/Trunk Assessment Upper Extremity Assessment Upper Extremity Assessment: Overall WFL for tasks assessed            Vision       Perception     Praxis      Cognition Arousal: Alert Behavior During Therapy: WFL for tasks assessed/performed Overall Cognitive Status: Within Functional Limits for tasks assessed                                  General Comments: alert and pleasant, agreeable to session        Exercises      Shoulder Instructions       General Comments      Pertinent Vitals/ Pain        9/10  headache pain  Home Living                                          Prior Functioning/Environment              Frequency  Min 1X/week        Progress Toward Goals  OT Goals(current goals can now be found in the care plan section)  Progress towards OT goals: Progressing toward goals  Acute Rehab OT Goals Patient Stated Goal: To feel better OT Goal Formulation: With patient Time For Goal Achievement:  11/12/22 Potential to Achieve Goals: Good  Plan      Co-evaluation                 AM-PAC OT "6 Clicks" Daily Activity     Outcome Measure   Help from another person eating meals?: None Help from another person taking care of personal grooming?: A Little Help from another person toileting, which includes using toliet, bedpan, or urinal?: None Help from another person bathing (including washing, rinsing, drying)?: None Help from another person to put on and taking off regular upper body clothing?: None Help from another person to put on and taking off regular lower body clothing?: A Little 6 Click Score: 22    End of Session Equipment Utilized During Treatment: Oxygen      Activity Tolerance Patient tolerated treatment well;Patient limited by fatigue;Other (comment)   Patient Left in bed;with call bell/phone within reach   Nurse Communication          Time: 1610-9604 OT Time Calculation (min): 22 min  Charges: OT General Charges $OT Visit: 1 Visit OT Treatments $Self Care/Home Management : 8-22 mins  Olegario Messier, MS, OTR/L  Olegario Messier 10/30/2022, 4:41 PM

## 2022-10-31 ENCOUNTER — Inpatient Hospital Stay: Payer: Medicare HMO

## 2022-10-31 DIAGNOSIS — J449 Chronic obstructive pulmonary disease, unspecified: Secondary | ICD-10-CM | POA: Diagnosis not present

## 2022-10-31 DIAGNOSIS — J189 Pneumonia, unspecified organism: Secondary | ICD-10-CM | POA: Diagnosis not present

## 2022-10-31 DIAGNOSIS — J9601 Acute respiratory failure with hypoxia: Secondary | ICD-10-CM | POA: Diagnosis not present

## 2022-10-31 LAB — CULTURE, BLOOD (ROUTINE X 2)
Culture: NO GROWTH
Culture: NO GROWTH
Special Requests: ADEQUATE
Special Requests: ADEQUATE

## 2022-10-31 LAB — CBC
HCT: 44.8 % (ref 36.0–46.0)
Hemoglobin: 14.1 g/dL (ref 12.0–15.0)
MCH: 27.4 pg (ref 26.0–34.0)
MCHC: 31.5 g/dL (ref 30.0–36.0)
MCV: 87 fL (ref 80.0–100.0)
Platelets: 315 10*3/uL (ref 150–400)
RBC: 5.15 MIL/uL — ABNORMAL HIGH (ref 3.87–5.11)
RDW: 17.8 % — ABNORMAL HIGH (ref 11.5–15.5)
WBC: 13.6 10*3/uL — ABNORMAL HIGH (ref 4.0–10.5)
nRBC: 0.1 % (ref 0.0–0.2)

## 2022-10-31 LAB — BASIC METABOLIC PANEL
Anion gap: 12 (ref 5–15)
BUN: 39 mg/dL — ABNORMAL HIGH (ref 6–20)
CO2: 32 mmol/L (ref 22–32)
Calcium: 8.6 mg/dL — ABNORMAL LOW (ref 8.9–10.3)
Chloride: 94 mmol/L — ABNORMAL LOW (ref 98–111)
Creatinine, Ser: 0.98 mg/dL (ref 0.44–1.00)
GFR, Estimated: >60 mL/min (ref >60)
Glucose, Bld: 102 mg/dL — ABNORMAL HIGH (ref 70–99)
Potassium: 3.4 mmol/L — ABNORMAL LOW (ref 3.5–5.1)
Sodium: 138 mmol/L (ref 135–145)

## 2022-10-31 LAB — PROCALCITONIN: Procalcitonin: 0.17 ng/mL

## 2022-10-31 LAB — CRYPTOCOCCUS ANTIGEN, SERUM: Cryptococcus Antigen, Serum: NEGATIVE

## 2022-10-31 MED ORDER — POTASSIUM CHLORIDE CRYS ER 20 MEQ PO TBCR
40.0000 meq | EXTENDED_RELEASE_TABLET | ORAL | Status: AC
  Administered 2022-10-31 (×2): 40 meq via ORAL
  Filled 2022-10-31 (×2): qty 2

## 2022-10-31 NOTE — Plan of Care (Signed)

## 2022-10-31 NOTE — Progress Notes (Signed)
Patient vomited large amount of mucus

## 2022-10-31 NOTE — Progress Notes (Signed)
Patient was asked and prompted several times by nursing staff and student nurse re mobility , patient refused.

## 2022-10-31 NOTE — Progress Notes (Signed)
Progress Note   Patient: Brenda [name] UEA:[id number] DOB: 10/18/1970 DOA: 10/26/2022     5 DOS: the patient was seen and examined on 10/31/2022   Brief hospital course: Brenda [name] is a 53 y.o. female with medical history significant for Patient is a 52 year old femalemedical history significant of polysubstance abuse, IV drug user, hypertension, COPD, asthma, GERD, depression, anxiety, HCV, bipolar, alcohol abuse, tobacco abuse, dCHF, discitis of T-spine coming today for shortness of breath and chest pain.  Initial presentation with O2 sats of 86% on room air.  Her symptoms started a week ago before admission to the hospital.  COVID-negative, chest CT scan showed diffuse bilateral infiltrates. Patient started on IV steroids, then added IV Lasix.    Principal Problem:   Shortness of breath Active Problems:   Acute respiratory failure with hypoxia (HCC)   Multifocal pneumonia   Sepsis (HCC)   Chest pain   Tobacco use disorder   HTN (hypertension)   COPD (chronic obstructive pulmonary disease) (HCC)   Cocaine use disorder, moderate, dependence (HCC)   Alcohol abuse   Sleep apnea   GERD (gastroesophageal reflux disease)   Opioid use disorder, moderate, dependence (HCC)   IVDU (intravenous drug user)   Hypokalemia   Hepatitis C virus infection without hepatic coma   Morbid obesity (HCC)   Assessment and Plan: Acute hypoxemic respiratory failure secondary to bilateral pneumonia. Diffuse bilateral pneumonia. Severe sepsis secondary to pneumonia. Patient met sepsis criteria with tachycardia, tachypnea and leukocytosis. With acute respiratory failure, patient in severe sepsis.  I personally reviewed patient's CT scan images, patient had a diffuse bilateral groundglass changes with pneumonia, so far infectious workup has been negative including COVID, strep pneumo and Legionella.  Respiratory panel was also negative. However, patient did not come to the hospital until 7 days  of symptom development, patient still could have had a COVID infection which has became negative in testing. Patient has been treated with IV steroids and IV Lasix.  Condition appears to be improving, she is on 4 L oxygen.  Short of breath is better.  Continue current treatment. Procalcitonin level only 0.16, unlikely bacterial in nature.  But antibiotic still on board. Patient condition continued to improve, wean oxygen.  Continue current treatment.  Hypokalemia. Replete potassium, recheck levels tomorrow.   Morbid obese.  With BMI 58.4. Complicated the patient prognosis, diet exercise.   COPD. Tobacco abuse  Advised to quit smoking, no bronchospasm.   Cocaine abuse. Alcohol abuse. Urine drug screen was negative cocaine. Continue watch for withdrawal, continue thiamine, CIWA protocol.   Obstructive sleep apnea. BiPAP while asleep.      Subjective:  Patient feel much better today.  No significant short of breath.  Physical Exam: Vitals:   10/31/22 0200 10/31/22 0300 10/31/22 0800 10/31/22 1100  BP: 116/81 115/73 119/60   Pulse: 75 72  75  Resp:  20 14 15   Temp:  97.8 F (36.6 C) 98.2 F (36.8 C) 97.7 F (36.5 C)  TempSrc:  Axillary Oral Oral  SpO2: (!) 73% 95% 94%   Weight:      Height:       General exam: Appears calm and comfortable, morbidly obese. Respiratory system: Clear to auscultation. Respiratory effort normal. Cardiovascular system: S1 & S2 heard, RRR. No JVD, murmurs, rubs, gallops or clicks. No pedal edema. Gastrointestinal system: Abdomen is nondistended, soft and nontender. No organomegaly or masses felt. Normal bowel sounds heard. Central nervous system: Alert and oriented. No focal neurological deficits. Extremities:  Symmetric 5 x 5 power. Skin: No rashes, lesions or ulcers Psychiatry: Judgement and insight appear normal. Mood & affect appropriate.    Data Reviewed:  Lab results reviewed.  Family Communication: None  Disposition: Status  is: Inpatient Remains inpatient appropriate because: Severity of disease, IV treatment.     Time spent: 35 minutes  Author: Marrion Coy, MD 10/31/2022 12:03 PM  For on call review [email or web address].

## 2022-10-31 NOTE — Progress Notes (Signed)
OT Cancellation Note  Patient Details Name: Brenda [name] MRN: [id number] DOB: Sep 28, 1970   Cancelled Treatment:    Reason Eval/Treat Not Completed: Other (comment) (Pt politely declined; states she's waiting on family to bring her briefs at home, then will wash up with nursing staff; OT informed student nurse in room to set up for pt and then allow her to do bathing with standby. No other OT needs at this time.)  Alvester Morin 10/31/2022, 10:28 AM

## 2022-10-31 NOTE — TOC Progression Note (Signed)
Transition of Care [facility]) - Progression Note    Patient Details  Name: Brenda [name] MRN: [id number] Date of Birth: March 26, 1970  [facility] CM/SW Contact  Truddie Hidden, RN Phone Number: 10/31/2022, 3:38 PM  Clinical Narrative:    Spoke with patient regarding discharge plans for [facility]. She does not wish to have HH.    Expected Discharge Plan: Home/Self Care Barriers to Discharge: No Barriers Identified  Expected Discharge Plan and Services                                               Social Determinants of Health (SDOH) Interventions SDOH Screenings   Food Insecurity: No Food Insecurity (10/27/2022)  Housing: Low Risk  (10/27/2022)  Transportation Needs: No Transportation Needs (10/27/2022)  Utilities: Not At Risk (10/27/2022)  Alcohol Screen: Low Risk  (12/28/2016)  Depression (PHQ2-9): Medium Risk (07/13/2018)  Tobacco Use: High Risk (10/27/2022)    Readmission Risk Interventions     No data to display

## 2022-10-31 NOTE — Care Management Important Message (Signed)
Important Message  Patient Details  Name: Brenda [name] MRN: [id number] Date of Birth: [birth year]   Important Message Given:  Yes - Medicare IM     Johnell Comings 10/31/2022, 2:34 PM

## 2022-10-31 NOTE — Progress Notes (Signed)
PULMONOLOGY         Date: 10/31/2022,   MRN# [id number] DIANIA REASON [birth year]     AdmissionWeight: 131.5 kg                 CurrentWeight: 131.5 kg  Referring provider: Dr Chipper Herb   CHIEF COMPLAINT:   Refractory pneumonia   HISTORY OF PRESENT ILLNESS   This is a 52 yo F with hx of anxiety, Asthma and COPD overlap, Bipolar depression, OA, chronic hepatitis C, polysubstance abuse, hx of pneumonia in the past, congestive heart failure who came in with acute on chronic hypoxemic respiratory failure. She was noted to have tachypnea on presentation to ER with hypoxemia and was diagnosed with community acquired pneumonia. She has been treated with antibiotics and has not had much improvement. She had CTPE performed with findings of dffuse bilateral infiltrates.    10/30/22- patient reports marked improvement She made 2.8L urine and feels less swollen.  She still smokes 1 pack daily and we have encouraged her to please stop as this will continue to exacerbate her cardiac function and respiratory system. She is on steroids , I have reduced to prednisone 50mg .  I have narrowed abx to augmentin.  Her protonix is at 80mg .  10/31/22- patient shares she feels better this Am.  She shares that chronic migraines have been an issue over past year and she declined PT/OT because of this reason.  She is >3L net negative on fluid balance.  She has CXR this am for interval changes post diuresis. Mild hypokalemia has been repleted. On augmentin and pred taper  PAST MEDICAL HISTORY   Past Medical History:  Diagnosis Date   Ankle fracture, left    in past.   Anxiety    Arthritis    Right hip, scheduled for replacement 7/13/14m, left ankle   Asthma    Bipolar disorder (HCC)    Chronic hepatitis C (HCC)    COPD (chronic obstructive pulmonary disease) (HCC)    Depression    Dyspnea    occasional with exertion   GERD (gastroesophageal reflux disease)    pmh   Headache    Hearing loss     some loss in both ears - no dx - per patient "runs in family", no hearing aids   Hypertension    Lump in female breast    right   Pneumonia    x 1   Polysubstance abuse (HCC)    Primary localized osteoarthritis of hip    right   SVD (spontaneous vaginal delivery)    x 3   Uses roller walker    occasional   Wears dentures    full upper     SURGICAL HISTORY   Past Surgical History:  Procedure Laterality Date   TOTAL HIP ARTHROPLASTY Right 09/13/2018   TOTAL HIP ARTHROPLASTY Right 09/13/2018   Procedure: RIGHT TOTAL HIP ARTHROPLASTY ANTERIOR APPROACH;  Surgeon: Tarry Kos, MD;  Location: [facility] OR;  Service: Orthopedics;  Laterality: Right;   TUBAL LIGATION       FAMILY HISTORY   Family History  Problem Relation Age of Onset   Ovarian cancer Neg Hx    Colon cancer Neg Hx    Breast cancer Neg Hx      SOCIAL HISTORY   Social History   Tobacco Use   Smoking status: Every Day    Current packs/day: 1.00    Average packs/day: 1 pack/day for 31.0 years (31.0 ttl pk-yrs)  Types: Cigarettes   Smokeless tobacco: Never   Tobacco comments:    since age 29  Vaping Use   Vaping status: Never Used  Substance Use Topics   Alcohol use: Yes    Comment: rare   Drug use: Not Currently    Frequency: 7.0 times per week    Types: Cocaine, Heroin    Comment: uses heroin for pain 09/03/18. Last cocaine 08/02/18     MEDICATIONS    Home Medication:    Current Medication:  Current Facility-Administered Medications:    acetaminophen (TYLENOL) tablet 650 mg, 650 mg, Oral, Q6H PRN, Wouk, Wilfred Curtis, MD, 650 mg at 10/29/22 1003   albuterol (PROVENTIL) (2.5 MG/3ML) 0.083% nebulizer solution 2.5 mg, 2.5 mg, Inhalation, Q2H PRN, Wouk, Wilfred Curtis, MD   amoxicillin-clavulanate (AUGMENTIN) 875-125 MG per tablet 1 tablet, 1 tablet, Oral, Q12H, Vida Rigger, MD, 1 tablet at 10/30/22 2145   aspirin EC tablet 81 mg, 81 mg, Oral, Daily, Gertha Calkin, MD, 81 mg at 10/30/22 0925    atorvastatin (LIPITOR) tablet 40 mg, 40 mg, Oral, Daily, Irena Cords V, MD, 40 mg at 10/30/22 0347   baclofen (LIORESAL) tablet 10 mg, 10 mg, Oral, TID, Andris Baumann, MD, 10 mg at 10/30/22 2145   enoxaparin (LOVENOX) injection 65 mg, 0.5 mg/kg, Subcutaneous, Q24H, Wouk, Wilfred Curtis, MD, 65 mg at 10/30/22 4259   FLUoxetine (PROZAC) capsule 20 mg, 20 mg, Oral, Daily, Irena Cords V, MD, 20 mg at 10/30/22 5638   folic acid (FOLVITE) tablet 1 mg, 1 mg, Oral, Daily, Irena Cords V, MD, 1 mg at 10/30/22 7564   furosemide (LASIX) injection 40 mg, 40 mg, Intravenous, Q12H, Marrion Coy, MD, 40 mg at 10/31/22 0118   hydrocortisone cream 1 % 1 Application, 1 Application, Topical, TID PRN, Wouk, Wilfred Curtis, MD   ibuprofen (ADVIL) tablet 400 mg, 400 mg, Oral, Q8H PRN, Marrion Coy, MD, 400 mg at 10/29/22 1751   ipratropium-albuterol (DUONEB) 0.5-2.5 (3) MG/3ML nebulizer solution 3 mL, 3 mL, Nebulization, BID, Marrion Coy, MD, 3 mL at 10/31/22 0800   multivitamin with minerals tablet 1 tablet, 1 tablet, Oral, Daily, Gertha Calkin, MD, 1 tablet at 10/30/22 3329   Oral care mouth rinse, 15 mL, Mouth Rinse, PRN, Marrion Coy, MD   pantoprazole (PROTONIX) EC tablet 80 mg, 80 mg, Oral, Q1200, Marrion Coy, MD, 80 mg at 10/30/22 1306   potassium chloride SA (KLOR-CON M) CR tablet 40 mEq, 40 mEq, Oral, Q2H, Marrion Coy, MD, 40 mEq at 10/31/22 0816   predniSONE (DELTASONE) tablet 50 mg, 50 mg, Oral, Q breakfast, Vida Rigger, MD, 50 mg at 10/31/22 0816   QUEtiapine (SEROQUEL) tablet 50 mg, 50 mg, Oral, QHS, Wouk, Wilfred Curtis, MD, 50 mg at 10/30/22 2145   thiamine (VITAMIN B1) injection 100 mg, 100 mg, Intravenous, Q24H, Irena Cords V, MD, 100 mg at 10/30/22 2344   traMADol (ULTRAM) tablet 50 mg, 50 mg, Oral, Q4H PRN, Marrion Coy, MD, 50 mg at 10/29/22 2239    ALLERGIES   Flexeril [cyclobenzaprine] and Quetiapine fumarate     REVIEW OF SYSTEMS    Review of Systems:  Gen:  Denies  fever, sweats,  chills weigh loss  HEENT: Denies blurred vision, double vision, ear pain, eye pain, hearing loss, nose bleeds, sore throat Cardiac:  No dizziness, chest pain or heaviness, chest tightness,edema Resp:   reports dyspnea chronically  Gi: Denies swallowing difficulty, stomach pain, nausea or vomiting, diarrhea, constipation, bowel incontinence Gu:  Denies  bladder incontinence, burning urine Ext:   Denies Joint pain, stiffness or swelling Skin: Denies  skin rash, easy bruising or bleeding or hives Endoc:  Denies polyuria, polydipsia , polyphagia or weight change Psych:   Denies depression, insomnia or hallucinations   Other:  All other systems negative   VS: BP 119/60 (BP Location: Right Arm)   Pulse 72   Temp 98.2 F (36.8 C) (Oral)   Resp 14   Ht 5\' 2"  (1.575 m)   Wt 131.5 kg   LMP 12/14/2018   SpO2 94%   BMI 53.04 kg/m      PHYSICAL EXAM    GENERAL:NAD, no fevers, chills, no weakness no fatigue HEAD: Normocephalic, atraumatic.  EYES: Pupils equal, round, reactive to light. Extraocular muscles intact. No scleral icterus.  MOUTH: Moist mucosal membrane. Dentition intact. No abscess noted.  EAR, NOSE, THROAT: Clear without exudates. No external lesions.  NECK: Supple. No thyromegaly. No nodules. No JVD.  PULMONARY: decreased breath sounds with mild rhonchi worse at bases bilaterally.  CARDIOVASCULAR: S1 and S2. Regular rate and rhythm. No murmurs, rubs, or gallops. No edema. Pedal pulses 2+ bilaterally.  GASTROINTESTINAL: Soft, nontender, nondistended. No masses. Positive bowel sounds. No hepatosplenomegaly.  MUSCULOSKELETAL: No swelling, clubbing, or edema. Range of motion full in all extremities.  NEUROLOGIC: Cranial nerves II through XII are intact. No gross focal neurological deficits. Sensation intact. Reflexes intact.  SKIN: No ulceration, lesions, rashes, or cyanosis. Skin warm and dry. Turgor intact.  PSYCHIATRIC: Mood, affect within normal limits. The patient is awake,  alert and oriented x 3. Insight, judgment intact.       IMAGING   CT- with diffuce ground glass infiltrates with peripheral sparing  ASSESSMENT/PLAN   Acute hypoxemic respiratory failure - present on admission  - COVID19 negative  - supplemental O2 during my evaluation 6L/min - will perform infectious workup for pneumonia -Respiratory viral panel-negative -serum fungitell-in process -legionella ab-negative -strep pneumoniae ur AG-negative -Histoplasma Ur Ag- -sputum resp cultures -reviewed pertinent imaging with patient today - CRP -PT/OT for d/c planning  -please encourage patient to use incentive spirometer few times each hour while hospitalized.   -patient with Grade 2 diastolic CHF, will obtain cardiac biomarkers and diurese today and will order repeat TTE            Thank you for allowing me to participate in the care of this patient.   Patient/Family are satisfied with care plan and all questions have been answered.    Provider disclosure: Patient with at least one acute or chronic illness or injury that poses a threat to life or bodily function and is being managed actively during this encounter.  All of the below services have been performed independently by signing provider:  review of prior documentation from internal and or external health records.  Review of previous and current lab results.  Interview and comprehensive assessment during patient visit today. Review of current and previous chest radiographs/CT scans. Discussion of management and test interpretation with health care team and patient/family.   This document was prepared using Dragon voice recognition software and may include unintentional dictation errors.     Vida Rigger, M.D.  Division of Pulmonary & Critical Care Medicine

## 2022-11-01 DIAGNOSIS — J189 Pneumonia, unspecified organism: Secondary | ICD-10-CM | POA: Diagnosis not present

## 2022-11-01 DIAGNOSIS — J9601 Acute respiratory failure with hypoxia: Secondary | ICD-10-CM | POA: Diagnosis not present

## 2022-11-01 LAB — PROCALCITONIN: Procalcitonin: 0.19 ng/mL

## 2022-11-01 LAB — BASIC METABOLIC PANEL
Anion gap: 12 (ref 5–15)
BUN: 35 mg/dL — ABNORMAL HIGH (ref 6–20)
CO2: 31 mmol/L (ref 22–32)
Calcium: 8.1 mg/dL — ABNORMAL LOW (ref 8.9–10.3)
Chloride: 92 mmol/L — ABNORMAL LOW (ref 98–111)
Creatinine, Ser: 0.96 mg/dL (ref 0.44–1.00)
GFR, Estimated: >60 mL/min (ref >60)
Glucose, Bld: 148 mg/dL — ABNORMAL HIGH (ref 70–99)
Potassium: 3.2 mmol/L — ABNORMAL LOW (ref 3.5–5.1)
Sodium: 135 mmol/L (ref 135–145)

## 2022-11-01 LAB — CBC
HCT: 43.7 % (ref 36.0–46.0)
Hemoglobin: 14.1 g/dL (ref 12.0–15.0)
MCH: 27.5 pg (ref 26.0–34.0)
MCHC: 32.3 g/dL (ref 30.0–36.0)
MCV: 85.2 fL (ref 80.0–100.0)
Platelets: 292 10*3/uL (ref 150–400)
RBC: 5.13 MIL/uL — ABNORMAL HIGH (ref 3.87–5.11)
RDW: 17.6 % — ABNORMAL HIGH (ref 11.5–15.5)
WBC: 13.3 10*3/uL — ABNORMAL HIGH (ref 4.0–10.5)
nRBC: 0 % (ref 0.0–0.2)

## 2022-11-01 LAB — MAGNESIUM: Magnesium: 2.5 mg/dL — ABNORMAL HIGH (ref 1.7–2.4)

## 2022-11-01 MED ORDER — PREDNISONE 10 MG PO TABS: ORAL_TABLET | ORAL | 0 refills | Status: AC

## 2022-11-01 MED ORDER — POTASSIUM CHLORIDE CRYS ER 10 MEQ PO TBCR: 10.0000 meq | EXTENDED_RELEASE_TABLET | Freq: Two times a day (BID) | ORAL | 0 refills | Status: AC

## 2022-11-01 MED ORDER — AMOXICILLIN-POT CLAVULANATE 875-125 MG PO TABS: 1.0000 | ORAL_TABLET | Freq: Two times a day (BID) | ORAL | 0 refills | Status: AC

## 2022-11-01 MED ORDER — POTASSIUM CHLORIDE CRYS ER 20 MEQ PO TBCR
40.0000 meq | EXTENDED_RELEASE_TABLET | ORAL | Status: AC
  Administered 2022-11-01 (×2): 40 meq via ORAL
  Filled 2022-11-01 (×2): qty 2

## 2022-11-01 MED ORDER — POTASSIUM CHLORIDE 10 MEQ/100ML IV SOLN
10.0000 meq | Freq: Once | INTRAVENOUS | Status: AC
  Administered 2022-11-01: 10 meq via INTRAVENOUS
  Filled 2022-11-01: qty 100

## 2022-11-01 NOTE — Progress Notes (Signed)
   11/01/22 1040  Assess: MEWS Score  Temp 98.4 F (36.9 C)  BP 127/83  MAP (mmHg) 97  Pulse Rate 97  ECG Heart Rate 97  Resp 20  SpO2 90 % (sitting right after ambulation)  O2 Device Room Air  Assess: MEWS Score  MEWS Temp 0  MEWS Systolic 0  MEWS Pulse 0  MEWS RR 0  MEWS LOC 0  MEWS Score 0  MEWS Score Color Green  Assess: if the MEWS score is Yellow or Red  Were vital signs accurate and taken at a resting state? No, vital signs rechecked  Assess: SIRS CRITERIA  SIRS Temperature  0  SIRS Pulse 1  SIRS Respirations  0  SIRS WBC 0  SIRS Score Sum  1   Heart rate while ambulating 126, oxygen 88 % on RA while ambulating. Vitals signs improved at rest after ambulation.

## 2022-11-01 NOTE — Progress Notes (Signed)
PULMONOLOGY         Date: 11/01/2022,   MRN# [id number] [name] November 07, 1970     AdmissionWeight: 131.5 kg                 CurrentWeight: 131.5 kg  Referring provider: Dr Chipper Herb   CHIEF COMPLAINT:   Refractory pneumonia   HISTORY OF PRESENT ILLNESS   This is a 52 yo F with hx of anxiety, Asthma and COPD overlap, Bipolar depression, OA, chronic hepatitis C, polysubstance abuse, hx of pneumonia in the past, congestive heart failure who came in with acute on chronic hypoxemic respiratory failure. She was noted to have tachypnea on presentation to ER with hypoxemia and was diagnosed with community acquired pneumonia. She has been treated with antibiotics and has not had much improvement. She had CTPE performed with findings of dffuse bilateral infiltrates.   11/01/22- patient improved close to baseline,  CXR with improvement bilaterally.  She is cleared for dc home.  She has been taking O2 off and walking around.  She would like to go home.  She is on antibiotics and prednisone PO.     PAST MEDICAL HISTORY   Past Medical History:  Diagnosis Date   Ankle fracture, left    in past.   Anxiety    Arthritis    Right hip, scheduled for replacement 7/13/36m, left ankle   Asthma    Bipolar disorder (HCC)    Chronic hepatitis C (HCC)    COPD (chronic obstructive pulmonary disease) (HCC)    Depression    Dyspnea    occasional with exertion   GERD (gastroesophageal reflux disease)    pmh   Headache    Hearing loss    some loss in both ears - no dx - per patient "runs in family", no hearing aids   Hypertension    Lump in female breast    right   Pneumonia    x 1   Polysubstance abuse (HCC)    Primary localized osteoarthritis of hip    right   SVD (spontaneous vaginal delivery)    x 3   Uses roller walker    occasional   Wears dentures    full upper     SURGICAL HISTORY   Past Surgical History:  Procedure Laterality Date   TOTAL HIP ARTHROPLASTY  Right 09/13/2018   TOTAL HIP ARTHROPLASTY Right 09/13/2018   Procedure: RIGHT TOTAL HIP ARTHROPLASTY ANTERIOR APPROACH;  Surgeon: Tarry Kos, MD;  Location: [facility] OR;  Service: Orthopedics;  Laterality: Right;   TUBAL LIGATION       FAMILY HISTORY   Family History  Problem Relation Age of Onset   Ovarian cancer Neg Hx    Colon cancer Neg Hx    Breast cancer Neg Hx      SOCIAL HISTORY   Social History   Tobacco Use   Smoking status: Every Day    Current packs/day: 1.00    Average packs/day: 1 pack/day for 31.0 years (31.0 ttl pk-yrs)    Types: Cigarettes   Smokeless tobacco: Never   Tobacco comments:    since age 19  Vaping Use   Vaping status: Never Used  Substance Use Topics   Alcohol use: Yes    Comment: rare   Drug use: Not Currently    Frequency: 7.0 times per week    Types: Cocaine, Heroin    Comment: uses heroin for pain 09/03/18. Last cocaine 08/02/18  MEDICATIONS    Home Medication:    Current Medication:  Current Facility-Administered Medications:    acetaminophen (TYLENOL) tablet 650 mg, 650 mg, Oral, Q6H PRN, Wouk, Wilfred Curtis, MD, 650 mg at 10/29/22 1003   albuterol (PROVENTIL) (2.5 MG/3ML) 0.083% nebulizer solution 2.5 mg, 2.5 mg, Inhalation, Q2H PRN, Wouk, Wilfred Curtis, MD   amoxicillin-clavulanate (AUGMENTIN) 875-125 MG per tablet 1 tablet, 1 tablet, Oral, Q12H, Vida Rigger, MD, 1 tablet at 11/01/22 0947   aspirin EC tablet 81 mg, 81 mg, Oral, Daily, Gertha Calkin, MD, 81 mg at 11/01/22 0947   atorvastatin (LIPITOR) tablet 40 mg, 40 mg, Oral, Daily, Irena Cords V, MD, 40 mg at 11/01/22 0948   baclofen (LIORESAL) tablet 10 mg, 10 mg, Oral, TID, Andris Baumann, MD, 10 mg at 11/01/22 0947   enoxaparin (LOVENOX) injection 65 mg, 0.5 mg/kg, Subcutaneous, Q24H, Wouk, Wilfred Curtis, MD, 65 mg at 11/01/22 0951   FLUoxetine (PROZAC) capsule 20 mg, 20 mg, Oral, Daily, Irena Cords V, MD, 20 mg at 11/01/22 4098   folic acid (FOLVITE) tablet 1 mg, 1 mg,  Oral, Daily, Irena Cords V, MD, 1 mg at 11/01/22 0949   furosemide (LASIX) injection 40 mg, 40 mg, Intravenous, Q12H, Marrion Coy, MD, 40 mg at 11/01/22 0116   hydrocortisone cream 1 % 1 Application, 1 Application, Topical, TID PRN, Wouk, Wilfred Curtis, MD   ibuprofen (ADVIL) tablet 400 mg, 400 mg, Oral, Q8H PRN, Marrion Coy, MD, 400 mg at 10/29/22 1751   ipratropium-albuterol (DUONEB) 0.5-2.5 (3) MG/3ML nebulizer solution 3 mL, 3 mL, Nebulization, BID, Marrion Coy, MD, 3 mL at 11/01/22 0800   multivitamin with minerals tablet 1 tablet, 1 tablet, Oral, Daily, Gertha Calkin, MD, 1 tablet at 11/01/22 1191   Oral care mouth rinse, 15 mL, Mouth Rinse, PRN, Marrion Coy, MD   pantoprazole (PROTONIX) EC tablet 80 mg, 80 mg, Oral, Q1200, Marrion Coy, MD, 80 mg at 10/31/22 1241   potassium chloride 10 mEq in 100 mL IVPB, 10 mEq, Intravenous, Once, Marrion Coy, MD   potassium chloride SA (KLOR-CON M) CR tablet 40 mEq, 40 mEq, Oral, Q2H, Marrion Coy, MD, 40 mEq at 11/01/22 4782   predniSONE (DELTASONE) tablet 50 mg, 50 mg, Oral, Q breakfast, Vida Rigger, MD, 50 mg at 11/01/22 0948   QUEtiapine (SEROQUEL) tablet 50 mg, 50 mg, Oral, QHS, Wouk, Wilfred Curtis, MD, 50 mg at 10/31/22 2127   thiamine (VITAMIN B1) injection 100 mg, 100 mg, Intravenous, Q24H, Irena Cords V, MD, 100 mg at 10/31/22 2332   traMADol (ULTRAM) tablet 50 mg, 50 mg, Oral, Q4H PRN, Marrion Coy, MD, 50 mg at 10/29/22 2239    ALLERGIES   Flexeril [cyclobenzaprine] and Quetiapine fumarate     REVIEW OF SYSTEMS    Review of Systems:  Gen:  Denies  fever, sweats, chills weigh loss  HEENT: Denies blurred vision, double vision, ear pain, eye pain, hearing loss, nose bleeds, sore throat Cardiac:  No dizziness, chest pain or heaviness, chest tightness,edema Resp:   reports dyspnea chronically  Gi: Denies swallowing difficulty, stomach pain, nausea or vomiting, diarrhea, constipation, bowel incontinence Gu:  Denies bladder  incontinence, burning urine Ext:   Denies Joint pain, stiffness or swelling Skin: Denies  skin rash, easy bruising or bleeding or hives Endoc:  Denies polyuria, polydipsia , polyphagia or weight change Psych:   Denies depression, insomnia or hallucinations   Other:  All other systems negative   VS: BP 139/79 (BP Location: Right Arm)  Pulse 100   Temp 97.8 F (36.6 C) (Oral)   Resp (!) 22   Ht 5\' 2"  (1.575 m)   Wt 131.5 kg   LMP 12/14/2018   SpO2 91%   BMI 53.04 kg/m      PHYSICAL EXAM    GENERAL:NAD, no fevers, chills, no weakness no fatigue HEAD: Normocephalic, atraumatic.  EYES: Pupils equal, round, reactive to light. Extraocular muscles intact. No scleral icterus.  MOUTH: Moist mucosal membrane. Dentition intact. No abscess noted.  EAR, NOSE, THROAT: Clear without exudates. No external lesions.  NECK: Supple. No thyromegaly. No nodules. No JVD.  PULMONARY: decreased breath sounds with mild rhonchi worse at bases bilaterally.  CARDIOVASCULAR: S1 and S2. Regular rate and rhythm. No murmurs, rubs, or gallops. No edema. Pedal pulses 2+ bilaterally.  GASTROINTESTINAL: Soft, nontender, nondistended. No masses. Positive bowel sounds. No hepatosplenomegaly.  MUSCULOSKELETAL: No swelling, clubbing, or edema. Range of motion full in all extremities.  NEUROLOGIC: Cranial nerves II through XII are intact. No gross focal neurological deficits. Sensation intact. Reflexes intact.  SKIN: No ulceration, lesions, rashes, or cyanosis. Skin warm and dry. Turgor intact.  PSYCHIATRIC: Mood, affect within normal limits. The patient is awake, alert and oriented x 3. Insight, judgment intact.       IMAGING   CT- with diffuce ground glass infiltrates with peripheral sparing  ASSESSMENT/PLAN   Acute hypoxemic respiratory failure - present on admission  - COVID19 negative  - supplemental O2 during my evaluation 6L/min - will perform infectious workup for pneumonia -Respiratory viral  panel-negative -serum fungitell-in process -legionella ab-negative -strep pneumoniae ur AG-negative -Histoplasma Ur Ag- -sputum resp cultures -reviewed pertinent imaging with patient today - CRP -PT/OT for d/c planning       Thank you for allowing me to participate in the care of this patient.   Patient/Family are satisfied with care plan and all questions have been answered.    Provider disclosure: Patient with at least one acute or chronic illness or injury that poses a threat to life or bodily function and is being managed actively during this encounter.  All of the below services have been performed independently by signing provider:  review of prior documentation from internal and or external health records.  Review of previous and current lab results.  Interview and comprehensive assessment during patient visit today. Review of current and previous chest radiographs/CT scans. Discussion of management and test interpretation with health care team and patient/family.   This document was prepared using Dragon voice recognition software and may include unintentional dictation errors.     Vida Rigger, M.D.  Division of Pulmonary & Critical Care Medicine

## 2022-11-01 NOTE — TOC Transition Note (Addendum)
Transition of Care [name]) - CM/SW Discharge Note   Patient Details  Name: Brenda [name] MRN: [id number] Date of Birth: 09/16/1970  [name] CM/SW Contact:  Brenda Quarry, RN Phone Number: 11/01/2022, 10:59 AM   Clinical Narrative: 11/01/22: Patient was admitted [name] from home on 10/26/22 with CC of SOB and Chest Pain. Sig. PMH for polysubstance abuse, IV drug user, HTN, COPD, asthma, GERD, depression, anxiety, HCV, bipolar, alcohol abuse, tobacco abuse, dCHF, discitis of T-spine per H&P. Initial presentation with O2 sats of 86% on room air per triage and H&P ED notes.   Subsequent Dx  was Sepsis PNA with IV antibiotic regime initiated. PTA patient's PLOF was Independent/Mod Ind for mobility using rollator and scooters/WC or community needs. ONE Recent fall in shower reported in PT notes.   IP TOC consulted and prior CM Assessment note indicated that patient has a front wheel RW and WC at home. Patient declined SA resources. Uses [facility] in [place]. Family will transport her home on discharge.   Discharge plan and follow up instructions:  Declined HH services.  CPAP ordered 10/29/2022: Will need sleep study.  DME: Home oxygen 2L/[place] continuous and portable via [employer] ordered today prior to discharge. Ambulation saturation test ordered and completed.  Insurance: [employer]. Cardiologist: Brenda Odea, MD. PCP: [facility], [name].  Patient to Follow up Instructions per Discharge Provider:  [name] in [place].        [phone] [place], [place] [address]). Pulmonary Specialist Dr. Vida [name] in TWO WEEKS.       [phone] [facility] [name], [place]).  Schedule outpatient sleep study for CPAP order already in chart as of 10/29/22.   Noted Medications Changes on AVS: See Continued/Home Mediations on AVS/DC Summary: To START taking: amoxicillin-clavulanate (AUGMENTIN)  potassium chloride (KLOR-CON M)  predniSONE (DELTASONE): Start taking  Prednisone (Deltasone) on: November 01, 2022. 1132: Update from [employer] for DME home oxygen. Ambulation saturation test needs to be co-signed by provider and they are working on removing a block on patient's account.   145 pm: Contacted [employer] again and was told waiting on provider co-sign of ambulation saturation test, which was co-signed at 1147 am. Informed representative and DME oxygen is now being sent out for delivery. Brenda Cirri RN CM   Brenda Cirri MSN RN CM  Transitions of Care Department [name] [phone] Weekends Only  Final next level of care: Home/Self Care (Declined HH per prior CM notes.) Barriers to Discharge: No Barriers Identified   Patient Goals and CMS Choice      Discharge Placement                         Discharge Plan and Services Additional resources added to the After Visit Summary for                  DME Arranged: Oxygen DME Agency: [name] Date DME Agency Contacted: 11/01/22 Time DME Agency Contacted: 1058 Representative spoke with at DME Agency: [name] Arranged: Patient Refused HH (Declined HH services on discharge from acute care.) HH Agency: NA        Social Determinants of Health (SDOH) Interventions SDOH Screenings   Food Insecurity: No Food Insecurity (10/27/2022)  Housing: Low Risk  (10/27/2022)  Transportation Needs: No Transportation Needs (10/27/2022)  Utilities: Not At Risk (10/27/2022)  Alcohol Screen: Low Risk  (12/28/2016)  Depression (PHQ2-9): Medium Risk (07/13/2018)  Tobacco Use: High Risk (10/27/2022)  Readmission Risk Interventions     No data to display

## 2022-11-01 NOTE — Progress Notes (Signed)
RN made Dr. Chipper Herb aware that patient vomited and had just taken all her meds prior to emesis including first dose of oral potassium. Patient states she didn't throw up any pills. Patient also states she can't tolerate the IV potassium due to burning so it was stopped after decreasing rate several times to see if if would be tolerable for patient. Chipper Herb, MD acknowledged and stated "give the second dose before discharge. She has occasional nausea/vomiting for a long time. OK to discharge today. Just let me know if she needs home O2."

## 2022-11-01 NOTE — Progress Notes (Signed)
Discharge instructions given to and reviewed with patient. Patient verbalized understanding of all discharge instructions including follow up care, new prescriptions and use of home oxygen. Discharged with son driving.

## 2022-11-01 NOTE — Progress Notes (Addendum)
SATURATION QUALIFICATIONS: (This note is used to comply with regulatory documentation for home oxygen)  Patient Saturations on Room Air at Rest = 90%  Patient Saturations on Room Air while Ambulating = 88%  Patient Saturations on 3 Liters of oxygen while Ambulating = 91%  Please briefly explain why patient needs home oxygen:

## 2022-11-01 NOTE — Discharge Summary (Addendum)
Physician Discharge Summary   Patient: Brenda [name] MRN: [id number] DOB: 1970/10/24  Admit date:     10/26/2022  Discharge date: 11/01/22  Discharge Physician: Marrion Coy   PCP: [facility], [facility]   Recommendations at discharge:   Follow-up with PCP in 1 week. Follow-up with pulmonology in 2 weeks. Please schedule outpatient sleep study.  Discharge Diagnoses: Principal Problem:   Shortness of breath Active Problems:   Acute respiratory failure with hypoxia (HCC)   Multifocal pneumonia   Sepsis (HCC)   Chest pain   Tobacco use disorder   HTN (hypertension)   COPD (chronic obstructive pulmonary disease) (HCC)   Cocaine use disorder, moderate, dependence (HCC)   Alcohol abuse   Sleep apnea   GERD (gastroesophageal reflux disease)   Opioid use disorder, moderate, dependence (HCC)   IVDU (intravenous drug user)   Hypokalemia   Hepatitis C virus infection without hepatic coma   Morbid obesity (HCC)  Resolved Problems:   Obesity (BMI 30-39.9)  Hospital Course: ALBA TWEDT is a 52 y.o. female with medical history significant for Patient is a 52 year old femalemedical history significant of polysubstance abuse, IV drug user, hypertension, COPD, asthma, GERD, depression, anxiety, HCV, bipolar, alcohol abuse, tobacco abuse, dCHF, discitis of T-spine coming today for shortness of breath and chest pain.  Initial presentation with O2 sats of 86% on room air.  Her symptoms started a week ago before admission to the hospital.  COVID-negative, chest CT scan showed diffuse bilateral infiltrates. Patient started on IV steroids, then added IV Lasix. Patient condition finally improved, she is off oxygen, no shortness of breath.  Obtain home oxygen evaluation if patient need oxygen with ambulation.  Follow-up with pulmonology and PCP as outpatient.   Assessment and Plan: Acute hypoxemic respiratory failure secondary to bilateral pneumonia. Diffuse bilateral  pneumonia. Severe sepsis secondary to pneumonia. Patient met sepsis criteria with tachycardia, tachypnea and leukocytosis. With acute respiratory failure, patient in severe sepsis.  I personally reviewed patient's CT scan images, patient had a diffuse bilateral groundglass changes with pneumonia, so far infectious workup has been negative including COVID, strep pneumo and Legionella.  Respiratory panel was also negative. However, patient did not come to the hospital until 7 days of symptom development, patient still could have had a COVID infection which has became negative in testing. Patient has been treated with IV steroids and IV Lasix. Procalcitonin level only 0.16, unlikely bacterial in nature.   Patient is seen by pulmonology, condition finally improved.  Patient is off oxygen, short of breath resolved.  Chest x-ray showed improved lungs.  Medically stable for discharge, will continue Augmentin for additional 3 days, continue steroid taper.  Schedule follow-up with PCP and pulmonology.   Hypokalemia. Patient will be treated with additional 80 mEq of KCl orally for potassium 3.2.  Will also continue 20 mEq of potassium daily for 7 days.  Given discontinuation of Lasix, this supplement would be sufficient.   Morbid obese.  With BMI 58.4. Complicated the patient prognosis, diet exercise.   COPD. Tobacco abuse  Advised to quit smoking, no bronchospasm.   Cocaine abuse. Alcohol abuse. Urine drug screen was negative cocaine. Patient never had withdrawal from alcohol.   Obstructive sleep apnea. Need to schedule outpatient sleep study.           Consultants: Pulmonology. Procedures performed: None  Disposition: Home Diet recommendation:  Discharge Diet Orders (From admission, onward)     Start     Ordered   11/01/22 0000  Diet - low sodium heart healthy        11/01/22 1021           Cardiac diet DISCHARGE MEDICATION: Allergies as of 11/01/2022       Reactions    Flexeril [cyclobenzaprine] Other (See Comments)   RLS-like symptoms    Quetiapine Fumarate Other (See Comments)   RLS-like symptoms only at doses greater than 100mg  per patient.        Medication List     TAKE these medications    albuterol 108 (90 Base) MCG/ACT inhaler Commonly known as: ProAir HFA Inhale 2 puffs into the lungs once every 6 (six) hours as needed for wheezing.   amoxicillin-clavulanate 875-125 MG tablet Commonly known as: AUGMENTIN Take 1 tablet by mouth every 12 (twelve) hours for 3 days.   aspirin EC 81 MG tablet Take 1 tablet (81 mg total) by mouth once daily. Swallow whole.   atorvastatin 40 MG tablet Commonly known as: LIPITOR Take 1 tablet (40 mg total) by mouth once daily.   baclofen 10 MG tablet Commonly known as: LIORESAL Take 10 mg by mouth 3 (three) times daily.   esomeprazole 40 MG capsule Commonly known as: NexIUM Take 1 capsule by mouth once a day   FLUoxetine 20 MG capsule Commonly known as: PROzac Take 1 capsule (20 mg total) by mouth once daily.   Multivitamin Womens 50+ Adv Tabs Take 1 tablet by mouth daily.   potassium chloride 10 MEQ tablet Commonly known as: KLOR-CON M Take 1 tablet (10 mEq total) by mouth 2 (two) times daily for 7 days.   predniSONE 10 MG tablet Commonly known as: DELTASONE Take 4 tablets (40 mg total) by mouth daily with breakfast for 3 days, THEN 2 tablets (20 mg total) daily with breakfast for 3 days, THEN 1 tablet (10 mg total) daily with breakfast for 3 days. Start taking on: November 01, 2022   QUEtiapine 50 MG tablet Commonly known as: SEROQUEL Take 1 tablet (50 mg total) by mouth once daily. What changed: how much to take   Spiriva Respimat 1.25 MCG/ACT Aers Generic drug: Tiotropium Bromide Monohydrate INHALE 1 PUFF INTO THE LUNGS ONCE DAILY.        Follow-up Information     [facility] Follow up in 1 week(s).   Contact information: [address] [place]  [address] [phone]         Vida Rigger, MD Follow up in 2 week(s).   Specialty: Pulmonary Disease Contact information: [address] [place] [address] [phone]                Discharge Exam: Ceasar Mons Weights   10/26/22 2024  Weight: 131.5 kg   General exam: Appears calm and comfortable, morbid obese. Respiratory system: Clear to auscultation. Respiratory effort normal. Cardiovascular system: S1 & S2 heard, RRR. No JVD, murmurs, rubs, gallops or clicks. No pedal edema. Gastrointestinal system: Abdomen is nondistended, soft and nontender. No organomegaly or masses felt. Normal bowel sounds heard. Central nervous system: Alert and oriented. No focal neurological deficits. Extremities: Symmetric 5 x 5 power. Skin: No rashes, lesions or ulcers Psychiatry: Judgement and insight appear normal. Mood & affect appropriate.    Condition at discharge: good  The results of significant diagnostics from this hospitalization (including imaging, microbiology, ancillary and laboratory) are listed below for reference.   Imaging Studies: DG Chest Port 1 View  Result Date: 10/31/2022 CLINICAL DATA:  Atelectasis EXAM: PORTABLE CHEST 1 VIEW COMPARISON:  Chest radiograph and CTA chest 10/26/2022 FINDINGS: The heart is enlarged, unchanged. The upper mediastinal contours are stable. Aeration of the lungs overall appears slightly improved compared to the study from 10/26/2022, with decreased opacities bilaterally. There is no pleural effusion or pneumothorax There is no acute osseous abnormality. IMPRESSION: Improved aeration of the lungs since 10/26/2022. Electronically Signed   By: Lesia Hausen M.D.   On: 10/31/2022 12:44   CT Angio Chest PE W and/or Wo Contrast  Result Date: 10/27/2022 CLINICAL DATA:  Positive D-dimer with shortness of breath and chest pain, initial encounter EXAM: CT ANGIOGRAPHY CHEST WITH CONTRAST TECHNIQUE: Multidetector CT imaging of the chest was  performed using the standard protocol during bolus administration of intravenous contrast. Multiplanar CT image reconstructions and MIPs were obtained to evaluate the vascular anatomy. RADIATION DOSE REDUCTION: This exam was performed according to the departmental dose-optimization program which includes automated exposure control, adjustment of the mA and/or kV according to patient size and/or use of iterative reconstruction technique. CONTRAST:  75mL OMNIPAQUE IOHEXOL 350 MG/ML SOLN COMPARISON:  Chest x-ray from earlier in the same day. FINDINGS: Cardiovascular: Thoracic aorta shows a normal branching pattern. Mild atherosclerotic calcifications are noted. No aneurysmal dilatation or dissection is seen. Mild cardiac enlargement is noted. The pulmonary artery shows a normal branching pattern bilaterally. No filling defect to suggest pulmonary embolism is noted. Mediastinum/Nodes: Thoracic inlet is within normal limits. No hilar or mediastinal adenopathy is noted. Scattered small stable likely reactive lymph nodes are seen. The esophagus is within normal limits. Lungs/Pleura: Lungs are well aerated bilaterally. Diffuse predominately ground-glass airspace opacity is noted roughly similar to that seen on the prior exam although slightly improved on the right when compared with the prior study. No sizable effusion is seen. No focal parenchymal nodules are noted. Upper Abdomen: Visualized upper abdomen is within normal limits. Musculoskeletal: Degenerative changes of the thoracic spine are noted. No acute rib abnormality is seen. Review of the MIP images confirms the above findings. IMPRESSION: No evidence of pulmonary emboli. The lungs bilaterally demonstrate diffuse patchy ground-glass opacities slightly improved when compared with the prior exam particularly on the right. Again these changes likely represent multifocal pneumonia/pneumonitis. Aortic Atherosclerosis (ICD10-I70.0). Electronically Signed   By: Alcide Clever M.D.   On: 10/27/2022 00:00   DG Chest Port 1 View  Result Date: 10/26/2022 CLINICAL DATA:  Shortness of breath, chest pain EXAM: PORTABLE CHEST 1 VIEW COMPARISON:  05/31/2021 FINDINGS: Heart is borderline in size. Patchy bilateral airspace opacities, most pronounced in the right lower lobe. No effusions. No acute bony abnormality. IMPRESSION: Patchy bilateral airspace disease with borderline heart size. Findings could reflect edema or infection. Electronically Signed   By: Charlett Nose M.D.   On: 10/26/2022 20:42    Microbiology: Results for orders placed or performed during the hospital encounter of 10/26/22  Blood Culture (routine x 2)     Status: None   Collection Time: 10/26/22  9:26 PM   Specimen: Right Antecubital; Blood  Result Value Ref Range Status   Specimen Description RIGHT ANTECUBITAL  Final   Special Requests   Final    BOTTLES DRAWN AEROBIC AND ANAEROBIC Blood Culture adequate volume   Culture   Final    NO GROWTH 5 DAYS Performed at [facility], [address], [place], [place] [address]    Report Status 10/31/2022 FINAL  Final  Blood Culture (routine x 2)     Status: None   Collection Time: 10/26/22  9:26 PM  Specimen: Left Antecubital; Blood  Result Value Ref Range Status   Specimen Description LEFT ANTECUBITAL  Final   Special Requests   Final    BOTTLES DRAWN AEROBIC AND ANAEROBIC Blood Culture adequate volume   Culture   Final    NO GROWTH 5 DAYS Performed at [facility], [address] [place], [place] [address]    Report Status 10/31/2022 FINAL  Final  SARS Coronavirus 2 by RT PCR (hospital order, performed in [facility] hospital lab) *cepheid single result test* Anterior Nasal Swab     Status: None   Collection Time: 10/26/22  9:27 PM   Specimen: Anterior Nasal Swab  Result Value Ref Range Status   SARS Coronavirus 2 by RT PCR NEGATIVE NEGATIVE Final    Comment: (NOTE) SARS-CoV-2 target nucleic acids are NOT  DETECTED.  The SARS-CoV-2 RNA is generally detectable in upper and lower respiratory specimens during the acute phase of infection. The lowest concentration of SARS-CoV-2 viral copies this assay can detect is 250 copies / mL. A negative result does not preclude SARS-CoV-2 infection and should not be used as the sole basis for treatment or other patient management decisions.  A negative result may occur with improper specimen collection / handling, submission of specimen other than nasopharyngeal swab, presence of viral mutation(s) within the areas targeted by this assay, and inadequate number of viral copies (<250 copies / mL). A negative result must be combined with clinical observations, patient history, and epidemiological information.  Fact Sheet for Patients:   RoadLapTop.co.za  Fact Sheet for Healthcare Providers: http://kim-miller.com/  This test is not yet approved or  cleared by the [place] FDA and has been authorized for detection and/or diagnosis of SARS-CoV-2 by FDA under an Emergency Use Authorization (EUA).  This EUA will remain in effect (meaning this test can be used) for the duration of the COVID-19 declaration under Section 564(b)(1) of the Act, 21 U.S.C. section 360bbb-3(b)(1), unless the authorization is terminated or revoked sooner.  Performed at [facility], [address] [place], [place] [address]   Respiratory (~20 pathogens) panel by PCR     Status: None   Collection Time: 10/27/22 12:42 PM   Specimen: Nasopharyngeal Swab; Respiratory  Result Value Ref Range Status   Adenovirus NOT DETECTED NOT DETECTED Final   Coronavirus 229E NOT DETECTED NOT DETECTED Final    Comment: (NOTE) The Coronavirus on the Respiratory Panel, DOES NOT test for the novel  Coronavirus (2019 nCoV)    Coronavirus HKU1 NOT DETECTED NOT DETECTED Final   Coronavirus NL63 NOT DETECTED NOT DETECTED Final   Coronavirus  OC43 NOT DETECTED NOT DETECTED Final   Metapneumovirus NOT DETECTED NOT DETECTED Final   Rhinovirus / Enterovirus NOT DETECTED NOT DETECTED Final   Influenza A NOT DETECTED NOT DETECTED Final   Influenza B NOT DETECTED NOT DETECTED Final   Parainfluenza Virus 1 NOT DETECTED NOT DETECTED Final   Parainfluenza Virus 2 NOT DETECTED NOT DETECTED Final   Parainfluenza Virus 3 NOT DETECTED NOT DETECTED Final   Parainfluenza Virus 4 NOT DETECTED NOT DETECTED Final   Respiratory Syncytial Virus NOT DETECTED NOT DETECTED Final   Bordetella pertussis NOT DETECTED NOT DETECTED Final   Bordetella Parapertussis NOT DETECTED NOT DETECTED Final   Chlamydophila pneumoniae NOT DETECTED NOT DETECTED Final   Mycoplasma pneumoniae NOT DETECTED NOT DETECTED Final    Comment: Performed at [facility], [address], [place], [place] [address]  MRSA Next Gen by PCR,  Nasal     Status: None   Collection Time: 10/28/22  4:35 AM  Result Value Ref Range Status   MRSA by PCR Next Gen NOT DETECTED NOT DETECTED Final    Comment: (NOTE) The GeneXpert MRSA Assay (FDA approved for NASAL specimens only), is one component of a comprehensive MRSA colonization surveillance program. It is not intended to diagnose MRSA infection nor to guide or monitor treatment for MRSA infections. Test performance is not FDA approved in patients less than 16 years old. Performed at [facility], [address] [place], [place] [address]   Expectorated Sputum Assessment w Gram Stain, Rflx to Resp Cult     Status: None   Collection Time: 10/28/22 10:35 AM   Specimen: Expectorated Sputum  Result Value Ref Range Status   Specimen Description EXPECTORATED SPUTUM  Final   Special Requests NONE  Final   Sputum evaluation   Final    Sputum specimen not acceptable for testing.  Please recollect.   CALLED TO [facility] MCDANIEL 10/28/22 1222 KLW Performed at [facility], [address] [place], [place] [address]     Report Status 10/28/2022 FINAL  Final  Expectorated Sputum Assessment w Gram Stain, Rflx to Resp Cult     Status: None   Collection Time: 10/29/22  6:55 PM   Specimen: Sputum  Result Value Ref Range Status   Specimen Description SPUTUM  Final   Special Requests NONE  Final   Sputum evaluation   Final    THIS SPECIMEN IS ACCEPTABLE FOR SPUTUM CULTURE Performed at [facility], [address], [place], [place] [address]    Report Status 10/29/2022 FINAL  Final  Culture, Respiratory w Gram Stain     Status: None (Preliminary result)   Collection Time: 10/29/22  6:55 PM   Specimen: SPU  Result Value Ref Range Status   Specimen Description   Final    SPUTUM Performed at [facility], [address] [place], [place] [address]    Special Requests   Final    NONE Reflexed from [id number] Performed at [facility], [address] [place], [place] [address]    Gram Stain NO WBC SEEN NO ORGANISMS SEEN   Final   Culture   Final    CULTURE REINCUBATED FOR BETTER GROWTH Performed at [facility], [address], [place], [place] [address]    Report Status PENDING  Incomplete    Labs: CBC: Recent Labs  Lab 10/28/22 0539 10/29/22 0714 10/30/22 0621 10/31/22 0636 11/01/22 0540  WBC 18.6* 17.4* 15.9* 13.6* 13.3*  HGB 12.3 12.0 13.2 14.1 14.1  HCT 39.2 37.5 42.8 44.8 43.7  MCV 89.1 86.6 88.2 87.0 85.2  PLT 278 295 326 315 292   Basic Metabolic Panel: Recent Labs  Lab 10/28/22 0539 10/29/22 0714 10/30/22 0621 10/31/22 0636 11/01/22 0540  NA 137 136 137 138 135  K 4.0 4.8 4.4 3.4* 3.2*  CL 102 103 96* 94* 92*  CO2 27 28 30  32 31  GLUCOSE 134* 145* 133* 102* 148*  BUN 18 28* 34* 39* 35*  CREATININE 0.67 0.80 0.91 0.98 0.96  CALCIUM 8.3* 8.3* 8.4* 8.6* 8.1*  MG  --   --   --   --  2.5*   Liver Function Tests: Recent Labs  Lab 10/26/22 2127 10/27/22 0453  AST 30 25  ALT 20 20  ALKPHOS 127* 117  BILITOT 0.8 0.6  PROT 7.8 7.3   ALBUMIN 3.1*  2.9*   CBG: No results for input(s): "GLUCAP" in the last 168 hours.  Discharge time spent: greater than 30 minutes.  Signed: Marrion Coy, MD [facility] 11/01/2022

## 2022-11-02 LAB — CULTURE, RESPIRATORY W GRAM STAIN
Culture: NORMAL
Gram Stain: NONE SEEN

## 2022-11-03 LAB — FUNGITELL BETA-D-GLUCAN
Fungitell Value:: <31.25 pg/mL
Result Name:: NEGATIVE

## 2022-11-05 LAB — HISTOPLASMA ANTIGEN, URINE: Histoplasma Antigen, urine: NEGATIVE (ref <0.2)
